# Patient Record
Sex: Female | Born: 1955 | Race: Black or African American | Hispanic: No | Marital: Single | Smoking: Never smoker
Health system: Southern US, Community
[De-identification: ages and names within clinical notes are randomized; demographics above are authoritative.]

## PROBLEM LIST (undated history)

## (undated) DIAGNOSIS — D649 Anemia, unspecified: Secondary | ICD-10-CM

## (undated) DIAGNOSIS — I10 Essential (primary) hypertension: Secondary | ICD-10-CM

## (undated) DIAGNOSIS — M8700 Idiopathic aseptic necrosis of unspecified bone: Secondary | ICD-10-CM

## (undated) DIAGNOSIS — Z85528 Personal history of other malignant neoplasm of kidney: Secondary | ICD-10-CM

## (undated) DIAGNOSIS — S301XXA Contusion of abdominal wall, initial encounter: Secondary | ICD-10-CM

## (undated) DIAGNOSIS — M329 Systemic lupus erythematosus, unspecified: Secondary | ICD-10-CM

## (undated) DIAGNOSIS — Z8719 Personal history of other diseases of the digestive system: Secondary | ICD-10-CM

## (undated) DIAGNOSIS — K439 Ventral hernia without obstruction or gangrene: Secondary | ICD-10-CM

## (undated) DIAGNOSIS — C642 Malignant neoplasm of left kidney, except renal pelvis: Secondary | ICD-10-CM

## (undated) DIAGNOSIS — N184 Chronic kidney disease, stage 4 (severe): Secondary | ICD-10-CM

## (undated) HISTORY — PX: SPLENECTOMY: SUR1306

## (undated) HISTORY — PX: MULTIPLE TOOTH EXTRACTIONS: SHX2053

## (undated) HISTORY — PX: NEPHRECTOMY: SHX65

---

## 2013-08-17 DIAGNOSIS — N183 Chronic kidney disease, stage 3 unspecified: Secondary | ICD-10-CM | POA: Diagnosis present

## 2013-09-01 DIAGNOSIS — G894 Chronic pain syndrome: Secondary | ICD-10-CM | POA: Diagnosis present

## 2013-10-30 ENCOUNTER — Encounter (HOSPITAL_BASED_OUTPATIENT_CLINIC_OR_DEPARTMENT_OTHER): Payer: Self-pay | Admitting: Emergency Medicine

## 2013-10-30 ENCOUNTER — Emergency Department (HOSPITAL_BASED_OUTPATIENT_CLINIC_OR_DEPARTMENT_OTHER)
Admission: EM | Admit: 2013-10-30 | Discharge: 2013-10-30 | Disposition: A | Discharge status: Home / Self Care | Payer: Medicare Other | Attending: Emergency Medicine | Admitting: Emergency Medicine

## 2013-10-30 ENCOUNTER — Emergency Department (HOSPITAL_BASED_OUTPATIENT_CLINIC_OR_DEPARTMENT_OTHER): Payer: Medicare Other

## 2013-10-30 DIAGNOSIS — Y929 Unspecified place or not applicable: Secondary | ICD-10-CM | POA: Diagnosis not present

## 2013-10-30 DIAGNOSIS — S46909A Unspecified injury of unspecified muscle, fascia and tendon at shoulder and upper arm level, unspecified arm, initial encounter: Secondary | ICD-10-CM | POA: Diagnosis present

## 2013-10-30 DIAGNOSIS — W010XXA Fall on same level from slipping, tripping and stumbling without subsequent striking against object, initial encounter: Secondary | ICD-10-CM | POA: Insufficient documentation

## 2013-10-30 DIAGNOSIS — Z862 Personal history of diseases of the blood and blood-forming organs and certain disorders involving the immune mechanism: Secondary | ICD-10-CM | POA: Insufficient documentation

## 2013-10-30 DIAGNOSIS — S4980XA Other specified injuries of shoulder and upper arm, unspecified arm, initial encounter: Secondary | ICD-10-CM | POA: Insufficient documentation

## 2013-10-30 DIAGNOSIS — Z8739 Personal history of other diseases of the musculoskeletal system and connective tissue: Secondary | ICD-10-CM | POA: Insufficient documentation

## 2013-10-30 DIAGNOSIS — Z79899 Other long term (current) drug therapy: Secondary | ICD-10-CM | POA: Insufficient documentation

## 2013-10-30 DIAGNOSIS — Y9389 Activity, other specified: Secondary | ICD-10-CM | POA: Diagnosis not present

## 2013-10-30 DIAGNOSIS — S42291A Other displaced fracture of upper end of right humerus, initial encounter for closed fracture: Secondary | ICD-10-CM

## 2013-10-30 DIAGNOSIS — S42293A Other displaced fracture of upper end of unspecified humerus, initial encounter for closed fracture: Secondary | ICD-10-CM | POA: Insufficient documentation

## 2013-10-30 DIAGNOSIS — I10 Essential (primary) hypertension: Secondary | ICD-10-CM | POA: Insufficient documentation

## 2013-10-30 HISTORY — DX: Anemia, unspecified: D64.9

## 2013-10-30 HISTORY — DX: Essential (primary) hypertension: I10

## 2013-10-30 HISTORY — DX: Systemic lupus erythematosus, unspecified: M32.9

## 2013-10-30 MED ORDER — OXYCODONE-ACETAMINOPHEN 5-325 MG PO TABS: 1.0000 | ORAL_TABLET | ORAL | Status: DC | PRN

## 2013-10-30 MED ORDER — OXYCODONE-ACETAMINOPHEN 5-325 MG PO TABS
1.0000 | ORAL_TABLET | Freq: Once | ORAL | Status: AC
  Administered 2013-10-30: 1 via ORAL
  Filled 2013-10-30: qty 1

## 2013-10-30 NOTE — Discharge Instructions (Signed)
Cryotherapy °Cryotherapy means treatment with cold. Ice or gel packs can be used to reduce both pain and swelling. Ice is the most helpful within the first 24 to 48 hours after an injury or flare-up from overusing a muscle or joint. Sprains, strains, spasms, burning pain, shooting pain, and aches can all be eased with ice. Ice can also be used when recovering from surgery. Ice is effective, has very few side effects, and is safe for most people to use. °PRECAUTIONS  °Ice is not a safe treatment option for people with: °· Raynaud phenomenon. This is a condition affecting small blood vessels in the extremities. Exposure to cold may cause your problems to return. °· Cold hypersensitivity. There are many forms of cold hypersensitivity, including: °¨ Cold urticaria. Red, itchy hives appear on the skin when the tissues begin to warm after being iced. °¨ Cold erythema. This is a red, itchy rash caused by exposure to cold. °¨ Cold hemoglobinuria. Red blood cells break down when the tissues begin to warm after being iced. The hemoglobin that carry oxygen are passed into the urine because they cannot combine with blood proteins fast enough. °· Numbness or altered sensitivity in the area being iced. °If you have any of the following conditions, do not use ice until you have discussed cryotherapy with your caregiver: °· Heart conditions, such as arrhythmia, angina, or chronic heart disease. °· High blood pressure. °· Healing wounds or open skin in the area being iced. °· Current infections. °· Rheumatoid arthritis. °· Poor circulation. °· Diabetes. °Ice slows the blood flow in the region it is applied. This is beneficial when trying to stop inflamed tissues from spreading irritating chemicals to surrounding tissues. However, if you expose your skin to cold temperatures for too long or without the proper protection, you can damage your skin or nerves. Watch for signs of skin damage due to cold. °HOME CARE INSTRUCTIONS °Follow  these tips to use ice and cold packs safely. °· Place a dry or damp towel between the ice and skin. A damp towel will cool the skin more quickly, so you may need to shorten the time that the ice is used. °· For a more rapid response, add gentle compression to the ice. °· Ice for no more than 10 to 20 minutes at a time. The bonier the area you are icing, the less time it will take to get the benefits of ice. °· Check your skin after 5 minutes to make sure there are no signs of a poor response to cold or skin damage. °· Rest 20 minutes or more between uses. °· Once your skin is numb, you can end your treatment. You can test numbness by very lightly touching your skin. The touch should be so light that you do not see the skin dimple from the pressure of your fingertip. When using ice, most people will feel these normal sensations in this order: cold, burning, aching, and numbness. °· Do not use ice on someone who cannot communicate their responses to pain, such as small children or people with dementia. °HOW TO MAKE AN ICE PACK °Ice packs are the most common way to use ice therapy. Other methods include ice massage, ice baths, and cryosprays. Muscle creams that cause a cold, tingly feeling do not offer the same benefits that ice offers and should not be used as a substitute unless recommended by your caregiver. °To make an ice pack, do one of the following: °· Place crushed ice or a   bag of frozen vegetables in a sealable plastic bag. Squeeze out the excess air. Place this bag inside another plastic bag. Slide the bag into a pillowcase or place a damp towel between your skin and the bag.  Mix 3 parts water with 1 part rubbing alcohol. Freeze the mixture in a sealable plastic bag. When you remove the mixture from the freezer, it will be slushy. Squeeze out the excess air. Place this bag inside another plastic bag. Slide the bag into a pillowcase or place a damp towel between your skin and the bag. SEEK MEDICAL CARE  IF:  You develop white spots on your skin. This may give the skin a blotchy (mottled) appearance.  Your skin turns blue or pale.  Your skin becomes waxy or hard.  Your swelling gets worse. MAKE SURE YOU:   Understand these instructions.  Will watch your condition.  Will get help right away if you are not doing well or get worse. Document Released: 09/23/2010 Document Revised: 06/13/2013 Document Reviewed: 09/23/2010 [facility] Patient Information 2015 [place], [place]. This information is not intended to replace advice given to you by your health care provider. Make sure you discuss any questions you have with your health care provider. Humerus Fracture, Treated with Immobilization The humerus is the large bone in your upper arm. You have a broken (fractured) humerus. These fractures are easily diagnosed with X-rays. TREATMENT  Simple fractures which will heal without disability are treated with simple immobilization. Immobilization means you will wear a cast, splint, or sling. You have a fracture which will do well with immobilization. The fracture will heal well simply by being held in a good position until it is stable enough to begin range of motion exercises. Do not take part in activities which would further injure your arm.  HOME CARE INSTRUCTIONS   Put ice on the injured area.  Put ice in a plastic bag.  Place a towel between your skin and the bag.  Leave the ice on for 15-20 minutes, 03-04 times a day.  If you have a cast:  Do not scratch the skin under the cast using sharp or pointed objects.  Check the skin around the cast every day. You may put lotion on any red or sore areas.  Keep your cast dry and clean.  If you have a splint:  Wear the splint as directed.  Keep your splint dry and clean.  You may loosen the elastic around the splint if your fingers become numb, tingle, or turn cold or blue.  If you have a sling:  Wear the sling as directed.  Do not put  pressure on any part of your cast or splint until it is fully hardened.  Your cast or splint can be protected during bathing with a plastic bag. Do not lower the cast or splint into water.  Only take over-the-counter or prescription medicines for pain, discomfort, or fever as directed by your caregiver.  Do range of motion exercises as instructed by your caregiver.  Follow up as directed by your caregiver. This is very important in order to avoid permanent injury or disability and chronic pain. SEEK IMMEDIATE MEDICAL CARE IF:   Your skin or nails in the injured arm turn blue or gray.  Your arm feels cold or numb.  You develop severe pain in the injured arm.  You are having problems with the medicines you were given. MAKE SURE YOU:   Understand these instructions.  Will watch your condition.  Will  get help right away if you are not doing well or get worse. Document Released: 05/05/2000 Document Revised: 04/21/2011 Document Reviewed: 03/13/2010 [facility] Patient Information 2015 [place], [place]. This information is not intended to replace advice given to you by your health care provider. Make sure you discuss any questions you have with your health care provider.

## 2013-10-30 NOTE — ED Notes (Addendum)
Patient reports that she tripped in doorway falling onto right side yesterday. Complains of right arm pain from shoulder to hand and right great toe pain

## 2013-10-30 NOTE — ED Provider Notes (Signed)
CSN: [id number]     Arrival date & time 10/30/13  1046 History   First MD Initiated Contact with Patient 10/30/13 1202     Chief Complaint  Patient presents with  . Fall     (Consider location/radiation/quality/duration/timing/severity/associated sxs/prior Treatment) Patient is a 58 y.o. female presenting with fall. The history is provided by the patient. No language interpreter was used.  Fall This is a new problem. The current episode started yesterday. Pertinent negatives include no abdominal pain, chest pain, chills, fever, headaches, nausea, neck pain, vomiting or weakness. Associated symptoms comments: She fell last night after tripping over an object in the floor, landing on her right shoulder. No head injury, neck pain, nausea or vomiting. She denies chest or abdominal pain. She feels sore over hip and knee on right but has been able to ambulate without difficulty. .    Past Medical History  Diagnosis Date  . Lupus   . Anemia   . Hypertension    History reviewed. No pertinent past surgical history. No family history on file. History  Substance Use Topics  . Smoking status: Never Smoker   . Smokeless tobacco: Not on file  . Alcohol Use: Not on file   OB History   Grav Para Term Preterm Abortions TAB SAB Ect Mult Living                 Review of Systems  Constitutional: Negative for fever and chills.  HENT: Negative.   Eyes: Negative for visual disturbance.  Respiratory: Negative.  Negative for shortness of breath.   Cardiovascular: Negative.  Negative for chest pain.  Gastrointestinal: Negative.  Negative for nausea, vomiting and abdominal pain.  Musculoskeletal: Negative for neck pain.       See HPI.  Skin: Negative.  Negative for wound.  Neurological: Negative.  Negative for weakness and headaches.      Allergies  Review of patient's allergies indicates no known allergies.  Home Medications   Prior to Admission medications   Medication Sig Start Date  End Date Taking? Authorizing Provider  oxyCODONE-acetaminophen (PERCOCET/ROXICET) 5-325 MG per tablet Take by mouth every 4 (four) hours as needed for severe pain.   Yes Historical Provider, MD  predniSONE (DELTASONE) 10 MG tablet Take 10 mg by mouth daily with breakfast.   Yes Historical Provider, MD  pregabalin (LYRICA) 150 MG capsule Take 150 mg by mouth 2 (two) times daily.   Yes Historical Provider, MD   BP 114/80  Pulse 110  Temp(Src) 97.9 F (36.6 C) (Oral)  Resp 24  Wt 240 lb (108.863 kg)  SpO2 97% Physical Exam  Constitutional: She is oriented to person, place, and time. She appears well-developed and well-nourished.  HENT:  Head: Normocephalic and atraumatic.  Eyes: Conjunctivae are normal.  Neck: Normal range of motion. Neck supple.  Cardiovascular: Normal rate and intact distal pulses.   Pulmonary/Chest: Effort normal. She exhibits no tenderness.  Abdominal: Soft. There is no tenderness.  Musculoskeletal:  No midline cervical tenderness.  Right shoulder: moderate tenderness without bony deformity that extends to mid upper arm. Elbow nontender, with FROM. Hip: no right hip or pelvic tenderness. FROM of right LE without deformity or bruising.   Neurological: She is alert and oriented to person, place, and time.  Skin: Skin is warm and dry.  Psychiatric: She has a normal mood and affect.    ED Course  Procedures (including critical care time) Labs Review Labs Reviewed - No data to display  Imaging Review  No results found.   EKG Interpretation None     Dg Shoulder Right  10/30/2013   CLINICAL DATA:  Status post fall yesterday now with right-sided shoulder pain  EXAM: RIGHT SHOULDER - 2+ VIEW  COMPARISON:  Right humeral series of today's date  FINDINGS: There is an acute mildly displaced fracture of the greater tuberosity of the humeral head. The glenohumeral joint and AC joint are intact. The observed portions of the scapula, right clavicle, and upper right ribs are  intact.  IMPRESSION: There is an acute mildly displaced fracture of the greater tuberosity of the right humeral head.   Electronically Signed   By: David  Martinique   On: 10/30/2013 13:02   Dg Humerus Right  10/30/2013   CLINICAL DATA:  Status post fall with right shoulder injury and pain radiating down the right humerus  EXAM: RIGHT HUMERUS - 2+ VIEW  COMPARISON:  Right shoulder series of today's date  FINDINGS: The humeral shaft is adequately mineralized. There is a known fracture of the greater tuberosity of the right humerus. The humeral shaft is unremarkable. The observed portions of the elbow are normal. The overlying soft tissues are unremarkable.  IMPRESSION: There is no acute abnormality of the shaft of the right humerus. There is a known fracture of the greater tuberosity.   Electronically Signed   By: David  Martinique   On: 10/30/2013 13:05   MDM   Final diagnoses:  None    1. Humeral head fracture  Fall with pain limited to shoulder with injury. No concern for other acute injury. Shoulder immobilized. Results discussed with patient along with care plan of ortho follow up.         ,  Dewaine Oats, PA-C 11/03/13 [phone]

## 2013-10-31 NOTE — ED Notes (Signed)
Pt called with questions regarding follow up. States she has not seen her orthopedist "in years" and wanted referral. Pt given contact info for Dr Sharol Given who was on call for orthopedics on 10/30/2013

## 2013-11-03 NOTE — ED Provider Notes (Signed)
Medical screening examination/treatment/procedure(s) were performed by non-physician practitioner and as supervising physician I was immediately available for consultation/collaboration.    Dorie Rank, MD 11/03/13 [phone]

## 2014-06-21 DIAGNOSIS — Z9081 Acquired absence of spleen: Secondary | ICD-10-CM | POA: Insufficient documentation

## 2014-06-21 DIAGNOSIS — C642 Malignant neoplasm of left kidney, except renal pelvis: Secondary | ICD-10-CM

## 2014-06-21 HISTORY — DX: Malignant neoplasm of left kidney, except renal pelvis: C64.2

## 2015-03-27 DIAGNOSIS — M329 Systemic lupus erythematosus, unspecified: Secondary | ICD-10-CM | POA: Diagnosis present

## 2015-08-03 DIAGNOSIS — N179 Acute kidney failure, unspecified: Secondary | ICD-10-CM | POA: Diagnosis present

## 2015-08-03 DIAGNOSIS — N183 Chronic kidney disease, stage 3 unspecified: Secondary | ICD-10-CM | POA: Diagnosis present

## 2015-10-13 ENCOUNTER — Emergency Department (HOSPITAL_BASED_OUTPATIENT_CLINIC_OR_DEPARTMENT_OTHER)
Admission: EM | Admit: 2015-10-13 | Discharge: 2015-10-13 | Disposition: A | Discharge status: Home / Self Care | Payer: Medicare HMO | Attending: Emergency Medicine | Admitting: Emergency Medicine

## 2015-10-13 ENCOUNTER — Emergency Department (HOSPITAL_BASED_OUTPATIENT_CLINIC_OR_DEPARTMENT_OTHER): Payer: Medicare HMO

## 2015-10-13 ENCOUNTER — Encounter (HOSPITAL_BASED_OUTPATIENT_CLINIC_OR_DEPARTMENT_OTHER): Payer: Self-pay

## 2015-10-13 DIAGNOSIS — K469 Unspecified abdominal hernia without obstruction or gangrene: Secondary | ICD-10-CM | POA: Diagnosis not present

## 2015-10-13 DIAGNOSIS — I10 Essential (primary) hypertension: Secondary | ICD-10-CM | POA: Insufficient documentation

## 2015-10-13 DIAGNOSIS — K439 Ventral hernia without obstruction or gangrene: Secondary | ICD-10-CM

## 2015-10-13 DIAGNOSIS — R109 Unspecified abdominal pain: Secondary | ICD-10-CM | POA: Diagnosis present

## 2015-10-13 HISTORY — DX: Personal history of other malignant neoplasm of kidney: Z85.528

## 2015-10-13 MED ORDER — OXYCODONE HCL 5 MG PO TABS
10.0000 mg | ORAL_TABLET | Freq: Once | ORAL | Status: AC
Start: 2015-10-13 — End: 2015-10-13
  Administered 2015-10-13: 10 mg via ORAL
  Filled 2015-10-13: qty 2

## 2015-10-13 NOTE — ED Provider Notes (Signed)
[facility] Provider Note   CSN: [id number] Arrival date & time: 10/13/15  1345     History   Chief Complaint Chief Complaint  Patient presents with  . Abdominal Pain    HPI Carolyn Zamora is a 59 y.o. female.  Patient with complicated past medical history including end-stage renal disease on dialysis once a week, left-sided nephrectomy and splenectomy, ventral hernia, lupus, chronic pain, bacteremia -- presents with complaint of swelling noted to the left anterior abdomen over the past 1 week, initially painless, now tender. She also complains of pain on the costal margin below her left breast without significant swelling, redness. Patient denies fever, vomiting, or diarrhea. No chest pain, cough, or shortness of breath. Patient has been using her chronic pain medications at home including oxycodone and Lyrica without relief. Onset of symptoms gradual. Course is worsening. Nothing makes symptoms better. Palpation makes the pain worse.      Past Medical History:  Diagnosis Date  . Anemia   . History of kidney cancer   . Hypertension   . Lupus ([place])     There are no active problems to display for this patient.   Past Surgical History:  Procedure Laterality Date  . MULTIPLE TOOTH EXTRACTIONS    . NEPHRECTOMY      OB History    No data available       Home Medications    Prior to Admission medications   Medication Sig Start Date End Date Taking? Authorizing Provider  oxyCODONE-acetaminophen (PERCOCET/ROXICET) 5-325 MG per tablet Take by mouth every 4 (four) hours as needed for severe pain.    Historical Provider, MD  oxyCODONE-acetaminophen (PERCOCET/ROXICET) 5-325 MG per tablet Take 1-2 tablets by mouth every 4 (four) hours as needed for severe pain. 10/30/13   Charlann Lange, PA-C  predniSONE (DELTASONE) 10 MG tablet Take 10 mg by mouth daily with breakfast.    Historical Provider, MD  pregabalin (LYRICA) 150 MG capsule Take 150 mg by mouth 2 (two)  times daily.    Historical Provider, MD    Family History No family history on file.  Social History Social History  Substance Use Topics  . Smoking status: Never Smoker  . Smokeless tobacco: Never Used  . Alcohol use No     Allergies   Review of patient's allergies indicates no known allergies.   Review of Systems Review of Systems  Constitutional: Negative for fever.  HENT: Negative for rhinorrhea and sore throat.   Eyes: Negative for redness.  Respiratory: Negative for cough.   Cardiovascular: Negative for chest pain.  Gastrointestinal: Positive for abdominal distention. Negative for diarrhea, nausea and vomiting.  Genitourinary: Negative for dysuria.  Musculoskeletal: Negative for myalgias.  Skin: Negative for rash.  Neurological: Negative for headaches.    Physical Exam Updated Vital Signs BP 115/79 (BP Location: Left Arm)   Pulse 94   Temp 98.2 F (36.8 C) (Oral)   Resp 18   Ht 5\' 2"  (1.575 m)   Wt 84.8 kg   SpO2 100%   BMI 34.20 kg/m   Physical Exam  Constitutional: She appears well-developed and well-nourished.  HENT:  Head: Normocephalic and atraumatic.  Eyes: Conjunctivae are normal. Right eye exhibits no discharge. Left eye exhibits no discharge.  Neck: Normal range of motion. Neck supple.  Cardiovascular: Normal rate, regular rhythm and normal heart sounds.   Pulmonary/Chest: Effort normal and breath sounds normal.  Abdominal: Soft. Bowel sounds are normal. There is tenderness in the left upper quadrant.  A hernia is present. Hernia confirmed positive in the ventral area.    Neurological: She is alert.  Skin: Skin is warm and dry.  Psychiatric: She has a normal mood and affect.  Nursing note and vitals reviewed.    ED Treatments / Results   Radiology Ct Abdomen Pelvis Wo Contrast  Result Date: 10/13/2015 CLINICAL DATA:  Painful left lateral ventral mass like area below the left breast. Patient noticed mass 2 days ago. EXAM: CT ABDOMEN AND  PELVIS WITHOUT CONTRAST TECHNIQUE: Multidetector CT imaging of the abdomen and pelvis was performed following the standard protocol without IV contrast. COMPARISON:  05/15/2015 CT FINDINGS: Lower chest: Small left pleural effusion is associated with atelectasis at the lung bases. A right central line tip extends to the right atrium. Hepatobiliary: No focal liver lesions.  The gallbladder is present. Pancreas: Normal in appearance. Spleen: Multiple small splenules identified in the left upper quadrant. Prior splenectomy. Renal/Adrenal: The adrenal glands are normal in appearance. Left kidney is absent. Right kidney is normal in appearance. No hydronephrosis or ureteral obstruction. Gastrointestinal tract: The stomach has a normal appearance. Small bowel loops are normal in appearance. There is a significant amount of stool throughout nondilated loops of colon. Reproductive/Pelvis: The uterus is enlarged and contains calcified fibroids. No adnexal mass. Vascular/Lymphatic: There is calcification of the abdominal aorta. No aneurysm. No retroperitoneal or mesenteric adenopathy. Musculoskeletal/Abdominal wall: There is a left anterior abdominal wall hernia which contains hazy fat and probably small amount of fluid. The herniated fat %period% measures approximately 3.9 x 8.7 x 6.5 cm. Today the herniated sac does not contain bowel loops as on prior studies. There is no associated bowel obstruction. There is 11 mm anterolisthesis L4 on L5. There is a right pars defect at L4. Other: none IMPRESSION: 1. Left anterior abdominal wall hernia containing strandy omental/ mesenteric fat and a small amount of fluid. Infarction of the related fat should be considered given the symptoms of pain. There is no bowel within the hernia on today's exam. 2. Status post splenectomy and left nephrectomy. 3. Small splenules in the left upper quadrant. 4.  Aortic atherosclerosis. 5. Anterolisthesis L4 on L5 with associated pars defect at L4.  Electronically Signed   By: Nolon Nations M.D.   On: 10/13/2015 16:07    Procedures Procedures (including critical care time)  Medications Ordered in ED Medications  oxyCODONE (Oxy IR/ROXICODONE) immediate release tablet 10 mg (10 mg Oral Given 10/13/15 1424)     Initial Impression / Assessment and Plan / ED Course  I have reviewed the triage vital signs and the nursing notes.  Pertinent labs & imaging results that were available during my care of the patient were reviewed by me and considered in my medical decision making (see chart for details).  Clinical Course  Comment By Time  Complicated medical history including lupus, recent nephrectomy within the last year according to the patient as well as a recent admission to the hospital where she was diagnosed with dialysis requiring renal failure. Shortly prior to that admission within the last month and a half she had been admitted to another hospital for the patient describes as bacteremia secondary to a dental infection for which she spent over 6 weeks in the hospital. She developed a small amount of infection in her right arm at her PICC line site which was removed. She has been keeping the stressed since that time. She now reports having some left-sided side pain over her anterior and lateral ribs  as well as her anterior lateral abdomen in the upper left abdomen. On exam the patient does have reproducible tenderness in this area. She has normal lung sounds, normal heart sounds, no tachycardia, otherwise the patient appears to be in no distress. She will need imaging of her abdomen upper abdomen and lower chest with CT scan to further evaluate the source of this pain. She does report that she had a fall however her pain has lasted long enough that this seems unusual and could possibly related to her prior nephrectomy or an infection. Noemi Chapel, MD 09/02 [phone]   Patient seen and examined. Discussed with Dr. Sabra Heck who has seen.    Vital  signs reviewed and are as follows: BP 129/67 (BP Location: Left Arm)   Pulse 75   Temp 98.2 F (36.8 C) (Oral)   Resp 18   Ht 5\' 2"  (1.575 m)   Wt 84.8 kg   SpO2 100%   BMI 34.20 kg/m   CT ordered to evaluate. Patient otherwise appears well.   Discussed CT findings with Dr. Sabra Heck. Pain may be caused by fat necrosis from hernia. No incarceration or strangulation. No other concerning findings in the abdomen.  Patient updated on findings. Will discharge to home and have patient follow-up with her primary care physician in the next week. We discussed signs and symptoms to return including a hard swollen, tender knot in the area of her hernia, vomiting, fevers, new symptoms or other concerns. Patient verbalizes understanding and agrees with plan. She will continue her home on chronic pain medications for treatment. Encouraged warmth on the area as well.     Final Clinical Impressions(s) / ED Diagnoses   Final diagnoses:  Ventral hernia without obstruction or gangrene   Patient with vague, poorly defined abdominal pain and left costal margin pain. Patient does have a reducible hernia demonstrated on CT scan. No incarceration or strength relation. No other intra-abdominal etiology visualized on exam or to imaging. Vital signs are within normal limits. Patient appears well, nontoxic. No further workup indicated. Encouraged PCP follow-up. Return instructions as above.  New Prescriptions New Prescriptions   No medications on file     Carlisle Cater, [place] 10/13/15 1648   Medical screening examination/treatment/procedure(s) were conducted as a shared visit with non-physician practitioner(s) and myself.  I personally evaluated the patient during the encounter.  See my docuemtnation within Mr. Nickola Major note  Clinical Impression:   Final diagnoses:  Ventral hernia without obstruction or gangrene         Noemi Chapel, MD 10/14/15 [phone]

## 2015-10-13 NOTE — ED Triage Notes (Signed)
C/o "knot" to abd x 1-2weeks-NAD-steady gait with own walker

## 2015-10-13 NOTE — Discharge Instructions (Signed)
Please read and follow all provided instructions.  Your diagnoses today include:  1. Ventral hernia without obstruction or gangrene     Tests performed today include:  CT of your abdomen and pelvis - shows hernia with inflamed fat, no infections or blockages  Vital signs. See below for your results today.   Medications prescribed:   None  Take any prescribed medications only as directed.  Home care instructions:  Follow any educational materials contained in this packet.  Use warmth on the area and use home pain medications to help control your symptoms.  Follow-up instructions: Please follow-up with your primary care provider in the next 3 days for further evaluation of your symptoms.   Return instructions:   Please return to the Emergency Department if you experience worsening symptoms.   Return with fever, vomiting, severe abdominal pain, blood passing in your stool, skin redness or warmth  Please return if you have any other emergent concerns.  Additional Information:  Your vital signs today were: BP 115/79 (BP Location: Left Arm)    Pulse 94    Temp 98.2 F (36.8 C) (Oral)    Resp 18    Ht 5\' 2"  (1.575 m)    Wt 84.8 kg    SpO2 100%    BMI 34.20 kg/m  If your blood pressure (BP) was elevated above 135/85 this visit, please have this repeated by your doctor within one month. --------------

## 2015-10-13 NOTE — ED Notes (Signed)
Pt states she forgot to tell triage RN that she is on dialysis.

## 2015-10-24 DIAGNOSIS — K439 Ventral hernia without obstruction or gangrene: Secondary | ICD-10-CM | POA: Insufficient documentation

## 2015-10-24 HISTORY — DX: Ventral hernia without obstruction or gangrene: K43.9

## 2015-11-16 HISTORY — PX: ORIF ANKLE FRACTURE: SUR919

## 2015-11-19 HISTORY — PX: INCISIONAL HERNIA REPAIR: SHX193

## 2015-12-27 ENCOUNTER — Observation Stay (HOSPITAL_COMMUNITY)
Admission: EM | Admit: 2015-12-27 | Discharge: 2015-12-28 | Disposition: A | Discharge status: Home / Self Care | Payer: Medicare HMO | Attending: Internal Medicine | Admitting: Internal Medicine

## 2015-12-27 ENCOUNTER — Encounter (HOSPITAL_COMMUNITY): Payer: Self-pay

## 2015-12-27 DIAGNOSIS — N184 Chronic kidney disease, stage 4 (severe): Secondary | ICD-10-CM

## 2015-12-27 DIAGNOSIS — N183 Chronic kidney disease, stage 3 unspecified: Secondary | ICD-10-CM | POA: Diagnosis present

## 2015-12-27 DIAGNOSIS — E875 Hyperkalemia: Secondary | ICD-10-CM | POA: Diagnosis not present

## 2015-12-27 DIAGNOSIS — N179 Acute kidney failure, unspecified: Secondary | ICD-10-CM | POA: Diagnosis not present

## 2015-12-27 DIAGNOSIS — R799 Abnormal finding of blood chemistry, unspecified: Secondary | ICD-10-CM | POA: Diagnosis present

## 2015-12-27 DIAGNOSIS — Z79899 Other long term (current) drug therapy: Secondary | ICD-10-CM | POA: Insufficient documentation

## 2015-12-27 DIAGNOSIS — M3214 Glomerular disease in systemic lupus erythematosus: Secondary | ICD-10-CM | POA: Diagnosis not present

## 2015-12-27 DIAGNOSIS — Z85528 Personal history of other malignant neoplasm of kidney: Secondary | ICD-10-CM | POA: Insufficient documentation

## 2015-12-27 DIAGNOSIS — I10 Essential (primary) hypertension: Secondary | ICD-10-CM | POA: Diagnosis not present

## 2015-12-27 DIAGNOSIS — R0602 Shortness of breath: Secondary | ICD-10-CM

## 2015-12-27 DIAGNOSIS — I129 Hypertensive chronic kidney disease with stage 1 through stage 4 chronic kidney disease, or unspecified chronic kidney disease: Secondary | ICD-10-CM | POA: Diagnosis not present

## 2015-12-27 DIAGNOSIS — L8990 Pressure ulcer of unspecified site, unspecified stage: Secondary | ICD-10-CM | POA: Insufficient documentation

## 2015-12-27 DIAGNOSIS — M329 Systemic lupus erythematosus, unspecified: Secondary | ICD-10-CM | POA: Diagnosis present

## 2015-12-27 DIAGNOSIS — N390 Urinary tract infection, site not specified: Secondary | ICD-10-CM | POA: Diagnosis present

## 2015-12-27 HISTORY — DX: Chronic kidney disease, stage 4 (severe): N18.4

## 2015-12-27 LAB — URINALYSIS, ROUTINE W REFLEX MICROSCOPIC
BILIRUBIN URINE: NEGATIVE
Glucose, UA: NEGATIVE mg/dL
Ketones, ur: NEGATIVE mg/dL
Nitrite: NEGATIVE
Protein, ur: NEGATIVE mg/dL
SPECIFIC GRAVITY, URINE: 1.008 (ref 1.005–1.030)
pH: 7 (ref 5.0–8.0)

## 2015-12-27 LAB — URINE MICROSCOPIC-ADD ON

## 2015-12-27 LAB — CBC
HCT: 36.9 % (ref 36.0–46.0)
Hemoglobin: 11.7 g/dL — ABNORMAL LOW (ref 12.0–15.0)
MCH: 23.8 pg — ABNORMAL LOW (ref 26.0–34.0)
MCHC: 31.7 g/dL (ref 30.0–36.0)
MCV: 75.2 fL — ABNORMAL LOW (ref 78.0–100.0)
Platelets: 186 10*3/uL (ref 150–400)
RBC: 4.91 MIL/uL (ref 3.87–5.11)
RDW: 17.7 % — ABNORMAL HIGH (ref 11.5–15.5)
WBC: 9.8 10*3/uL (ref 4.0–10.5)

## 2015-12-27 LAB — COMPREHENSIVE METABOLIC PANEL
ALT: 19 U/L (ref 14–54)
AST: 78 U/L — ABNORMAL HIGH (ref 15–41)
Albumin: 2.6 g/dL — ABNORMAL LOW (ref 3.5–5.0)
Alkaline Phosphatase: 66 U/L (ref 38–126)
Anion gap: 13 (ref 5–15)
BUN: 14 mg/dL (ref 6–20)
CO2: 23 mmol/L (ref 22–32)
CREATININE: 3.87 mg/dL — AB (ref 0.44–1.00)
Calcium: 7.4 mg/dL — ABNORMAL LOW (ref 8.9–10.3)
Chloride: 104 mmol/L (ref 101–111)
GFR calc Af Amer: 14 mL/min — ABNORMAL LOW (ref >60)
GFR, EST NON AFRICAN AMERICAN: 12 mL/min — AB (ref >60)
Glucose, Bld: 84 mg/dL (ref 65–99)
Potassium: 6.2 mmol/L — ABNORMAL HIGH (ref 3.5–5.1)
Sodium: 140 mmol/L (ref 135–145)
Total Bilirubin: <0.3 mg/dL — ABNORMAL LOW (ref 0.3–1.2)
Total Protein: 4.3 g/dL — ABNORMAL LOW (ref 6.5–8.1)

## 2015-12-27 LAB — I-STAT TROPONIN, ED: Troponin i, poc: 0.01 ng/mL (ref 0.00–0.08)

## 2015-12-27 MED ORDER — SODIUM CHLORIDE 0.9 % IV BOLUS (SEPSIS)
1000.0000 mL | Freq: Once | INTRAVENOUS | Status: AC
  Administered 2015-12-27: 1000 mL via INTRAVENOUS

## 2015-12-27 MED ORDER — HYDROXYZINE HCL 50 MG/ML IM SOLN: 25.0000 mg | Freq: Four times a day (QID) | INTRAMUSCULAR | Status: DC | PRN

## 2015-12-27 MED ORDER — HYDRALAZINE HCL 20 MG/ML IJ SOLN: 5.0000 mg | INTRAMUSCULAR | Status: DC | PRN

## 2015-12-27 MED ORDER — HYDROCORTISONE NA SUCCINATE PF 100 MG IJ SOLR
50.0000 mg | Freq: Once | INTRAMUSCULAR | Status: AC
  Administered 2015-12-27: 50 mg via INTRAVENOUS
  Filled 2015-12-27: qty 2

## 2015-12-27 MED ORDER — CEFTRIAXONE SODIUM 1 G IJ SOLR
1.0000 g | INTRAMUSCULAR | Status: DC
  Administered 2015-12-27: 1 g via INTRAVENOUS
  Filled 2015-12-27 (×2): qty 10

## 2015-12-27 MED ORDER — PREDNISONE 20 MG PO TABS
10.0000 mg | ORAL_TABLET | Freq: Every day | ORAL | Status: DC
  Administered 2015-12-28: 10 mg via ORAL
  Filled 2015-12-27: qty 1

## 2015-12-27 MED ORDER — HEPARIN SODIUM (PORCINE) 5000 UNIT/ML IJ SOLN
5000.0000 [IU] | Freq: Three times a day (TID) | INTRAMUSCULAR | Status: DC
  Administered 2015-12-27: 5000 [IU] via SUBCUTANEOUS
  Filled 2015-12-27: qty 1

## 2015-12-27 MED ORDER — DEXTROSE 50 % IV SOLN
INTRAVENOUS | Status: AC
  Filled 2015-12-27: qty 50

## 2015-12-27 MED ORDER — PREGABALIN 75 MG PO CAPS
150.0000 mg | ORAL_CAPSULE | Freq: Two times a day (BID) | ORAL | Status: DC
  Administered 2015-12-27 – 2015-12-28 (×2): 150 mg via ORAL
  Filled 2015-12-27 (×2): qty 2

## 2015-12-27 MED ORDER — SODIUM CHLORIDE 0.9% FLUSH
3.0000 mL | Freq: Two times a day (BID) | INTRAVENOUS | Status: DC
  Administered 2015-12-27: 3 mL via INTRAVENOUS

## 2015-12-27 MED ORDER — OXYCODONE-ACETAMINOPHEN 5-325 MG PO TABS
1.0000 | ORAL_TABLET | ORAL | Status: DC | PRN
  Administered 2015-12-27: 1 via ORAL
  Filled 2015-12-27: qty 1

## 2015-12-27 MED ORDER — SODIUM CHLORIDE 0.9 % IV SOLN
INTRAVENOUS | Status: DC
  Administered 2015-12-27: 22:00:00 via INTRAVENOUS

## 2015-12-27 MED ORDER — INSULIN ASPART 100 UNIT/ML ~~LOC~~ SOLN
5.0000 [IU] | Freq: Once | SUBCUTANEOUS | Status: AC
  Administered 2015-12-27: 5 [IU] via SUBCUTANEOUS
  Filled 2015-12-27: qty 1

## 2015-12-27 MED ORDER — DEXTROSE 50 % IV SOLN
50.0000 mL | Freq: Once | INTRAVENOUS | Status: AC
  Administered 2015-12-27: 50 mL via INTRAVENOUS

## 2015-12-27 MED ORDER — ZOLPIDEM TARTRATE 5 MG PO TABS
5.0000 mg | ORAL_TABLET | Freq: Every evening | ORAL | Status: DC | PRN
  Administered 2015-12-27: 5 mg via ORAL
  Filled 2015-12-27: qty 1

## 2015-12-27 MED ORDER — DEXTROSE 50 % IV SOLN: 50.0000 mL | INTRAVENOUS | Status: DC | PRN

## 2015-12-27 MED ORDER — SODIUM POLYSTYRENE SULFONATE 15 GM/60ML PO SUSP
45.0000 g | Freq: Once | ORAL | Status: AC
  Administered 2015-12-27: 45 g via ORAL
  Filled 2015-12-27: qty 180

## 2015-12-27 MED ORDER — ACETAMINOPHEN 325 MG PO TABS: 650.0000 mg | ORAL_TABLET | Freq: Four times a day (QID) | ORAL | Status: DC | PRN

## 2015-12-27 MED ORDER — ACETAMINOPHEN 650 MG RE SUPP: 650.0000 mg | Freq: Four times a day (QID) | RECTAL | Status: DC | PRN

## 2015-12-27 NOTE — ED Provider Notes (Signed)
[facility] DEPT Provider Note   CSN: [id number] Arrival date & time: 12/27/15  1811     History   Chief Complaint Chief Complaint  Patient presents with  . Abnormal Lab    HPI Carolyn Zamora is a 60 y.o. female with a hx of lupus, HTN, presents to the ED from her nursing home after she was found to have increased potassium of 7.0 on routine labs drawn yesterday. Additionally she states that she has been having waxing and waning fever and chills over the last 3 days, improved today, and fatigue. She denies any associated coryza, cough, dysuria, but does note a slight increase in her urinary urgency and frequency. She also notes a recent sick contact with her roommate. She denies any chest pain, palpitations, SOB.   HPI  Past Medical History:  Diagnosis Date  . Anemia   . CKD (chronic kidney disease), stage IV ([place])   . History of kidney cancer   . Hypertension   . Lupus     Patient Active Problem List   Diagnosis Date Noted  . Pressure injury of skin 12/28/2015  . Acute renal failure (ARF) ([place]) 12/27/2015  . UTI (urinary tract infection) 12/27/2015  . Hyperkalemia 12/27/2015  . Acute renal failure superimposed on stage 4 chronic kidney disease ([place]) 12/27/2015  . Hypertension   . Lupus     Past Surgical History:  Procedure Laterality Date  . MULTIPLE TOOTH EXTRACTIONS    . NEPHRECTOMY      OB History    No data available       Home Medications    Prior to Admission medications   Medication Sig Start Date End Date Taking? Authorizing Provider  HYDROcodone-acetaminophen (NORCO/VICODIN) 5-325 MG tablet Take 1 tablet by mouth every 8 (eight) hours as needed for pain. 12/15/15  Yes Historical Provider, MD  hydroxychloroquine (PLAQUENIL) 200 MG tablet Take 200 mg by mouth daily. 12/15/15  Yes Historical Provider, MD  levothyroxine (SYNTHROID, LEVOTHROID) 25 MCG tablet Take 25 mcg by mouth daily. 12/15/15 01/14/16 Yes Historical Provider, MD    oxyCODONE-acetaminophen (PERCOCET/ROXICET) 5-325 MG per tablet Take by mouth every 4 (four) hours as needed for severe pain.   Yes Historical Provider, MD  predniSONE (DELTASONE) 10 MG tablet Take 10 mg by mouth daily with breakfast.   Yes Historical Provider, MD  pregabalin (LYRICA) 150 MG capsule Take 150 mg by mouth 2 (two) times daily.   Yes Historical Provider, MD  cefUROXime (CEFTIN) 250 MG tablet Take 1 tablet (250 mg total) by mouth 2 (two) times daily with a meal. 12/28/15 01/01/16  Verlee Monte, MD  pregabalin (LYRICA) 50 MG capsule Take 50 mg by mouth daily. 12/15/15   Historical Provider, MD    Family History Family History  Problem Relation Age of Onset  . Hypertension Mother   . Diabetes Mellitus I Mother   . Lung cancer Father   . Hypertension Sister   . Colon cancer Brother     Social History Social History  Substance Use Topics  . Smoking status: Never Smoker  . Smokeless tobacco: Never Used  . Alcohol use No     Allergies   Patient has no known allergies.   Review of Systems Review of Systems  Constitutional: Positive for activity change, chills, fatigue and fever.  HENT: Negative for congestion, rhinorrhea, sinus pressure, sneezing and sore throat.   Respiratory: Negative for cough, chest tightness and shortness of breath.   Cardiovascular: Negative for chest pain, palpitations and leg  swelling.  Gastrointestinal: Negative for abdominal pain, diarrhea, nausea and vomiting.  Genitourinary: Positive for frequency and urgency. Negative for difficulty urinating, dysuria, flank pain and hematuria.  Musculoskeletal: Negative for back pain, myalgias and neck pain.  Skin: Negative for rash.  Neurological: Negative for syncope, weakness, light-headedness, numbness and headaches.  All other systems reviewed and are negative.    Physical Exam Updated Vital Signs BP (!) 143/92 (BP Location: Right Wrist)   Pulse 86   Temp 98 F (36.7 C) (Oral)   Resp 18   Ht  5\' 2"  (1.575 m)   Wt 91.6 kg   SpO2 100%   BMI 36.94 kg/m   Physical Exam  Constitutional: She is oriented to person, place, and time. She appears well-developed and well-nourished. No distress.  HENT:  Head: Normocephalic and atraumatic.  Nose: Nose normal.  Mouth/Throat: Oropharynx is clear and moist.  Eyes: Conjunctivae and EOM are normal. Pupils are equal, round, and reactive to light.  Neck: Neck supple.  Cardiovascular: Normal rate, regular rhythm, normal heart sounds and intact distal pulses.   Pulmonary/Chest: Effort normal and breath sounds normal. She exhibits no tenderness.  Abdominal: Soft. She exhibits no distension. There is no tenderness.  Neurological: She is alert and oriented to person, place, and time. No cranial nerve deficit or sensory deficit. Coordination normal.  Skin: Skin is warm and dry. No rash noted. She is not diaphoretic.  Nursing note and vitals reviewed.    ED Treatments / Results  Labs (all labs ordered are listed, but only abnormal results are displayed) Labs Reviewed  MRSA PCR SCREENING - Abnormal; Notable for the following:       Result Value   MRSA by PCR POSITIVE (*)    All other components within normal limits  CBC - Abnormal; Notable for the following:    Hemoglobin 11.7 (*)    MCV 75.2 (*)    MCH 23.8 (*)    RDW 17.7 (*)    All other components within normal limits  COMPREHENSIVE METABOLIC PANEL - Abnormal; Notable for the following:    Potassium 6.2 (*)    Creatinine, Ser 3.87 (*)    Calcium 7.4 (*)    Total Protein 4.3 (*)    Albumin 2.6 (*)    AST 78 (*)    Total Bilirubin <0.3 (*)    GFR calc non Af Amer 12 (*)    GFR calc Af Amer 14 (*)    All other components within normal limits  URINALYSIS, ROUTINE W REFLEX MICROSCOPIC (NOT AT [facility]) - Abnormal; Notable for the following:    Hgb urine dipstick SMALL (*)    Leukocytes, UA MODERATE (*)    All other components within normal limits  URINE MICROSCOPIC-ADD ON - Abnormal;  Notable for the following:    Squamous Epithelial / LPF 0-5 (*)    Bacteria, UA RARE (*)    Casts HYALINE CASTS (*)    All other components within normal limits  CORTISOL-AM, BLOOD - Abnormal; Notable for the following:    Cortisol - AM 37.5 (*)    All other components within normal limits  BASIC METABOLIC PANEL - Abnormal; Notable for the following:    Glucose, Bld 109 (*)    Creatinine, Ser 3.69 (*)    Calcium 7.5 (*)    GFR calc non Af Amer 13 (*)    GFR calc Af Amer 15 (*)    All other components within normal limits  CBC - Abnormal; Notable  for the following:    MCV 74.2 (*)    MCH 23.9 (*)    RDW 18.1 (*)    All other components within normal limits  URINE CULTURE  CREATININE, URINE, RANDOM  SODIUM, URINE, RANDOM  I-STAT TROPOININ, ED    EKG  EKG Interpretation  Date/Time:  Thursday December 27 2015 18:16:45 EST Ventricular Rate:  93 PR Interval:    QRS Duration: 59 QT Interval:  424 QTC Calculation: 528 R Axis:   17 Text Interpretation:  Sinus rhythm Low voltage, extremity leads Prolonged QT interval Confirmed by RAY MD, Andee Poles ([id number]) on 12/28/2015 2:27:51 PM       Radiology No results found.  Procedures Procedures (including critical care time)  Medications Ordered in ED Medications  oxyCODONE-acetaminophen (PERCOCET/ROXICET) 5-325 MG per tablet 1 tablet (1 tablet Oral Given 12/27/15 2337)  predniSONE (DELTASONE) tablet 10 mg (10 mg Oral Given 12/28/15 1033)  pregabalin (LYRICA) capsule 150 mg (150 mg Oral Given 12/28/15 1033)  0.9 %  sodium chloride infusion ( Intravenous New Bag/Given 12/27/15 2142)  heparin injection 5,000 Units (5,000 Units Subcutaneous Not Given 12/28/15 1400)  sodium chloride flush (NS) 0.9 % injection 3 mL (3 mLs Intravenous Not Given 12/28/15 1000)  acetaminophen (TYLENOL) tablet 650 mg (not administered)    Or  acetaminophen (TYLENOL) suppository 650 mg (not administered)  hydrOXYzine (VISTARIL) injection 25 mg (not  administered)  hydrALAZINE (APRESOLINE) injection 5 mg (not administered)  zolpidem (AMBIEN) tablet 5 mg (5 mg Oral Given 12/27/15 2337)  mupirocin ointment (BACTROBAN) 2 % 1 application (1 application Nasal Given 12/28/15 1032)  Chlorhexidine Gluconate Cloth 2 % PADS 6 each (not administered)  cefUROXime (CEFTIN) tablet 250 mg (250 mg Oral Given 12/28/15 1037)  sodium chloride 0.9 % bolus 1,000 mL (0 mLs Intravenous Stopped 12/27/15 2032)  sodium polystyrene (KAYEXALATE) 15 GM/60ML suspension 45 g (45 g Oral Given 12/27/15 2025)  insulin aspart (novoLOG) injection 5 Units (5 Units Subcutaneous Given 12/27/15 2025)  dextrose 50 % solution 50 mL (50 mLs Intravenous Given 12/27/15 2041)  hydrocortisone sodium succinate (SOLU-CORTEF) 100 MG injection 50 mg (50 mg Intravenous Given 12/27/15 2338)     Initial Impression / Assessment and Plan / ED Course  I have reviewed the triage vital signs and the nursing notes.  Pertinent labs & imaging results that were available during my care of the patient were reviewed by me and considered in my medical decision making (see chart for details).  Clinical Course    59 y.o. female presents for evaluation after she was found to have elevated K. Labs repeated here and were significant for K of 6.2 and Cr of 3.87. No previous Cr to compare to. Concern for acute renal failure with corresponding hyperkalemia. EKG shows no peaked T waves or concerning EKG changes from hyperkalemia. She was given IV fluid bolus. UA shows moderate leuks (6-30), small blood, rare bacteria, and nitrite neg.   She was then admitted to the hospitalist service for further care and assessment.  Final Clinical Impressions(s) / ED Diagnoses   Final diagnoses:  SOB (shortness of breath)  Essential hypertension  Systemic lupus erythematosus with glomerular disease, unspecified SLE type ([place])  AKI (acute kidney injury) ([place])    New Prescriptions Current Discharge Medication List      START taking these medications   Details  cefUROXime (CEFTIN) 250 MG tablet Take 1 tablet (250 mg total) by mouth 2 (two) times daily with a meal. Qty: 8 tablet, Refills: 0  Zenovia Jarred, DO 12/28/15 [phone]

## 2015-12-27 NOTE — H&P (Addendum)
History and Physical    Carolyn Zamora EHU:[id number] DOB: [birth year] DOA: 12/27/2015  Referring MD/NP/PA:   PCP: No PCP Per Patient   Patient coming from:  The patient is coming from SNF.  At baseline, pt is dependent for most of ADL.  Chief Complaint: intermittent fever, increased urinary frequency, hyperkalemia  HPI: Carolyn Zamora is a 60 y.o. female with medical history significant of lupus, kidney cancer (s/p of left nephrostomy, no chemotherapy or radiation therapy), anemia, CKD-IV, who presents with intermittent fever, increased urinary frequency and hyperkalemia.  Patient states that she has been having intermittent fever in the past several days. She also has increased urinary frequency, but no dysuria or burning on urination. She denies cough, chest pain or shortness breast. No nausea, vomiting, abdominal pain, diarrhea. Pt states that she had right foot fracture and underwent surgery one month ago. She has mild pain in that foot. She is taking percocet for pain. Pt had lab done in facility, was found to have K of 7.0, therefore was sent to ED.  ED Course: pt was found to have positive urinalysis with moderate amount of leukocyte, WBC 9.8, troponin negative, potassium 6.2 without T-wave peaking but showed QTC provocation, creatinine is 3.87 (no reflex creatinine available, temperature 99.3, oxygen saturation 95% on room air. Patient is placed on telemetry bed for observation.  Review of Systems:   General: has fevers, chills, no changes in body weight, has poor appetite, has fatigue HEENT: no blurry vision, hearing changes or sore throat Respiratory: no dyspnea, coughing, wheezing CV: no chest pain, no palpitations GI: no nausea, vomiting, abdominal pain, diarrhea, constipation GU: no dysuria, burning on urination, has increased urinary frequency, no hematuria  Ext: no leg edema Neuro: no unilateral weakness, numbness, or tingling, no vision change or hearing  loss Skin: no rash, no skin tear. MSK: No muscle spasm, no deformity, no limitation of range of movement in spin. Has right foot pain. Heme: No easy bruising.  Travel history: No recent long distant travel.  Allergy: No Known Allergies  Past Medical History:  Diagnosis Date  . Anemia   . CKD (chronic kidney disease), stage IV ([place])   . History of kidney cancer   . Hypertension   . Lupus     Past Surgical History:  Procedure Laterality Date  . MULTIPLE TOOTH EXTRACTIONS    . NEPHRECTOMY      Social History:  reports that she has never smoked. She has never used smokeless tobacco. She reports that she does not drink alcohol or use drugs.  Family History:  Family History  Problem Relation Age of Onset  . Hypertension Mother   . Diabetes Mellitus I Mother   . Lung cancer Father   . Hypertension Sister   . Colon cancer Brother      Prior to Admission medications   Medication Sig Start Date End Date Taking? Authorizing Provider  oxyCODONE-acetaminophen (PERCOCET/ROXICET) 5-325 MG per tablet Take by mouth every 4 (four) hours as needed for severe pain.    Historical Provider, MD  oxyCODONE-acetaminophen (PERCOCET/ROXICET) 5-325 MG per tablet Take 1-2 tablets by mouth every 4 (four) hours as needed for severe pain. 10/30/13   Charlann Lange, PA-C  predniSONE (DELTASONE) 10 MG tablet Take 10 mg by mouth daily with breakfast.    Historical Provider, MD  pregabalin (LYRICA) 150 MG capsule Take 150 mg by mouth 2 (two) times daily.    Historical Provider, MD    Physical Exam: Vitals:   12/27/15 2000  12/27/15 2015 12/27/15 2030 12/27/15 2045  BP: 133/73 116/78 139/86 (!) 135/108  Pulse: 89 91 93 96  Resp: 18 15 19 23   Temp:      TempSrc:      SpO2: 96% 97% 96% 92%   General: Not in acute distress HEENT:       Eyes: PERRL, EOMI, no scleral icterus.       ENT: No discharge from the ears and nose, no pharynx injection, no tonsillar enlargement.        Neck: No JVD, no bruit, no  mass felt. Heme: No neck lymph node enlargement. Cardiac: S1/S2, RRR, No murmurs, No gallops or rubs. Respiratory: No rales, wheezing, rhonchi or rubs. GI: Soft, nondistended, nontender, no rebound pain, no organomegaly, BS present. GU: No hematuria Ext: No pitting leg edema bilaterally. 2+DP/PT pulse bilaterally. Musculoskeletal: No joint deformities, No joint redness or warmth, no limitation of ROM in spin. Right foot is wrap without any drainage. Skin: No rashes.  Neuro: Alert, oriented X3, cranial nerves II-XII grossly intact, moves all extremities normally. Psych: Patient is not psychotic, no suicidal or hemocidal ideation.  Labs on Admission: I have personally reviewed following labs and imaging studies  CBC:  Recent Labs Lab 12/27/15 1808  WBC 9.8  HGB 11.7*  HCT 36.9  MCV 75.2*  PLT 128   Basic Metabolic Panel:  Recent Labs Lab 12/27/15 1808  NA 140  K 6.2*  CL 104  CO2 23  GLUCOSE 84  BUN 14  CREATININE 3.87*  CALCIUM 7.4*   GFR: CrCl cannot be calculated (Unknown ideal weight.). Liver Function Tests:  Recent Labs Lab 12/27/15 1808  AST 78*  ALT 19  ALKPHOS 66  BILITOT <0.3*  PROT 4.3*  ALBUMIN 2.6*   No results for input(s): LIPASE, AMYLASE in the last 168 hours. No results for input(s): AMMONIA in the last 168 hours. Coagulation Profile: No results for input(s): INR, PROTIME in the last 168 hours. Cardiac Enzymes: No results for input(s): CKTOTAL, CKMB, CKMBINDEX, TROPONINI in the last 168 hours. BNP (last 3 results) No results for input(s): PROBNP in the last 8760 hours. HbA1C: No results for input(s): HGBA1C in the last 72 hours. CBG: No results for input(s): GLUCAP in the last 168 hours. Lipid Profile: No results for input(s): CHOL, HDL, LDLCALC, TRIG, CHOLHDL, LDLDIRECT in the last 72 hours. Thyroid Function Tests: No results for input(s): TSH, T4TOTAL, FREET4, T3FREE, THYROIDAB in the last 72 hours. Anemia Panel: No results for  input(s): VITAMINB12, FOLATE, FERRITIN, TIBC, IRON, RETICCTPCT in the last 72 hours. Urine analysis:    Component Value Date/Time   COLORURINE YELLOW 12/27/2015 1900   APPEARANCEUR CLEAR 12/27/2015 1900   LABSPEC 1.008 12/27/2015 1900   PHURINE 7.0 12/27/2015 1900   GLUCOSEU NEGATIVE 12/27/2015 1900   HGBUR SMALL (A) 12/27/2015 1900   BILIRUBINUR NEGATIVE 12/27/2015 1900   KETONESUR NEGATIVE 12/27/2015 1900   PROTEINUR NEGATIVE 12/27/2015 1900   NITRITE NEGATIVE 12/27/2015 1900   LEUKOCYTESUR MODERATE (A) 12/27/2015 1900   Sepsis Labs: @LABRCNTIP (procalcitonin:4,lacticidven:4) )No results found for this or any previous visit (from the past 240 hour(s)).   Radiological Exams on Admission: No results found.   EKG: Independently reviewed. Sinus rhythm, QTC 528, low voltage, anteroseptal infarction pattern.  Assessment/Plan Principal Problem:   Hyperkalemia Active Problems:   UTI (urinary tract infection)   Hypertension   Lupus   Acute renal failure superimposed on stage 4 chronic kidney disease (HCC)   Hyperkalemia: K=6.2, has QT prolongation, but  no T-wave peaking. -will place on tele bed for obs -Novology 5 U, D50 50 ml, and Kayexalate 45 g 1 -f/u by BMP  UTI: -IV rocephin -f/u Bx and Ux  Hypertension: bp 117/105. Not taking med at home -IV Hydralazine when necessary  Lupus: stable. -continue home dose of prednisone, 10n mg daily. -Solu Cortef 50 mg 1 as stress dose -Check cortisol level in morning  AoCKD-IV: Baseline Cre is 2.0~3.0, pt's Cre is 3.87 on admission. Likely due to UTI and prerenal secondary to dehydration - IVF: 1L NS in ED, then 75 cc/h - Check FeNa - Follow up renal function by BMP  Recent right foot bone fracture: s/p of surgery. -prn percocet for pain  DVT ppx: SQ Heparin   Code Status: Full code Family Communication:  Yes, patient's sister and daughter at bed side Disposition Plan:  Anticipate discharge back to previous SNF  environment Consults called:  none Admission status: Obs / tele    Date of Service 12/27/2015    Azoria Abbett, [facility] Pager [phone]  If 7PM-7AM, please contact night-coverage [email or web address] Password [email or web address] 12/27/2015, 10:08 PM

## 2015-12-27 NOTE — Progress Notes (Signed)
New Admission Note:  Pt. Admitted to 6E02 from the [facility] Arrival Method: via Stretcher Mental Orientation: Alert and Oriented x 4 Telemetry: Placed on box 6E02 Assessment: Completed Skin: Stage II bilateral buttocks in addition to excoriated groin IV: L fa SL Pain: Denies Tubes: None Safety Measures: Safety Fall Prevention Plan has been discussed  Admission: To be completed 6 [place] Orientation: Patient has been orientated to the room, unit and staff.  Family: None at bedside at this time  Orders to be reviewed and implemented. Will continue to monitor the patient. Call light has been placed within reach and bed alarm has been activated.   Mady Gemma, BSN, RN-BC Phone: [phone]

## 2015-12-27 NOTE — ED Notes (Signed)
Dr. Blaine Hamper ( admitting MD ) evaluated pt. and explained admission plan to pt. and family .

## 2015-12-27 NOTE — ED Provider Notes (Signed)
I saw and evaluated the patient, reviewed the resident's note and I agree with the findings and plan.   EKG Interpretation  Date/Time:  Thursday December 27 2015 18:16:45 EST Ventricular Rate:  93 PR Interval:    QRS Duration: 59 QT Interval:  424 QTC Calculation: 528 R Axis:   17 Text Interpretation:  Sinus rhythm Low voltage, extremity leads Prolonged QT interval Confirmed by Zenia Resides  MD, Isay Perleberg ([id number]) on 12/27/2015 7:02:33 PM     Patient here from nursing home complaining of increased potassium. Patient has no symptoms at this time. With exception of some urinary symptoms. Will check UA. A repeat potassium.   Lacretia Leigh, MD 12/27/15 [phone]

## 2015-12-27 NOTE — ED Triage Notes (Signed)
Per EMS - pt from [facility]. Pt had labs drawn yesterday, told today that potassium was 7 and to come to ED. Reports intermittent fevers controlled by Tylenol x 3 days. Pt normal temp 99.3 per facility

## 2015-12-28 DIAGNOSIS — N179 Acute kidney failure, unspecified: Secondary | ICD-10-CM | POA: Diagnosis not present

## 2015-12-28 DIAGNOSIS — I10 Essential (primary) hypertension: Secondary | ICD-10-CM | POA: Diagnosis not present

## 2015-12-28 DIAGNOSIS — M3214 Glomerular disease in systemic lupus erythematosus: Secondary | ICD-10-CM | POA: Diagnosis not present

## 2015-12-28 DIAGNOSIS — E875 Hyperkalemia: Secondary | ICD-10-CM | POA: Diagnosis not present

## 2015-12-28 DIAGNOSIS — L8990 Pressure ulcer of unspecified site, unspecified stage: Secondary | ICD-10-CM | POA: Insufficient documentation

## 2015-12-28 DIAGNOSIS — N3000 Acute cystitis without hematuria: Secondary | ICD-10-CM

## 2015-12-28 LAB — CBC
HCT: 37.6 % (ref 36.0–46.0)
HEMOGLOBIN: 12.1 g/dL (ref 12.0–15.0)
MCH: 23.9 pg — AB (ref 26.0–34.0)
MCHC: 32.2 g/dL (ref 30.0–36.0)
MCV: 74.2 fL — ABNORMAL LOW (ref 78.0–100.0)
Platelets: 170 10*3/uL (ref 150–400)
RBC: 5.07 MIL/uL (ref 3.87–5.11)
RDW: 18.1 % — ABNORMAL HIGH (ref 11.5–15.5)
WBC: 8 10*3/uL (ref 4.0–10.5)

## 2015-12-28 LAB — BASIC METABOLIC PANEL
ANION GAP: 12 (ref 5–15)
BUN: 14 mg/dL (ref 6–20)
CALCIUM: 7.5 mg/dL — AB (ref 8.9–10.3)
CO2: 23 mmol/L (ref 22–32)
Chloride: 107 mmol/L (ref 101–111)
Creatinine, Ser: 3.69 mg/dL — ABNORMAL HIGH (ref 0.44–1.00)
GFR, EST AFRICAN AMERICAN: 15 mL/min — AB (ref >60)
GFR, EST NON AFRICAN AMERICAN: 13 mL/min — AB (ref >60)
Glucose, Bld: 109 mg/dL — ABNORMAL HIGH (ref 65–99)
Potassium: 4.5 mmol/L (ref 3.5–5.1)
Sodium: 142 mmol/L (ref 135–145)

## 2015-12-28 LAB — CORTISOL-AM, BLOOD: Cortisol - AM: 37.5 ug/dL — ABNORMAL HIGH (ref 6.7–22.6)

## 2015-12-28 LAB — SODIUM, URINE, RANDOM: SODIUM UR: 129 mmol/L

## 2015-12-28 LAB — CREATININE, URINE, RANDOM: Creatinine, Urine: 25.85 mg/dL

## 2015-12-28 LAB — MRSA PCR SCREENING: MRSA BY PCR: POSITIVE — AB

## 2015-12-28 MED ORDER — CHLORHEXIDINE GLUCONATE CLOTH 2 % EX PADS: 6.0000 | MEDICATED_PAD | Freq: Every day | CUTANEOUS | Status: DC

## 2015-12-28 MED ORDER — MUPIROCIN 2 % EX OINT
1.0000 "application " | TOPICAL_OINTMENT | Freq: Two times a day (BID) | CUTANEOUS | Status: DC
  Administered 2015-12-28: 1 via NASAL
  Filled 2015-12-28: qty 22

## 2015-12-28 MED ORDER — CEFUROXIME AXETIL 250 MG PO TABS
250.0000 mg | ORAL_TABLET | Freq: Two times a day (BID) | ORAL | 0 refills | Status: AC
Start: 2015-12-28 — End: 2016-01-01

## 2015-12-28 MED ORDER — CEFUROXIME AXETIL 500 MG PO TABS
250.0000 mg | ORAL_TABLET | Freq: Two times a day (BID) | ORAL | Status: DC
  Administered 2015-12-28: 250 mg via ORAL
  Filled 2015-12-28: qty 1

## 2015-12-28 NOTE — Care Management Note (Signed)
Case Management Note  Patient Details  Name: Carolyn Zamora MRN: [id number] Date of Birth: 01/10/1956  Subjective/Objective:     Fever, UTI               Action/Plan: Discharge Planning: AVS reviewed:   NCM spoke to pt at bedside. States she lives at home with dtr, Greggory Stallion. States her grandson is also able to assist her at home. Pt has wheelchair, RW and bedside commode at home. States she only had 1 day left at SNF and wants to go home. HH PT recommended. Pt states at this time she does to want [facility].   PCP - Beckie Salts MD  Expected Discharge Date:  12/28/2015               Expected Discharge Plan:  [place]  In-House Referral:  NA  Discharge planning Services  CM Consult  Post Acute Care Choice:  NA Choice offered to:  NA  DME Arranged:  N/A DME Agency:  NA  HH Arranged:  Patient Refused [place] Agency:  NA  Status of Service:  Completed, signed off  If discussed at [place] of Stay Meetings, dates discussed:    Additional Comments:  Erenest Rasher, RN 12/28/2015, 1:18 PM

## 2015-12-28 NOTE — Progress Notes (Signed)
Patient discharged to home with daughter. Discharge instructions reviewed. Medications reviewed. Followup appts reviewed. IV removed. Telemetry removed. Patients belongings with patient. Patient left the unit in wheelchair.   Sheliah Plane RN

## 2015-12-28 NOTE — Care Management Obs Status (Addendum)
[facility] NOTIFICATION   Patient Details  Name: Carolyn Zamora MRN: [id number] Date of Birth: 09-Jun-1955   Medicare Observation Status Notification Given:  Yes Notification reviewed with pt. Left copy in room. Contact Precautions   Erenest Rasher, RN 12/28/2015, 1:22 PM

## 2015-12-28 NOTE — Evaluation (Signed)
Physical Therapy Evaluation Patient Details Name: Carolyn Zamora MRN: [id number] DOB: December 01, 1955 Today's Date: 12/28/2015   History of Present Illness   60 y.o. female with medical history significant of lupus, kidney cancer (s/p of left nephrostomy, no chemotherapy or radiation therapy), anemia, CKD-IV, who presents with intermittent fever, increased urinary frequency and hyperkalemia. H/o R foot fx 2 months ago per pt. Pt has been NWB for 2 months at SNF.   Clinical Impression  Pt is independent with transfers and stated she's been independent with WC mobility, she'll have 24* assist at home. She has needed DME. She plans to see orthopedic Dr regarding her foot fx soon. HHPT recommended for gait training once pt is allowed to weightbear on RLE. From PT standpoint she is ready to DC home.     Follow Up Recommendations Home health PT    Equipment Recommendations  None recommended by PT    Recommendations for Other Services       Precautions / Restrictions Precautions Precautions: Fall Restrictions Weight Bearing Restrictions: Yes RLE Weight Bearing: Non weight bearing      Mobility  Bed Mobility Overal bed mobility: Modified Independent             General bed mobility comments: with rail, independent supine to sit and with scooting up in bed  Transfers Overall transfer level: Modified independent Equipment used: None             General transfer comment: SPT from bed to [facility] then back to bed, used armrests of BSC, NWB RLE, no LOB  Ambulation/Gait             General Gait Details: pt is NWB RLE  Stairs            Wheelchair Mobility    Modified Rankin (Stroke Patients Only)       Balance Overall balance assessment: Independent (in sitting)                                           Pertinent Vitals/Pain Pain Assessment: No/denies pain    Home Living Family/patient expects to be discharged to:: Private  residence Living Arrangements: Children;Other relatives Available Help at Discharge: Family;Available 24 hours/day Type of Home: Apartment Home Access: Level entry     Home Layout: One level Home Equipment: Walker - 4 wheels;Wheelchair - manual;Cane - single point;Bedside commode      Prior Function Level of Independence: Independent with assistive device(s)         Comments: has been independent with WC transfers at SNF, and with self propelling WC     Hand Dominance        Extremity/Trunk Assessment               Lower Extremity Assessment: RLE deficits/detail RLE Deficits / Details: ankle in soft cast, SLR at least 3/5, sensation intact to light touch at toes    Cervical / Trunk Assessment: Normal  Communication   Communication: No difficulties  Cognition Arousal/Alertness: Awake/alert Behavior During Therapy: WFL for tasks assessed/performed Overall Cognitive Status: Within Functional Limits for tasks assessed                      General Comments      Exercises     Assessment/Plan    PT Assessment All further PT needs can be met in the next venue  of care  PT Problem List Decreased mobility          PT Treatment Interventions      PT Goals (Current goals can be found in the Care Plan section)  Acute Rehab PT Goals Patient Stated Goal: to DC home for her birthday and THanksgiving PT Goal Formulation: All assessment and education complete, DC therapy    Frequency     Barriers to discharge        Co-evaluation               End of Session Equipment Utilized During Treatment: Gait belt Activity Tolerance: Patient tolerated treatment well Patient left: in bed;with call bell/phone within reach Nurse Communication: Mobility status    Functional Assessment Tool Used: clinical judgement Functional Limitation: Mobility: Walking and moving around Mobility: Walking and Moving Around Current Status (S0630): At least 1 percent but  less than 20 percent impaired, limited or restricted Mobility: Walking and Moving Around Goal Status [phone]): At least 1 percent but less than 20 percent impaired, limited or restricted Mobility: Walking and Moving Around Discharge Status [phone]): At least 1 percent but less than 20 percent impaired, limited or restricted    Time: 1152-1210 PT Time Calculation (min) (ACUTE ONLY): 18 min   Charges:   PT Evaluation $PT Eval Low Complexity: 1 Procedure     PT G Codes:   PT G-Codes **NOT FOR INPATIENT CLASS** Functional Assessment Tool Used: clinical judgement Functional Limitation: Mobility: Walking and moving around Mobility: Walking and Moving Around Current Status (T7322): At least 1 percent but less than 20 percent impaired, limited or restricted Mobility: Walking and Moving Around Goal Status [phone]): At least 1 percent but less than 20 percent impaired, limited or restricted Mobility: Walking and Moving Around Discharge Status [phone]): At least 1 percent but less than 20 percent impaired, limited or restricted    Philomena Doheny 12/28/2015, 12:21 PM [phone]

## 2015-12-28 NOTE — Discharge Summary (Signed)
Physician Discharge Summary  Carolyn Zamora DVV:[id number] DOB: 1955-04-07 DOA: 12/27/2015  PCP: No PCP Per Patient  Admit date: 12/27/2015 Discharge date: 12/28/2015  Admitted From: SNF Disposition: Home  Recommendations for Outpatient Follow-up:  1. Follow up with PCP in 1-2 weeks 2. Please obtain BMP/CBC in one week 3. Ceftin for 4 more days to complete total of 5 days  Home Health: NA Equipment/Devices:NA  Discharge Condition: Stable CODE STATUS: Full Code Diet recommendation: Diet Heart Room service appropriate? Yes; Fluid consistency: Thin  Brief/Interim Summary: Carolyn Zamora is a 60 y.o. female with medical history significant of lupus, kidney cancer (s/p of left nephrostomy, no chemotherapy or radiation therapy), anemia, CKD-IV, who presents with intermittent fever, increased urinary frequency and hyperkalemia.  Patient states that she has been having intermittent fever in the past several days. She also has increased urinary frequency, but no dysuria or burning on urination. She denies cough, chest pain or shortness breast. No nausea, vomiting, abdominal pain, diarrhea. Pt states that she had right foot fracture and underwent surgery one month ago. She has mild pain in that foot. She is taking percocet for pain. Pt had lab done in facility, was found to have K of 7.0, therefore was sent to ED.  Discharge Diagnoses:  Principal Problem:   Hyperkalemia Active Problems:   UTI (urinary tract infection)   Hypertension   Lupus   Acute renal failure superimposed on stage 4 chronic kidney disease (HCC)   Pressure injury of skin   Hyperkalemia: K=6.2, has QT prolongation, but no T-wave peaking. -will place on tele bed for obs -Novology 5 U, D50 50 ml, and Kayexalate 45 g 1 -Potassium this morning is 4.5 within normal limits.  UTI: -Urinalysis consistent with UTI, Rocephin started in the ED. She has to be discharged home. Cultures pending. -Discharged on Ceftin  to complete 5 days of antibiotics.  Hypertension: bp 117/105. Not taking med at home -IV Hydralazine when necessary  Lupus: stable. -continue home dose of prednisone, 10n mg daily. -Solu Cortef 50 mg 1 as stress dose -This is been stable problem.  Acute renal failure on chronic kidney disease stage 3-4 -Baseline Cre is 2.0~3.0, pt's Cre is 3.87 on admission. Likely due to UTI and prerenal secondary to dehydration -Given IV fluids, creatinine is 3.6 today. -She is not on diuretics at home, please follow with her primary nephrologist within 1 week.  Recent right foot bone fracture: s/p of surgery. -prn percocet for pain -Patient was not able to ambulate, reports uses wheelchair.  Disposition -Patient admitted to the hospital from skilled nursing facility, I want to get her back to the SNF today. -Patient refused and she wanted to be discharged home, reportedly she was about to be discharged from the SNF.   Discharge Instructions     Medication List    TAKE these medications   cefUROXime 250 MG tablet Commonly known as:  CEFTIN Take 1 tablet (250 mg total) by mouth 2 (two) times daily with a meal.   oxyCODONE-acetaminophen 5-325 MG tablet Commonly known as:  PERCOCET/ROXICET Take by mouth every 4 (four) hours as needed for severe pain.   oxyCODONE-acetaminophen 5-325 MG tablet Commonly known as:  PERCOCET/ROXICET Take 1-2 tablets by mouth every 4 (four) hours as needed for severe pain.   predniSONE 10 MG tablet Commonly known as:  DELTASONE Take 10 mg by mouth daily with breakfast.   pregabalin 150 MG capsule Commonly known as:  LYRICA Take 150 mg by mouth 2 (two) times  daily.       No Known Allergies  Consultations:  None   Procedures (Echo, Carotid, EGD, Colonoscopy, ERCP)   Radiological studies:  No results found.  Subjective:  Discharge Exam: Vitals:   12/27/15 2045 12/27/15 2130 12/28/15 0537 12/28/15 1000  BP: (!) 135/108 140/87 132/82  (!) 143/92  Pulse: 96 (!) 108 84 86  Resp: 23 18 18 18   Temp:  98.3 F (36.8 C) 98.1 F (36.7 C) 98 F (36.7 C)  TempSrc:  Oral Oral Oral  SpO2: 92% 96% 98% 100%  Weight:  91.6 kg (201 lb 15.1 oz)    Height:  5\' 2"  (1.575 m)     General: Pt is alert, awake, not in acute distress Cardiovascular: RRR, S1/S2 +, no rubs, no gallops Respiratory: CTA bilaterally, no wheezing, no rhonchi Abdominal: Soft, NT, ND, bowel sounds + Extremities: no edema, no cyanosis   The results of significant diagnostics from this hospitalization (including imaging, microbiology, ancillary and laboratory) are listed below for reference.    Microbiology: Recent Results (from the past 240 hour(s))  MRSA PCR Screening     Status: Abnormal   Collection Time: 12/27/15 11:30 PM  Result Value Ref Range Status   MRSA by PCR POSITIVE (A) NEGATIVE Final    Comment:        The GeneXpert MRSA Assay (FDA approved for NASAL specimens only), is one component of a comprehensive MRSA colonization surveillance program. It is not intended to diagnose MRSA infection nor to guide or monitor treatment for MRSA infections. RESULT CALLED TO, READ BACK BY AND VERIFIED WITH: K MOORE,RN @0617  12/28/15 MKELLY,MLT      Labs: BNP (last 3 results) No results for input(s): BNP in the last 8760 hours. Basic Metabolic Panel:  Recent Labs Lab 12/27/15 1808 12/28/15 0457  NA 140 142  K 6.2* 4.5  CL 104 107  CO2 23 23  GLUCOSE 84 109*  BUN 14 14  CREATININE 3.87* 3.69*  CALCIUM 7.4* 7.5*   Liver Function Tests:  Recent Labs Lab 12/27/15 1808  AST 78*  ALT 19  ALKPHOS 66  BILITOT <0.3*  PROT 4.3*  ALBUMIN 2.6*   No results for input(s): LIPASE, AMYLASE in the last 168 hours. No results for input(s): AMMONIA in the last 168 hours. CBC:  Recent Labs Lab 12/27/15 1808 12/28/15 0457  WBC 9.8 8.0  HGB 11.7* 12.1  HCT 36.9 37.6  MCV 75.2* 74.2*  PLT 186 170   Cardiac Enzymes: No results for input(s):  CKTOTAL, CKMB, CKMBINDEX, TROPONINI in the last 168 hours. BNP: Invalid input(s): POCBNP CBG: No results for input(s): GLUCAP in the last 168 hours. D-Dimer No results for input(s): DDIMER in the last 72 hours. Hgb A1c No results for input(s): HGBA1C in the last 72 hours. Lipid Profile No results for input(s): CHOL, HDL, LDLCALC, TRIG, CHOLHDL, LDLDIRECT in the last 72 hours. Thyroid function studies No results for input(s): TSH, T4TOTAL, T3FREE, THYROIDAB in the last 72 hours.  Invalid input(s): FREET3 Anemia work up No results for input(s): VITAMINB12, FOLATE, FERRITIN, TIBC, IRON, RETICCTPCT in the last 72 hours. Urinalysis    Component Value Date/Time   COLORURINE YELLOW 12/27/2015 1900   APPEARANCEUR CLEAR 12/27/2015 1900   LABSPEC 1.008 12/27/2015 1900   PHURINE 7.0 12/27/2015 1900   GLUCOSEU NEGATIVE 12/27/2015 1900   HGBUR SMALL (A) 12/27/2015 1900   BILIRUBINUR NEGATIVE 12/27/2015 1900   KETONESUR NEGATIVE 12/27/2015 1900   PROTEINUR NEGATIVE 12/27/2015 1900   NITRITE NEGATIVE  12/27/2015 1900   LEUKOCYTESUR MODERATE (A) 12/27/2015 1900   Sepsis Labs Invalid input(s): PROCALCITONIN,  WBC,  LACTICIDVEN Microbiology Recent Results (from the past 240 hour(s))  MRSA PCR Screening     Status: Abnormal   Collection Time: 12/27/15 11:30 PM  Result Value Ref Range Status   MRSA by PCR POSITIVE (A) NEGATIVE Final    Comment:        The GeneXpert MRSA Assay (FDA approved for NASAL specimens only), is one component of a comprehensive MRSA colonization surveillance program. It is not intended to diagnose MRSA infection nor to guide or monitor treatment for MRSA infections. RESULT CALLED TO, READ BACK BY AND VERIFIED WITH: K MOORE,RN @0617  12/28/15 MKELLY,MLT      Time coordinating discharge: Over 30 minutes  SIGNED:   Birdie Hopes, MD  [facility] 12/28/2015, 11:19 AM Pager   If 7PM-7AM, please contact night-coverage [email or web address] Password  [email or web address]

## 2015-12-28 NOTE — Progress Notes (Signed)
CRITICAL VALUE ALERT  Critical value received:  Positive MRSA PCR  Date of notification:  12/28/15  Time of notification:  0620  Critical value read back:Yes.    Nurse who received alert:  Mady Gemma, RN  MD notified (1st page):  Tylene Fantasia  Time of first page:  [phone], Notification of lab result and initiation of Positive MRSA standing orders.

## 2015-12-29 LAB — URINE CULTURE

## 2016-02-20 DIAGNOSIS — M545 Low back pain, unspecified: Secondary | ICD-10-CM | POA: Insufficient documentation

## 2016-02-22 DIAGNOSIS — D899 Disorder involving the immune mechanism, unspecified: Secondary | ICD-10-CM

## 2016-02-22 DIAGNOSIS — D849 Immunodeficiency, unspecified: Secondary | ICD-10-CM | POA: Diagnosis present

## 2016-03-03 DIAGNOSIS — S301XXA Contusion of abdominal wall, initial encounter: Secondary | ICD-10-CM | POA: Insufficient documentation

## 2017-06-02 ENCOUNTER — Encounter (HOSPITAL_BASED_OUTPATIENT_CLINIC_OR_DEPARTMENT_OTHER): Payer: Self-pay | Admitting: *Deleted

## 2017-06-02 ENCOUNTER — Emergency Department (HOSPITAL_BASED_OUTPATIENT_CLINIC_OR_DEPARTMENT_OTHER)
Admission: EM | Admit: 2017-06-02 | Discharge: 2017-06-02 | Disposition: A | Discharge status: Home / Self Care | Payer: Medicare HMO | Attending: Emergency Medicine | Admitting: Emergency Medicine

## 2017-06-02 ENCOUNTER — Other Ambulatory Visit: Payer: Self-pay

## 2017-06-02 DIAGNOSIS — Z79899 Other long term (current) drug therapy: Secondary | ICD-10-CM | POA: Diagnosis not present

## 2017-06-02 DIAGNOSIS — N184 Chronic kidney disease, stage 4 (severe): Secondary | ICD-10-CM | POA: Diagnosis not present

## 2017-06-02 DIAGNOSIS — R040 Epistaxis: Secondary | ICD-10-CM | POA: Insufficient documentation

## 2017-06-02 DIAGNOSIS — I129 Hypertensive chronic kidney disease with stage 1 through stage 4 chronic kidney disease, or unspecified chronic kidney disease: Secondary | ICD-10-CM | POA: Insufficient documentation

## 2017-06-02 MED ORDER — OXYMETAZOLINE HCL 0.05 % NA SOLN
2.0000 | Freq: Once | NASAL | Status: AC
  Administered 2017-06-02: 2 via NASAL
  Filled 2017-06-02: qty 15

## 2017-06-02 NOTE — ED Triage Notes (Signed)
Nose bleed. States she went to [facility] but the wait was long. Bleeding slowed down she went home but bleeding started again. No bleeding at time of arrival to registration.

## 2017-06-02 NOTE — ED Notes (Signed)
ED Provider at bedside. 

## 2017-06-02 NOTE — ED Provider Notes (Signed)
[place] EMERGENCY DEPARTMENT Provider Note   CSN: [id number] Arrival date & time: 06/02/17  1627     History   Chief Complaint Chief Complaint  Patient presents with  . Epistaxis    HPI Carolyn Zamora is a 62 y.o. female.  The history is provided by the patient and medical records.  Epistaxis   This is a new problem. The current episode started 1 to 2 hours ago. The problem occurs rarely. The problem has been resolved. The problem is associated with an unknown factor. The bleeding has been from the left nare. She has tried nothing for the symptoms. The treatment provided no relief. Her past medical history is significant for allergies. Her past medical history does not include bleeding disorder or frequent nosebleeds.    Past Medical History:  Diagnosis Date  . Anemia   . CKD (chronic kidney disease), stage IV ([place])   . History of kidney cancer   . Hypertension   . Lupus [facility])     Patient Active Problem List   Diagnosis Date Noted  . Pressure injury of skin 12/28/2015  . Acute renal failure (ARF) ([place]) 12/27/2015  . UTI (urinary tract infection) 12/27/2015  . Hyperkalemia 12/27/2015  . Acute renal failure superimposed on stage 4 chronic kidney disease ([place]) 12/27/2015  . Hypertension   . Lupus [facility])     Past Surgical History:  Procedure Laterality Date  . MULTIPLE TOOTH EXTRACTIONS    . NEPHRECTOMY       OB History   None      Home Medications    Prior to Admission medications   Medication Sig Start Date End Date Taking? Authorizing Provider  HYDROcodone-acetaminophen (NORCO/VICODIN) 5-325 MG tablet Take 1 tablet by mouth every 8 (eight) hours as needed for pain. 12/15/15  Yes [provider]  hydroxychloroquine (PLAQUENIL) 200 MG tablet Take 200 mg by mouth daily. 12/15/15  Yes [provider]  levothyroxine (SYNTHROID, LEVOTHROID) 25 MCG tablet Take 25 mcg by mouth daily. 12/15/15 06/02/17 Yes [provider]    predniSONE (DELTASONE) 10 MG tablet Take 10 mg by mouth daily with breakfast.   Yes [provider]  pregabalin (LYRICA) 150 MG capsule Take 150 mg by mouth 2 (two) times daily.   Yes [provider]  oxyCODONE-acetaminophen (PERCOCET/ROXICET) 5-325 MG per tablet Take by mouth every 4 (four) hours as needed for severe pain.    [provider]    Family History Family History  Problem Relation Age of Onset  . Hypertension Mother   . Diabetes Mellitus I Mother   . Lung cancer Father   . Hypertension Sister   . Colon cancer Brother     Social History Social History   Tobacco Use  . Smoking status: Never Smoker  . Smokeless tobacco: Never Used  Substance Use Topics  . Alcohol use: No  . Drug use: No     Allergies   Patient has no known allergies.   Review of Systems Review of Systems  Constitutional: Negative for chills, diaphoresis, fatigue and fever.  HENT: Positive for nosebleeds. Negative for congestion.   Respiratory: Negative for cough and chest tightness.   Cardiovascular: Negative for chest pain, palpitations and leg swelling.  Gastrointestinal: Negative for abdominal pain, diarrhea and nausea.  Genitourinary: Negative for flank pain.  Musculoskeletal: Negative for back pain, neck pain and neck stiffness.  Skin: Negative for rash and wound.  Neurological: Negative for syncope and light-headedness.  Psychiatric/Behavioral: Negative for agitation.  All other systems reviewed and are negative.    Physical Exam Updated Vital Signs BP 131/74   Pulse (!) 109   Temp 98.5 F (36.9 C) (Oral)   Resp 20   Ht 5\' 2"  (1.575 m)   Wt 74.8 kg (165 lb)   SpO2 100%   BMI 30.18 kg/m   Physical Exam  Constitutional: She appears well-developed and well-nourished. No distress.  HENT:  Head: Normocephalic and atraumatic.  Right Ear: External ear normal.  Left Ear: External ear normal.  Nose: Rhinorrhea present. No nasal deformity, septal  deviation or nasal septal hematoma. Epistaxis (resolved) is observed.    Eyes: Pupils are equal, round, and reactive to light. Conjunctivae and EOM are normal.  Neck: Neck supple.  Cardiovascular: Normal rate and regular rhythm.  No murmur heard. Pulmonary/Chest: Effort normal. No respiratory distress. She exhibits no tenderness.  Abdominal: Soft. There is no tenderness.  Musculoskeletal: She exhibits no edema or tenderness.  Neurological: She is alert.  Skin: Skin is warm and dry. Capillary refill takes less than 2 seconds. No rash noted. She is not diaphoretic.  Psychiatric: She has a normal mood and affect.  Nursing note and vitals reviewed.    ED Treatments / Results  Labs (all labs ordered are listed, but only abnormal results are displayed) Labs Reviewed - No data to display  EKG None  Radiology No results found.  Procedures Procedures (including critical care time)  Medications Ordered in ED Medications  oxymetazoline (AFRIN) 0.05 % nasal spray 2 spray (2 sprays Left Nare Given 06/02/17 1658)     Initial Impression / Assessment and Plan / ED Course  I have reviewed the triage vital signs and the nursing notes.  Pertinent labs & imaging results that were available during my care of the patient were reviewed by me and considered in my medical decision making (see chart for details).     Carolyn Zamora is a 61 y.o. female with past medical history significant for chronic anemia, CKD, renal cell cancer status post nephrectomy, and lupus who presents with epistaxis.  Patient reports that she went to see her PCP earlier today for chronic left hip pain.  She reports that this is being managed however after returning home, she began having a nosebleed.  It was from the left nare.  She thinks it may be due to the increased pollen and allergies causing her to have rhinorrhea and then the nosebleed.  She reports that it was concerning enough that she called 911.  She then  was taken by EMS to an outside hospital where the wait was long.  Patient then decided to go home after it stopped.  When it returned, she came to this facility for evaluation. Patient denies any blood thinner use.  On arrival, the is appeared to have stopped.  Patient denies any lightheadedness palpitation or syncope.  She denies any chest pain, shortness breath or other signs or symptoms of anemia.  She reports that it is only been bleeding on and off  this afternoon.  She denies any history of significant nosebleeds.  On my exam, patient had no active epistaxis from her nares.  On my inspection, no anterior nosebleed was seen.  Patient did cough up a large blood clot which is likely from her posterior oropharynx.  Given the absence of anterior bleed can suspect a left posterior bleed.  On my oropharyngeal exam, there was no blood streaming down the back of her throat, suspect it is nearly  stopped.  As patient left and returned with it rebleeding, patient will be given 2 sprays of Afrin and observe for period of time.  If no more epistaxis is seen, patient will likely be stable for discharge home.  Given her lack of systemic symptoms of anemia, do not feel patient needs labs at this time however if her vitals worsen or she has any new symptoms, would likely check blood work.  5:56 PM Several months ago, patient had some slight rebleeding in the left nare.  It is now stopped.  There is no posterior pharyngeal bleeding.  Decision made to watch patient for another 30 minutes.  If symptoms recur, will likely packed the nose.  If symptoms continue to have resolved, will let patient be discharged home with PCP follow-up.  Patient still denies any symptoms of severe anemia.  6:32 PM Patient was rechecked with no evidence of epistaxis.  Patient feels safe to go home.  Patient understands return precautions and follow-up instructions and outpatient epistaxis management strategies.  Next  Patient and family  had no other questions or concerns and patient was discharged in good condition with no further epistaxis.  Final Clinical Impressions(s) / ED Diagnoses   Final diagnoses:  Left-sided epistaxis    ED Discharge Orders    None      Clinical Impression: 1. Left-sided epistaxis     Disposition: Discharge  Condition: Good  I have discussed the results, Dx and Tx plan with the pt(& family if present). He/she/they expressed understanding and agree(s) with the plan. Discharge instructions discussed at great length. Strict return precautions discussed and pt &/or family have verbalized understanding of the instructions. No further questions at time of discharge.    New Prescriptions   No medications on file    Follow Up: Beckie Salts, [place] [facility] [id number] [phone]     [place] [address] [id number] PN PYYF [facility] [place] [phone]       [place] Delong, Gwenyth Allegra, MD 06/02/17 [phone]

## 2017-06-02 NOTE — Discharge Instructions (Signed)
Please follow-up with your primary care physician in the next several days for reassessment.  If you have any further nose bleeding that does not stop with the strategies we discussed, please return to the nearest emergency department.  Please stay hydrated.

## 2017-06-02 NOTE — ED Notes (Signed)
Pt nose actively bleeding out of left nostril. Pressure applied. EDP informed

## 2017-07-23 ENCOUNTER — Other Ambulatory Visit: Payer: Self-pay

## 2017-07-23 ENCOUNTER — Emergency Department (HOSPITAL_BASED_OUTPATIENT_CLINIC_OR_DEPARTMENT_OTHER): Payer: Medicare HMO

## 2017-07-23 ENCOUNTER — Inpatient Hospital Stay (HOSPITAL_BASED_OUTPATIENT_CLINIC_OR_DEPARTMENT_OTHER)
Admission: EM | Admit: 2017-07-23 | Discharge: 2017-07-29 | DRG: 871 | Disposition: A | Discharge status: Home Health | Payer: Medicare HMO | Attending: Internal Medicine | Admitting: Internal Medicine

## 2017-07-23 ENCOUNTER — Encounter (HOSPITAL_BASED_OUTPATIENT_CLINIC_OR_DEPARTMENT_OTHER): Payer: Self-pay | Admitting: *Deleted

## 2017-07-23 DIAGNOSIS — Z8249 Family history of ischemic heart disease and other diseases of the circulatory system: Secondary | ICD-10-CM | POA: Diagnosis not present

## 2017-07-23 DIAGNOSIS — R4182 Altered mental status, unspecified: Secondary | ICD-10-CM | POA: Diagnosis not present

## 2017-07-23 DIAGNOSIS — Z833 Family history of diabetes mellitus: Secondary | ICD-10-CM | POA: Diagnosis not present

## 2017-07-23 DIAGNOSIS — I10 Essential (primary) hypertension: Secondary | ICD-10-CM | POA: Diagnosis present

## 2017-07-23 DIAGNOSIS — I129 Hypertensive chronic kidney disease with stage 1 through stage 4 chronic kidney disease, or unspecified chronic kidney disease: Secondary | ICD-10-CM | POA: Diagnosis present

## 2017-07-23 DIAGNOSIS — Z9081 Acquired absence of spleen: Secondary | ICD-10-CM | POA: Diagnosis not present

## 2017-07-23 DIAGNOSIS — Z85528 Personal history of other malignant neoplasm of kidney: Secondary | ICD-10-CM | POA: Diagnosis not present

## 2017-07-23 DIAGNOSIS — N184 Chronic kidney disease, stage 4 (severe): Secondary | ICD-10-CM | POA: Diagnosis present

## 2017-07-23 DIAGNOSIS — Z7952 Long term (current) use of systemic steroids: Secondary | ICD-10-CM | POA: Diagnosis not present

## 2017-07-23 DIAGNOSIS — D696 Thrombocytopenia, unspecified: Secondary | ICD-10-CM | POA: Diagnosis present

## 2017-07-23 DIAGNOSIS — N183 Chronic kidney disease, stage 3 unspecified: Secondary | ICD-10-CM | POA: Diagnosis present

## 2017-07-23 DIAGNOSIS — Z79899 Other long term (current) drug therapy: Secondary | ICD-10-CM | POA: Diagnosis not present

## 2017-07-23 DIAGNOSIS — Z905 Acquired absence of kidney: Secondary | ICD-10-CM | POA: Diagnosis not present

## 2017-07-23 DIAGNOSIS — A4159 Other Gram-negative sepsis: Principal | ICD-10-CM | POA: Diagnosis present

## 2017-07-23 DIAGNOSIS — Z801 Family history of malignant neoplasm of trachea, bronchus and lung: Secondary | ICD-10-CM | POA: Diagnosis not present

## 2017-07-23 DIAGNOSIS — N179 Acute kidney failure, unspecified: Secondary | ICD-10-CM | POA: Diagnosis present

## 2017-07-23 DIAGNOSIS — G9341 Metabolic encephalopathy: Secondary | ICD-10-CM | POA: Diagnosis present

## 2017-07-23 DIAGNOSIS — M329 Systemic lupus erythematosus, unspecified: Secondary | ICD-10-CM | POA: Diagnosis present

## 2017-07-23 DIAGNOSIS — M109 Gout, unspecified: Secondary | ICD-10-CM | POA: Diagnosis present

## 2017-07-23 DIAGNOSIS — Z800 Family history of malignant neoplasm of digestive organs: Secondary | ICD-10-CM

## 2017-07-23 DIAGNOSIS — D631 Anemia in chronic kidney disease: Secondary | ICD-10-CM | POA: Diagnosis present

## 2017-07-23 DIAGNOSIS — A419 Sepsis, unspecified organism: Secondary | ICD-10-CM | POA: Diagnosis present

## 2017-07-23 DIAGNOSIS — R7881 Bacteremia: Secondary | ICD-10-CM | POA: Diagnosis not present

## 2017-07-23 DIAGNOSIS — N12 Tubulo-interstitial nephritis, not specified as acute or chronic: Secondary | ICD-10-CM | POA: Diagnosis present

## 2017-07-23 LAB — URINALYSIS, ROUTINE W REFLEX MICROSCOPIC
Bilirubin Urine: NEGATIVE
Glucose, UA: NEGATIVE mg/dL
KETONES UR: 15 mg/dL — AB
NITRITE: NEGATIVE
PROTEIN: 30 mg/dL — AB
Specific Gravity, Urine: 1.025 (ref 1.005–1.030)
pH: 6 (ref 5.0–8.0)

## 2017-07-23 LAB — COMPREHENSIVE METABOLIC PANEL
ALBUMIN: 3.2 g/dL — AB (ref 3.5–5.0)
ALK PHOS: 111 U/L (ref 38–126)
ALT: 43 U/L (ref 14–54)
AST: 66 U/L — ABNORMAL HIGH (ref 15–41)
Anion gap: 13 (ref 5–15)
BILIRUBIN TOTAL: 0.7 mg/dL (ref 0.3–1.2)
BUN: 49 mg/dL — AB (ref 6–20)
CALCIUM: 8.1 mg/dL — AB (ref 8.9–10.3)
CO2: 18 mmol/L — AB (ref 22–32)
Chloride: 108 mmol/L (ref 101–111)
Creatinine, Ser: 3.92 mg/dL — ABNORMAL HIGH (ref 0.44–1.00)
GFR calc Af Amer: 13 mL/min — ABNORMAL LOW (ref >60)
GFR calc non Af Amer: 12 mL/min — ABNORMAL LOW (ref >60)
GLUCOSE: 110 mg/dL — AB (ref 65–99)
Potassium: 3.8 mmol/L (ref 3.5–5.1)
SODIUM: 139 mmol/L (ref 135–145)
TOTAL PROTEIN: 6.3 g/dL — AB (ref 6.5–8.1)

## 2017-07-23 LAB — CBC WITH DIFFERENTIAL/PLATELET
Basophils Absolute: 0 10*3/uL (ref 0.0–0.1)
Basophils Relative: 0 %
Eosinophils Absolute: 0 10*3/uL (ref 0.0–0.7)
Eosinophils Relative: 0 %
HEMATOCRIT: 33.2 % — AB (ref 36.0–46.0)
HEMOGLOBIN: 10.7 g/dL — AB (ref 12.0–15.0)
LYMPHS ABS: 1.5 10*3/uL (ref 0.7–4.0)
Lymphocytes Relative: 11 %
MCH: 22.5 pg — ABNORMAL LOW (ref 26.0–34.0)
MCHC: 32.2 g/dL (ref 30.0–36.0)
MCV: 69.7 fL — ABNORMAL LOW (ref 78.0–100.0)
MONOS PCT: 10 %
Monocytes Absolute: 1.3 10*3/uL — ABNORMAL HIGH (ref 0.1–1.0)
NEUTROS ABS: 10.6 10*3/uL — AB (ref 1.7–7.7)
Neutrophils Relative %: 79 %
Platelets: 119 10*3/uL — ABNORMAL LOW (ref 150–400)
RBC: 4.76 MIL/uL (ref 3.87–5.11)
RDW: 17 % — ABNORMAL HIGH (ref 11.5–15.5)
WBC: 13.4 10*3/uL — AB (ref 4.0–10.5)

## 2017-07-23 LAB — TROPONIN I
Troponin I: 0.03 ng/mL (ref <0.03)
Troponin I: 0.04 ng/mL (ref <0.03)

## 2017-07-23 LAB — PROTIME-INR
INR: 1.03
Prothrombin Time: 13.4 seconds (ref 11.4–15.2)

## 2017-07-23 LAB — MAGNESIUM: Magnesium: 1.1 mg/dL — ABNORMAL LOW (ref 1.7–2.4)

## 2017-07-23 LAB — URINALYSIS, MICROSCOPIC (REFLEX)

## 2017-07-23 LAB — I-STAT CG4 LACTIC ACID, ED
Lactic Acid, Venous: 2.04 mmol/L (ref 0.5–1.9)
Lactic Acid, Venous: 1.25 mmol/L (ref 0.5–1.9)

## 2017-07-23 MED ORDER — ONDANSETRON HCL 4 MG/2ML IJ SOLN
4.0000 mg | Freq: Four times a day (QID) | INTRAMUSCULAR | Status: DC | PRN
  Administered 2017-07-23: 4 mg via INTRAVENOUS
  Filled 2017-07-23: qty 2

## 2017-07-23 MED ORDER — CEFEPIME HCL 1 G IJ SOLR
1.0000 g | INTRAMUSCULAR | Status: DC
  Administered 2017-07-24 – 2017-07-25 (×2): 1 g via INTRAVENOUS
  Filled 2017-07-23 (×3): qty 1

## 2017-07-23 MED ORDER — POTASSIUM CHLORIDE IN NACL 20-0.9 MEQ/L-% IV SOLN
INTRAVENOUS | Status: DC
  Administered 2017-07-23: 20:00:00 via INTRAVENOUS
  Filled 2017-07-23: qty 1000

## 2017-07-23 MED ORDER — FAMOTIDINE IN NACL 20-0.9 MG/50ML-% IV SOLN
20.0000 mg | INTRAVENOUS | Status: DC
  Administered 2017-07-23 – 2017-07-24 (×2): 20 mg via INTRAVENOUS
  Filled 2017-07-23 (×2): qty 50

## 2017-07-23 MED ORDER — PREGABALIN 75 MG PO CAPS
150.0000 mg | ORAL_CAPSULE | Freq: Every day | ORAL | Status: DC
  Administered 2017-07-24 – 2017-07-27 (×4): 150 mg via ORAL
  Filled 2017-07-23 (×4): qty 2

## 2017-07-23 MED ORDER — ONDANSETRON HCL 4 MG PO TABS: 4.0000 mg | ORAL_TABLET | Freq: Four times a day (QID) | ORAL | Status: DC | PRN

## 2017-07-23 MED ORDER — SODIUM CHLORIDE 0.9 % IV BOLUS
1000.0000 mL | Freq: Once | INTRAVENOUS | Status: AC
  Administered 2017-07-23: 1000 mL via INTRAVENOUS

## 2017-07-23 MED ORDER — ACETAMINOPHEN 500 MG PO TABS
1000.0000 mg | ORAL_TABLET | Freq: Once | ORAL | Status: AC
  Administered 2017-07-23: 1000 mg via ORAL
  Filled 2017-07-23: qty 2

## 2017-07-23 MED ORDER — SODIUM CHLORIDE 0.9 % IV SOLN
INTRAVENOUS | Status: DC
  Administered 2017-07-23: 23:00:00 via INTRAVENOUS

## 2017-07-23 MED ORDER — SODIUM CHLORIDE 0.9 % IV BOLUS
500.0000 mL | Freq: Once | INTRAVENOUS | Status: AC
  Administered 2017-07-23: 500 mL via INTRAVENOUS

## 2017-07-23 MED ORDER — MAGNESIUM SULFATE 2 GM/50ML IV SOLN
2.0000 g | Freq: Once | INTRAVENOUS | Status: AC
  Administered 2017-07-23: 2 g via INTRAVENOUS
  Filled 2017-07-23: qty 50

## 2017-07-23 MED ORDER — HYDROCORTISONE NA SUCCINATE PF 100 MG IJ SOLR
50.0000 mg | Freq: Three times a day (TID) | INTRAMUSCULAR | Status: AC
  Administered 2017-07-23 – 2017-07-25 (×6): 50 mg via INTRAVENOUS
  Filled 2017-07-23 (×6): qty 2

## 2017-07-23 MED ORDER — MORPHINE SULFATE (PF) 2 MG/ML IV SOLN
2.0000 mg | Freq: Once | INTRAVENOUS | Status: AC
  Administered 2017-07-23: 2 mg via INTRAVENOUS
  Filled 2017-07-23: qty 1

## 2017-07-23 MED ORDER — PROMETHAZINE HCL 25 MG/ML IJ SOLN
12.5000 mg | Freq: Three times a day (TID) | INTRAMUSCULAR | Status: DC | PRN
Start: 2017-07-23 — End: 2017-07-29
  Administered 2017-07-24 – 2017-07-25 (×2): 12.5 mg via INTRAVENOUS
  Filled 2017-07-23 (×2): qty 1

## 2017-07-23 MED ORDER — VANCOMYCIN HCL IN DEXTROSE 1-5 GM/200ML-% IV SOLN: 1000.0000 mg | INTRAVENOUS | Status: DC

## 2017-07-23 MED ORDER — VANCOMYCIN HCL 10 G IV SOLR
1500.0000 mg | Freq: Once | INTRAVENOUS | Status: AC
  Administered 2017-07-23: 1500 mg via INTRAVENOUS
  Filled 2017-07-23: qty 1500

## 2017-07-23 MED ORDER — LORAZEPAM 2 MG/ML IJ SOLN
0.5000 mg | Freq: Once | INTRAMUSCULAR | Status: AC
  Administered 2017-07-23: 0.5 mg via INTRAVENOUS
  Filled 2017-07-23: qty 1

## 2017-07-23 MED ORDER — MYCOPHENOLATE MOFETIL 250 MG PO CAPS
500.0000 mg | ORAL_CAPSULE | Freq: Every day | ORAL | Status: DC
  Filled 2017-07-23: qty 2

## 2017-07-23 MED ORDER — ACETAMINOPHEN 325 MG RE SUPP
325.0000 mg | Freq: Once | RECTAL | Status: AC
  Administered 2017-07-23: 325 mg via RECTAL
  Filled 2017-07-23: qty 1

## 2017-07-23 MED ORDER — ACETAMINOPHEN 650 MG RE SUPP
650.0000 mg | Freq: Once | RECTAL | Status: AC
  Administered 2017-07-23: 650 mg via RECTAL
  Filled 2017-07-23: qty 1

## 2017-07-23 MED ORDER — VANCOMYCIN HCL 500 MG IV SOLR
INTRAVENOUS | Status: AC
  Filled 2017-07-23: qty 500

## 2017-07-23 MED ORDER — HYDROXYCHLOROQUINE SULFATE 200 MG PO TABS
200.0000 mg | ORAL_TABLET | Freq: Every day | ORAL | Status: DC
  Administered 2017-07-24: 200 mg via ORAL
  Filled 2017-07-23: qty 1

## 2017-07-23 MED ORDER — OXYCODONE-ACETAMINOPHEN 5-325 MG PO TABS
1.0000 | ORAL_TABLET | Freq: Three times a day (TID) | ORAL | Status: DC | PRN
  Administered 2017-07-23 – 2017-07-29 (×14): 1 via ORAL
  Filled 2017-07-23 (×14): qty 1

## 2017-07-23 MED ORDER — CEFEPIME HCL 2 G IJ SOLR
INTRAMUSCULAR | Status: AC
  Filled 2017-07-23: qty 2

## 2017-07-23 MED ORDER — VANCOMYCIN HCL 1000 MG IV SOLR
INTRAVENOUS | Status: AC
Start: 2017-07-23 — End: 2017-07-24
  Filled 2017-07-23: qty 1000

## 2017-07-23 MED ORDER — HYDROCORTISONE NA SUCCINATE PF 100 MG IJ SOLR: 50.0000 mg | Freq: Three times a day (TID) | INTRAMUSCULAR | Status: DC

## 2017-07-23 MED ORDER — CEFEPIME HCL 2 G IJ SOLR
2.0000 g | Freq: Once | INTRAMUSCULAR | Status: AC
  Administered 2017-07-23: 2 g via INTRAVENOUS

## 2017-07-23 NOTE — ED Triage Notes (Signed)
Vomiting x 3 days. Abdominal pain.

## 2017-07-23 NOTE — Progress Notes (Signed)
Pharmacy Antibiotic Note  Carolyn Zamora is a 62 y.o. female admitted on 07/23/2017 with sepsis. PMH includes lupus, CKD, renal cell carcinoma, and nephrectomy in 2016.  Not currently on dialysis. Pharmacy has been consulted for vancomycin and cefepime dosing.  Plan: - cefepime 2 G x 1 - cefepime 1 G every 24 hours  - vancomycin 1500 mg load x 1 - vancomycin 1000 mg every 48 hours - goal trough 15-20 mcg/mL  - monitor clinical progression, length of therapy, renal function, and vancomycin trough as needed  Height: 5\' 1"  (154.9 cm) Weight: 165 lb (74.8 kg) IBW/kg (Calculated) : 47.8  Temp (24hrs), Avg:100.4 F (38 C), Min:100.4 F (38 C), Max:100.4 F (38 C)  Recent Labs  Lab 07/23/17 1309  LATICACIDVEN 2.04*    CrCl cannot be calculated (Patient's most recent lab result is older than the maximum 21 days allowed.).    No Known Allergies  Antimicrobials this admission: Vancomycin 6/13 >>  Cefepime 6/13 >>   Dose adjustments this admission: N/A  Microbiology results: 6/13 BCx: pending  Thank you for allowing pharmacy to be a part of this patient's care.  Deboraha Sprang, PharmD 07/23/2017 1:12 PM

## 2017-07-23 NOTE — ED Provider Notes (Signed)
Patient seen and evaluated.  Discussed with PA.  Patient with urosepsis.  Minimal elevation of lactate.  Mentating well.  Creatinine slightly above baseline at 3.9.  Plan sepsis fluids.  Has been given antibiotics.  Urine appears infected and probable source.  Patient's preference is [facility].  Will discuss with hospitalist regarding availability for transfer.  Remains tachycardic but not hypotensive  CRITICAL CARE Performed by: Lolita Patella   Total critical care time: 30 minutes  Critical care time was exclusive of separately billable procedures and treating other patients.  Critical care was necessary to treat or prevent imminent or life-threatening deterioration.  Critical care was time spent personally by me on the following activities: development of treatment plan with patient and/or surrogate as well as nursing, discussions with consultants, evaluation of patient's response to treatment, examination of patient, obtaining history from patient or surrogate, ordering and performing treatments and interventions, ordering and review of laboratory studies, ordering and review of radiographic studies, pulse oximetry and re-evaluation of patient's condition.    Tanna Furry, MD 07/23/17 1531

## 2017-07-23 NOTE — H&P (Signed)
History and Physical    Carolyn Zamora [facility]:[id number] DOB: [birth year] DOA: 07/23/2017  PCP: Beckie Salts, MD   Patient coming from: Home [facility]  Chief Complaint: Confusion, abdominal pain, dysuria  HPI: Carolyn Zamora is a 62 y.o. female with medical history significant for lupus, HTN, CKD IV and kidney cancer s/p left nephrectomy, splenectomy.  Symptoms started 3 days ago with abdominal pain and several episodes of vomiting.  Also with dysuria and urgency of 3 days duration and headaches.  Patient reports fever and chills at home, associated poor p.o. Intake. Patient reports chest pain, constant , described as burning at the center of her chest radiating down to her abdomen, the started this morning.  She denies shortness of breath, no cough.  ED Course: Febrile up to 103.4, blood pressure systolic 353-614, tachycardic to 144, O2 sats greater than 94% on room air.  WBC 13.4, hemoglobin 10.7, platelets 119, creatinine about baseline 3.9, elevated BUN 49, bicarb 18, magnesium low 1.1, UA moderate leukocytes many bacteria, EKG mostly unchanged from prior but with prolonged QTC up to 644 on repeat.  Lactic acid 2.04>> 1.25, chest x-ray negative for acute abnormality.  Troponin 0.03 >0.04.  Blood cultures urine cultures were obtained.  Patient was given 1.5 L normal saline bolus, IV vancomycin and cefepime was start  in the ED. Hospitalist was called to admit and transfer for sepsis.  Review of Systems: As per HPI otherwise 10 point review of systems negative.   Past Medical History:  Diagnosis Date  . Anemia   . CKD (chronic kidney disease), stage IV ([place])   . History of kidney cancer   . Hypertension   . Lupus [facility])     Past Surgical History:  Procedure Laterality Date  . MULTIPLE TOOTH EXTRACTIONS    . NEPHRECTOMY    . SPLENECTOMY       reports that she has never smoked. She has never used smokeless tobacco. She reports that she does not drink alcohol or use drugs.  No  Known Allergies  Family History  Problem Relation Age of Onset  . Hypertension Mother   . Diabetes Mellitus I Mother   . Lung cancer Father   . Hypertension Sister   . Colon cancer Brother     Prior to Admission medications   Medication Sig Start Date End Date Taking? Authorizing Provider  furosemide (LASIX) 40 MG tablet Take 40 mg by mouth daily. 07/02/17  Yes [provider]  HYDROcodone-acetaminophen (NORCO/VICODIN) 5-325 MG tablet Take 1 tablet by mouth every 8 (eight) hours as needed for pain. 12/15/15  Yes [provider]  hydroxychloroquine (PLAQUENIL) 200 MG tablet Take 200 mg by mouth daily. 12/15/15  Yes [provider]  hydrOXYzine (ATARAX/VISTARIL) 50 MG tablet Take 50 mg by mouth 3 (three) times daily as needed. 07/06/17  Yes [provider]  mycophenolate (CELLCEPT) 500 MG tablet Take 500 mg by mouth daily. 03/18/17  Yes [provider]  oxyCODONE-acetaminophen (PERCOCET/ROXICET) 5-325 MG per tablet Take by mouth every 4 (four) hours as needed for severe pain.   Yes [provider]  predniSONE (DELTASONE) 10 MG tablet Take 10 mg by mouth daily with breakfast.   Yes [provider]  pregabalin (LYRICA) 150 MG capsule Take 150 mg by mouth 2 (two) times daily.   Yes [provider]  traZODone (DESYREL) 50 MG tablet Take 50 mg by mouth daily. 06/29/17  Yes [provider]  Vitamin D, Ergocalciferol, (DRISDOL) 50000 units CAPS capsule Take  1 capsule by mouth once a week. 07/01/17  Yes [provider]  amLODipine (NORVASC) 10 MG tablet Take 10 mg by mouth daily. 07/23/17   [provider]    Physical Exam: Vitals:   07/23/17 1630 07/23/17 1703 07/23/17 1730 07/23/17 1843  BP: 127/66 116/80 129/65   Pulse: (!) 121 (!) 120 (!) 118 (!) 120  Resp: 20 20 (!) 22   Temp:  (!) 103.4 F (39.7 C)  (!) 100.4 F (38 C)  TempSrc:  Rectal  Oral  SpO2: 100% 99% 100% 97%  Weight:      Height:          Constitutional: Anxious,  Vitals:   07/23/17 1630 07/23/17 1703 07/23/17 1730 07/23/17 1843  BP: 127/66 116/80 129/65   Pulse: (!) 121 (!) 120 (!) 118 (!) 120  Resp: 20 20 (!) 22   Temp:  (!) 103.4 F (39.7 C)  (!) 100.4 F (38 C)  TempSrc:  Rectal  Oral  SpO2: 100% 99% 100% 97%  Weight:      Height:       Eyes: PERRL, lids and conjunctivae normal ENMT: Mucous membranes are moist. Posterior pharynx clear of any exudate or lesions.Normal dentition.  Neck: normal, supple, no masses, no thyromegaly Respiratory: clear to auscultation bilaterally, no wheezing, no crackles. Normal respiratory effort. No accessory muscle use.  Cardiovascular: Tachycardic but regular rate and rhythm, no murmurs / rubs / gallops. No extremity edema. 2+ pedal pulses. No carotid bruits.  Abdomen: Mild abdominal tenderness mostly suprapubic region, but soft  no masses palpated. No hepatosplenomegaly. Bowel sounds positive.  Musculoskeletal: no clubbing / cyanosis. No joint deformity upper and lower extremities. Good ROM, no contractures. Normal muscle tone.  Skin: Diffuse hyperpigmented lesions mostly on extremities from lupus , no ulcers. No induration Neurologic: CN 2-12 grossly intact. Strength 5/5 in all 4.  Psychiatric: Normal judgment and insight. Alert and oriented x 3. Normal mood.   Labs on Admission: I have personally reviewed following labs and imaging studies  CBC: Recent Labs  Lab 07/23/17 1301  WBC 13.4*  NEUTROABS 10.6*  HGB 10.7*  HCT 33.2*  MCV 69.7*  PLT 938*   Basic Metabolic Panel: Recent Labs  Lab 07/23/17 1301 07/23/17 1652  NA 139  --   K 3.8  --   CL 108  --   CO2 18*  --   GLUCOSE 110*  --   BUN 49*  --   CREATININE 3.92*  --   CALCIUM 8.1*  --   MG  --  1.1*   Liver Function Tests: Recent Labs  Lab 07/23/17 1301  AST 66*  ALT 43  ALKPHOS 111  BILITOT 0.7  PROT 6.3*  ALBUMIN 3.2*   Coagulation Profile: Recent Labs  Lab 07/23/17 1301  INR 1.03    Cardiac Enzymes: Recent Labs  Lab 07/23/17 1308 07/23/17 1652  TROPONINI 0.03* 0.04*   Urine analysis:    Component Value Date/Time   COLORURINE YELLOW 07/23/2017 1243   APPEARANCEUR CLOUDY (A) 07/23/2017 1243   LABSPEC 1.025 07/23/2017 1243   PHURINE 6.0 07/23/2017 1243   GLUCOSEU NEGATIVE 07/23/2017 1243   HGBUR LARGE (A) 07/23/2017 1243   BILIRUBINUR NEGATIVE 07/23/2017 1243   KETONESUR 15 (A) 07/23/2017 1243   PROTEINUR 30 (A) 07/23/2017 1243   NITRITE NEGATIVE 07/23/2017 1243   LEUKOCYTESUR MODERATE (A) 07/23/2017 1243    Radiological Exams on Admission: Dg Chest 2 View  Result Date: 07/23/2017 CLINICAL DATA:  Fevers  and vomiting EXAM: CHEST - 2 VIEW COMPARISON:  02/22/2016 FINDINGS: The heart size and mediastinal contours are within normal limits. Both lungs are clear. The visualized skeletal structures are unremarkable. IMPRESSION: No active cardiopulmonary disease. Electronically Signed   By: Inez Catalina M.D.   On: 07/23/2017 13:57    EKG: Sinus tachycardia, QTC markedly prolonged initially 614, repeat 644.  Prior EKGs from 2017 prolonged QTC also 528.  Assessment/Plan Principal Problem:   Sepsis ([place]) Active Problems:   Hypertension   Lupus ([facility])   CKD (chronic kidney disease), stage IV ([place])  Sepsis-likely urinary source.  Dysuria, frequency, abdominal pain.  Febrile to 103.4.  WBC 13.4.  UA suggestive. Chest x-ray negative for acute abnormality.  Lactic acid - 2.04>1.25.  1.5 L bolus given in ED. IV Vanco and cefepime started in ED. Immunocompromised on chronic low-dose prednisone and mycophenolate. -IV Vanco and cefepime per pharmacy for now pending clinical course and culture results -Follow blood and urine cultures drawn in ED -Hydrate -IV fluid normal saline 100cc/hr X 12 hrs - Will start stress dose steroids -hydrocortisone 50 3 times daily X 6 doses, reevaluate and consider taper  Metabolic encephalopathy-confusion.  Likely secondary to sepsis.   -Antibiotics hydrate  Chest pain -radiating to abdomen, elevated troponin- 0.03>0.04.  EKG Long QTC otherwise no significant change from prior.  Hemoglobin 12.1>10.7.  ?gastritis versus demand ischemia. -Trend troponins X 2 -EKG a.m. -IV famotidine  ProLong QTC-  Chronically elevated, 2017 EKG QTC 528.  Repeated EKGs in ED QTC up to 644. Not on QTC prolonging medications.  Magnesium check in ED low 1.1, likely aggravating prolonged QTC.  K normal 3.8. -Trend troponin -Monitor on telemetry -Replete magnesium  Hypomagnesemia- 1.1.  K normal 3.8 -Replete cautiously with CKD 4  Hemoglobin drop- 12.1>10.7.  Reports burning epigastric pain and central chest pain.  Platelets 119 -CBC a.m. -Hold DVT prophylaxis for now  CKD 4-creatinine 3.9 to about baseline -BMP a.m. monitor while on Vanc  Lupus-Home medications Plaquenil chronic steroids 10 mg prednisone daily, mycophenolate. -Will start stress dose steroids -hydrocortisone 50 3 times daily X 6 doses, reevaluate and consider taper -Hold home immunoppressants mycophenolate for now considering severity of infection. -Continue home oxycodone  HIV as part of routine health screening   DVT prophylaxis: Scds Code Status: Full  Family Communication: Daughter sister grandson present at bedside Disposition Plan: Per rounding team Consults called: None Admission status: Inpt, tele   Bethena Roys MD [facility] Pager [phone] From 6PM-2AM.  Otherwise please contact night-coverage [email or web address] Password [facility]  07/23/2017, 7:21 PM

## 2017-07-23 NOTE — ED Notes (Signed)
Troponin 0.03, results given to ED provider and primary nurse

## 2017-07-23 NOTE — ED Notes (Signed)
Unable to obtain 2nd blood culture.  Patient is difficult stick.  EDP made aware.

## 2017-07-23 NOTE — ED Notes (Signed)
Dosage verified with EDP. (975 mg)

## 2017-07-23 NOTE — ED Provider Notes (Signed)
[facility] EMERGENCY DEPARTMENT Provider Note   CSN: [id number] Arrival date & time: 07/23/17  1232     History   Chief Complaint Chief Complaint  Patient presents with  . Emesis    HPI Carolyn Zamora is a 62 y.o. female with a h/o Lupus and chronic kidney disease s/p left nephrectomy, HTN, and splenectomy who presents to the Emergency Department with a chief complaint of confusion.  The patient's daughter reports acute confusion since the patient awoke this morning.  At baseline, the patient drives herself to all of her medical appointment and is "very with it" per her daughter but has seemed "off" since this morning.  The patient is oriented to person and place, but states that the year is 2001 and the month is May.  She tries to deflect the question for several minutes before answering.  The patient also endorses NBNB emesis and diffuse abdominal pain that is been gradually worsening for the last 3 days with associated headache, dysuria and urinary urgency.  She denies diarrhea, constipation, dysuria, vaginal bleeding, rash, chest pain, dyspnea, URI symptoms, numbness, or weakness.  The history is provided by the patient. No language interpreter was used.  Emesis   Associated symptoms include abdominal pain, chills, a fever and headaches. Pertinent negatives include no arthralgias.    Past Medical History:  Diagnosis Date  . Anemia   . CKD (chronic kidney disease), stage IV ([place])   . History of kidney cancer   . Hypertension   . Lupus [facility])     Patient Active Problem List   Diagnosis Date Noted  . Sepsis ([place]) 07/23/2017  . Pressure injury of skin 12/28/2015  . Acute renal failure (ARF) ([place]) 12/27/2015  . UTI (urinary tract infection) 12/27/2015  . Hyperkalemia 12/27/2015  . Acute renal failure superimposed on stage 4 chronic kidney disease ([place]) 12/27/2015  . Hypertension   . Lupus [facility])     Past Surgical History:  Procedure Laterality Date  .  MULTIPLE TOOTH EXTRACTIONS    . NEPHRECTOMY       OB History   None      Home Medications    Prior to Admission medications   Medication Sig Start Date End Date Taking? Authorizing Provider  furosemide (LASIX) 40 MG tablet Take 40 mg by mouth daily. 07/02/17  Yes [provider]  HYDROcodone-acetaminophen (NORCO/VICODIN) 5-325 MG tablet Take 1 tablet by mouth every 8 (eight) hours as needed for pain. 12/15/15  Yes [provider]  hydroxychloroquine (PLAQUENIL) 200 MG tablet Take 200 mg by mouth daily. 12/15/15  Yes [provider]  hydrOXYzine (ATARAX/VISTARIL) 50 MG tablet Take 50 mg by mouth 3 (three) times daily as needed. 07/06/17  Yes [provider]  mycophenolate (CELLCEPT) 500 MG tablet Take 500 mg by mouth daily. 03/18/17  Yes [provider]  oxyCODONE-acetaminophen (PERCOCET/ROXICET) 5-325 MG per tablet Take by mouth every 4 (four) hours as needed for severe pain.   Yes [provider]  predniSONE (DELTASONE) 10 MG tablet Take 10 mg by mouth daily with breakfast.   Yes [provider]  pregabalin (LYRICA) 150 MG capsule Take 150 mg by mouth 2 (two) times daily.   Yes [provider]  traZODone (DESYREL) 50 MG tablet Take 50 mg by mouth daily. 06/29/17  Yes [provider]  Vitamin D, Ergocalciferol, (DRISDOL) 50000 units CAPS capsule Take 1 capsule by mouth once a week. 07/01/17  Yes [provider]  amLODipine (NORVASC) 10 MG  tablet Take 10 mg by mouth daily. 07/23/17   [provider]    Family History Family History  Problem Relation Age of Onset  . Hypertension Mother   . Diabetes Mellitus I Mother   . Lung cancer Father   . Hypertension Sister   . Colon cancer Brother     Social History Social History   Tobacco Use  . Smoking status: Never Smoker  . Smokeless tobacco: Never Used  Substance Use Topics  . Alcohol use: No  . Drug use: No     Allergies   Patient has  no known allergies.   Review of Systems Review of Systems  Constitutional: Positive for chills and fever. Negative for activity change.  HENT: Negative for congestion and drooling.   Eyes: Negative for visual disturbance.  Respiratory: Negative for shortness of breath.   Cardiovascular: Negative for chest pain.  Gastrointestinal: Positive for abdominal pain, nausea and vomiting.  Genitourinary: Positive for dysuria, frequency and urgency.  Musculoskeletal: Positive for back pain. Negative for arthralgias, neck pain and neck stiffness.  Skin: Negative for rash.  Allergic/Immunologic: Negative for immunocompromised state.  Neurological: Positive for headaches. Negative for dizziness, seizures, syncope and weakness.  Psychiatric/Behavioral: Negative for confusion.   Physical Exam Updated Vital Signs BP 116/80 (BP Location: Right Arm)   Pulse (!) 120   Temp (!) 103.4 F (39.7 C) (Rectal)   Resp 20   Ht 5\' 1"  (1.549 m)   Wt 74.8 kg (165 lb)   SpO2 99%   BMI 31.18 kg/m   Physical Exam  Constitutional: She appears toxic. She appears ill. She appears distressed.  She is toxic and ill appearing.  Chronically ill-appearing elderly female. Rigors.   HENT:  Head: Normocephalic.  Eyes: Pupils are equal, round, and reactive to light. Conjunctivae and EOM are normal.  Neck: Neck supple.  Cardiovascular: Normal rate, regular rhythm, normal heart sounds and intact distal pulses. Exam reveals no gallop and no friction rub.  No murmur heard. Pulmonary/Chest: Effort normal. No stridor. No respiratory distress. She has no wheezes. She has no rales. She exhibits no tenderness.  Abdominal: Soft. Bowel sounds are normal. She exhibits no distension and no mass. There is tenderness. There is no rebound and no guarding. No hernia.  Diffusely tender to palpation throughout the abdomen.  Normoactive bowel sounds.  No focal tenderness throughout.  No peritoneal signs.  Musculoskeletal: She exhibits  tenderness.  Diffusely tender to palpation throughout the bilateral thoracic and lumbar back to the bilateral paraspinal muscles.  No midline tenderness.  Neurological: She is alert.  Oriented to place and person only. She thinks the year is 2001. Month is May.   Cranial nerves II through XII are grossly intact.  5 out of 5 strength against resistance of the bilateral upper and lower extremities.  Sensation is intact and symmetric throughout.   Skin: Skin is warm. No rash noted. She is diaphoretic.  Psychiatric: Her behavior is normal.  Nursing note and vitals reviewed.  ED Treatments / Results  Labs (all labs ordered are listed, but only abnormal results are displayed) Labs Reviewed  COMPREHENSIVE METABOLIC PANEL - Abnormal; Notable for the following components:      Result Value   CO2 18 (*)    Glucose, Bld 110 (*)    BUN 49 (*)    Creatinine, Ser 3.92 (*)    Calcium 8.1 (*)    Total Protein 6.3 (*)    Albumin 3.2 (*)    AST  66 (*)    GFR calc non Af Amer 12 (*)    GFR calc Af Amer 13 (*)    All other components within normal limits  CBC WITH DIFFERENTIAL/PLATELET - Abnormal; Notable for the following components:   WBC 13.4 (*)    Hemoglobin 10.7 (*)    HCT 33.2 (*)    MCV 69.7 (*)    MCH 22.5 (*)    RDW 17.0 (*)    Platelets 119 (*)    Neutro Abs 10.6 (*)    Monocytes Absolute 1.3 (*)    All other components within normal limits  URINALYSIS, ROUTINE W REFLEX MICROSCOPIC - Abnormal; Notable for the following components:   APPearance CLOUDY (*)    Hgb urine dipstick LARGE (*)    Ketones, ur 15 (*)    Protein, ur 30 (*)    Leukocytes, UA MODERATE (*)    All other components within normal limits  TROPONIN I - Abnormal; Notable for the following components:   Troponin I 0.03 (*)    All other components within normal limits  URINALYSIS, MICROSCOPIC (REFLEX) - Abnormal; Notable for the following components:   Bacteria, UA MANY (*)    All other components within normal  limits  MAGNESIUM - Abnormal; Notable for the following components:   Magnesium 1.1 (*)    All other components within normal limits  I-STAT CG4 LACTIC ACID, ED - Abnormal; Notable for the following components:   Lactic Acid, Venous 2.04 (*)    All other components within normal limits  CULTURE, BLOOD (ROUTINE X 2)  CULTURE, BLOOD (ROUTINE X 2)  PROTIME-INR  TROPONIN I  I-STAT CG4 LACTIC ACID, ED    EKG EKG Interpretation  Date/Time:  Thursday July 23 2017 12:48:40 EDT Ventricular Rate:  134 PR Interval:    QRS Duration: 200 QT Interval:  411 QTC Calculation: 614 R Axis:   23 Text Interpretation:  Sinus tachycardia Nonspecific intraventricular conduction delay RSR' in III Baseline wander in lead(s) V1 Confirmed by Tanna Furry [phone]) on 07/23/2017 1:02:27 PM   Radiology Dg Chest 2 View  Result Date: 07/23/2017 CLINICAL DATA:  Fevers and vomiting EXAM: CHEST - 2 VIEW COMPARISON:  02/22/2016 FINDINGS: The heart size and mediastinal contours are within normal limits. Both lungs are clear. The visualized skeletal structures are unremarkable. IMPRESSION: No active cardiopulmonary disease. Electronically Signed   By: Inez Catalina M.D.   On: 07/23/2017 13:57    Procedures .Critical Care Performed by: Joanne Gavel, PA-C Authorized by: Joanne Gavel, PA-C   Critical care provider statement:    Critical care time (minutes):  55   Critical care time was exclusive of:  Separately billable procedures and treating other patients and teaching time   Critical care was necessary to treat or prevent imminent or life-threatening deterioration of the following conditions:  Sepsis   Critical care was time spent personally by me on the following activities:  Ordering and performing treatments and interventions, ordering and review of radiographic studies, ordering and review of laboratory studies, re-evaluation of patient's condition, review of old charts, examination of patient, development  of treatment plan with patient or surrogate and obtaining history from patient or surrogate   (including critical care time)  Medications Ordered in ED Medications  vancomycin (VANCOCIN) 1000 MG powder (  Not Given 07/23/17 1330)  vancomycin (VANCOCIN) 500 MG powder (  Not Given 07/23/17 1330)  ceFEPIme (MAXIPIME) 2 g injection (  Not Given 07/23/17 1330)  vancomycin (  VANCOCIN) IVPB 1000 mg/200 mL premix (has no administration in time range)  ceFEPIme (MAXIPIME) 1 g in sodium chloride 0.9 % 100 mL IVPB (has no administration in time range)  LORazepam (ATIVAN) injection 0.5 mg (0.5 mg Intravenous Given 07/23/17 1331)  sodium chloride 0.9 % bolus 500 mL (0 mLs Intravenous Stopped 07/23/17 1400)  vancomycin (VANCOCIN) 1,500 mg in sodium chloride 0.9 % 500 mL IVPB (0 mg Intravenous Stopped 07/23/17 1450)  ceFEPIme (MAXIPIME) 2 g in sodium chloride 0.9 % 100 mL IVPB (0 g Intravenous Stopped 07/23/17 1430)  acetaminophen (TYLENOL) suppository 650 mg (650 mg Rectal Given 07/23/17 1358)  acetaminophen (TYLENOL) suppository 325 mg (325 mg Rectal Given 07/23/17 1358)  sodium chloride 0.9 % bolus 1,000 mL (0 mLs Intravenous Stopped 07/23/17 1545)     Initial Impression / Assessment and Plan / ED Course  I have reviewed the triage vital signs and the nursing notes.  Pertinent labs & imaging results that were available during my care of the patient were reviewed by me and considered in my medical decision making (see chart for details).  Clinical Course as of Jul 23 1728  Thu Jul 23, 2017  1501 Patient was given 975 mg of Tylenol suppositories ~14:00. Rectal temp at 14:30 102.01->103.2 at 14:54.  The patient's PCP is affiliated with [facility].  She is requesting to see if any inpatient beds are available at [facility].   [MM]    Clinical Course User Index [MM] Aadya Kindler, Laymond Purser, PA-C    62 year old female with a h/o Lupus and chronic kidney disease s/p left nephrectomy and splenectomy  who presents to the Emergency Department with a chief complaint of confusion, abdominal pain, nausea, vomiting, headache, dysuria, and urinary urgency.  Patient was seen and evaluated Dr. Jeneen Rinks, attending physician.  Febrile to 100.4 orally on arrival.  Tachycardic in the 140s.  Initial lactate 2.04.  Leukocytosis of 13.4.  Code sepsis initiated.  Antibiotics initiated with cefepime and vancomycin given the patient's history of left nephrectomy and CKD instead of Zosyn for initial unknown source.  Chest x-ray is clear.  CBC with toxic granules and dohle bodies.  Her urine is consistent with infection.  On exam, she is diffusely tender to palpation to the back and abdomen.  She is actively having rigors.  She is diaphoretic and appears uncomfortable.   EKG with sinus tachycardia, but QTC appears prolonged at 614.  Ativan given for nausea.  The patient has had no episodes of emesis since arrival.  She was given thousand milligrams of Tylenol by rectum.  45 minutes after Tylenol administration, the patient was still febrile to 102 by rectum, increasing to 103.4 approximately an hour after Tylenol administration.  The patient preferred to be admitted to [facility], spoke with the transfer coordinator who said no beds were available.  Consult to the hospitalist at [facility] and spoke with Dr. Nevada Crane who will admit the patient for sepsis.   Repeat lactate is normal.  Although the patient has remained febrile to 103, heart rate has improved to 110-120s.  She is received 1.5 L of fluid.  She remains normotensive.  At 16:42, the patient was rechecked.  Cardiac monitor was concerning for QTC in the 690s.  Repeat troponin and magnesium levels are pending.  Repeat EKG was verified by Dr. Rogene Houston, Dr. Jeneen Rinks oncoming physician replacement.  QTC unchanged and she has not been given any QTC prolonging agents since arrival in the ED.   The  patient is critically ill with one or more organ systems acutely  impaired such that there is a high probability of imminent or life threatening deterioration in the patient's conditions. As such, 55 minutes of critical care time was performed.   Final Clinical Impressions(s) / ED Diagnoses   Final diagnoses:  Sepsis, due to unspecified organism [facility])  Pyelonephritis    ED Discharge Orders    None       Joanne Gavel, PA-C 07/23/17 1730    Tanna Furry, MD 07/25/17 0710

## 2017-07-23 NOTE — Progress Notes (Signed)
62 yo F with PMH significant for lupus, CKD3, Left nephrectomy, splenectomy, HTN, presented to ED [facility] from home with AMS x 1 day. Previously taking care of all her ADLs including driving. Bacteriuria, tachycardic with leukocytosis. Started on IV antibiotics in the ED empirically. Also AKI on CKD3, prolonged QTC with no know prior cardiac hx.  Will admit for urosepsis as inpt status in the telemetry unit.

## 2017-07-24 DIAGNOSIS — N184 Chronic kidney disease, stage 4 (severe): Secondary | ICD-10-CM

## 2017-07-24 DIAGNOSIS — R7881 Bacteremia: Secondary | ICD-10-CM

## 2017-07-24 LAB — BLOOD CULTURE ID PANEL (REFLEXED)
Acinetobacter baumannii: NOT DETECTED
CANDIDA GLABRATA: NOT DETECTED
CANDIDA KRUSEI: NOT DETECTED
Candida albicans: NOT DETECTED
Candida parapsilosis: NOT DETECTED
Candida tropicalis: NOT DETECTED
Carbapenem resistance: NOT DETECTED
ENTEROBACTERIACEAE SPECIES: DETECTED — AB
ENTEROCOCCUS SPECIES: NOT DETECTED
ESCHERICHIA COLI: NOT DETECTED
Enterobacter cloacae complex: NOT DETECTED
Haemophilus influenzae: NOT DETECTED
KLEBSIELLA OXYTOCA: NOT DETECTED
Klebsiella pneumoniae: DETECTED — AB
LISTERIA MONOCYTOGENES: NOT DETECTED
NEISSERIA MENINGITIDIS: NOT DETECTED
PSEUDOMONAS AERUGINOSA: NOT DETECTED
Proteus species: NOT DETECTED
SERRATIA MARCESCENS: NOT DETECTED
STAPHYLOCOCCUS AUREUS BCID: NOT DETECTED
STAPHYLOCOCCUS SPECIES: NOT DETECTED
STREPTOCOCCUS PNEUMONIAE: NOT DETECTED
Streptococcus agalactiae: NOT DETECTED
Streptococcus pyogenes: NOT DETECTED
Streptococcus species: NOT DETECTED

## 2017-07-24 LAB — BASIC METABOLIC PANEL
Anion gap: 6 (ref 5–15)
BUN: 46 mg/dL — AB (ref 6–20)
CHLORIDE: 117 mmol/L — AB (ref 101–111)
CO2: 18 mmol/L — AB (ref 22–32)
CREATININE: 3.27 mg/dL — AB (ref 0.44–1.00)
Calcium: 7.4 mg/dL — ABNORMAL LOW (ref 8.9–10.3)
GFR calc Af Amer: 17 mL/min — ABNORMAL LOW (ref >60)
GFR calc non Af Amer: 14 mL/min — ABNORMAL LOW (ref >60)
GLUCOSE: 154 mg/dL — AB (ref 65–99)
Potassium: 4.3 mmol/L (ref 3.5–5.1)
Sodium: 141 mmol/L (ref 135–145)

## 2017-07-24 LAB — CBC
HEMATOCRIT: 30.5 % — AB (ref 36.0–46.0)
Hemoglobin: 9.5 g/dL — ABNORMAL LOW (ref 12.0–15.0)
MCH: 22.8 pg — AB (ref 26.0–34.0)
MCHC: 31.1 g/dL (ref 30.0–36.0)
MCV: 73.1 fL — AB (ref 78.0–100.0)
Platelets: 82 10*3/uL — ABNORMAL LOW (ref 150–400)
RBC: 4.17 MIL/uL (ref 3.87–5.11)
RDW: 17.6 % — ABNORMAL HIGH (ref 11.5–15.5)
WBC: 18.1 10*3/uL — ABNORMAL HIGH (ref 4.0–10.5)

## 2017-07-24 LAB — HIV ANTIBODY (ROUTINE TESTING W REFLEX): HIV Screen 4th Generation wRfx: NONREACTIVE

## 2017-07-24 LAB — MRSA PCR SCREENING: MRSA BY PCR: NEGATIVE

## 2017-07-24 LAB — TROPONIN I
Troponin I: 0.05 ng/mL (ref <0.03)
Troponin I: 0.04 ng/mL (ref <0.03)

## 2017-07-24 LAB — MAGNESIUM: Magnesium: 2.2 mg/dL (ref 1.7–2.4)

## 2017-07-24 MED ORDER — ZOLPIDEM TARTRATE 5 MG PO TABS
5.0000 mg | ORAL_TABLET | Freq: Every evening | ORAL | Status: DC | PRN
  Administered 2017-07-24 – 2017-07-26 (×4): 5 mg via ORAL
  Filled 2017-07-24 (×4): qty 1

## 2017-07-24 MED ORDER — SODIUM CHLORIDE 0.9 % IV SOLN
INTRAVENOUS | Status: DC
  Administered 2017-07-24 (×2): via INTRAVENOUS

## 2017-07-24 NOTE — Progress Notes (Signed)
CRITICAL VALUE ALERT  Critical Value:  Trop 0.05  Date & Time Notied:  07/24/2017 0009  Provider Notified: [place]  Orders Received/Actions taken: no new orders given

## 2017-07-24 NOTE — Progress Notes (Signed)
PHARMACY - PHYSICIAN COMMUNICATION CRITICAL VALUE ALERT - BLOOD CULTURE IDENTIFICATION (BCID)  Carolyn Zamora is an 63 y.o. female who presented to [facility] on 07/23/2017 with a chief complaint of sepsis  Assessment:  Likely urinary source (include suspected source if known)  Name of physician (or Provider) ContactedAlger Memos, NP  Current antibiotics: Cefepime and Vancomycin  Changes to prescribed antibiotics recommended:  Consider narrowing to [place] 2 gm IV q24h for clinically appropriate  Results for orders placed or performed during the hospital encounter of 07/23/17  Blood Culture ID Panel (Reflexed) (Collected: 07/23/2017  1:00 PM)  Result Value Ref Range   Enterococcus species NOT DETECTED NOT DETECTED   Listeria monocytogenes NOT DETECTED NOT DETECTED   Staphylococcus species NOT DETECTED NOT DETECTED   Staphylococcus aureus NOT DETECTED NOT DETECTED   Streptococcus species NOT DETECTED NOT DETECTED   Streptococcus agalactiae NOT DETECTED NOT DETECTED   Streptococcus pneumoniae NOT DETECTED NOT DETECTED   Streptococcus pyogenes NOT DETECTED NOT DETECTED   Acinetobacter baumannii NOT DETECTED NOT DETECTED   Enterobacteriaceae species DETECTED (A) NOT DETECTED   Enterobacter cloacae complex NOT DETECTED NOT DETECTED   Escherichia coli NOT DETECTED NOT DETECTED   Klebsiella oxytoca NOT DETECTED NOT DETECTED   Klebsiella pneumoniae DETECTED (A) NOT DETECTED   Proteus species NOT DETECTED NOT DETECTED   Serratia marcescens NOT DETECTED NOT DETECTED   Carbapenem resistance NOT DETECTED NOT DETECTED   Haemophilus influenzae NOT DETECTED NOT DETECTED   Neisseria meningitidis NOT DETECTED NOT DETECTED   Pseudomonas aeruginosa NOT DETECTED NOT DETECTED   Candida albicans NOT DETECTED NOT DETECTED   Candida glabrata NOT DETECTED NOT DETECTED   Candida krusei NOT DETECTED NOT DETECTED   Candida parapsilosis NOT DETECTED NOT DETECTED   Candida tropicalis NOT DETECTED  NOT DETECTED    Dorrene German 07/24/2017  5:53 AM

## 2017-07-24 NOTE — Progress Notes (Signed)
PROGRESS NOTE  Carolyn Zamora KZL:[id number] DOB: 25-Feb-1955 DOA: 07/23/2017 PCP: Beckie Salts, MD  HPI/Recap of past 24 hours: Carolyn Zamora is a 62 y.o. female with medical history significant for lupus, HTN, CKD IV and kidney cancer s/p left nephrectomy, splenectomy.  Symptoms started 3 days ago with abdominal pain and several episodes of vomiting.  Also with dysuria and urgency of 3 days duration. Patient reports fever and chills at home, associated poor p.o. Intake. Patient reports chest pain, constant , described as burning at the center of her chest radiating down to her abdomen, the started PTA. In the ED, pt was febrile up to 103.4, tachycardic to 144, WBC 13.4, creatinine about baseline 3.9, elevated BUN 49, bicarb 18, magnesium low 1.1, UA moderate leukocytes many bacteria, EKG mostly unchanged from prior but with prolonged QTC up to 644 on repeat.  Lactic acid 2.04>> 1.25. Patient was given 1.5 L normal saline bolus, IV vancomycin and cefepime. Pt admitted for further management.  Today, pt reported feeling slightly better, still feels nauseated, with some lower abdominal pain. Noted to have chills, no fever noted this am.   Assessment/Plan: Principal Problem:   Sepsis ([place]) Active Problems:   Hypertension   Lupus ([place])   CKD (chronic kidney disease), stage IV ([facility])  Sepsis with klebsiella pneumoniae bacteremia Likely urinary source On presentation, fever 103.4, tachycardic, leukocytosis Currently afebrile, with leukocytosis LA 2.04-->1.25 BC X 2 growing klebsiella, will repeat in am UA suggestive with mod leukocytes, many bacteria, >50 WBC UC wasn't collected in the ED, currently pending S/P IVF, continue gentle hydration Continue IV cefepime, dc vancomycin Immunocompromised on chronic low-dose prednisone and mycophenolate, will hold Continue stress dose steroids -hydrocortisone 50 3 times daily X 6 doses and taper  UTI UA shows mod leuk, many bacteria, >50  WBC UC pending  Acute Metabolic encephalopathy Resolved Likely secondary to sepsis   CKD stage 4 Creatinine at baseline ~3.69 Daily BMP  Mild troponin leak Chest pain free Flat trend- 0.03>0.04>0.05>0.04 EKG Long QTC otherwise no significant change from prior Monitor on tele  Prolonged QTC Chronically elevated, 2017 EKG QTC 528.  Repeated EKGs in ED QTC up to 644 Not on QTC prolonging medications Replete electrolytes prn Monitor on telemetry  Hypomagnesemia Replace prn  Microcytic anemia likely iron def with CKD Also ??dilutional 12.1>10.7>9.5 Anemia panel, FOBT pending Daily CBC Hold DVT prophylaxis for now  Thrombocytopenia Monitor closely  Lupus Home medications Plaquenil, chronic steroids 10 mg prednisone daily, mycophenolate. Continue stress dose steroids -hydrocortisone 50 3 times daily X 6 doses, and consider taper Hold home immunoppressants mycophenolate for now considering severity of infection Continue home oxycodone      Code Status: Full  Family Communication: None at bedside  Disposition Plan: Home once stable   Consultants:  None  Procedures:  None   Antimicrobials:  IV Cefepime  DVT prophylaxis:  SCDs   Objective: Vitals:   07/24/17 0434 07/24/17 0435 07/24/17 0500 07/24/17 1348  BP: (!) 69/59 100/88  132/74  Pulse: 89 90  88  Resp: 20   16  Temp: 98 F (36.7 C)  97.6 F (36.4 C) 98.5 F (36.9 C)  TempSrc: Oral   Oral  SpO2: 100% 100%  100%  Weight:      Height:        Intake/Output Summary (Last 24 hours) at 07/24/2017 1413 Last data filed at 07/24/2017 1350 Gross per 24 hour  Intake 3418.33 ml  Output 225 ml  Net 3193.33 ml  Filed Weights   07/23/17 1236  Weight: 74.8 kg (165 lb)    Exam:   General:  NAD  Cardiovascular: S1, S2 present   Respiratory: CTAB   Abdomen: Soft, ND, mild tenderness at lower quadrants, BS present   Musculoskeletal: No pedal edema bilaterally  Skin: Diffuse  hyperpigmented lesions (likely from lupus)  Psychiatry: Normal mood    Data Reviewed: CBC: Recent Labs  Lab 07/23/17 1301 07/24/17 0450  WBC 13.4* 18.1*  NEUTROABS 10.6*  --   HGB 10.7* 9.5*  HCT 33.2* 30.5*  MCV 69.7* 73.1*  PLT 119* 82*   Basic Metabolic Panel: Recent Labs  Lab 07/23/17 1301 07/23/17 1652 07/23/17 2244 07/24/17 0450  NA 139  --   --  141  K 3.8  --   --  4.3  CL 108  --   --  117*  CO2 18*  --   --  18*  GLUCOSE 110*  --   --  154*  BUN 49*  --   --  46*  CREATININE 3.92*  --   --  3.27*  CALCIUM 8.1*  --   --  7.4*  MG  --  1.1* 2.2  --    GFR: Estimated Creatinine Clearance: 16.9 mL/min (A) (by C-G formula based on SCr of 3.27 mg/dL (H)). Liver Function Tests: Recent Labs  Lab 07/23/17 1301  AST 66*  ALT 43  ALKPHOS 111  BILITOT 0.7  PROT 6.3*  ALBUMIN 3.2*   No results for input(s): LIPASE, AMYLASE in the last 168 hours. No results for input(s): AMMONIA in the last 168 hours. Coagulation Profile: Recent Labs  Lab 07/23/17 1301  INR 1.03   Cardiac Enzymes: Recent Labs  Lab 07/23/17 1308 07/23/17 1652 07/23/17 2244 07/24/17 0450  TROPONINI 0.03* 0.04* 0.05* 0.04*   BNP (last 3 results) No results for input(s): PROBNP in the last 8760 hours. HbA1C: No results for input(s): HGBA1C in the last 72 hours. CBG: No results for input(s): GLUCAP in the last 168 hours. Lipid Profile: No results for input(s): CHOL, HDL, LDLCALC, TRIG, CHOLHDL, LDLDIRECT in the last 72 hours. Thyroid Function Tests: No results for input(s): TSH, T4TOTAL, FREET4, T3FREE, THYROIDAB in the last 72 hours. Anemia Panel: No results for input(s): VITAMINB12, FOLATE, FERRITIN, TIBC, IRON, RETICCTPCT in the last 72 hours. Urine analysis:    Component Value Date/Time   COLORURINE YELLOW 07/23/2017 1243   APPEARANCEUR CLOUDY (A) 07/23/2017 1243   LABSPEC 1.025 07/23/2017 1243   PHURINE 6.0 07/23/2017 1243   GLUCOSEU NEGATIVE 07/23/2017 1243   HGBUR  LARGE (A) 07/23/2017 1243   BILIRUBINUR NEGATIVE 07/23/2017 1243   KETONESUR 15 (A) 07/23/2017 1243   PROTEINUR 30 (A) 07/23/2017 1243   NITRITE NEGATIVE 07/23/2017 1243   LEUKOCYTESUR MODERATE (A) 07/23/2017 1243   Sepsis Labs: @LABRCNTIP (procalcitonin:4,lacticidven:4)  ) Recent Results (from the past 240 hour(s))  Culture, blood (Routine x 2)     Status: None (Preliminary result)   Collection Time: 07/23/17  1:00 PM  Result Value Ref Range Status   Specimen Description   Final    BLOOD LEFT ANTECUBITAL Performed at [facility], [address] [place], [place] [address]    Special Requests   Final    BOTTLES DRAWN AEROBIC AND ANAEROBIC Blood Culture adequate volume Performed at [facility], [address] [place], [place] [address]    Culture  Setup Time   Final    IN BOTH AEROBIC AND  ANAEROBIC BOTTLES GRAM NEGATIVE RODS Organism ID to follow CRITICAL RESULT CALLED TO, READ BACK BY AND VERIFIED WITH: B GREEN PHARMD 07/24/17 0549 JDW Performed at [facility], [address], [place], [place] [address]    Culture GRAM NEGATIVE RODS  Final   Report Status PENDING  Incomplete  Blood Culture ID Panel (Reflexed)     Status: Abnormal   Collection Time: 07/23/17  1:00 PM  Result Value Ref Range Status   Enterococcus species NOT DETECTED NOT DETECTED Final   Listeria monocytogenes NOT DETECTED NOT DETECTED Final   Staphylococcus species NOT DETECTED NOT DETECTED Final   Staphylococcus aureus NOT DETECTED NOT DETECTED Final   Streptococcus species NOT DETECTED NOT DETECTED Final   Streptococcus agalactiae NOT DETECTED NOT DETECTED Final   Streptococcus pneumoniae NOT DETECTED NOT DETECTED Final   Streptococcus pyogenes NOT DETECTED NOT DETECTED Final   Acinetobacter baumannii NOT DETECTED NOT DETECTED Final   Enterobacteriaceae species DETECTED (A) NOT DETECTED Final    Comment: Enterobacteriaceae represent a large family of gram-negative bacteria,  not a single organism. CRITICAL RESULT CALLED TO, READ BACK BY AND VERIFIED WITH: B GREEN PHARMD 07/24/17 0549 JDW    Enterobacter cloacae complex NOT DETECTED NOT DETECTED Final   Escherichia coli NOT DETECTED NOT DETECTED Final   Klebsiella oxytoca NOT DETECTED NOT DETECTED Final   Klebsiella pneumoniae DETECTED (A) NOT DETECTED Final    Comment: CRITICAL RESULT CALLED TO, READ BACK BY AND VERIFIED WITH: B GREEN PHARMD 07/24/17 0549 JDW    Proteus species NOT DETECTED NOT DETECTED Final   Serratia marcescens NOT DETECTED NOT DETECTED Final   Carbapenem resistance NOT DETECTED NOT DETECTED Final   Haemophilus influenzae NOT DETECTED NOT DETECTED Final   Neisseria meningitidis NOT DETECTED NOT DETECTED Final   Pseudomonas aeruginosa NOT DETECTED NOT DETECTED Final   Candida albicans NOT DETECTED NOT DETECTED Final   Candida glabrata NOT DETECTED NOT DETECTED Final   Candida krusei NOT DETECTED NOT DETECTED Final   Candida parapsilosis NOT DETECTED NOT DETECTED Final   Candida tropicalis NOT DETECTED NOT DETECTED Final    Comment: Performed at [facility], [place] [place], [place] [address]  MRSA PCR Screening     Status: None   Collection Time: 07/23/17 11:03 PM  Result Value Ref Range Status   MRSA by PCR NEGATIVE NEGATIVE Final    Comment:        The GeneXpert MRSA Assay (FDA approved for NASAL specimens only), is one component of a comprehensive MRSA colonization surveillance program. It is not intended to diagnose MRSA infection nor to guide or monitor treatment for MRSA infections. Performed at [facility], [place] [address], [place], [place] [address]       Studies: No results found.  Scheduled Meds: . hydrocortisone sod succinate (SOLU-CORTEF) inj  50 mg Intravenous Q8H  . pregabalin  150 mg Oral Daily    Continuous Infusions: . sodium chloride 100 mL/hr at 07/24/17 0913  . ceFEPime (MAXIPIME) IV Stopped (07/24/17 1253)  .  famotidine (PEPCID) IV Stopped (07/23/17 2325)     LOS: 1 day     Alma Friendly, MD [facility]  If 7PM-7AM, please contact night-coverage [email or web address] Password [email or web address] 07/24/2017, 2:13 PM

## 2017-07-24 NOTE — Progress Notes (Signed)
PT Cancellation Note  Patient Details Name: Hadeel Hillebrand MRN: [id number] DOB: 1955/06/04   Cancelled Treatment:    Reason Eval/Treat Not Completed: Fatigue/lethargy limiting ability to participate. Pt declined participation with PT on today. Will check back another day.    Weston Anna, MPT Pager: [phone]

## 2017-07-25 LAB — CBC WITH DIFFERENTIAL/PLATELET
BASOS ABS: 0 10*3/uL (ref 0.0–0.1)
Basophils Relative: 0 %
Eosinophils Absolute: 0 10*3/uL (ref 0.0–0.7)
Eosinophils Relative: 0 %
HEMATOCRIT: 26.1 % — AB (ref 36.0–46.0)
Hemoglobin: 8.3 g/dL — ABNORMAL LOW (ref 12.0–15.0)
LYMPHS ABS: 1 10*3/uL (ref 0.7–4.0)
Lymphocytes Relative: 5 %
MCH: 22.2 pg — ABNORMAL LOW (ref 26.0–34.0)
MCHC: 31.8 g/dL (ref 30.0–36.0)
MCV: 69.8 fL — ABNORMAL LOW (ref 78.0–100.0)
MONO ABS: 1 10*3/uL (ref 0.1–1.0)
Monocytes Relative: 5 %
Neutro Abs: 17.3 10*3/uL — ABNORMAL HIGH (ref 1.7–7.7)
Neutrophils Relative %: 90 %
PLATELETS: 74 10*3/uL — AB (ref 150–400)
RBC: 3.74 MIL/uL — AB (ref 3.87–5.11)
RDW: 17.5 % — AB (ref 11.5–15.5)
WBC: 19.3 10*3/uL — AB (ref 4.0–10.5)

## 2017-07-25 LAB — BASIC METABOLIC PANEL
ANION GAP: 7 (ref 5–15)
BUN: 41 mg/dL — ABNORMAL HIGH (ref 6–20)
CALCIUM: 7.5 mg/dL — AB (ref 8.9–10.3)
CO2: 17 mmol/L — ABNORMAL LOW (ref 22–32)
Chloride: 119 mmol/L — ABNORMAL HIGH (ref 101–111)
Creatinine, Ser: 2.62 mg/dL — ABNORMAL HIGH (ref 0.44–1.00)
GFR, EST AFRICAN AMERICAN: 22 mL/min — AB (ref >60)
GFR, EST NON AFRICAN AMERICAN: 19 mL/min — AB (ref >60)
Glucose, Bld: 106 mg/dL — ABNORMAL HIGH (ref 65–99)
Potassium: 3.9 mmol/L (ref 3.5–5.1)
Sodium: 143 mmol/L (ref 135–145)

## 2017-07-25 LAB — FERRITIN: FERRITIN: 352 ng/mL — AB (ref 11–307)

## 2017-07-25 LAB — URINE CULTURE: Culture: NO GROWTH

## 2017-07-25 LAB — IRON AND TIBC
Iron: 28 ug/dL (ref 28–170)
Saturation Ratios: 18 % (ref 10.4–31.8)
TIBC: 159 ug/dL — AB (ref 250–450)
UIBC: 131 ug/dL

## 2017-07-25 MED ORDER — FAMOTIDINE 20 MG PO TABS
20.0000 mg | ORAL_TABLET | Freq: Every day | ORAL | Status: DC
  Administered 2017-07-25 – 2017-07-28 (×4): 20 mg via ORAL
  Filled 2017-07-25 (×4): qty 1

## 2017-07-25 MED ORDER — SODIUM CHLORIDE 0.9 % IV SOLN
INTRAVENOUS | Status: AC
  Administered 2017-07-25: 10:00:00 via INTRAVENOUS

## 2017-07-25 NOTE — Evaluation (Signed)
Physical Therapy Evaluation Patient Details Name: Carolyn Zamora MRN: [id number] DOB: 09-22-1955 Today's Date: 07/25/2017   History of Present Illness  Carolyn Zamora is a 62 y.o. female with medical history significant for lupus, HTN, CKD IV and kidney cancer s/p left nephrectomy, splenectomy admitted 6/13 19 with urosepsis  Clinical Impression  The patient participated in  Mobility to [facility] . Requires mod assist currently. The patient has limited caregivers during day. Did not attempt ambulation due to patient stating that she needs her shoes. Pt admitted with above diagnosis. Pt currently with functional limitations due to the deficits listed below (see PT Problem List). Pt will benefit from skilled PT to increase their independence and safety with mobility to allow discharge to the venue listed below.       Follow Up Recommendations SNF(pt currently declines SNF)HHPT if DC to home. Recommend 24/7 assist.    Equipment Recommendations  None recommended by PT    Recommendations for Other Services       Precautions / Restrictions Precautions Precautions: Fall      Mobility  Bed Mobility Overal bed mobility: Needs Assistance Bed Mobility: Supine to Sit;Sit to Supine     Supine to sit: Min assist Sit to supine: Min assist   General bed mobility comments: light assist to sit upright and for legs onto bed  Transfers Overall transfer level: Needs assistance Equipment used: Rolling walker (2 wheeled) Transfers: Sit to/from [facility] Sit to Stand: Mod assist Stand pivot transfers: Mod assist       General transfer comment: Mod steady assist to pivot to [facility], extra time. stood with RW and  then pivoted to bed as patient declined to ambulate without her shoes.  Ambulation/Gait                Stairs            Wheelchair Mobility    Modified Rankin (Stroke Patients Only)       Balance Overall balance assessment: Needs  assistance Sitting-balance support: Feet supported;No upper extremity supported Sitting balance-Leahy Scale: Good     Standing balance support: During functional activity;Bilateral upper extremity supported Standing balance-Leahy Scale: Poor Standing balance comment: relies on arms                             Pertinent Vitals/Pain Pain Assessment: Faces Faces Pain Scale: Hurts even more Pain Location: generalized Pain Descriptors / Indicators: Discomfort Pain Intervention(s): RN gave pain meds during session    Home Living Family/patient expects to be discharged to:: Private residence Living Arrangements: Children Available Help at Discharge: Available PRN/intermittently Type of Home: Apartment Home Access: Stairs to enter;Level entry     Home Layout: One level Home Equipment: Environmental consultant - 4 wheels;Shower seat;Wheelchair - manual;Bedside commode Additional Comments: brother stops by daily, daughter works    Prior Function Level of Independence: Independent with assistive device(s)         Comments: reports had been driving.     Hand Dominance        Extremity/Trunk Assessment        Lower Extremity Assessment Lower Extremity Assessment: Generalized weakness    Cervical / Trunk Assessment Cervical / Trunk Assessment: Normal  Communication   Communication: No difficulties  Cognition Arousal/Alertness: Awake/alert Behavior During Therapy: WFL for tasks assessed/performed Overall Cognitive Status: Within Functional Limits for tasks assessed  General Comments      Exercises     Assessment/Plan    PT Assessment Patient needs continued PT services  PT Problem List Decreased activity tolerance;Decreased mobility;Decreased knowledge of precautions;Decreased safety awareness;Decreased knowledge of use of DME;Pain       PT Treatment Interventions DME instruction;Gait training;Functional  mobility training;Therapeutic activities;Therapeutic exercise;Patient/family education    PT Goals (Current goals can be found in the Care Plan section)  Acute Rehab PT Goals Patient Stated Goal: to walk, go home PT Goal Formulation: With patient Time For Goal Achievement: 08/08/17 Potential to Achieve Goals: Good    Frequency Min 2X/week   Barriers to discharge Decreased caregiver support      Co-evaluation               AM-PAC PT "6 Clicks" Daily Activity  Outcome Measure Difficulty turning over in bed (including adjusting bedclothes, sheets and blankets)?: A Lot Difficulty moving from lying on back to sitting on the side of the bed? : A Lot Difficulty sitting down on and standing up from a chair with arms (e.g., wheelchair, bedside commode, etc,.)?: Unable Help needed moving to and from a bed to chair (including a wheelchair)?: Total Help needed walking in hospital room?: Total Help needed climbing 3-5 steps with a railing? : Total 6 Click Score: 8    End of Session Equipment Utilized During Treatment: Gait belt Activity Tolerance: Patient limited by fatigue Patient left: in bed;with call bell/phone within reach;with nursing/sitter in room Nurse Communication: Mobility status PT Visit Diagnosis: Unsteadiness on feet (R26.81);Muscle weakness (generalized) (M62.81)    Time: 5009-3818 PT Time Calculation (min) (ACUTE ONLY): 22 min   Charges:   PT Evaluation $PT Eval Low Complexity: 1 Low     PT G CodesTresa Endo PT [phone]   Claretha Cooper 07/25/2017, 2:54 PM

## 2017-07-25 NOTE — Progress Notes (Signed)
PROGRESS NOTE  Carolyn Zamora JOA:[id number] DOB: December 04, 1955 DOA: 07/23/2017 PCP: Beckie Salts, MD  HPI/Recap of past 24 hours: Carolyn Zamora is a 62 y.o. female with medical history significant for lupus, HTN, CKD IV and kidney cancer s/p left nephrectomy, splenectomy.  Symptoms started 3 days ago with abdominal pain and several episodes of vomiting.  Also with dysuria and urgency of 3 days duration. Patient reports fever and chills at home, associated poor p.o. Intake. Patient reports chest pain, constant , described as burning at the center of her chest radiating down to her abdomen, the started PTA. In the ED, pt was febrile up to 103.4, tachycardic to 144, WBC 13.4, creatinine about baseline 3.9, elevated BUN 49, bicarb 18, magnesium low 1.1, UA moderate leukocytes many bacteria, EKG mostly unchanged from prior but with prolonged QTC up to 644 on repeat.  Lactic acid 2.04>> 1.25. Patient was given 1.5 L normal saline bolus, IV vancomycin and cefepime. Pt admitted for further management.  Today, pt reported feeling slightly better, c/o generalized body aches. Denies any chest pain, abdominal pain, SOB, N/V/D. No fever noted   Assessment/Plan: Principal Problem:   Sepsis ([place]) Active Problems:   Hypertension   Lupus (HCC)   CKD (chronic kidney disease), stage IV (HCC)  Sepsis with klebsiella pneumoniae bacteremia Likely urinary source On presentation, fever 103.4, tachycardic, leukocytosis Currently afebrile, with leukocytosis (also on stress dose steroids) LA 2.04-->1.25 BC X 2 growing klebsiella, repeat pending UA suggestive with mod leukocytes, many bacteria, >50 WBC UC wasn't collected in the ED, currently pending S/P IVF, continue gentle hydration Continue IV cefepime, dc vancomycin Immunocompromised on chronic low-dose prednisone and mycophenolate, will hold Continue stress dose steroids -hydrocortisone 50 3 times daily X 6 doses and taper  UTI UA shows mod leuk,  many bacteria, >50 WBC UC pending  Acute Metabolic encephalopathy Resolved Likely secondary to sepsis   CKD stage 4 Improving Creatinine at baseline ~?? 3.69 Daily BMP  Mild troponin leak Chest pain free Flat trend- 0.03>0.04>0.05>0.04 EKG Long QTC otherwise no significant change from prior Monitor on tele  Prolonged QTC Chronically elevated, 2017 EKG QTC 528.  Repeated EKGs in ED QTC up to 644 Not on QTC prolonging medications Replete electrolytes prn Monitor on telemetry  Hypomagnesemia Replace prn  Microcytic anemia of CKD/chronic disease Also ??dilutional 12.1>10.7>9.5>8.3 Anemia panel, iron 28, TIBC 159, sats 18, ferritin 352 FOBT pending Daily CBC Hold DVT prophylaxis for now  Thrombocytopenia Monitor closely  Lupus Home medications Plaquenil, chronic steroids 10 mg prednisone daily, mycophenolate. Continue stress dose steroids -hydrocortisone 50 3 times daily X 6 doses, and consider taper Hold home immunoppressants mycophenolate for now considering severity of infection Continue home oxycodone      Code Status: Full  Family Communication: None at bedside  Disposition Plan: PT rec SNF, pt refusing for now   Consultants:  None  Procedures:  None   Antimicrobials:  IV Cefepime  DVT prophylaxis:  SCDs   Objective: Vitals:   07/24/17 1348 07/24/17 2034 07/25/17 0524 07/25/17 1439  BP: 132/74 126/79 138/75 119/80  Pulse: 88 96 87 89  Resp: 16 16 16 16   Temp: 98.5 F (36.9 C) 98.5 F (36.9 C) 98 F (36.7 C) 98.8 F (37.1 C)  TempSrc: Oral Oral Oral Oral  SpO2: 100% 100% 96% 100%  Weight:      Height:       No intake or output data in the 24 hours ending 07/25/17 1511 Filed Weights   07/23/17  1236  Weight: 74.8 kg (165 lb)    Exam:   General:  NAD  Cardiovascular: S1, S2 present   Respiratory: CTAB   Abdomen: Soft, ND, NT, BS present   Musculoskeletal: No pedal edema bilaterally  Skin: Diffuse  hyperpigmented lesions (likely from lupus)  Psychiatry: Normal mood    Data Reviewed: CBC: Recent Labs  Lab 07/23/17 1301 07/24/17 0450 07/25/17 0450  WBC 13.4* 18.1* 19.3*  NEUTROABS 10.6*  --  17.3*  HGB 10.7* 9.5* 8.3*  HCT 33.2* 30.5* 26.1*  MCV 69.7* 73.1* 69.8*  PLT 119* 82* 74*   Basic Metabolic Panel: Recent Labs  Lab 07/23/17 1301 07/23/17 1652 07/23/17 2244 07/24/17 0450 07/25/17 0450  NA 139  --   --  141 143  K 3.8  --   --  4.3 3.9  CL 108  --   --  117* 119*  CO2 18*  --   --  18* 17*  GLUCOSE 110*  --   --  154* 106*  BUN 49*  --   --  46* 41*  CREATININE 3.92*  --   --  3.27* 2.62*  CALCIUM 8.1*  --   --  7.4* 7.5*  MG  --  1.1* 2.2  --   --    GFR: Estimated Creatinine Clearance: 21.1 mL/min (A) (by C-G formula based on SCr of 2.62 mg/dL (H)). Liver Function Tests: Recent Labs  Lab 07/23/17 1301  AST 66*  ALT 43  ALKPHOS 111  BILITOT 0.7  PROT 6.3*  ALBUMIN 3.2*   No results for input(s): LIPASE, AMYLASE in the last 168 hours. No results for input(s): AMMONIA in the last 168 hours. Coagulation Profile: Recent Labs  Lab 07/23/17 1301  INR 1.03   Cardiac Enzymes: Recent Labs  Lab 07/23/17 1308 07/23/17 1652 07/23/17 2244 07/24/17 0450  TROPONINI 0.03* 0.04* 0.05* 0.04*   BNP (last 3 results) No results for input(s): PROBNP in the last 8760 hours. HbA1C: No results for input(s): HGBA1C in the last 72 hours. CBG: No results for input(s): GLUCAP in the last 168 hours. Lipid Profile: No results for input(s): CHOL, HDL, LDLCALC, TRIG, CHOLHDL, LDLDIRECT in the last 72 hours. Thyroid Function Tests: No results for input(s): TSH, T4TOTAL, FREET4, T3FREE, THYROIDAB in the last 72 hours. Anemia Panel: Recent Labs    07/25/17 0450  FERRITIN 352*  TIBC 159*  IRON 28   Urine analysis:    Component Value Date/Time   COLORURINE YELLOW 07/23/2017 1243   APPEARANCEUR CLOUDY (A) 07/23/2017 1243   LABSPEC 1.025 07/23/2017 1243    PHURINE 6.0 07/23/2017 1243   GLUCOSEU NEGATIVE 07/23/2017 1243   HGBUR LARGE (A) 07/23/2017 1243   BILIRUBINUR NEGATIVE 07/23/2017 1243   KETONESUR 15 (A) 07/23/2017 1243   PROTEINUR 30 (A) 07/23/2017 1243   NITRITE NEGATIVE 07/23/2017 1243   LEUKOCYTESUR MODERATE (A) 07/23/2017 1243   Sepsis Labs: @LABRCNTIP (procalcitonin:4,lacticidven:4)  ) Recent Results (from the past 240 hour(s))  Culture, blood (Routine x 2)     Status: Abnormal (Preliminary result)   Collection Time: 07/23/17  1:00 PM  Result Value Ref Range Status   Specimen Description   Final    BLOOD LEFT ANTECUBITAL Performed at [facility], [address] [place], [place] [address]    Special Requests   Final    BOTTLES DRAWN AEROBIC AND ANAEROBIC Blood Culture adequate volume Performed at [facility], [address] [place], [place] [address]  Culture  Setup Time   Final    IN BOTH AEROBIC AND ANAEROBIC BOTTLES GRAM NEGATIVE RODS CRITICAL RESULT CALLED TO, READ BACK BY AND VERIFIED WITH: B GREEN PHARMD 07/24/17 0549 JDW Performed at [facility], [address], [place], [place] [address]    Culture KLEBSIELLA PNEUMONIAE (A)  Final   Report Status PENDING  Incomplete  Blood Culture ID Panel (Reflexed)     Status: Abnormal   Collection Time: 07/23/17  1:00 PM  Result Value Ref Range Status   Enterococcus species NOT DETECTED NOT DETECTED Final   Listeria monocytogenes NOT DETECTED NOT DETECTED Final   Staphylococcus species NOT DETECTED NOT DETECTED Final   Staphylococcus aureus NOT DETECTED NOT DETECTED Final   Streptococcus species NOT DETECTED NOT DETECTED Final   Streptococcus agalactiae NOT DETECTED NOT DETECTED Final   Streptococcus pneumoniae NOT DETECTED NOT DETECTED Final   Streptococcus pyogenes NOT DETECTED NOT DETECTED Final   Acinetobacter baumannii NOT DETECTED NOT DETECTED Final   Enterobacteriaceae species DETECTED (A) NOT DETECTED Final    Comment:  Enterobacteriaceae represent a large family of gram-negative bacteria, not a single organism. CRITICAL RESULT CALLED TO, READ BACK BY AND VERIFIED WITH: B GREEN PHARMD 07/24/17 0549 JDW    Enterobacter cloacae complex NOT DETECTED NOT DETECTED Final   Escherichia coli NOT DETECTED NOT DETECTED Final   Klebsiella oxytoca NOT DETECTED NOT DETECTED Final   Klebsiella pneumoniae DETECTED (A) NOT DETECTED Final    Comment: CRITICAL RESULT CALLED TO, READ BACK BY AND VERIFIED WITH: B GREEN PHARMD 07/24/17 0549 JDW    Proteus species NOT DETECTED NOT DETECTED Final   Serratia marcescens NOT DETECTED NOT DETECTED Final   Carbapenem resistance NOT DETECTED NOT DETECTED Final   Haemophilus influenzae NOT DETECTED NOT DETECTED Final   Neisseria meningitidis NOT DETECTED NOT DETECTED Final   Pseudomonas aeruginosa NOT DETECTED NOT DETECTED Final   Candida albicans NOT DETECTED NOT DETECTED Final   Candida glabrata NOT DETECTED NOT DETECTED Final   Candida krusei NOT DETECTED NOT DETECTED Final   Candida parapsilosis NOT DETECTED NOT DETECTED Final   Candida tropicalis NOT DETECTED NOT DETECTED Final    Comment: Performed at [facility], [place], [place], [place] [address]  MRSA PCR Screening     Status: None   Collection Time: 07/23/17 11:03 PM  Result Value Ref Range Status   MRSA by PCR NEGATIVE NEGATIVE Final    Comment:        The GeneXpert MRSA Assay (FDA approved for NASAL specimens only), is one component of a comprehensive MRSA colonization surveillance program. It is not intended to diagnose MRSA infection nor to guide or monitor treatment for MRSA infections. Performed at [facility], [place] [address], [place], [place] [address]   Urine Culture     Status: None   Collection Time: 07/24/17  9:17 AM  Result Value Ref Range Status   Specimen Description   Final    URINE, CLEAN CATCH Performed at [facility], [place] [address], [place], [place] [address]    Special Requests   Final    NONE Performed at [facility], [place] [address], [place], [place] [address]    Culture   Final    NO GROWTH Performed at [facility], [place] [address] [place], [place] [address]    Report Status 07/25/2017 FINAL  Final      Studies: No results found.  Scheduled Meds: . famotidine  20 mg Oral QHS  . pregabalin  150 mg Oral Daily    Continuous Infusions: . sodium chloride 75 mL/hr at 07/25/17 1023  . ceFEPime (MAXIPIME) IV 1 g (07/25/17 1432)     LOS: 2 days     Alma Friendly, MD [facility]  If 7PM-7AM, please contact night-coverage [email or web address] Password [email or web address] 07/25/2017, 3:11 PM

## 2017-07-25 NOTE — Evaluation (Signed)
Occupational Therapy Evaluation Patient Details Name: Carolyn Zamora MRN: [id number] DOB: Jun 28, 1955 Today's Date: 07/25/2017    History of Present Illness Carolyn Zamora is a 62 y.o. female with medical history significant for lupus, HTN, CKD IV and kidney cancer s/p left nephrectomy, splenectomy admitted 6/13 19 with urosepsis   Clinical Impression   This 62 yo female admitted with above presents to acute OT with decreased balance, tremors in hands, decreased mobility and generalized pain affecting her PLOF of being independent to Mod I with all basic ADLs, doing some IADLs, and driving. She will benefit from acute OT with follow up OT at SNF, (but pt declines so recommending HHOT and 24 hour S/prn A.    Follow Up Recommendations  SNF;Supervision/Assistance - 24 hour;Other (comment)(however pt declining SNF, so HHOT)    Equipment Recommendations  None recommended by OT       Precautions / Restrictions Precautions Precautions: Fall Restrictions Weight Bearing Restrictions: No      Mobility Bed Mobility Overal bed mobility: Needs Assistance Bed Mobility: Supine to Sit;Sit to Supine     Supine to sit: Min assist Sit to supine: Min assist   General bed mobility comments: light assist to sit upright and for legs onto bed  Transfers Overall transfer level: Needs assistance Equipment used: Rolling walker (2 wheeled) Transfers: Sit to/from [facility] Sit to Stand: Mod assist Stand pivot transfers: Mod assist       General transfer comment: Mod steady assist to pivot to [facility], extra time. stood with RW and  then pivoted to bed as patient declined to ambulate without her shoes.    Balance Overall balance assessment: Needs assistance Sitting-balance support: Feet supported;No upper extremity supported Sitting balance-Leahy Scale: Good     Standing balance support: During functional activity;Bilateral upper extremity supported Standing balance-Leahy  Scale: Poor Standing balance comment: relies on arms                           ADL either performed or assessed with clinical judgement   ADL Overall ADL's : Needs assistance/impaired Eating/Feeding: Independent;Sitting   Grooming: Set up;Supervision/safety;Sitting   Upper Body Bathing: Set up;Supervision/ safety;Sitting   Lower Body Bathing: Moderate assistance Lower Body Bathing Details (indicate cue type and reason): Mod A sit<>stand Upper Body Dressing : Supervision/safety;Set up;Sitting   Lower Body Dressing: Maximal assistance Lower Body Dressing Details (indicate cue type and reason): Mod A sit<>stand Toilet Transfer: Moderate assistance;Stand-pivot;BSC   Toileting- Clothing Manipulation and Hygiene: Minimal assistance Toileting - Clothing Manipulation Details (indicate cue type and reason): Mod A sit<>stand             Vision Patient Visual Report: No change from baseline              Pertinent Vitals/Pain Pain Assessment: Faces Faces Pain Scale: Hurts even more Pain Location: generalized Pain Descriptors / Indicators: Discomfort Pain Intervention(s): RN gave pain meds during session;Monitored during session     Hand Dominance  right   Extremity/Trunk Assessment Upper Extremity Assessment Upper Extremity Assessment: Generalized weakness   Lower Extremity Assessment Lower Extremity Assessment: Generalized weakness   Cervical / Trunk Assessment Cervical / Trunk Assessment: Normal   Communication Communication Communication: No difficulties   Cognition Arousal/Alertness: Awake/alert Behavior During Therapy: WFL for tasks assessed/performed Overall Cognitive Status: Within Functional Limits for tasks assessed  Home Living Family/patient expects to be discharged to:: Private residence Living Arrangements: Children Available Help at Discharge: Available  PRN/intermittently Type of Home: Apartment Home Access: Stairs to enter;Level entry     Home Layout: One level     Bathroom Shower/Tub: Walk-in shower         Home Equipment: Environmental consultant - 4 wheels;Shower seat;Wheelchair - manual;Bedside commode   Additional Comments: brother stops by daily, daughter works      Prior Functioning/Environment Level of Independence: Independent with assistive device(s)        Comments: reports had been driving.        OT Problem List: Decreased strength;Impaired balance (sitting and/or standing);Decreased knowledge of use of DME or AE      OT Treatment/Interventions: Self-care/ADL training;Balance training;Patient/family education;DME and/or AE instruction    OT Goals(Current goals can be found in the care plan section) Acute Rehab OT Goals Patient Stated Goal: to walk, go home OT Goal Formulation: With patient Time For Goal Achievement: 08/08/17 Potential to Achieve Goals: Good  OT Frequency: Min 2X/week   Barriers to D/C: (? 24/7 support)          Co-evaluation PT/OT/SLP Co-Evaluation/Treatment: Yes Reason for Co-Treatment: For patient/therapist safety;To address functional/ADL transfers   OT goals addressed during session: ADL's and self-care;Strengthening/ROM      AM-PAC PT "6 Clicks" Daily Activity     Outcome Measure Help from another person eating meals?: None Help from another person taking care of personal grooming?: A Little Help from another person toileting, which includes using toliet, bedpan, or urinal?: A Lot Help from another person bathing (including washing, rinsing, drying)?: A Lot Help from another person to put on and taking off regular upper body clothing?: A Little Help from another person to put on and taking off regular lower body clothing?: A Lot 6 Click Score: 16   End of Session Equipment Utilized During Treatment: Gait belt;Rolling walker Nurse Communication: Mobility status  Activity Tolerance:  Patient tolerated treatment well Patient left: in bed;with call bell/phone within reach;with bed alarm set(did not want to sit up in recliner)  OT Visit Diagnosis: Unsteadiness on feet (R26.81);Other abnormalities of gait and mobility (R26.89);Muscle weakness (generalized) (M62.81);Pain Pain - part of body: (generalized)                Time: 6144-3154 OT Time Calculation (min): 25 min Charges:  OT General Charges $OT Visit: 1 Visit OT Evaluation $OT Eval Moderate Complexity: [address] [place] [phone] 07/25/2017

## 2017-07-25 NOTE — Progress Notes (Signed)
PHARMACIST - PHYSICIAN COMMUNICATION  CONCERNING: IV to Oral Route Change Policy  RECOMMENDATION: This patient is receiving famotidine by the intravenous route.  Based on criteria approved by the Pharmacy and Therapeutics Committee, the intravenous medication(s) is/are being converted to the equivalent oral dose form(s).   DESCRIPTION: These criteria include:  The patient is eating (either orally or via tube) and/or has been taking other orally administered medications for a least 24 hours  The patient has no evidence of active gastrointestinal bleeding or impaired GI absorption (gastrectomy, short bowel, patient on TNA or NPO).  If you have questions about this conversion, please contact the Pharmacy Department  []   [phone] )  [facility] []   [phone] )  [facility] []   [phone] )  [facility] []   [phone] )  [facility] [x]   [phone] )  [place], [place].D. [phone] 07/25/2017 9:25 AM

## 2017-07-25 NOTE — Progress Notes (Signed)
OT Cancellation Note  Patient Details Name: Carolyn Zamora MRN: [id number] DOB: 1955-03-16   Cancelled Treatment:    Reason Eval/Treat Not Completed: Other (comment). Pt reports she is in a 8/10 pain and wants to wait until after her next pain meds at 1:20 before she works with therapy. We will try back as schedule allows.  Almon Register [phone] 07/25/2017, 12:23 PM

## 2017-07-26 LAB — CBC WITH DIFFERENTIAL/PLATELET
BASOS PCT: 0 %
Basophils Absolute: 0 10*3/uL (ref 0.0–0.1)
EOS ABS: 0 10*3/uL (ref 0.0–0.7)
EOS PCT: 0 %
HCT: 28.3 % — ABNORMAL LOW (ref 36.0–46.0)
Hemoglobin: 9 g/dL — ABNORMAL LOW (ref 12.0–15.0)
LYMPHS ABS: 1.7 10*3/uL (ref 0.7–4.0)
Lymphocytes Relative: 10 %
MCH: 22.6 pg — AB (ref 26.0–34.0)
MCHC: 31.8 g/dL (ref 30.0–36.0)
MCV: 70.9 fL — AB (ref 78.0–100.0)
MONO ABS: 1.2 10*3/uL — AB (ref 0.1–1.0)
Monocytes Relative: 7 %
NEUTROS ABS: 13.9 10*3/uL — AB (ref 1.7–7.7)
Neutrophils Relative %: 83 %
PLATELETS: 100 10*3/uL — AB (ref 150–400)
RBC: 3.99 MIL/uL (ref 3.87–5.11)
RDW: 17.4 % — ABNORMAL HIGH (ref 11.5–15.5)
WBC: 16.8 10*3/uL — ABNORMAL HIGH (ref 4.0–10.5)

## 2017-07-26 LAB — BASIC METABOLIC PANEL
Anion gap: 7 (ref 5–15)
BUN: 33 mg/dL — AB (ref 6–20)
CO2: 18 mmol/L — ABNORMAL LOW (ref 22–32)
CREATININE: 2.24 mg/dL — AB (ref 0.44–1.00)
Calcium: 8 mg/dL — ABNORMAL LOW (ref 8.9–10.3)
Chloride: 121 mmol/L — ABNORMAL HIGH (ref 101–111)
GFR calc Af Amer: 26 mL/min — ABNORMAL LOW (ref >60)
GFR, EST NON AFRICAN AMERICAN: 23 mL/min — AB (ref >60)
GLUCOSE: 87 mg/dL (ref 65–99)
POTASSIUM: 3.8 mmol/L (ref 3.5–5.1)
SODIUM: 146 mmol/L — AB (ref 135–145)

## 2017-07-26 LAB — CULTURE, BLOOD (ROUTINE X 2): SPECIAL REQUESTS: ADEQUATE

## 2017-07-26 MED ORDER — SODIUM CHLORIDE 0.9 % IV SOLN
2.0000 g | INTRAVENOUS | Status: DC
  Administered 2017-07-26 – 2017-07-29 (×4): 2 g via INTRAVENOUS
  Filled 2017-07-26: qty 20
  Filled 2017-07-26 (×3): qty 2

## 2017-07-26 MED ORDER — AMLODIPINE BESYLATE 5 MG PO TABS
5.0000 mg | ORAL_TABLET | Freq: Every day | ORAL | Status: DC
  Administered 2017-07-26 – 2017-07-29 (×4): 5 mg via ORAL
  Filled 2017-07-26 (×4): qty 1

## 2017-07-26 MED ORDER — HYDROXYZINE HCL 50 MG PO TABS
50.0000 mg | ORAL_TABLET | Freq: Three times a day (TID) | ORAL | Status: DC | PRN
  Administered 2017-07-26: 50 mg via ORAL
  Filled 2017-07-26: qty 1

## 2017-07-26 MED ORDER — TRAZODONE HCL 50 MG PO TABS
50.0000 mg | ORAL_TABLET | Freq: Every day | ORAL | Status: DC
  Administered 2017-07-27 – 2017-07-28 (×2): 50 mg via ORAL
  Filled 2017-07-26 (×3): qty 1

## 2017-07-26 NOTE — Progress Notes (Signed)
PROGRESS NOTE  Carolyn Zamora BTD:[id number] DOB: [birth year] DOA: 07/23/2017 PCP: Beckie Salts, MD  HPI/Recap of past 24 hours: Carolyn Zamora is a 62 y.o. female with medical history significant for lupus, HTN, CKD IV and kidney cancer s/p left nephrectomy, splenectomy.  Symptoms started 3 days ago with abdominal pain and several episodes of vomiting.  Also with dysuria and urgency of 3 days duration. Patient reports fever and chills at home, associated poor p.o. Intake. Patient reports chest pain, constant , described as burning at the center of her chest radiating down to her abdomen, the started PTA. In the ED, pt was febrile up to 103.4, tachycardic to 144, WBC 13.4, creatinine about baseline 3.9, elevated BUN 49, bicarb 18, magnesium low 1.1, UA moderate leukocytes many bacteria, EKG mostly unchanged from prior but with prolonged QTC up to 644 on repeat.  Lactic acid 2.04>> 1.25. Patient was given 1.5 L normal saline bolus, IV vancomycin and cefepime. Pt admitted for further management.  Today, pt reported feeling slightly better, c/o generalized body aches. Denies any chest pain, abdominal pain, SOB, N/V/D. No fever noted   Assessment/Plan: Principal Problem:   Sepsis ([place]) Active Problems:   Hypertension   Lupus ([place])   CKD (chronic kidney disease), stage IV ([facility])  Sepsis with klebsiella pneumoniae bacteremia Likely urinary source On presentation, fever 103.4, tachycardic, leukocytosis Currently afebrile, with leukocytosis (also on stress dose steroids) LA 2.04-->1.25 BC X 2 growing klebsiella, repeat NGTD UA suggestive with mod leukocytes, many bacteria, >50 WBC UC with no growth (collected after antibiotics was started) S/P IVF Change to IV Ceftriaxone Immunocompromised on chronic low-dose prednisone and mycophenolate, will hold Continue stress dose steroids -hydrocortisone 50 3 times daily X 6 doses and taper  UTI UA shows mod leuk, many bacteria, >50 WBC UC  with no growth (collected after antibiotics was started)  Acute Metabolic encephalopathy Resolved Likely secondary to sepsis   CKD stage 4 Improving Creatinine at baseline ~?? 3.69 Daily BMP  HTN Controlled Decrease home Norvasc to 5 mg dialy  Mild troponin leak Chest pain free Flat trend- 0.03>0.04>0.05>0.04 EKG Long QTC otherwise no significant change from prior Monitor on tele  Prolonged QTC Chronically elevated, 2017 EKG QTC 528.  Repeated EKGs in ED QTC up to 644 Not on QTC prolonging medications Replete electrolytes prn Monitor on telemetry  Hypomagnesemia Replace prn  Microcytic anemia of CKD/chronic disease Also ??dilutional 12.1>10.7>9.5>8.3 Anemia panel, iron 28, TIBC 159, sats 18, ferritin 352 FOBT pending Daily CBC Hold DVT prophylaxis for now  Thrombocytopenia Monitor closely  Lupus Home medications Plaquenil, chronic steroids 10 mg prednisone daily, mycophenolate. Continue stress dose steroids -hydrocortisone 50 3 times daily X 6 doses, and consider taper Hold home immunoppressants mycophenolate for now considering severity of infection Continue home oxycodone      Code Status: Full  Family Communication: None at bedside  Disposition Plan: PT rec SNF, pt refusing for now   Consultants:  None  Procedures:  None   Antimicrobials:  IV Ceftriaxone  DVT prophylaxis:  SCDs   Objective: Vitals:   07/25/17 2112 07/26/17 0554 07/26/17 0918 07/26/17 1257  BP: (!) 146/95 (!) 155/74 119/69 (!) 143/80  Pulse: 91 84 (!) 107 85  Resp: 18 19  20   Temp: 97.9 F (36.6 C) 99.3 F (37.4 C)  99 F (37.2 C)  TempSrc: Oral Oral  Oral  SpO2: 100% 100% 100% 100%  Weight:      Height:        Intake/Output  Summary (Last 24 hours) at 07/26/2017 1411 Last data filed at 07/26/2017 1300 Gross per 24 hour  Intake 481.25 ml  Output 1300 ml  Net -818.75 ml   Filed Weights   07/23/17 1236  Weight: 74.8 kg (165 lb)     Exam:   General:  NAD  Cardiovascular: S1, S2 present   Respiratory: CTAB   Abdomen: Soft, ND, NT, BS present   Musculoskeletal: No pedal edema bilaterally  Skin: Diffuse hyperpigmented lesions (likely from lupus)  Psychiatry: Normal mood    Data Reviewed: CBC: Recent Labs  Lab 07/23/17 1301 07/24/17 0450 07/25/17 0450 07/26/17 0430  WBC 13.4* 18.1* 19.3* 16.8*  NEUTROABS 10.6*  --  17.3* 13.9*  HGB 10.7* 9.5* 8.3* 9.0*  HCT 33.2* 30.5* 26.1* 28.3*  MCV 69.7* 73.1* 69.8* 70.9*  PLT 119* 82* 74* 301*   Basic Metabolic Panel: Recent Labs  Lab 07/23/17 1301 07/23/17 1652 07/23/17 2244 07/24/17 0450 07/25/17 0450 07/26/17 0430  NA 139  --   --  141 143 146*  K 3.8  --   --  4.3 3.9 3.8  CL 108  --   --  117* 119* 121*  CO2 18*  --   --  18* 17* 18*  GLUCOSE 110*  --   --  154* 106* 87  BUN 49*  --   --  46* 41* 33*  CREATININE 3.92*  --   --  3.27* 2.62* 2.24*  CALCIUM 8.1*  --   --  7.4* 7.5* 8.0*  MG  --  1.1* 2.2  --   --   --    GFR: Estimated Creatinine Clearance: 24.7 mL/min (A) (by C-G formula based on SCr of 2.24 mg/dL (H)). Liver Function Tests: Recent Labs  Lab 07/23/17 1301  AST 66*  ALT 43  ALKPHOS 111  BILITOT 0.7  PROT 6.3*  ALBUMIN 3.2*   No results for input(s): LIPASE, AMYLASE in the last 168 hours. No results for input(s): AMMONIA in the last 168 hours. Coagulation Profile: Recent Labs  Lab 07/23/17 1301  INR 1.03   Cardiac Enzymes: Recent Labs  Lab 07/23/17 1308 07/23/17 1652 07/23/17 2244 07/24/17 0450  TROPONINI 0.03* 0.04* 0.05* 0.04*   BNP (last 3 results) No results for input(s): PROBNP in the last 8760 hours. HbA1C: No results for input(s): HGBA1C in the last 72 hours. CBG: No results for input(s): GLUCAP in the last 168 hours. Lipid Profile: No results for input(s): CHOL, HDL, LDLCALC, TRIG, CHOLHDL, LDLDIRECT in the last 72 hours. Thyroid Function Tests: No results for input(s): TSH, T4TOTAL,  FREET4, T3FREE, THYROIDAB in the last 72 hours. Anemia Panel: Recent Labs    07/25/17 0450  FERRITIN 352*  TIBC 159*  IRON 28   Urine analysis:    Component Value Date/Time   COLORURINE YELLOW 07/23/2017 1243   APPEARANCEUR CLOUDY (A) 07/23/2017 1243   LABSPEC 1.025 07/23/2017 1243   PHURINE 6.0 07/23/2017 1243   GLUCOSEU NEGATIVE 07/23/2017 1243   HGBUR LARGE (A) 07/23/2017 1243   BILIRUBINUR NEGATIVE 07/23/2017 1243   KETONESUR 15 (A) 07/23/2017 1243   PROTEINUR 30 (A) 07/23/2017 1243   NITRITE NEGATIVE 07/23/2017 1243   LEUKOCYTESUR MODERATE (A) 07/23/2017 1243   Sepsis Labs: @LABRCNTIP (procalcitonin:4,lacticidven:4)  ) Recent Results (from the past 240 hour(s))  Culture, blood (Routine x 2)     Status: Abnormal   Collection Time: 07/23/17  1:00 PM  Result Value Ref Range Status   Specimen Description   Final  BLOOD LEFT ANTECUBITAL Performed at [facility], [address] [place], [place] [address]    Special Requests   Final    BOTTLES DRAWN AEROBIC AND ANAEROBIC Blood Culture adequate volume Performed at [facility], [address] [place], [place] [address]    Culture  Setup Time   Final    IN BOTH AEROBIC AND ANAEROBIC BOTTLES GRAM NEGATIVE RODS CRITICAL RESULT CALLED TO, READ BACK BY AND VERIFIED WITH: B GREEN PHARMD 07/24/17 0549 JDW Performed at [facility], [address], [place], [place] [address]    Culture KLEBSIELLA PNEUMONIAE (A)  Final   Report Status 07/26/2017 FINAL  Final   Organism ID, Bacteria KLEBSIELLA PNEUMONIAE  Final      Susceptibility   Klebsiella pneumoniae - MIC*    AMPICILLIN >=32 RESISTANT Resistant     CEFAZOLIN <=4 SENSITIVE Sensitive     CEFEPIME <=1 SENSITIVE Sensitive     CEFTAZIDIME <=1 SENSITIVE Sensitive     CEFTRIAXONE <=1 SENSITIVE Sensitive     CIPROFLOXACIN <=0.25 SENSITIVE Sensitive     GENTAMICIN <=1 SENSITIVE Sensitive     IMIPENEM <=0.25 SENSITIVE Sensitive     TRIMETH/SULFA  <=20 SENSITIVE Sensitive     AMPICILLIN/SULBACTAM 8 SENSITIVE Sensitive     PIP/TAZO <=4 SENSITIVE Sensitive     Extended ESBL NEGATIVE Sensitive     * KLEBSIELLA PNEUMONIAE  Blood Culture ID Panel (Reflexed)     Status: Abnormal   Collection Time: 07/23/17  1:00 PM  Result Value Ref Range Status   Enterococcus species NOT DETECTED NOT DETECTED Final   Listeria monocytogenes NOT DETECTED NOT DETECTED Final   Staphylococcus species NOT DETECTED NOT DETECTED Final   Staphylococcus aureus NOT DETECTED NOT DETECTED Final   Streptococcus species NOT DETECTED NOT DETECTED Final   Streptococcus agalactiae NOT DETECTED NOT DETECTED Final   Streptococcus pneumoniae NOT DETECTED NOT DETECTED Final   Streptococcus pyogenes NOT DETECTED NOT DETECTED Final   Acinetobacter baumannii NOT DETECTED NOT DETECTED Final   Enterobacteriaceae species DETECTED (A) NOT DETECTED Final    Comment: Enterobacteriaceae represent a large family of gram-negative bacteria, not a single organism. CRITICAL RESULT CALLED TO, READ BACK BY AND VERIFIED WITH: B GREEN PHARMD 07/24/17 0549 JDW    Enterobacter cloacae complex NOT DETECTED NOT DETECTED Final   Escherichia coli NOT DETECTED NOT DETECTED Final   Klebsiella oxytoca NOT DETECTED NOT DETECTED Final   Klebsiella pneumoniae DETECTED (A) NOT DETECTED Final    Comment: CRITICAL RESULT CALLED TO, READ BACK BY AND VERIFIED WITH: B GREEN PHARMD 07/24/17 0549 JDW    Proteus species NOT DETECTED NOT DETECTED Final   Serratia marcescens NOT DETECTED NOT DETECTED Final   Carbapenem resistance NOT DETECTED NOT DETECTED Final   Haemophilus influenzae NOT DETECTED NOT DETECTED Final   Neisseria meningitidis NOT DETECTED NOT DETECTED Final   Pseudomonas aeruginosa NOT DETECTED NOT DETECTED Final   Candida albicans NOT DETECTED NOT DETECTED Final   Candida glabrata NOT DETECTED NOT DETECTED Final   Candida krusei NOT DETECTED NOT DETECTED Final   Candida parapsilosis NOT  DETECTED NOT DETECTED Final   Candida tropicalis NOT DETECTED NOT DETECTED Final    Comment: Performed at [facility], [place]. [address], [place], [place] [address]  MRSA PCR Screening     Status: None   Collection Time: 07/23/17 11:03 PM  Result Value Ref Range Status   MRSA by PCR NEGATIVE NEGATIVE Final  Comment:        The GeneXpert MRSA Assay (FDA approved for NASAL specimens only), is one component of a comprehensive MRSA colonization surveillance program. It is not intended to diagnose MRSA infection nor to guide or monitor treatment for MRSA infections. Performed at [facility], [place] [address] [place], [place] [address]   Urine Culture     Status: None   Collection Time: 07/24/17  9:17 AM  Result Value Ref Range Status   Specimen Description   Final    URINE, CLEAN CATCH Performed at [facility], [place] [address], [place], [place] [address]    Special Requests   Final    NONE Performed at [facility], [place] [address] [place], [place] [address]    Culture   Final    NO GROWTH Performed at [facility], [place] [address], [place], [place] [address]    Report Status 07/25/2017 FINAL  Final  Culture, blood (routine x 2)     Status: None (Preliminary result)   Collection Time: 07/25/17  4:50 AM  Result Value Ref Range Status   Specimen Description   Final    BLOOD LEFT WRIST Performed at [place] [address] [place], [place] [address]    Special Requests   Final    BOTTLES DRAWN AEROBIC ONLY Blood Culture results may not be optimal due to an inadequate volume of blood received in culture bottles Performed at [facility] [address] [place], [place] [address]    Culture   Final    NO GROWTH 1 DAY Performed at [facility], [place] [address], [place], [place] [address]    Report Status PENDING  Incomplete  Culture, blood (routine x 2)      Status: None (Preliminary result)   Collection Time: 07/25/17  4:50 AM  Result Value Ref Range Status   Specimen Description   Final    BLOOD BLOOD LEFT HAND Performed at [place] [address] [place], [place] [address]    Special Requests   Final    BOTTLES DRAWN AEROBIC ONLY Blood Culture results may not be optimal due to an inadequate volume of blood received in culture bottles Performed at [place] [address], [place], [place] [address]    Culture   Final    NO GROWTH 1 DAY Performed at [facility], [place] [address], [place],  [address]    Report Status PENDING  Incomplete      Studies: No results found.  Scheduled Meds: . amLODipine  5 mg Oral Daily  . famotidine  20 mg Oral QHS  . pregabalin  150 mg Oral Daily    Continuous Infusions: . cefTRIAXone (ROCEPHIN)  IV       LOS: 3 days     Alma Friendly, MD [facility]  If 7PM-7AM, please contact night-coverage [email or web address] Password [facility] 07/26/2017, 2:11 PM

## 2017-07-27 LAB — CBC WITH DIFFERENTIAL/PLATELET
BASOS PCT: 1 %
Band Neutrophils: 8 %
Basophils Absolute: 0.1 10*3/uL (ref 0.0–0.1)
Blasts: 0 %
Eosinophils Absolute: 0.1 10*3/uL (ref 0.0–0.7)
Eosinophils Relative: 1 %
HEMATOCRIT: 28.7 % — AB (ref 36.0–46.0)
HEMOGLOBIN: 8.8 g/dL — AB (ref 12.0–15.0)
LYMPHS PCT: 19 %
Lymphs Abs: 1.6 10*3/uL (ref 0.7–4.0)
MCH: 21.9 pg — AB (ref 26.0–34.0)
MCHC: 30.7 g/dL (ref 30.0–36.0)
MCV: 71.4 fL — AB (ref 78.0–100.0)
Metamyelocytes Relative: 0 %
Monocytes Absolute: 0.2 10*3/uL (ref 0.1–1.0)
Monocytes Relative: 2 %
Myelocytes: 1 %
NEUTROS PCT: 65 %
NRBC: 0 /100{WBCs}
Neutro Abs: 6.1 10*3/uL (ref 1.7–7.7)
OTHER: 3 %
PROMYELOCYTES RELATIVE: 0 %
Platelets: 104 10*3/uL — ABNORMAL LOW (ref 150–400)
RBC: 4.02 MIL/uL (ref 3.87–5.11)
RDW: 17.6 % — ABNORMAL HIGH (ref 11.5–15.5)
WBC: 8.2 10*3/uL (ref 4.0–10.5)

## 2017-07-27 LAB — BASIC METABOLIC PANEL
ANION GAP: 8 (ref 5–15)
BUN: 28 mg/dL — AB (ref 6–20)
CHLORIDE: 123 mmol/L — AB (ref 101–111)
CO2: 16 mmol/L — AB (ref 22–32)
Calcium: 7.8 mg/dL — ABNORMAL LOW (ref 8.9–10.3)
Creatinine, Ser: 1.87 mg/dL — ABNORMAL HIGH (ref 0.44–1.00)
GFR calc Af Amer: 33 mL/min — ABNORMAL LOW (ref >60)
GFR calc non Af Amer: 28 mL/min — ABNORMAL LOW (ref >60)
GLUCOSE: 67 mg/dL (ref 65–99)
POTASSIUM: 3.5 mmol/L (ref 3.5–5.1)
Sodium: 147 mmol/L — ABNORMAL HIGH (ref 135–145)

## 2017-07-27 MED ORDER — COLCHICINE 0.6 MG PO TABS
0.3000 mg | ORAL_TABLET | Freq: Every day | ORAL | Status: DC
  Administered 2017-07-28 – 2017-07-29 (×2): 0.3 mg via ORAL
  Filled 2017-07-27 (×2): qty 0.5

## 2017-07-27 MED ORDER — PREGABALIN 75 MG PO CAPS
150.0000 mg | ORAL_CAPSULE | Freq: Two times a day (BID) | ORAL | Status: DC
  Administered 2017-07-27 – 2017-07-29 (×4): 150 mg via ORAL
  Filled 2017-07-27 (×4): qty 2

## 2017-07-27 MED ORDER — COLCHICINE 0.6 MG PO TABS
0.6000 mg | ORAL_TABLET | Freq: Three times a day (TID) | ORAL | Status: AC
  Administered 2017-07-27 (×3): 0.6 mg via ORAL
  Filled 2017-07-27 (×3): qty 1

## 2017-07-27 MED ORDER — PREDNISONE 20 MG PO TABS
40.0000 mg | ORAL_TABLET | Freq: Every day | ORAL | Status: DC
  Administered 2017-07-27 – 2017-07-29 (×3): 40 mg via ORAL
  Filled 2017-07-27 (×3): qty 2

## 2017-07-27 NOTE — Progress Notes (Signed)
PT Cancellation Note  Patient Details Name: Lasaundra Riche MRN: [id number] DOB: December 02, 1955   Cancelled Treatment:    Reason Eval/Treat Not Completed: Patient declined, painful left foot with gout; painful left leg. PT attempted again after pain meds had been administer. Patient declined due to pain.    Floria Raveling. Hartnett-Rands, MS, PT [facility] #[id number] 07/27/2017, 2:48 PM

## 2017-07-27 NOTE — Progress Notes (Signed)
PROGRESS NOTE  Carolyn Zamora AQT:[id number] DOB: 07-08-1955 DOA: 07/23/2017 PCP: Beckie Salts, MD  HPI/Recap of past 24 hours: Carolyn Zamora is a 62 y.o. female with medical history significant for lupus, HTN, CKD IV and kidney cancer s/p left nephrectomy, splenectomy.  Symptoms started 3 days ago with abdominal pain and several episodes of vomiting.  Also with dysuria and urgency of 3 days duration. Patient reports fever and chills at home, associated poor p.o. Intake. Patient reports chest pain, constant , described as burning at the center of her chest radiating down to her abdomen, the started PTA. In the ED, pt was febrile up to 103.4, tachycardic to 144, WBC 13.4, creatinine about baseline 3.9, elevated BUN 49, bicarb 18, magnesium low 1.1, UA moderate leukocytes many bacteria, EKG mostly unchanged from prior but with prolonged QTC up to 644 on repeat.  Lactic acid 2.04>> 1.25. Patient was given 1.5 L normal saline bolus, IV vancomycin and cefepime. Pt admitted for further management.  Today, pt reported severe pain on L great toe which started acutely, reported having a hx of gout. Denies any chest pain, abdominal pain, SOB, N/V/D. No fever noted   Assessment/Plan: Principal Problem:   Sepsis ([place]) Active Problems:   Hypertension   Lupus ([place])   CKD (chronic kidney disease), stage IV ([facility])  Sepsis with klebsiella pneumoniae bacteremia Improving Likely urinary source On presentation, fever 103.4, tachycardic, leukocytosis Currently afebrile, resolved leukocytosis LA 2.04-->1.25 BC X 2 growing klebsiella, repeat NGTD UA suggestive with mod leukocytes, many bacteria, >50 WBC UC with no growth (collected after antibiotics was started) S/P IVF Change to IV Ceftriaxone Immunocompromised on chronic low-dose prednisone and mycophenolate, will hold Continue stress dose steroids -hydrocortisone 50 3 times daily X 6 doses and taper  UTI UA shows mod leuk, many bacteria, >50  WBC UC with no growth (collected after antibiotics was started)  Acute gout flare Severe pain on the L great toe joint Continued pred, start colchicine Uric acid pending  Acute Metabolic encephalopathy Resolved Likely secondary to sepsis   CKD stage 4 Improving Creatinine at baseline ~?? 3.69 Daily BMP  HTN Controlled Decrease home Norvasc to 5 mg dialy  Mild troponin leak Chest pain free Flat trend- 0.03>0.04>0.05>0.04 EKG Long QTC otherwise no significant change from prior Monitor on tele  Prolonged QTC Chronically elevated, 2017 EKG QTC 528.  Repeated EKGs in ED QTC up to 644 Not on QTC prolonging medications Replete electrolytes prn Monitor on telemetry  Hypomagnesemia Replace prn  Microcytic anemia of CKD/chronic disease Also ??dilutional 12.1>10.7>9.5>8.3 Anemia panel, iron 28, TIBC 159, sats 18, ferritin 352 FOBT pending Daily CBC Hold DVT prophylaxis for now  Thrombocytopenia Monitor closely  Lupus Home medications Plaquenil, chronic steroids 10 mg prednisone daily, mycophenolate. Continue stress dose steroids -hydrocortisone 50 3 times daily X 6 doses, and consider taper Hold home immunoppressants mycophenolate for now considering severity of infection Continue home oxycodone      Code Status: Full  Family Communication: None at bedside  Disposition Plan: PT rec SNF, pt refusing for now   Consultants:  None  Procedures:  None   Antimicrobials:  IV Ceftriaxone  DVT prophylaxis:  SCDs   Objective: Vitals:   07/26/17 1257 07/26/17 2051 07/27/17 0553 07/27/17 1256  BP: (!) 143/80 (!) 160/84 (!) 142/78 137/79  Pulse: 85 90 92 (!) 102  Resp: 20 18 19 16   Temp: 99 F (37.2 C) 98.8 F (37.1 C) 98.6 F (37 C) 98.8 F (37.1 C)  TempSrc:  Oral  Oral Oral  SpO2: 100% 97% 97% 100%  Weight:      Height:        Intake/Output Summary (Last 24 hours) at 07/27/2017 1943 Last data filed at 07/27/2017 1700 Gross per 24 hour    Intake 480 ml  Output 900 ml  Net -420 ml   Filed Weights   07/23/17 1236  Weight: 74.8 kg (165 lb)    Exam:   General:  NAD  Cardiovascular: S1, S2 present   Respiratory: CTAB   Abdomen: Soft, ND, NT, BS present   Musculoskeletal: No pedal edema bilaterally  Skin: Diffuse hyperpigmented lesions (likely from lupus)  Psychiatry: Normal mood    Data Reviewed: CBC: Recent Labs  Lab 07/23/17 1301 07/24/17 0450 07/25/17 0450 07/26/17 0430 07/27/17 0359  WBC 13.4* 18.1* 19.3* 16.8* 8.2  NEUTROABS 10.6*  --  17.3* 13.9* 6.1  HGB 10.7* 9.5* 8.3* 9.0* 8.8*  HCT 33.2* 30.5* 26.1* 28.3* 28.7*  MCV 69.7* 73.1* 69.8* 70.9* 71.4*  PLT 119* 82* 74* 100* 846*   Basic Metabolic Panel: Recent Labs  Lab 07/23/17 1301 07/23/17 1652 07/23/17 2244 07/24/17 0450 07/25/17 0450 07/26/17 0430 07/27/17 0359  NA 139  --   --  141 143 146* 147*  K 3.8  --   --  4.3 3.9 3.8 3.5  CL 108  --   --  117* 119* 121* 123*  CO2 18*  --   --  18* 17* 18* 16*  GLUCOSE 110*  --   --  154* 106* 87 67  BUN 49*  --   --  46* 41* 33* 28*  CREATININE 3.92*  --   --  3.27* 2.62* 2.24* 1.87*  CALCIUM 8.1*  --   --  7.4* 7.5* 8.0* 7.8*  MG  --  1.1* 2.2  --   --   --   --    GFR: Estimated Creatinine Clearance: 29.6 mL/min (A) (by C-G formula based on SCr of 1.87 mg/dL (H)). Liver Function Tests: Recent Labs  Lab 07/23/17 1301  AST 66*  ALT 43  ALKPHOS 111  BILITOT 0.7  PROT 6.3*  ALBUMIN 3.2*   No results for input(s): LIPASE, AMYLASE in the last 168 hours. No results for input(s): AMMONIA in the last 168 hours. Coagulation Profile: Recent Labs  Lab 07/23/17 1301  INR 1.03   Cardiac Enzymes: Recent Labs  Lab 07/23/17 1308 07/23/17 1652 07/23/17 2244 07/24/17 0450  TROPONINI 0.03* 0.04* 0.05* 0.04*   BNP (last 3 results) No results for input(s): PROBNP in the last 8760 hours. HbA1C: No results for input(s): HGBA1C in the last 72 hours. CBG: No results for input(s):  GLUCAP in the last 168 hours. Lipid Profile: No results for input(s): CHOL, HDL, LDLCALC, TRIG, CHOLHDL, LDLDIRECT in the last 72 hours. Thyroid Function Tests: No results for input(s): TSH, T4TOTAL, FREET4, T3FREE, THYROIDAB in the last 72 hours. Anemia Panel: Recent Labs    07/25/17 0450  FERRITIN 352*  TIBC 159*  IRON 28   Urine analysis:    Component Value Date/Time   COLORURINE YELLOW 07/23/2017 1243   APPEARANCEUR CLOUDY (A) 07/23/2017 1243   LABSPEC 1.025 07/23/2017 1243   PHURINE 6.0 07/23/2017 1243   GLUCOSEU NEGATIVE 07/23/2017 1243   HGBUR LARGE (A) 07/23/2017 1243   BILIRUBINUR NEGATIVE 07/23/2017 1243   KETONESUR 15 (A) 07/23/2017 1243   PROTEINUR 30 (A) 07/23/2017 1243   NITRITE NEGATIVE 07/23/2017 1243   LEUKOCYTESUR MODERATE (A) 07/23/2017 1243  Sepsis Labs: @LABRCNTIP (procalcitonin:4,lacticidven:4)  ) Recent Results (from the past 240 hour(s))  Culture, blood (Routine x 2)     Status: Abnormal   Collection Time: 07/23/17  1:00 PM  Result Value Ref Range Status   Specimen Description   Final    BLOOD LEFT ANTECUBITAL Performed at [facility], [address] [place], [place] [address]    Special Requests   Final    BOTTLES DRAWN AEROBIC AND ANAEROBIC Blood Culture adequate volume Performed at [facility], [address] [place], [place] [address]    Culture  Setup Time   Final    IN BOTH AEROBIC AND ANAEROBIC BOTTLES GRAM NEGATIVE RODS CRITICAL RESULT CALLED TO, READ BACK BY AND VERIFIED WITH: B GREEN PHARMD 07/24/17 0549 JDW Performed at [facility], [place] [address], [place], [place] [address]    Culture KLEBSIELLA PNEUMONIAE (A)  Final   Report Status 07/26/2017 FINAL  Final   Organism ID, Bacteria KLEBSIELLA PNEUMONIAE  Final      Susceptibility   Klebsiella pneumoniae - MIC*    AMPICILLIN >=32 RESISTANT Resistant     CEFAZOLIN <=4 SENSITIVE Sensitive     CEFEPIME <=1 SENSITIVE Sensitive     CEFTAZIDIME <=1  SENSITIVE Sensitive     CEFTRIAXONE <=1 SENSITIVE Sensitive     CIPROFLOXACIN <=0.25 SENSITIVE Sensitive     GENTAMICIN <=1 SENSITIVE Sensitive     IMIPENEM <=0.25 SENSITIVE Sensitive     TRIMETH/SULFA <=20 SENSITIVE Sensitive     AMPICILLIN/SULBACTAM 8 SENSITIVE Sensitive     PIP/TAZO <=4 SENSITIVE Sensitive     Extended ESBL NEGATIVE Sensitive     * KLEBSIELLA PNEUMONIAE  Blood Culture ID Panel (Reflexed)     Status: Abnormal   Collection Time: 07/23/17  1:00 PM  Result Value Ref Range Status   Enterococcus species NOT DETECTED NOT DETECTED Final   Listeria monocytogenes NOT DETECTED NOT DETECTED Final   Staphylococcus species NOT DETECTED NOT DETECTED Final   Staphylococcus aureus NOT DETECTED NOT DETECTED Final   Streptococcus species NOT DETECTED NOT DETECTED Final   Streptococcus agalactiae NOT DETECTED NOT DETECTED Final   Streptococcus pneumoniae NOT DETECTED NOT DETECTED Final   Streptococcus pyogenes NOT DETECTED NOT DETECTED Final   Acinetobacter baumannii NOT DETECTED NOT DETECTED Final   Enterobacteriaceae species DETECTED (A) NOT DETECTED Final    Comment: Enterobacteriaceae represent a large family of gram-negative bacteria, not a single organism. CRITICAL RESULT CALLED TO, READ BACK BY AND VERIFIED WITH: B GREEN PHARMD 07/24/17 0549 JDW    Enterobacter cloacae complex NOT DETECTED NOT DETECTED Final   Escherichia coli NOT DETECTED NOT DETECTED Final   Klebsiella oxytoca NOT DETECTED NOT DETECTED Final   Klebsiella pneumoniae DETECTED (A) NOT DETECTED Final    Comment: CRITICAL RESULT CALLED TO, READ BACK BY AND VERIFIED WITH: B GREEN PHARMD 07/24/17 0549 JDW    Proteus species NOT DETECTED NOT DETECTED Final   Serratia marcescens NOT DETECTED NOT DETECTED Final   Carbapenem resistance NOT DETECTED NOT DETECTED Final   Haemophilus influenzae NOT DETECTED NOT DETECTED Final   Neisseria meningitidis NOT DETECTED NOT DETECTED Final   Pseudomonas aeruginosa NOT  DETECTED NOT DETECTED Final   Candida albicans NOT DETECTED NOT DETECTED Final   Candida glabrata NOT DETECTED NOT DETECTED Final   Candida krusei NOT DETECTED NOT DETECTED Final   Candida parapsilosis NOT DETECTED NOT DETECTED Final   Candida tropicalis NOT DETECTED NOT DETECTED Final  Comment: Performed at [facility], [place] [address], [place], [place] [address]  MRSA PCR Screening     Status: None   Collection Time: 07/23/17 11:03 PM  Result Value Ref Range Status   MRSA by PCR NEGATIVE NEGATIVE Final    Comment:        The GeneXpert MRSA Assay (FDA approved for NASAL specimens only), is one component of a comprehensive MRSA colonization surveillance program. It is not intended to diagnose MRSA infection nor to guide or monitor treatment for MRSA infections. Performed at [facility], [place] [address], [place], [place] [address]   Urine Culture     Status: None   Collection Time: 07/24/17  9:17 AM  Result Value Ref Range Status   Specimen Description   Final    URINE, CLEAN CATCH Performed at [facility], [place] [address], [place], [place] [address]    Special Requests   Final    NONE Performed at [facility], [place] [address], [place], [place] [address]    Culture   Final    NO GROWTH Performed at [facility], [place] [address], [place], [place] [address]    Report Status 07/25/2017 FINAL  Final  Culture, blood (routine x 2)     Status: None (Preliminary result)   Collection Time: 07/25/17  4:50 AM  Result Value Ref Range Status   Specimen Description   Final    BLOOD LEFT WRIST Performed at [place] [address] [place], [place] [address]    Special Requests   Final    BOTTLES DRAWN AEROBIC ONLY Blood Culture results may not be optimal due to an inadequate volume of blood received in culture bottles Performed at [place] [address]  [place], [place] [address]    Culture   Final    NO GROWTH 2 DAYS Performed at [place] [address], [place], [place] [address]    Report Status PENDING  Incomplete  Culture, blood (routine x 2)     Status: None (Preliminary result)   Collection Time: 07/25/17  4:50 AM  Result Value Ref Range Status   Specimen Description BLOOD LEFT HAND  Final   Special Requests   Final    BOTTLES DRAWN AEROBIC ONLY Blood Culture results may not be optimal due to an inadequate volume of blood received in culture bottles Performed at [facility], [place] [address], [place], [place] [address]    Culture   Final    NO GROWTH 2 DAYS Performed at [place] [address], [place], [place] [address]    Report Status PENDING  Incomplete      Studies: No results found.  Scheduled Meds: . amLODipine  5 mg Oral Daily  . colchicine  0.6 mg Oral TID   Followed by  . [START ON 07/28/2017] colchicine  0.3 mg Oral Daily  . famotidine  20 mg Oral QHS  . predniSONE  40 mg Oral Q breakfast  . pregabalin  150 mg Oral BID  . traZODone  50 mg Oral QHS    Continuous Infusions: . cefTRIAXone (ROCEPHIN)  IV Stopped (07/27/17 1428)     LOS: 4 days     Alma Friendly, MD [facility]  If 7PM-7AM, please contact night-coverage [email or web address] Password [facility] 07/27/2017, 7:43 PM

## 2017-07-27 NOTE — Progress Notes (Signed)
OT Cancellation Note  Patient Details Name: Carolyn Zamora MRN: [id number] DOB: 07/09/1955   Cancelled Treatment:    Reason Eval/Treat Not Completed: Pain limiting ability to participate  Pt declined due to pain. Will check on pt next day.  Kari Baars, [place]  Payton Mccallum D 07/27/2017, 3:41 PM

## 2017-07-28 LAB — CBC WITH DIFFERENTIAL/PLATELET
Basophils Absolute: 0 10*3/uL (ref 0.0–0.1)
Basophils Relative: 0 %
EOS PCT: 0 %
Eosinophils Absolute: 0 10*3/uL (ref 0.0–0.7)
HCT: 32.8 % — ABNORMAL LOW (ref 36.0–46.0)
Hemoglobin: 10 g/dL — ABNORMAL LOW (ref 12.0–15.0)
LYMPHS ABS: 1.7 10*3/uL (ref 0.7–4.0)
LYMPHS PCT: 12 %
MCH: 21.8 pg — AB (ref 26.0–34.0)
MCHC: 30.5 g/dL (ref 30.0–36.0)
MCV: 71.6 fL — AB (ref 78.0–100.0)
MONO ABS: 0.6 10*3/uL (ref 0.1–1.0)
Monocytes Relative: 4 %
Neutro Abs: 12.5 10*3/uL — ABNORMAL HIGH (ref 1.7–7.7)
Neutrophils Relative %: 84 %
PLATELETS: 205 10*3/uL (ref 150–400)
RBC: 4.58 MIL/uL (ref 3.87–5.11)
RDW: 18 % — AB (ref 11.5–15.5)
WBC: 14.8 10*3/uL — ABNORMAL HIGH (ref 4.0–10.5)

## 2017-07-28 LAB — BASIC METABOLIC PANEL
Anion gap: 8 (ref 5–15)
BUN: 24 mg/dL — AB (ref 6–20)
CALCIUM: 8 mg/dL — AB (ref 8.9–10.3)
CO2: 17 mmol/L — ABNORMAL LOW (ref 22–32)
Chloride: 121 mmol/L — ABNORMAL HIGH (ref 101–111)
Creatinine, Ser: 1.76 mg/dL — ABNORMAL HIGH (ref 0.44–1.00)
GFR calc Af Amer: 35 mL/min — ABNORMAL LOW (ref >60)
GFR, EST NON AFRICAN AMERICAN: 30 mL/min — AB (ref >60)
GLUCOSE: 100 mg/dL — AB (ref 65–99)
Potassium: 6.6 mmol/L (ref 3.5–5.1)
Sodium: 146 mmol/L — ABNORMAL HIGH (ref 135–145)

## 2017-07-28 LAB — POTASSIUM: Potassium: 5.4 mmol/L — ABNORMAL HIGH (ref 3.5–5.1)

## 2017-07-28 LAB — URIC ACID: Uric Acid, Serum: 8.9 mg/dL — ABNORMAL HIGH (ref 2.3–6.6)

## 2017-07-28 MED ORDER — SODIUM POLYSTYRENE SULFONATE 15 GM/60ML PO SUSP
15.0000 g | Freq: Once | ORAL | Status: AC
  Administered 2017-07-28: 15 g via ORAL
  Filled 2017-07-28: qty 60

## 2017-07-28 MED ORDER — HEPARIN SODIUM (PORCINE) 5000 UNIT/ML IJ SOLN
5000.0000 [IU] | Freq: Three times a day (TID) | INTRAMUSCULAR | Status: DC
  Administered 2017-07-28 – 2017-07-29 (×2): 5000 [IU] via SUBCUTANEOUS
  Filled 2017-07-28 (×2): qty 1

## 2017-07-28 MED ORDER — OXYCODONE-ACETAMINOPHEN 5-325 MG PO TABS
1.0000 | ORAL_TABLET | Freq: Once | ORAL | Status: AC
  Administered 2017-07-28: 1 via ORAL
  Filled 2017-07-28: qty 1

## 2017-07-28 NOTE — Care Management Important Message (Signed)
Important Message  Patient Details  Name: Karyss Frese MRN: [id number] Date of Birth: 1955/03/16   Medicare Important Message Given:  Yes    Kerin Salen 07/28/2017, 11:04 AMImportant Message  Patient Details  Name: Deionna Marcantonio MRN: [id number] Date of Birth: 1955/04/30   Medicare Important Message Given:  Yes    Kerin Salen 07/28/2017, 11:04 AM

## 2017-07-28 NOTE — Progress Notes (Signed)
Potassium blood lab hemolyzed, reordered blood draw.

## 2017-07-28 NOTE — Progress Notes (Signed)
Occupational Therapy Treatment Patient Details Name: Carolyn Zamora MRN: [id number] DOB: [birth year] Today's Date: 07/28/2017    History of present illness Carolyn Zamora is a 62 y.o. female with medical history significant for lupus, HTN, CKD IV and kidney cancer s/p left nephrectomy, splenectomy admitted 6/13 19 with urosepsis   OT comments  Pt refused to stand due to pain- RN aware  Follow Up Recommendations  SNF          Precautions / Restrictions Precautions Precautions: Fall Restrictions Weight Bearing Restrictions: No       Mobility Bed Mobility Overal bed mobility: Needs Assistance Bed Mobility: Supine to Sit;Sit to Supine     Supine to sit: Min assist Sit to supine: Min assist   General bed mobility comments: light assist to sit upright and for legs onto bed  Transfers                 General transfer comment: refused to stand        ADL either performed or assessed with clinical judgement   ADL Overall ADL's : Needs assistance/impaired Eating/Feeding: Independent;Sitting   Grooming: Set up;Supervision/safety;Sitting                                 General ADL Comments: Pt sat EOB but refused to stand - even with walker.  Spoke with RN who is aware.  Encouraged pt to try walker and pt refused               Cognition Arousal/Alertness: Awake/alert Behavior During Therapy: WFL for tasks assessed/performed Overall Cognitive Status: Within Functional Limits for tasks assessed                                                     Pertinent Vitals/ Pain       Pain Score: 10-Worst pain ever Pain Location: L toe Pain Descriptors / Indicators: Discomfort Pain Intervention(s): Limited activity within patient's tolerance         End of Session   Pt left in bed with call bell and bed alarm on     Activity Tolerance Patient limited by pain   Patient Left     Nurse Communication Mobility status        Time: 4982-6415 OT Time Calculation (min): 15 min  Charges: OT General Charges $OT Visit: 1 Visit OT Treatments $Self Care/Home Management : 8-22 mins  [place], [place]   Payton Mccallum D 07/28/2017, 2:12 PM

## 2017-07-28 NOTE — Progress Notes (Signed)
PROGRESS NOTE  Carolyn Zamora XQJ:[id number] DOB: May 04, 1955 DOA: 07/23/2017 PCP: Beckie Salts, MD  HPI/Recap of past 24 hours: Carolyn Zamora is a 62 y.o. female with medical history significant for lupus, HTN, CKD IV and kidney cancer s/p left nephrectomy, splenectomy.  Symptoms started 3 days ago with abdominal pain and several episodes of vomiting.  Also with dysuria and urgency of 3 days duration. Patient reports fever and chills at home, associated poor p.o. Intake. Patient reports chest pain, constant , described as burning at the center of her chest radiating down to her abdomen, the started PTA. In the ED, pt was febrile up to 103.4, tachycardic to 144, WBC 13.4, creatinine about baseline 3.9, elevated BUN 49, bicarb 18, magnesium low 1.1, UA moderate leukocytes many bacteria, EKG mostly unchanged from prior but with prolonged QTC up to 644 on repeat.  Lactic acid 2.04>> 1.25. Patient was given 1.5 L normal saline bolus, IV vancomycin and cefepime. Pt admitted for further management.  Today, pt reported ongoing severe pain on L great toe. Denies any chest pain, abdominal pain, SOB, N/V/D. No fever noted   Assessment/Plan: Principal Problem:   Sepsis ([place]) Active Problems:   Hypertension   Lupus ([place])   CKD (chronic kidney disease), stage IV (HCC)  Sepsis with klebsiella pneumoniae bacteremia Improving Likely urinary source On presentation, fever 103.4, tachycardic, leukocytosis Currently afebrile, with leukocytosis (on steroid) LA 2.04-->1.25 BC X 2 growing klebsiella, repeat NGTD UA suggestive with mod leukocytes, many bacteria, >50 WBC UC with no growth (collected after antibiotics was started) S/P IVF Change to IV Ceftriaxone Immunocompromised on chronic low-dose prednisone and mycophenolate, will hold Completed stress dose steroids -hydrocortisone 50 3 times daily X 6 doses  UTI UA shows mod leuk, many bacteria, >50 WBC UC with no growth (collected after  antibiotics was started)  Acute gout flare Severe pain on the L great toe joint Continued pred, colchicine Uric acid 8.9  Acute Metabolic encephalopathy Resolved Likely secondary to sepsis   CKD stage 4, mild hyperkalemia Improving Creatinine at baseline ~?? 3.69 S/P kayexalate 1 dose Daily BMP  HTN Controlled Decrease home Norvasc to 5 mg dialy  Mild troponin leak Chest pain free Flat trend- 0.03>0.04>0.05>0.04 EKG Long QTC otherwise no significant change from prior Monitor on tele  Prolonged QTC Chronically elevated, 2017 EKG QTC 528.  Repeated EKGs in ED QTC up to 644 Not on QTC prolonging medications Replete electrolytes prn Monitor on telemetry  Hypomagnesemia Replace prn  Microcytic anemia of CKD/chronic disease Also ??dilutional Anemia panel, iron 28, TIBC 159, sats 18, ferritin 352 Daily CBC Hold DVT prophylaxis for now  Thrombocytopenia Monitor closely  Lupus Home medications Plaquenil, chronic steroids 10 mg prednisone daily, mycophenolate. S/P stress dose steroids -hydrocortisone 50 3 times daily X 6 doses Hold home immunoppressants mycophenolate for now considering severity of infection Continue home oxycodone      Code Status: Full  Family Communication: None at bedside  Disposition Plan: PT rec SNF, pt refusing for now   Consultants:  None  Procedures:  None   Antimicrobials:  IV Ceftriaxone  DVT prophylaxis:  SCDs, heparin   Objective: Vitals:   07/27/17 1256 07/27/17 2115 07/28/17 0425 07/28/17 1530  BP: 137/79 (!) 149/87 139/83 (!) 143/77  Pulse: (!) 102 88 92 90  Resp: 16 18 16 14   Temp: 98.8 F (37.1 C) 99.7 F (37.6 C) 98 F (36.7 C) 98.6 F (37 C)  TempSrc: Oral Oral Oral Oral  SpO2: 100% 100% 96%  100%  Weight:      Height:        Intake/Output Summary (Last 24 hours) at 07/28/2017 1947 Last data filed at 07/28/2017 1341 Gross per 24 hour  Intake 340 ml  Output 1300 ml  Net -960 ml   Filed  Weights   07/23/17 1236  Weight: 74.8 kg (165 lb)    Exam:   General:  NAD  Cardiovascular: S1, S2 present   Respiratory: CTAB   Abdomen: Soft, ND, NT, BS present   Musculoskeletal: No pedal edema bilaterally  Skin: Diffuse hyperpigmented lesions (likely from lupus)  Psychiatry: Normal mood    Data Reviewed: CBC: Recent Labs  Lab 07/23/17 1301 07/24/17 0450 07/25/17 0450 07/26/17 0430 07/27/17 0359 07/28/17 0454  WBC 13.4* 18.1* 19.3* 16.8* 8.2 14.8*  NEUTROABS 10.6*  --  17.3* 13.9* 6.1 12.5*  HGB 10.7* 9.5* 8.3* 9.0* 8.8* 10.0*  HCT 33.2* 30.5* 26.1* 28.3* 28.7* 32.8*  MCV 69.7* 73.1* 69.8* 70.9* 71.4* 71.6*  PLT 119* 82* 74* 100* 104* 408   Basic Metabolic Panel: Recent Labs  Lab 07/23/17 1652 07/23/17 2244 07/24/17 0450 07/25/17 0450 07/26/17 0430 07/27/17 0359 07/28/17 0454 07/28/17 0719  NA  --   --  141 143 146* 147* 146*  --   K  --   --  4.3 3.9 3.8 3.5 6.6* 5.4*  CL  --   --  117* 119* 121* 123* 121*  --   CO2  --   --  18* 17* 18* 16* 17*  --   GLUCOSE  --   --  154* 106* 87 67 100*  --   BUN  --   --  46* 41* 33* 28* 24*  --   CREATININE  --   --  3.27* 2.62* 2.24* 1.87* 1.76*  --   CALCIUM  --   --  7.4* 7.5* 8.0* 7.8* 8.0*  --   MG 1.1* 2.2  --   --   --   --   --   --    GFR: Estimated Creatinine Clearance: 31.4 mL/min (A) (by C-G formula based on SCr of 1.76 mg/dL (H)). Liver Function Tests: Recent Labs  Lab 07/23/17 1301  AST 66*  ALT 43  ALKPHOS 111  BILITOT 0.7  PROT 6.3*  ALBUMIN 3.2*   No results for input(s): LIPASE, AMYLASE in the last 168 hours. No results for input(s): AMMONIA in the last 168 hours. Coagulation Profile: Recent Labs  Lab 07/23/17 1301  INR 1.03   Cardiac Enzymes: Recent Labs  Lab 07/23/17 1308 07/23/17 1652 07/23/17 2244 07/24/17 0450  TROPONINI 0.03* 0.04* 0.05* 0.04*   BNP (last 3 results) No results for input(s): PROBNP in the last 8760 hours. HbA1C: No results for input(s):  HGBA1C in the last 72 hours. CBG: No results for input(s): GLUCAP in the last 168 hours. Lipid Profile: No results for input(s): CHOL, HDL, LDLCALC, TRIG, CHOLHDL, LDLDIRECT in the last 72 hours. Thyroid Function Tests: No results for input(s): TSH, T4TOTAL, FREET4, T3FREE, THYROIDAB in the last 72 hours. Anemia Panel: No results for input(s): VITAMINB12, FOLATE, FERRITIN, TIBC, IRON, RETICCTPCT in the last 72 hours. Urine analysis:    Component Value Date/Time   COLORURINE YELLOW 07/23/2017 1243   APPEARANCEUR CLOUDY (A) 07/23/2017 1243   LABSPEC 1.025 07/23/2017 1243   PHURINE 6.0 07/23/2017 1243   GLUCOSEU NEGATIVE 07/23/2017 1243   HGBUR LARGE (A) 07/23/2017 1243   BILIRUBINUR NEGATIVE 07/23/2017 1243   KETONESUR 15 (A)  07/23/2017 1243   PROTEINUR 30 (A) 07/23/2017 1243   NITRITE NEGATIVE 07/23/2017 1243   LEUKOCYTESUR MODERATE (A) 07/23/2017 1243   Sepsis Labs: @LABRCNTIP (procalcitonin:4,lacticidven:4)  ) Recent Results (from the past 240 hour(s))  Culture, blood (Routine x 2)     Status: Abnormal   Collection Time: 07/23/17  1:00 PM  Result Value Ref Range Status   Specimen Description   Final    BLOOD LEFT ANTECUBITAL Performed at [facility], [address] [place], [place] [address]    Special Requests   Final    BOTTLES DRAWN AEROBIC AND ANAEROBIC Blood Culture adequate volume Performed at [facility], [address] [place], [place] [address]    Culture  Setup Time   Final    IN BOTH AEROBIC AND ANAEROBIC BOTTLES GRAM NEGATIVE RODS CRITICAL RESULT CALLED TO, READ BACK BY AND VERIFIED WITH: B GREEN PHARMD 07/24/17 0549 JDW Performed at [facility], [place] [address], [place], [place] [address]    Culture KLEBSIELLA PNEUMONIAE (A)  Final   Report Status 07/26/2017 FINAL  Final   Organism ID, Bacteria KLEBSIELLA PNEUMONIAE  Final      Susceptibility   Klebsiella pneumoniae - MIC*    AMPICILLIN >=32 RESISTANT Resistant      CEFAZOLIN <=4 SENSITIVE Sensitive     CEFEPIME <=1 SENSITIVE Sensitive     CEFTAZIDIME <=1 SENSITIVE Sensitive     CEFTRIAXONE <=1 SENSITIVE Sensitive     CIPROFLOXACIN <=0.25 SENSITIVE Sensitive     GENTAMICIN <=1 SENSITIVE Sensitive     IMIPENEM <=0.25 SENSITIVE Sensitive     TRIMETH/SULFA <=20 SENSITIVE Sensitive     AMPICILLIN/SULBACTAM 8 SENSITIVE Sensitive     PIP/TAZO <=4 SENSITIVE Sensitive     Extended ESBL NEGATIVE Sensitive     * KLEBSIELLA PNEUMONIAE  Blood Culture ID Panel (Reflexed)     Status: Abnormal   Collection Time: 07/23/17  1:00 PM  Result Value Ref Range Status   Enterococcus species NOT DETECTED NOT DETECTED Final   Listeria monocytogenes NOT DETECTED NOT DETECTED Final   Staphylococcus species NOT DETECTED NOT DETECTED Final   Staphylococcus aureus NOT DETECTED NOT DETECTED Final   Streptococcus species NOT DETECTED NOT DETECTED Final   Streptococcus agalactiae NOT DETECTED NOT DETECTED Final   Streptococcus pneumoniae NOT DETECTED NOT DETECTED Final   Streptococcus pyogenes NOT DETECTED NOT DETECTED Final   Acinetobacter baumannii NOT DETECTED NOT DETECTED Final   Enterobacteriaceae species DETECTED (A) NOT DETECTED Final    Comment: Enterobacteriaceae represent a large family of gram-negative bacteria, not a single organism. CRITICAL RESULT CALLED TO, READ BACK BY AND VERIFIED WITH: B GREEN PHARMD 07/24/17 0549 JDW    Enterobacter cloacae complex NOT DETECTED NOT DETECTED Final   Escherichia coli NOT DETECTED NOT DETECTED Final   Klebsiella oxytoca NOT DETECTED NOT DETECTED Final   Klebsiella pneumoniae DETECTED (A) NOT DETECTED Final    Comment: CRITICAL RESULT CALLED TO, READ BACK BY AND VERIFIED WITH: B GREEN PHARMD 07/24/17 0549 JDW    Proteus species NOT DETECTED NOT DETECTED Final   Serratia marcescens NOT DETECTED NOT DETECTED Final   Carbapenem resistance NOT DETECTED NOT DETECTED Final   Haemophilus influenzae NOT DETECTED NOT DETECTED Final    Neisseria meningitidis NOT DETECTED NOT DETECTED Final   Pseudomonas aeruginosa NOT DETECTED NOT DETECTED Final   Candida albicans NOT DETECTED NOT DETECTED Final   Candida glabrata NOT DETECTED NOT DETECTED Final   Candida krusei NOT  DETECTED NOT DETECTED Final   Candida parapsilosis NOT DETECTED NOT DETECTED Final   Candida tropicalis NOT DETECTED NOT DETECTED Final    Comment: Performed at [facility], [place] [address], [place], [place] [address]  MRSA PCR Screening     Status: None   Collection Time: 07/23/17 11:03 PM  Result Value Ref Range Status   MRSA by PCR NEGATIVE NEGATIVE Final    Comment:        The GeneXpert MRSA Assay (FDA approved for NASAL specimens only), is one component of a comprehensive MRSA colonization surveillance program. It is not intended to diagnose MRSA infection nor to guide or monitor treatment for MRSA infections. Performed at [facility], [place] [address] [place], [place] [address]   Urine Culture     Status: None   Collection Time: 07/24/17  9:17 AM  Result Value Ref Range Status   Specimen Description   Final    URINE, CLEAN CATCH Performed at [facility], [place] [address] [place], [place] [address]    Special Requests   Final    NONE Performed at [facility], [place] [address], [place], [place] [address]    Culture   Final    NO GROWTH Performed at [facility], [place] [address], [place], [place] [address]    Report Status 07/25/2017 FINAL  Final  Culture, blood (routine x 2)     Status: None (Preliminary result)   Collection Time: 07/25/17  4:50 AM  Result Value Ref Range Status   Specimen Description   Final    BLOOD LEFT WRIST Performed at [place] [address] [place], [place] [address]    Special Requests   Final    BOTTLES DRAWN AEROBIC ONLY Blood Culture results may not be optimal due to an inadequate volume of blood received in culture  bottles Performed at [place] [address] [place], [place] [address]    Culture   Final    NO GROWTH 3 DAYS Performed at [facility], [place] [address] [place], [place] [address]    Report Status PENDING  Incomplete  Culture, blood (routine x 2)     Status: None (Preliminary result)   Collection Time: 07/25/17  4:50 AM  Result Value Ref Range Status   Specimen Description BLOOD LEFT HAND  Final   Special Requests   Final    BOTTLES DRAWN AEROBIC ONLY Blood Culture results may not be optimal due to an inadequate volume of blood received in culture bottles Performed at [facility], [place] [address] [place],  [address]    Culture   Final    NO GROWTH 3 DAYS Performed at [facility], [place] [address] [place],  [address]    Report Status PENDING  Incomplete      Studies: No results found.  Scheduled Meds: . amLODipine  5 mg Oral Daily  . colchicine  0.3 mg Oral Daily  . famotidine  20 mg Oral QHS  . predniSONE  40 mg Oral Q breakfast  . pregabalin  150 mg Oral BID  . traZODone  50 mg Oral QHS    Continuous Infusions: . cefTRIAXone (ROCEPHIN)  IV Stopped (07/28/17 1411)     LOS: 5 days     Alma Friendly, MD [facility]  If 7PM-7AM, please contact night-coverage [email or web address] Password [facility] 07/28/2017, 7:47 PM

## 2017-07-28 NOTE — Care Management Note (Signed)
Case Management Note  Patient Details  Name: Carolyn Zamora MRN: [id number] Date of Birth: [birth year]  Subjective/Objective:  Pt admitted with Sepsis                  Action/Plan:  Pt declined SNF. Spoke with pt and daughter at bedside. Pt selected to go home with [facility]. [facility] was selected, referral given to in house rep.   Expected Discharge Date:                  Expected Discharge Plan:  [place]  In-House Referral:     Discharge planning Services  CM Consult  Post Acute Care Choice:    Choice offered to:  Adult Children  DME Arranged:    DME Agency:     HH Arranged:  RN, PT, NA HH Agency:  [place]  Status of Service:  Completed, signed off  If discussed at [place] of Stay Meetings, dates discussed:    Additional CommentsPurcell Mouton, RN 07/28/2017, 2:18 PM

## 2017-07-29 DIAGNOSIS — M329 Systemic lupus erythematosus, unspecified: Secondary | ICD-10-CM

## 2017-07-29 DIAGNOSIS — I10 Essential (primary) hypertension: Secondary | ICD-10-CM

## 2017-07-29 DIAGNOSIS — A419 Sepsis, unspecified organism: Secondary | ICD-10-CM

## 2017-07-29 LAB — CBC WITH DIFFERENTIAL/PLATELET
BASOS PCT: 0 %
Basophils Absolute: 0 10*3/uL (ref 0.0–0.1)
EOS ABS: 0 10*3/uL (ref 0.0–0.7)
EOS PCT: 0 %
HCT: 26.1 % — ABNORMAL LOW (ref 36.0–46.0)
HEMOGLOBIN: 8.3 g/dL — AB (ref 12.0–15.0)
Lymphocytes Relative: 11 %
Lymphs Abs: 1.7 10*3/uL (ref 0.7–4.0)
MCH: 22.2 pg — AB (ref 26.0–34.0)
MCHC: 31.8 g/dL (ref 30.0–36.0)
MCV: 69.8 fL — ABNORMAL LOW (ref 78.0–100.0)
MONO ABS: 0.8 10*3/uL (ref 0.1–1.0)
Monocytes Relative: 5 %
NEUTROS PCT: 84 %
Neutro Abs: 13 10*3/uL — ABNORMAL HIGH (ref 1.7–7.7)
Platelets: 188 10*3/uL (ref 150–400)
RBC: 3.74 MIL/uL — ABNORMAL LOW (ref 3.87–5.11)
RDW: 17.5 % — AB (ref 11.5–15.5)
WBC: 15.5 10*3/uL — ABNORMAL HIGH (ref 4.0–10.5)

## 2017-07-29 LAB — BASIC METABOLIC PANEL
Anion gap: 8 (ref 5–15)
BUN: 25 mg/dL — AB (ref 6–20)
CALCIUM: 7.5 mg/dL — AB (ref 8.9–10.3)
CHLORIDE: 119 mmol/L — AB (ref 101–111)
CO2: 19 mmol/L — AB (ref 22–32)
CREATININE: 1.74 mg/dL — AB (ref 0.44–1.00)
GFR calc Af Amer: 36 mL/min — ABNORMAL LOW (ref >60)
GFR calc non Af Amer: 31 mL/min — ABNORMAL LOW (ref >60)
Glucose, Bld: 91 mg/dL (ref 65–99)
Potassium: 4.1 mmol/L (ref 3.5–5.1)
SODIUM: 146 mmol/L — AB (ref 135–145)

## 2017-07-29 MED ORDER — CEPHALEXIN 500 MG PO CAPS: 500.0000 mg | ORAL_CAPSULE | Freq: Four times a day (QID) | ORAL | Status: DC

## 2017-07-29 MED ORDER — PREDNISONE 20 MG PO TABS: ORAL_TABLET | ORAL | 0 refills | Status: DC

## 2017-07-29 MED ORDER — CEPHALEXIN 500 MG PO CAPS: 500.0000 mg | ORAL_CAPSULE | Freq: Four times a day (QID) | ORAL | 0 refills | Status: DC

## 2017-07-29 NOTE — Progress Notes (Signed)
OT Cancellation Note  Patient Details Name: Carolyn Zamora MRN: [id number] DOB: [birth year]   Cancelled Treatment:    Reason Eval/Treat Not Completed: Other (comment). Pt has been getting up to [facility] with nursing. She did not want to get up with PT earlier due to gout pain. Pt is supposed to d/c home today with [place].  If pt remains here, I will check back another day.  Solace Wendorff 07/29/2017, 1:25 PM  Lesle Chris, OTR/L [phone] 07/29/2017

## 2017-07-29 NOTE — Progress Notes (Signed)
PT Cancellation Note  Patient Details Name: Carolyn Zamora MRN: [id number] DOB: 1955/08/17   Cancelled Treatment:    Reason Eval/Treat Not Completed: Patient declined, no reason specified;Other (comment); pt refused to get OOB; states she does not want PT to come back, she is in too much pain with her gout flare; she states she is going home and is agreeable to HHPT, she has all her DME and has family assist as needed; PT will sign off at this time   [facility] 07/29/2017, 12:22 PM

## 2017-07-29 NOTE — Discharge Summary (Signed)
Physician Discharge Summary  Carolyn Zamora LGX:[id number] DOB: 03/29/1955 DOA: 07/23/2017  PCP: Beckie Salts, MD  Admit date: 07/23/2017 Discharge date: 07/29/2017  Admitted From: Home  Disposition:  Home   Recommendations for Outpatient Follow-up and new medication changes:  1. Follow up with Dr. Oswaldo Milian in one week 2. Patient will complete antibiotic therapy with 4 days of cephalexin 3. Patient will take 3 days of 40 mg of prednisone for acute gout flare, avoid NSAIDs due to chronic kidney disease.  Home Health: yes   Equipment/Devices: no    Discharge Condition: stable CODE STATUS: full  Diet recommendation: heart healthy   Brief/Interim Summary: 62 year old female who presented with confusion, abdominal pain and dysuria.  She does have the significant past medical history for systemic lupus erythematous, hypertension, chronic kidney disease stage IV, renal cell carcinoma status post left nephrectomy, and status post splenectomy.  Patient reported 3 days of constant abdominal pain, associated with vomiting, dysuria and urinary urgency.  Fevers and poor appetite.  On initial physical examination temperature 103.4, heart rate 814, systolic blood pressure 481, oxygen saturation 94%.  Moist mucous membranes, lungs clear to auscultation bilaterally, heart S1-S2 present, tachycardic, abdomen was tender in the suprapubic region, no rebound or guarding, no lower extremity edema.  Sodium 139, potassium 3.8, chloride 108, bicarb 18, glucose 110, BUN 49, creatinine 3.92, white count 13.4, hemoglobin 10.7, hematocrit 33.2, platelets 119.  Urine analysis with specific gravity 1.025, protein 30, white cells greater than 50.  Chest radiograph was negative for infiltrates.  EKG with sinus tachycardia, normal axis, normal intervals, poor R wave progression.  Patient was admitted to the hospital with the working diagnosis of sepsis due to urinary tract infection, complicated by metabolic  encephalopathy.  1.  Sepsis due to urinary tract infection, Klebsiella pneumonia bacteremia (gram-negative bacteremia).  Complicated by metabolic encephalopathy.  Present on admission.  Patient was admitted to the medical ward, she was placed on a remote telemetry monitor, received antibiotic therapy with broad spectrum antibiotic with IV vancomycin and IV cefepime.  Intravenous fluids with isotonic saline and stress dose steroids.  Blood cultures turn positive for Klebsiella pneumonia which was resistant to ampicillin but sensitive to the rest of the antibiotics.  Antibiotic therapy was changed to IV ceftriaxone with good toleration.  Follow-up blood cultures were no growth x4 days, urine culture remain no growth.  Patient's mentation improved, no focality, stable to be discharged June 19.  Patient will finish course of antibiotics with cefazalexin  2.  Kidney injury on chronic kidney disease stage IV complicated by hyperkalemia.  Patient received isotonic saline intravenously, peak creatinine 3.92, at discharge is down to 1.74, potassium was corrected with discharge potassium 4.1, with serum bicarbonate 19.  Patient tolerating diet adequately.  Resume furosemide 40 mg daily at discharge.  3.  Systemic lupus erythematous.  Remained stable during her hospitalization, no signs of acute flare, continue immunosuppressive therapy including hydroxychloroquine, mycophenolate, and prednisone.  4.  Hypertension.  Antihypertensive regimen was resumed with amlodipine 10 mg daily.  5.  Acute gout flare.  Patient developed left foot metatarsal acute pain, erythema and local edema, consistent with acute gout flare.  Patient will take 3 more days of prednisone 40 mg daily then continue his home dose of 10 mg daily.  Avoid NSAIDs due to chronic kidney disease.  6.  Prolonged QT interval. EKG with corrected Qt down to 0,485 (manually calculated). Plan to follow up as outpatient.  Avoid QT prolonging agents or  electrolyte disturbances.  Discharge Diagnoses:  Principal Problem:   Sepsis ([place]) Active Problems:   Hypertension   Lupus ([place])   CKD (chronic kidney disease), stage IV ([place])    Discharge Instructions   Allergies as of 07/29/2017   No Known Allergies     Medication List    TAKE these medications   amLODipine 10 MG tablet Commonly known as:  NORVASC Take 10 mg by mouth daily.   cephALEXin 500 MG capsule Commonly known as:  KEFLEX Take 1 capsule (500 mg total) by mouth every 6 (six) hours for 4 days.   furosemide 40 MG tablet Commonly known as:  LASIX Take 40 mg by mouth daily.   HYDROcodone-acetaminophen 5-325 MG tablet Commonly known as:  NORCO/VICODIN Take 1 tablet by mouth every 8 (eight) hours as needed for pain.   hydroxychloroquine 200 MG tablet Commonly known as:  PLAQUENIL Take 200 mg by mouth daily.   hydrOXYzine 50 MG tablet Commonly known as:  ATARAX/VISTARIL Take 50 mg by mouth 3 (three) times daily as needed.   mycophenolate 500 MG tablet Commonly known as:  CELLCEPT Take 500 mg by mouth daily.   oxyCODONE-acetaminophen 5-325 MG tablet Commonly known as:  PERCOCET/ROXICET Take by mouth every 4 (four) hours as needed for severe pain.   predniSONE 10 MG tablet Commonly known as:  DELTASONE Take 10 mg by mouth daily with breakfast. What changed:  Another medication with the same name was added. Make sure you understand how and when to take each.   predniSONE 20 MG tablet Commonly known as:  DELTASONE Take two tablets daily for 3 days, then resume 10 mg tablet daily. What changed:  You were already taking a medication with the same name, and this prescription was added. Make sure you understand how and when to take each.   pregabalin 150 MG capsule Commonly known as:  LYRICA Take 150 mg by mouth 2 (two) times daily.   traZODone 50 MG tablet Commonly known as:  DESYREL Take 50 mg by mouth daily.   Vitamin D (Ergocalciferol) 50000 units  Caps capsule Commonly known as:  DRISDOL Take 1 capsule by mouth once a week.      Follow-up Information    [facility] Follow up.   Specialty:  Home Health Services Why:  Alvis Lemmings will call you 24 to 48 hours after discharge if there is no call please call [phone] Thank you. Contact information: [place] [address] [phone]          No Known Allergies  Consultations:     Procedures/Studies: Dg Chest 2 View  Result Date: 07/23/2017 CLINICAL DATA:  Fevers and vomiting EXAM: CHEST - 2 VIEW COMPARISON:  02/22/2016 FINDINGS: The heart size and mediastinal contours are within normal limits. Both lungs are clear. The visualized skeletal structures are unremarkable. IMPRESSION: No active cardiopulmonary disease. Electronically Signed   By: Inez Catalina M.D.   On: 07/23/2017 13:57       Subjective: Patient is feeling better, left foot pain has been improving, no nausea or vomiting. Feeling comfortable in being discharge home.   Discharge Exam: Vitals:   07/28/17 2258 07/29/17 0524  BP: 136/79 136/84  Pulse: 82 86  Resp: 16 20  Temp: 99.3 F (37.4 C) 98.1 F (36.7 C)  SpO2: 100% 100%   Vitals:   07/28/17 0425 07/28/17 1530 07/28/17 2258 07/29/17 0524  BP: 139/83 (!) 143/77 136/79 136/84  Pulse: 92 90 82 86  Resp: 16  14 16 20   Temp: 98 F (36.7 C) 98.6 F (37 C) 99.3 F (37.4 C) 98.1 F (36.7 C)  TempSrc: Oral Oral Oral Oral  SpO2: 96% 100% 100% 100%  Weight:      Height:        General: Not in pain or dyspnea Neurology: Awake and alert, non focal  E ENT: no pallor, no icterus, oral mucosa moist Cardiovascular: No JVD. S1-S2 present, rhythmic, no gallops, rubs, or murmurs. No lower extremity edema. Pulmonary: vesicular breath sounds bilaterally, adequate air movement, no wheezing, rhonchi or rales. Gastrointestinal. Abdomen flat, no organomegaly, non tender, no rebound or guarding Skin. No  rashes Musculoskeletal: no joint deformities/ left foot with first metatarsal edema and erythema, tender to palpation.    The results of significant diagnostics from this hospitalization (including imaging, microbiology, ancillary and laboratory) are listed below for reference.     Microbiology: Recent Results (from the past 240 hour(s))  Culture, blood (Routine x 2)     Status: Abnormal   Collection Time: 07/23/17  1:00 PM  Result Value Ref Range Status   Specimen Description   Final    BLOOD LEFT ANTECUBITAL Performed at [facility], [address] [place], [place] [address]    Special Requests   Final    BOTTLES DRAWN AEROBIC AND ANAEROBIC Blood Culture adequate volume Performed at [facility], [address] [place], [place] [address]    Culture  Setup Time   Final    IN BOTH AEROBIC AND ANAEROBIC BOTTLES GRAM NEGATIVE RODS CRITICAL RESULT CALLED TO, READ BACK BY AND VERIFIED WITH: B GREEN PHARMD 07/24/17 0549 JDW Performed at [facility], [address] [place], [place] [address]    Culture KLEBSIELLA PNEUMONIAE (A)  Final   Report Status 07/26/2017 FINAL  Final   Organism ID, Bacteria KLEBSIELLA PNEUMONIAE  Final      Susceptibility   Klebsiella pneumoniae - MIC*    AMPICILLIN >=32 RESISTANT Resistant     CEFAZOLIN <=4 SENSITIVE Sensitive     CEFEPIME <=1 SENSITIVE Sensitive     CEFTAZIDIME <=1 SENSITIVE Sensitive     CEFTRIAXONE <=1 SENSITIVE Sensitive     CIPROFLOXACIN <=0.25 SENSITIVE Sensitive     GENTAMICIN <=1 SENSITIVE Sensitive     IMIPENEM <=0.25 SENSITIVE Sensitive     TRIMETH/SULFA <=20 SENSITIVE Sensitive     AMPICILLIN/SULBACTAM 8 SENSITIVE Sensitive     PIP/TAZO <=4 SENSITIVE Sensitive     Extended ESBL NEGATIVE Sensitive     * KLEBSIELLA PNEUMONIAE  Blood Culture ID Panel (Reflexed)     Status: Abnormal   Collection Time: 07/23/17  1:00 PM  Result Value Ref Range Status   Enterococcus species NOT DETECTED NOT DETECTED  Final   Listeria monocytogenes NOT DETECTED NOT DETECTED Final   Staphylococcus species NOT DETECTED NOT DETECTED Final   Staphylococcus aureus NOT DETECTED NOT DETECTED Final   Streptococcus species NOT DETECTED NOT DETECTED Final   Streptococcus agalactiae NOT DETECTED NOT DETECTED Final   Streptococcus pneumoniae NOT DETECTED NOT DETECTED Final   Streptococcus pyogenes NOT DETECTED NOT DETECTED Final   Acinetobacter baumannii NOT DETECTED NOT DETECTED Final   Enterobacteriaceae species DETECTED (A) NOT DETECTED Final    Comment: Enterobacteriaceae represent a large family of gram-negative bacteria, not a single organism. CRITICAL RESULT CALLED TO, READ BACK BY AND VERIFIED WITH: B GREEN PHARMD 07/24/17 0549 JDW    Enterobacter cloacae complex NOT DETECTED NOT DETECTED Final  Escherichia coli NOT DETECTED NOT DETECTED Final   Klebsiella oxytoca NOT DETECTED NOT DETECTED Final   Klebsiella pneumoniae DETECTED (A) NOT DETECTED Final    Comment: CRITICAL RESULT CALLED TO, READ BACK BY AND VERIFIED WITH: B GREEN PHARMD 07/24/17 0549 JDW    Proteus species NOT DETECTED NOT DETECTED Final   Serratia marcescens NOT DETECTED NOT DETECTED Final   Carbapenem resistance NOT DETECTED NOT DETECTED Final   Haemophilus influenzae NOT DETECTED NOT DETECTED Final   Neisseria meningitidis NOT DETECTED NOT DETECTED Final   Pseudomonas aeruginosa NOT DETECTED NOT DETECTED Final   Candida albicans NOT DETECTED NOT DETECTED Final   Candida glabrata NOT DETECTED NOT DETECTED Final   Candida krusei NOT DETECTED NOT DETECTED Final   Candida parapsilosis NOT DETECTED NOT DETECTED Final   Candida tropicalis NOT DETECTED NOT DETECTED Final    Comment: Performed at [facility], [place] [address] [place], [place] [address]  MRSA PCR Screening     Status: None   Collection Time: 07/23/17 11:03 PM  Result Value Ref Range Status   MRSA by PCR NEGATIVE NEGATIVE Final    Comment:        The GeneXpert MRSA  Assay (FDA approved for NASAL specimens only), is one component of a comprehensive MRSA colonization surveillance program. It is not intended to diagnose MRSA infection nor to guide or monitor treatment for MRSA infections. Performed at [facility], [place] [address] [place], [place] [address]   Urine Culture     Status: None   Collection Time: 07/24/17  9:17 AM  Result Value Ref Range Status   Specimen Description   Final    URINE, CLEAN CATCH Performed at [facility], [place] [address], [place], [place] [address]    Special Requests   Final    NONE Performed at [facility], [place] [address], [place], [place] [address]    Culture   Final    NO GROWTH Performed at [facility], [place] [address], [place], [place] [address]    Report Status 07/25/2017 FINAL  Final  Culture, blood (routine x 2)     Status: None (Preliminary result)   Collection Time: 07/25/17  4:50 AM  Result Value Ref Range Status   Specimen Description BLOOD LEFT WRIST  Final   Special Requests   Final    BOTTLES DRAWN AEROBIC ONLY Blood Culture results may not be optimal due to an inadequate volume of blood received in culture bottles Performed at [facility], [place] [address], [place], [place] [address]    Culture NO GROWTH 4 DAYS  Final   Report Status PENDING  Incomplete  Culture, blood (routine x 2)     Status: None (Preliminary result)   Collection Time: 07/25/17  4:50 AM  Result Value Ref Range Status   Specimen Description BLOOD LEFT HAND  Final   Special Requests   Final    BOTTLES DRAWN AEROBIC ONLY Blood Culture results may not be optimal due to an inadequate volume of blood received in culture bottles Performed at [facility], [place] [address] [place], [place] [address]    Culture NO GROWTH 4 DAYS  Final   Report Status PENDING  Incomplete     Labs: BNP (last 3 results) No results for input(s):  BNP in the last 8760 hours. Basic Metabolic Panel: Recent Labs  Lab 07/23/17 1652 07/23/17 2244  07/25/17 0450 07/26/17 0430 07/27/17 5573  07/28/17 0454 07/28/17 0719 07/29/17 0442  NA  --   --    < > 143 146* 147* 146*  --  146*  K  --   --    < > 3.9 3.8 3.5 6.6* 5.4* 4.1  CL  --   --    < > 119* 121* 123* 121*  --  119*  CO2  --   --    < > 17* 18* 16* 17*  --  19*  GLUCOSE  --   --    < > 106* 87 67 100*  --  91  BUN  --   --    < > 41* 33* 28* 24*  --  25*  CREATININE  --   --    < > 2.62* 2.24* 1.87* 1.76*  --  1.74*  CALCIUM  --   --    < > 7.5* 8.0* 7.8* 8.0*  --  7.5*  MG 1.1* 2.2  --   --   --   --   --   --   --    < > = values in this interval not displayed.   Liver Function Tests: Recent Labs  Lab 07/23/17 1301  AST 66*  ALT 43  ALKPHOS 111  BILITOT 0.7  PROT 6.3*  ALBUMIN 3.2*   No results for input(s): LIPASE, AMYLASE in the last 168 hours. No results for input(s): AMMONIA in the last 168 hours. CBC: Recent Labs  Lab 07/25/17 0450 07/26/17 0430 07/27/17 0359 07/28/17 0454 07/29/17 0442  WBC 19.3* 16.8* 8.2 14.8* 15.5*  NEUTROABS 17.3* 13.9* 6.1 12.5* 13.0*  HGB 8.3* 9.0* 8.8* 10.0* 8.3*  HCT 26.1* 28.3* 28.7* 32.8* 26.1*  MCV 69.8* 70.9* 71.4* 71.6* 69.8*  PLT 74* 100* 104* 205 188   Cardiac Enzymes: Recent Labs  Lab 07/23/17 1308 07/23/17 1652 07/23/17 2244 07/24/17 0450  TROPONINI 0.03* 0.04* 0.05* 0.04*   BNP: Invalid input(s): POCBNP CBG: No results for input(s): GLUCAP in the last 168 hours. D-Dimer No results for input(s): DDIMER in the last 72 hours. Hgb A1c No results for input(s): HGBA1C in the last 72 hours. Lipid Profile No results for input(s): CHOL, HDL, LDLCALC, TRIG, CHOLHDL, LDLDIRECT in the last 72 hours. Thyroid function studies No results for input(s): TSH, T4TOTAL, T3FREE, THYROIDAB in the last 72 hours.  Invalid input(s): FREET3 Anemia work up No results for input(s): VITAMINB12, FOLATE, FERRITIN, TIBC,  IRON, RETICCTPCT in the last 72 hours. Urinalysis    Component Value Date/Time   COLORURINE YELLOW 07/23/2017 1243   APPEARANCEUR CLOUDY (A) 07/23/2017 1243   LABSPEC 1.025 07/23/2017 1243   PHURINE 6.0 07/23/2017 1243   GLUCOSEU NEGATIVE 07/23/2017 1243   HGBUR LARGE (A) 07/23/2017 1243   BILIRUBINUR NEGATIVE 07/23/2017 1243   KETONESUR 15 (A) 07/23/2017 1243   PROTEINUR 30 (A) 07/23/2017 1243   NITRITE NEGATIVE 07/23/2017 1243   LEUKOCYTESUR MODERATE (A) 07/23/2017 1243   Sepsis Labs Invalid input(s): PROCALCITONIN,  WBC,  LACTICIDVEN Microbiology Recent Results (from the past 240 hour(s))  Culture, blood (Routine x 2)     Status: Abnormal   Collection Time: 07/23/17  1:00 PM  Result Value Ref Range Status   Specimen Description   Final    BLOOD LEFT ANTECUBITAL Performed at [facility], [address] [place], [place] [address]    Special Requests   Final    BOTTLES DRAWN AEROBIC AND ANAEROBIC Blood Culture adequate volume Performed at [facility], [address] [place], [place] [address]    Culture  Setup Time   Final    IN BOTH AEROBIC AND ANAEROBIC BOTTLES GRAM NEGATIVE RODS CRITICAL RESULT CALLED TO, READ BACK BY AND VERIFIED WITH: B GREEN PHARMD 07/24/17 0549 JDW Performed at [facility], [address], [place], [place] [address]    Culture KLEBSIELLA PNEUMONIAE (A)  Final   Report Status 07/26/2017 FINAL  Final   Organism ID, Bacteria KLEBSIELLA PNEUMONIAE  Final      Susceptibility   Klebsiella pneumoniae - MIC*    AMPICILLIN >=32 RESISTANT Resistant     CEFAZOLIN <=4 SENSITIVE Sensitive     CEFEPIME <=1 SENSITIVE Sensitive     CEFTAZIDIME <=1 SENSITIVE Sensitive     CEFTRIAXONE <=1 SENSITIVE Sensitive     CIPROFLOXACIN <=0.25 SENSITIVE Sensitive     GENTAMICIN <=1 SENSITIVE Sensitive     IMIPENEM <=0.25 SENSITIVE Sensitive     TRIMETH/SULFA <=20 SENSITIVE Sensitive     AMPICILLIN/SULBACTAM 8 SENSITIVE Sensitive      PIP/TAZO <=4 SENSITIVE Sensitive     Extended ESBL NEGATIVE Sensitive     * KLEBSIELLA PNEUMONIAE  Blood Culture ID Panel (Reflexed)     Status: Abnormal   Collection Time: 07/23/17  1:00 PM  Result Value Ref Range Status   Enterococcus species NOT DETECTED NOT DETECTED Final   Listeria monocytogenes NOT DETECTED NOT DETECTED Final   Staphylococcus species NOT DETECTED NOT DETECTED Final   Staphylococcus aureus NOT DETECTED NOT DETECTED Final   Streptococcus species NOT DETECTED NOT DETECTED Final   Streptococcus agalactiae NOT DETECTED NOT DETECTED Final   Streptococcus pneumoniae NOT DETECTED NOT DETECTED Final   Streptococcus pyogenes NOT DETECTED NOT DETECTED Final   Acinetobacter baumannii NOT DETECTED NOT DETECTED Final   Enterobacteriaceae species DETECTED (A) NOT DETECTED Final    Comment: Enterobacteriaceae represent a large family of gram-negative bacteria, not a single organism. CRITICAL RESULT CALLED TO, READ BACK BY AND VERIFIED WITH: B GREEN PHARMD 07/24/17 0549 JDW    Enterobacter cloacae complex NOT DETECTED NOT DETECTED Final   Escherichia coli NOT DETECTED NOT DETECTED Final   Klebsiella oxytoca NOT DETECTED NOT DETECTED Final   Klebsiella pneumoniae DETECTED (A) NOT DETECTED Final    Comment: CRITICAL RESULT CALLED TO, READ BACK BY AND VERIFIED WITH: B GREEN PHARMD 07/24/17 0549 JDW    Proteus species NOT DETECTED NOT DETECTED Final   Serratia marcescens NOT DETECTED NOT DETECTED Final   Carbapenem resistance NOT DETECTED NOT DETECTED Final   Haemophilus influenzae NOT DETECTED NOT DETECTED Final   Neisseria meningitidis NOT DETECTED NOT DETECTED Final   Pseudomonas aeruginosa NOT DETECTED NOT DETECTED Final   Candida albicans NOT DETECTED NOT DETECTED Final   Candida glabrata NOT DETECTED NOT DETECTED Final   Candida krusei NOT DETECTED NOT DETECTED Final   Candida parapsilosis NOT DETECTED NOT DETECTED Final   Candida tropicalis NOT DETECTED NOT DETECTED Final     Comment: Performed at [facility], [place], [place], [place] [address]  MRSA PCR Screening     Status: None   Collection Time: 07/23/17 11:03 PM  Result Value Ref Range Status   MRSA by PCR NEGATIVE NEGATIVE Final    Comment:        The GeneXpert MRSA Assay (FDA approved for NASAL specimens only), is one component of a comprehensive MRSA colonization surveillance program. It is not intended to diagnose MRSA infection nor to guide  or monitor treatment for MRSA infections. Performed at [facility], [place] [address] [place], [place] [address]   Urine Culture     Status: None   Collection Time: 07/24/17  9:17 AM  Result Value Ref Range Status   Specimen Description   Final    URINE, CLEAN CATCH Performed at [facility], [place] [address] [place], [place] [address]    Special Requests   Final    NONE Performed at [facility], [place] [address] [place], [place] [address]    Culture   Final    NO GROWTH Performed at [facility], [place] [address], [place], [place] [address]    Report Status 07/25/2017 FINAL  Final  Culture, blood (routine x 2)     Status: None (Preliminary result)   Collection Time: 07/25/17  4:50 AM  Result Value Ref Range Status   Specimen Description BLOOD LEFT WRIST  Final   Special Requests   Final    BOTTLES DRAWN AEROBIC ONLY Blood Culture results may not be optimal due to an inadequate volume of blood received in culture bottles Performed at [facility], [place] [address] [place], [place] [address]    Culture NO GROWTH 4 DAYS  Final   Report Status PENDING  Incomplete  Culture, blood (routine x 2)     Status: None (Preliminary result)   Collection Time: 07/25/17  4:50 AM  Result Value Ref Range Status   Specimen Description BLOOD LEFT HAND  Final   Special Requests   Final    BOTTLES DRAWN AEROBIC ONLY Blood Culture results may not be optimal due to an  inadequate volume of blood received in culture bottles Performed at [facility], [place] [address], [place], [place] [address]    Culture NO GROWTH 4 DAYS  Final   Report Status PENDING  Incomplete     Time coordinating discharge: 45 minutes  SIGNED:   Tawni Millers, MD  [facility] 07/29/2017, 1:22 PM Pager [phone]  If 7PM-7AM, please contact night-coverage [email or web address] Password [email or web address]

## 2017-07-30 LAB — CULTURE, BLOOD (ROUTINE X 2)
CULTURE: NO GROWTH
CULTURE: NO GROWTH

## 2017-07-31 ENCOUNTER — Encounter (HOSPITAL_BASED_OUTPATIENT_CLINIC_OR_DEPARTMENT_OTHER): Payer: Self-pay | Admitting: Emergency Medicine

## 2017-07-31 ENCOUNTER — Emergency Department (HOSPITAL_BASED_OUTPATIENT_CLINIC_OR_DEPARTMENT_OTHER): Payer: Medicare HMO

## 2017-07-31 ENCOUNTER — Other Ambulatory Visit: Payer: Self-pay

## 2017-07-31 ENCOUNTER — Observation Stay (HOSPITAL_BASED_OUTPATIENT_CLINIC_OR_DEPARTMENT_OTHER)
Admission: EM | Admit: 2017-07-31 | Discharge: 2017-08-03 | Disposition: A | Discharge status: Home / Self Care | Payer: Medicare HMO | Attending: Internal Medicine | Admitting: Internal Medicine

## 2017-07-31 DIAGNOSIS — D259 Leiomyoma of uterus, unspecified: Secondary | ICD-10-CM | POA: Diagnosis not present

## 2017-07-31 DIAGNOSIS — N184 Chronic kidney disease, stage 4 (severe): Secondary | ICD-10-CM | POA: Diagnosis not present

## 2017-07-31 DIAGNOSIS — Z87440 Personal history of urinary (tract) infections: Secondary | ICD-10-CM | POA: Diagnosis not present

## 2017-07-31 DIAGNOSIS — N183 Chronic kidney disease, stage 3 unspecified: Secondary | ICD-10-CM | POA: Diagnosis present

## 2017-07-31 DIAGNOSIS — Z905 Acquired absence of kidney: Secondary | ICD-10-CM | POA: Insufficient documentation

## 2017-07-31 DIAGNOSIS — R112 Nausea with vomiting, unspecified: Secondary | ICD-10-CM | POA: Diagnosis present

## 2017-07-31 DIAGNOSIS — Z7952 Long term (current) use of systemic steroids: Secondary | ICD-10-CM | POA: Insufficient documentation

## 2017-07-31 DIAGNOSIS — D849 Immunodeficiency, unspecified: Secondary | ICD-10-CM | POA: Diagnosis present

## 2017-07-31 DIAGNOSIS — Z8614 Personal history of Methicillin resistant Staphylococcus aureus infection: Secondary | ICD-10-CM

## 2017-07-31 DIAGNOSIS — I129 Hypertensive chronic kidney disease with stage 1 through stage 4 chronic kidney disease, or unspecified chronic kidney disease: Secondary | ICD-10-CM | POA: Diagnosis not present

## 2017-07-31 DIAGNOSIS — Z8719 Personal history of other diseases of the digestive system: Secondary | ICD-10-CM

## 2017-07-31 DIAGNOSIS — M8700 Idiopathic aseptic necrosis of unspecified bone: Secondary | ICD-10-CM

## 2017-07-31 DIAGNOSIS — Z79899 Other long term (current) drug therapy: Secondary | ICD-10-CM | POA: Insufficient documentation

## 2017-07-31 DIAGNOSIS — M329 Systemic lupus erythematosus, unspecified: Secondary | ICD-10-CM | POA: Diagnosis not present

## 2017-07-31 DIAGNOSIS — J90 Pleural effusion, not elsewhere classified: Secondary | ICD-10-CM | POA: Insufficient documentation

## 2017-07-31 DIAGNOSIS — I10 Essential (primary) hypertension: Secondary | ICD-10-CM | POA: Diagnosis present

## 2017-07-31 DIAGNOSIS — S301XXA Contusion of abdominal wall, initial encounter: Secondary | ICD-10-CM | POA: Insufficient documentation

## 2017-07-31 DIAGNOSIS — R1084 Generalized abdominal pain: Secondary | ICD-10-CM | POA: Diagnosis present

## 2017-07-31 DIAGNOSIS — M879 Osteonecrosis, unspecified: Secondary | ICD-10-CM | POA: Insufficient documentation

## 2017-07-31 DIAGNOSIS — Z9081 Acquired absence of spleen: Secondary | ICD-10-CM | POA: Insufficient documentation

## 2017-07-31 DIAGNOSIS — E876 Hypokalemia: Secondary | ICD-10-CM | POA: Diagnosis not present

## 2017-07-31 DIAGNOSIS — Z85528 Personal history of other malignant neoplasm of kidney: Secondary | ICD-10-CM | POA: Insufficient documentation

## 2017-07-31 DIAGNOSIS — M25552 Pain in left hip: Secondary | ICD-10-CM | POA: Diagnosis not present

## 2017-07-31 DIAGNOSIS — K529 Noninfective gastroenteritis and colitis, unspecified: Secondary | ICD-10-CM | POA: Diagnosis not present

## 2017-07-31 DIAGNOSIS — Z8701 Personal history of pneumonia (recurrent): Secondary | ICD-10-CM | POA: Insufficient documentation

## 2017-07-31 DIAGNOSIS — C642 Malignant neoplasm of left kidney, except renal pelvis: Secondary | ICD-10-CM | POA: Diagnosis present

## 2017-07-31 DIAGNOSIS — D899 Disorder involving the immune mechanism, unspecified: Secondary | ICD-10-CM

## 2017-07-31 DIAGNOSIS — G894 Chronic pain syndrome: Secondary | ICD-10-CM | POA: Diagnosis present

## 2017-07-31 DIAGNOSIS — R1110 Vomiting, unspecified: Secondary | ICD-10-CM | POA: Diagnosis present

## 2017-07-31 HISTORY — DX: Contusion of abdominal wall, initial encounter: S30.1XXA

## 2017-07-31 HISTORY — DX: Ventral hernia without obstruction or gangrene: K43.9

## 2017-07-31 HISTORY — DX: Personal history of other diseases of the digestive system: Z87.19

## 2017-07-31 HISTORY — DX: Idiopathic aseptic necrosis of unspecified bone: M87.00

## 2017-07-31 HISTORY — DX: Malignant neoplasm of left kidney, except renal pelvis: C64.2

## 2017-07-31 LAB — COMPREHENSIVE METABOLIC PANEL
ALT: 50 U/L (ref 14–54)
AST: 39 U/L (ref 15–41)
Albumin: 3.4 g/dL — ABNORMAL LOW (ref 3.5–5.0)
Alkaline Phosphatase: 84 U/L (ref 38–126)
Anion gap: 12 (ref 5–15)
BUN: 19 mg/dL (ref 6–20)
CHLORIDE: 116 mmol/L — AB (ref 101–111)
CO2: 19 mmol/L — AB (ref 22–32)
CREATININE: 1.59 mg/dL — AB (ref 0.44–1.00)
Calcium: 8.4 mg/dL — ABNORMAL LOW (ref 8.9–10.3)
GFR calc non Af Amer: 34 mL/min — ABNORMAL LOW (ref >60)
GFR, EST AFRICAN AMERICAN: 40 mL/min — AB (ref >60)
Glucose, Bld: 83 mg/dL (ref 65–99)
Potassium: 3.1 mmol/L — ABNORMAL LOW (ref 3.5–5.1)
SODIUM: 147 mmol/L — AB (ref 135–145)
Total Bilirubin: 0.5 mg/dL (ref 0.3–1.2)
Total Protein: 6.7 g/dL (ref 6.5–8.1)

## 2017-07-31 LAB — URINALYSIS, ROUTINE W REFLEX MICROSCOPIC
Bilirubin Urine: NEGATIVE
GLUCOSE, UA: NEGATIVE mg/dL
HGB URINE DIPSTICK: NEGATIVE
Ketones, ur: NEGATIVE mg/dL
LEUKOCYTES UA: NEGATIVE
Nitrite: NEGATIVE
PH: 6 (ref 5.0–8.0)
PROTEIN: NEGATIVE mg/dL
SPECIFIC GRAVITY, URINE: 1.01 (ref 1.005–1.030)

## 2017-07-31 LAB — LIPASE, BLOOD: LIPASE: 30 U/L (ref 11–51)

## 2017-07-31 LAB — CBC
HEMATOCRIT: 30.1 % — AB (ref 36.0–46.0)
Hemoglobin: 10.1 g/dL — ABNORMAL LOW (ref 12.0–15.0)
MCH: 22.6 pg — ABNORMAL LOW (ref 26.0–34.0)
MCHC: 33.6 g/dL (ref 30.0–36.0)
MCV: 67.3 fL — AB (ref 78.0–100.0)
PLATELETS: 315 10*3/uL (ref 150–400)
RBC: 4.47 MIL/uL (ref 3.87–5.11)
RDW: 17.6 % — ABNORMAL HIGH (ref 11.5–15.5)
WBC: 15.7 10*3/uL — AB (ref 4.0–10.5)

## 2017-07-31 MED ORDER — ONDANSETRON HCL 4 MG PO TABS: 4.0000 mg | ORAL_TABLET | Freq: Four times a day (QID) | ORAL | Status: DC | PRN

## 2017-07-31 MED ORDER — MORPHINE SULFATE (PF) 4 MG/ML IV SOLN
4.0000 mg | Freq: Once | INTRAVENOUS | Status: AC
  Administered 2017-07-31: 4 mg via INTRAVENOUS
  Filled 2017-07-31: qty 1

## 2017-07-31 MED ORDER — ENOXAPARIN SODIUM 40 MG/0.4ML ~~LOC~~ SOLN: 40.0000 mg | Freq: Every day | SUBCUTANEOUS | Status: DC

## 2017-07-31 MED ORDER — SODIUM CHLORIDE 0.9 % IV SOLN
2.0000 g | Freq: Every day | INTRAVENOUS | Status: DC
  Administered 2017-07-31 – 2017-08-02 (×3): 2 g via INTRAVENOUS
  Filled 2017-07-31 (×2): qty 20
  Filled 2017-07-31 (×2): qty 2

## 2017-07-31 MED ORDER — ACETAMINOPHEN 650 MG RE SUPP: 650.0000 mg | Freq: Four times a day (QID) | RECTAL | Status: DC | PRN

## 2017-07-31 MED ORDER — ONDANSETRON HCL 4 MG/2ML IJ SOLN
4.0000 mg | Freq: Four times a day (QID) | INTRAMUSCULAR | Status: DC | PRN
  Administered 2017-07-31 – 2017-08-01 (×2): 4 mg via INTRAVENOUS
  Filled 2017-07-31 (×2): qty 2

## 2017-07-31 MED ORDER — TRAZODONE HCL 50 MG PO TABS
50.0000 mg | ORAL_TABLET | Freq: Every evening | ORAL | Status: DC | PRN
  Administered 2017-07-31 – 2017-08-02 (×3): 50 mg via ORAL
  Filled 2017-07-31 (×3): qty 1

## 2017-07-31 MED ORDER — MORPHINE SULFATE (PF) 4 MG/ML IV SOLN
4.0000 mg | Freq: Once | INTRAVENOUS | Status: AC
Start: 2017-07-31 — End: 2017-07-31
  Administered 2017-07-31: 4 mg via INTRAVENOUS
  Filled 2017-07-31: qty 1

## 2017-07-31 MED ORDER — SODIUM CHLORIDE 0.9 % IV BOLUS
1000.0000 mL | Freq: Once | INTRAVENOUS | Status: AC
  Administered 2017-07-31: 1000 mL via INTRAVENOUS

## 2017-07-31 MED ORDER — ONDANSETRON HCL 4 MG/2ML IJ SOLN
4.0000 mg | Freq: Once | INTRAMUSCULAR | Status: AC
  Administered 2017-07-31: 4 mg via INTRAVENOUS
  Filled 2017-07-31: qty 2

## 2017-07-31 MED ORDER — POTASSIUM CHLORIDE CRYS ER 20 MEQ PO TBCR
40.0000 meq | EXTENDED_RELEASE_TABLET | Freq: Once | ORAL | Status: AC
  Administered 2017-07-31: 40 meq via ORAL
  Filled 2017-07-31: qty 2

## 2017-07-31 MED ORDER — SODIUM CHLORIDE 0.9 % IV BOLUS
500.0000 mL | Freq: Once | INTRAVENOUS | Status: AC
  Administered 2017-07-31: 500 mL via INTRAVENOUS

## 2017-07-31 MED ORDER — ACETAMINOPHEN 325 MG PO TABS: 650.0000 mg | ORAL_TABLET | Freq: Four times a day (QID) | ORAL | Status: DC | PRN

## 2017-07-31 MED ORDER — POTASSIUM CHLORIDE IN NACL 20-0.45 MEQ/L-% IV SOLN
INTRAVENOUS | Status: DC
  Administered 2017-07-31: 23:00:00 via INTRAVENOUS
  Filled 2017-07-31: qty 1000

## 2017-07-31 MED ORDER — PROMETHAZINE HCL 25 MG/ML IJ SOLN
12.5000 mg | Freq: Once | INTRAMUSCULAR | Status: AC
  Administered 2017-07-31: 12.5 mg via INTRAVENOUS
  Filled 2017-07-31: qty 1

## 2017-07-31 MED ORDER — IOPAMIDOL (ISOVUE-300) INJECTION 61%
30.0000 mL | Freq: Once | INTRAVENOUS | Status: AC | PRN
  Administered 2017-07-31: 30 mL via ORAL

## 2017-07-31 NOTE — ED Notes (Signed)
Report and paper work given to [employer], Therapist, sports with [employer] 

## 2017-07-31 NOTE — ED Triage Notes (Addendum)
Pt c/o generalized abd pain with N/V since last night. Pt was just discharged from being admitted for sepsis due to UTI.

## 2017-07-31 NOTE — ED Provider Notes (Signed)
Emergency Department Provider Note   I have reviewed the triage vital signs and the nursing notes.   HISTORY  Chief Complaint Abdominal Pain   HPI Carolyn Zamora is a 62 y.o. female with past medical history of chronic kidney disease, lupus, hypertension and recent admission for urinary sepsis presents to the emergency department with severe abdominal pain and vomiting.  The patient states she was feeling better after treatment in the hospital and discharged 2 days ago.  She is taking her antibiotics and seem to be recovering.  Last night the patient developed diffuse abdominal pain, nausea, vomiting.  A small amount of loose stool today but no heavy diarrhea. Denies any chest pain or dyspnea.  No fevers or chills.  She states she is having pain all over.  Past Medical History:  Diagnosis Date  . Anemia   . CKD (chronic kidney disease), stage IV ([place])   . History of kidney cancer   . Hypertension   . Lupus [facility])     Patient Active Problem List   Diagnosis Date Noted  . Sepsis ([place]) 07/23/2017  . CKD (chronic kidney disease), stage IV ([place]) 07/23/2017  . Pressure injury of skin 12/28/2015  . Acute renal failure (ARF) ([place]) 12/27/2015  . UTI (urinary tract infection) 12/27/2015  . Hyperkalemia 12/27/2015  . Acute renal failure superimposed on stage 4 chronic kidney disease ([place]) 12/27/2015  . Hypertension   . Lupus [facility])     Past Surgical History:  Procedure Laterality Date  . MULTIPLE TOOTH EXTRACTIONS    . NEPHRECTOMY    . SPLENECTOMY       Allergies Patient has no known allergies.  Family History  Problem Relation Age of Onset  . Hypertension Mother   . Diabetes Mellitus I Mother   . Lung cancer Father   . Hypertension Sister   . Colon cancer Brother     Social History Social History   Tobacco Use  . Smoking status: Never Smoker  . Smokeless tobacco: Never Used  Substance Use Topics  . Alcohol use: No  . Drug use: No    Review of  Systems  Constitutional: No fever/chills Eyes: No visual changes. ENT: No sore throat. Cardiovascular: Denies chest pain. Respiratory: Denies shortness of breath. Gastrointestinal: Positive abdominal pain. Positive nausea and  vomiting. Positive diarrhea.  No constipation. Genitourinary: Negative for dysuria. Musculoskeletal: Negative for back pain. Skin: Negative for rash. Neurological: Negative for headaches, focal weakness or numbness.  10-point ROS otherwise negative.  ____________________________________________   PHYSICAL EXAM:  VITAL SIGNS: ED Triage Vitals  Enc Vitals Group     BP 07/31/17 1304 (!) 157/88     Pulse Rate 07/31/17 1304 92     Resp 07/31/17 1304 20     Temp 07/31/17 1304 98.5 F (36.9 C)     Temp Source 07/31/17 1304 Oral     SpO2 07/31/17 1415 100 %     Weight 07/31/17 1303 165 lb (74.8 kg)     Height 07/31/17 1303 5\' 1"  (1.549 m)     Pain Score 07/31/17 1303 10   Constitutional: Alert and oriented. Patient appears uncomfortable. Eyes: Conjunctivae are normal.  Head: Atraumatic. Nose: No congestion/rhinnorhea. Mouth/Throat: Mucous membranes are moist.  Neck: No stridor.  Cardiovascular: Normal rate, regular rhythm. Good peripheral circulation. Grossly normal heart sounds.   Respiratory: Normal respiratory effort.  No retractions. Lungs CTAB. Gastrointestinal: Soft with diffuse tenderness to palpation. No guarding or rebound tenderness. No distention.  Musculoskeletal: No lower  extremity tenderness nor edema. No gross deformities of extremities. Neurologic:  Normal speech and language. No gross focal neurologic deficits are appreciated.  Skin:  Skin is warm, dry and intact. No rash noted.  ____________________________________________   LABS (all labs ordered are listed, but only abnormal results are displayed)  Labs Reviewed  COMPREHENSIVE METABOLIC PANEL - Abnormal; Notable for the following components:      Result Value   Sodium 147 (*)     Potassium 3.1 (*)    Chloride 116 (*)    CO2 19 (*)    Creatinine, Ser 1.59 (*)    Calcium 8.4 (*)    Albumin 3.4 (*)    GFR calc non Af Amer 34 (*)    GFR calc Af Amer 40 (*)    All other components within normal limits  CBC - Abnormal; Notable for the following components:   WBC 15.7 (*)    Hemoglobin 10.1 (*)    HCT 30.1 (*)    MCV 67.3 (*)    MCH 22.6 (*)    RDW 17.6 (*)    All other components within normal limits  URINALYSIS, ROUTINE W REFLEX MICROSCOPIC - Abnormal; Notable for the following components:   Color, Urine STRAW (*)    All other components within normal limits  URINE CULTURE  LIPASE, BLOOD   ____________________________________________  RADIOLOGY  CT pending ____________________________________________   PROCEDURES  Procedure(s) performed:   Procedures  None ____________________________________________   INITIAL IMPRESSION / ASSESSMENT AND PLAN / ED COURSE  Pertinent labs & imaging results that were available during my care of the patient were reviewed by me and considered in my medical decision making (see chart for details).  Patient presents to the emergency department for evaluation of generalized abdominal pain and nausea/vomiting. Afebrile here. Labs reviewed and near baseline. No UTI. CT pending with plan to r/o obstruction. Care transferred to Dr. Darl Householder pending CT.    ____________________________________________  FINAL CLINICAL IMPRESSION(S) / ED DIAGNOSES  Final diagnoses:  Generalized abdominal pain  Non-intractable vomiting with nausea, unspecified vomiting type     MEDICATIONS GIVEN DURING THIS VISIT:  Medications  sodium chloride 0.9 % bolus 500 mL (500 mLs Intravenous New Bag/Given 07/31/17 1615)  sodium chloride 0.9 % bolus 500 mL (0 mLs Intravenous Stopped 07/31/17 1520)  morphine 4 MG/ML injection 4 mg (4 mg Intravenous Given 07/31/17 1406)  ondansetron (ZOFRAN) injection 4 mg (4 mg Intravenous Given 07/31/17 1406)   iopamidol (ISOVUE-300) 61 % injection 30 mL (30 mLs Oral Contrast Given 07/31/17 1526)  morphine 4 MG/ML injection 4 mg (4 mg Intravenous Given 07/31/17 1615)  promethazine (PHENERGAN) injection 12.5 mg (12.5 mg Intravenous Given 07/31/17 1611)    Note:  This document was prepared using Dragon voice recognition software and may include unintentional dictation errors.  Nanda Quinton, MD Emergency Medicine    Long, Wonda [facility], MD 07/31/17 [phone]

## 2017-07-31 NOTE — ED Notes (Signed)
Spoke with Maudie Mercury at [place] - she will repage hospitalist @ [facility]

## 2017-07-31 NOTE — H&P (Signed)
History and Physical    Omer Monter BMW:[id number] DOB: [birth year] DOA: 07/31/2017  PCP: Beckie Salts, MD   Patient coming from: Home [facility]  Chief Complaint: Vomiting, Diarrhea, Abdominal Pain  HPI: Carolyn Zamora is a 62 y.o. 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  Patient presented with complaints of vomiting, diarrhea, generalized abdominal pain that started yesterday.  Patient reports multiple episodes of nonbloody emesis and loose stools.  No fever or chills.   Patient just discharged from the hospital 6/13-6/19/19-managed for sepsis secondary to Klebsiella pneumonia bacteremia and UTI.  Discharged home on cephalexin.  Reported improvements clinically until yesterday.  ED Course: Afebrile, systolic blood pressure 272Z to 180s.  WBC-elevated 15.7.  K low 3.1.  Sodium- 147.  Creatinine-improved compared to prior 1.59. lipase-30.  CT abdomen and pelvis wo contrast-new small volume free fluid in pelvis, ?  Gynecologic in etiology, pelvic ultrasound if referrable symptoms, small left abdominal wall fluid collection associated with fascial defect (see detailed report), bilateral femoral AVN.  Subsequent transvaginal and transabdominal pelvic Us-uterine fibroid.  Small amount of fluid in the region of right ovary.  Patient was given 2 L normal saline bolus.  Hospitalist was called to admit and transfer from [facility] to [place].  Review of Systems: As per HPI otherwise 10 point review of systems negative.   Past Medical History:  Diagnosis Date  . Anemia   . CKD (chronic kidney disease), stage IV ([place])   . History of kidney cancer   . Hypertension   . Lupus [facility])     Past Surgical History:  Procedure Laterality Date  . MULTIPLE TOOTH EXTRACTIONS    . NEPHRECTOMY    . SPLENECTOMY       reports that she has never smoked. She has never used smokeless tobacco. She reports that she does not drink alcohol or  use drugs.  No Known Allergies  Family History  Problem Relation Age of Onset  . Hypertension Mother   . Diabetes Mellitus I Mother   . Lung cancer Father   . Hypertension Sister   . Colon cancer Brother     Prior to Admission medications   Medication Sig Start Date End Date Taking? Authorizing Provider  amLODipine (NORVASC) 10 MG tablet Take 10 mg by mouth daily. 07/23/17   [provider]  cephALEXin (KEFLEX) 500 MG capsule Take 1 capsule (500 mg total) by mouth every 6 (six) hours for 4 days. 07/29/17 08/02/17  Arrien, Jimmy Picket, MD  furosemide (LASIX) 40 MG tablet Take 40 mg by mouth daily. 07/02/17   [provider]  HYDROcodone-acetaminophen (NORCO/VICODIN) 5-325 MG tablet Take 1 tablet by mouth every 8 (eight) hours as needed for pain. 12/15/15   [provider]  hydroxychloroquine (PLAQUENIL) 200 MG tablet Take 200 mg by mouth daily. 12/15/15   [provider]  hydrOXYzine (ATARAX/VISTARIL) 50 MG tablet Take 50 mg by mouth 3 (three) times daily as needed. 07/06/17   [provider]  mycophenolate (CELLCEPT) 500 MG tablet Take 500 mg by mouth daily. 03/18/17   [provider]  oxyCODONE-acetaminophen (PERCOCET/ROXICET) 5-325 MG per tablet Take by mouth every 4 (four) hours as needed for severe pain.    [provider]  predniSONE (DELTASONE) 10 MG tablet Take 10 mg by mouth daily with breakfast.    [provider]  predniSONE (DELTASONE) 20 MG tablet Take two tablets daily for 3 days, then resume 10 mg tablet daily.  07/29/17   Arrien, Jimmy Picket, MD  pregabalin (LYRICA) 150 MG capsule Take 150 mg by mouth 2 (two) times daily.    [provider]  traZODone (DESYREL) 50 MG tablet Take 50 mg by mouth daily. 06/29/17   [provider]  Vitamin D, Ergocalciferol, (DRISDOL) 50000 units CAPS capsule Take 1 capsule by mouth once a week. 07/01/17   [provider]    Physical Exam: Vitals:     07/31/17 1945 07/31/17 2000 07/31/17 2100 07/31/17 2207  BP:  (!) 157/82 134/80 139/88  Pulse: 96 92 85 92  Resp:    16  Temp:    98.4 F (36.9 C)  TempSrc:    Oral  SpO2: 100% 99% 98% 100%  Weight:    79.7 kg (175 lb 11.3 oz)  Height:    5\' 2"  (1.575 m)    Constitutional: NAD, calm, comfortable Vitals:   07/31/17 1945 07/31/17 2000 07/31/17 2100 07/31/17 2207  BP:  (!) 157/82 134/80 139/88  Pulse: 96 92 85 92  Resp:    16  Temp:    98.4 F (36.9 C)  TempSrc:    Oral  SpO2: 100% 99% 98% 100%  Weight:    79.7 kg (175 lb 11.3 oz)  Height:    5\' 2"  (1.575 m)   Eyes: PERRL, lids and conjunctivae normal ENMT: Mucous membranes are dry. Posterior pharynx clear of any exudate or lesions.Normal dentition.  Neck: normal, supple, no masses, no thyromegaly Respiratory: clear to auscultation bilaterally, no wheezing, no crackles. Normal respiratory effort. No accessory muscle use.  Cardiovascular: Regular rate and rhythm, no murmurs / rubs / gallops. Trace extremity edema. 2+ pedal pulses.  Abdomen: Diffuse abdominal tenderness, no masses palpated. No hepatosplenomegaly. Bowel sounds positive.  Musculoskeletal: no clubbing / cyanosis. No joint deformity upper and lower extremities. Good ROM, no contractures. Normal muscle tone.  Skin: diffuse hyperpigmentation- chronic, related to lupus,. No ulcers. No induration Neurologic: CN 2-12 grossly intact. Sensation intact, DTR normal. Strength 5/5 in all 4.  Psychiatric: Normal judgment and insight. Alert and oriented x 3. Normal mood.   Labs on Admission: I have personally reviewed following labs and imaging studies  CBC: Recent Labs  Lab 07/25/17 0450 07/26/17 0430 07/27/17 0359 07/28/17 0454 07/29/17 0442 07/31/17 1405  WBC 19.3* 16.8* 8.2 14.8* 15.5* 15.7*  NEUTROABS 17.3* 13.9* 6.1 12.5* 13.0*  --   HGB 8.3* 9.0* 8.8* 10.0* 8.3* 10.1*  HCT 26.1* 28.3* 28.7* 32.8* 26.1* 30.1*  MCV 69.8* 70.9* 71.4* 71.6* 69.8* 67.3*  PLT 74*  100* 104* 205 188 191   Basic Metabolic Panel: Recent Labs  Lab 07/26/17 0430 07/27/17 0359 07/28/17 0454 07/28/17 0719 07/29/17 0442 07/31/17 1405  NA 146* 147* 146*  --  146* 147*  K 3.8 3.5 6.6* 5.4* 4.1 3.1*  CL 121* 123* 121*  --  119* 116*  CO2 18* 16* 17*  --  19* 19*  GLUCOSE 87 67 100*  --  91 83  BUN 33* 28* 24*  --  25* 19  CREATININE 2.24* 1.87* 1.76*  --  1.74* 1.59*  CALCIUM 8.0* 7.8* 8.0*  --  7.5* 8.4*   Liver Function Tests: Recent Labs  Lab 07/31/17 1405  AST 39  ALT 50  ALKPHOS 84  BILITOT 0.5  PROT 6.7  ALBUMIN 3.4*   Recent Labs  Lab 07/31/17 1405  LIPASE 30   Urine analysis:    Component Value Date/Time   COLORURINE STRAW (A) 07/31/2017 1405  APPEARANCEUR CLEAR 07/31/2017 1405   LABSPEC 1.010 07/31/2017 1405   PHURINE 6.0 07/31/2017 1405   GLUCOSEU NEGATIVE 07/31/2017 1405   HGBUR NEGATIVE 07/31/2017 1405   BILIRUBINUR NEGATIVE 07/31/2017 1405   KETONESUR NEGATIVE 07/31/2017 1405   PROTEINUR NEGATIVE 07/31/2017 1405   NITRITE NEGATIVE 07/31/2017 1405   LEUKOCYTESUR NEGATIVE 07/31/2017 1405    Radiological Exams on Admission: Ct Abdomen Pelvis Wo Contrast  Result Date: 07/31/2017 CLINICAL DATA:  Diffuse abdominal pain for 1 day, distension, nausea and vomiting. Assess high-grade bowel obstruction. History of splenectomy, chronic kidney disease, lupus, renal cancer. EXAM: CT ABDOMEN AND PELVIS WITHOUT CONTRAST TECHNIQUE: Multidetector CT imaging of the abdomen and pelvis was performed following the standard protocol without IV contrast. COMPARISON:  CT abdomen and pelvis May 08, 2017 FINDINGS: LOWER CHEST: New small pleural effusions. Heart size is normal. No pericardial effusion. Stable nodular scarring LEFT lung base. HEPATOBILIARY: Normal. PANCREAS: Normal.  Tail is prolapsed in the renal fossa. SPLEEN: Spondylosis.  Prolapsed into renal fossa. ADRENALS/URINARY TRACT: Status post LEFT nephrectomy. No nephrolithiasis, hydronephrosis;  limited assessment for renal masses by nonenhanced CT. The unopacified ureters are normal in course and caliber. Urinary bladder is partially distended and unremarkable. Normal adrenal glands. STOMACH/BOWEL: The stomach, small and large bowel are normal in course and caliber without inflammatory changes, sensitivity decreased by lack of enteric contrast. Normal appendix. VASCULAR/LYMPHATIC: Aortoiliac vessels are normal in course and caliber. Mild calcific atherosclerosis. No lymphadenopathy by CT size criteria. REPRODUCTIVE: Similar mildly enlarged dense uterus compatible with leiomyoma with subserosal 3.3 cm calcified leiomyoma, less likely calcified adnexa. OTHER: Small volume free fluid in pelvis, and about the RIGHT presumed subserosal leiomyoma. No intraperitoneal free air. MUSCULOSKELETAL: 10 x 2.3 cm fluid collection LEFT anterior abdominal wall subcutaneous fat with fascial defect. Bilateral femoral head avascular necrosis, slight collapse on the RIGHT. 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 Stable grade 1 L5-S1 retrolisthesis with vacuum disc. Severe L4-5 neural foraminal narrowing. IMPRESSION: 1. New small volume free fluid in pelvis, considering distribution this may gynecologic in etiology. Consider pelvic ultrasound if there are referable symptoms. 2. Similar LEFT anterior abdominal wall fluid collection associated with fascial defect, possibly related to herniorrhaphy. Status post LEFT nephrectomy and splenectomy. 3. Stable grade 2 L4-5 anterolisthesis, severe L4-5 neural foraminal narrowing. 4. Bilateral femoral head AVN, bilateral femoral head avascular necrosis with slight collapse on the RIGHT. 5. New small pleural effusions. Aortic Atherosclerosis (ICD10-I70.0). Electronically Signed   By: Elon Alas M.D.   On: 07/31/2017 15:58   Dg Chest 2 View  Result Date: 07/31/2017 CLINICAL DATA:  Nausea, vomiting. Abdominal  pain. Cough and weakness. EXAM: CHEST - 2 VIEW COMPARISON:  07/23/2017 FINDINGS: Heart size is accentuated by the AP technique. There is mild prominence of interstitial markings of the lungs, increased since the prior study. More focal opacity at the LEFT lung base may represent atelectasis or infiltrate. IMPRESSION: Increased prominence of interstitial markings, raising the question viral infection. Question early infiltrate versus atelectasis in the LEFT lung base. Electronically Signed   By: Nolon Nations M.D.   On: 07/31/2017 17:38   US Transvaginal Non-ob  Result Date: 07/31/2017 CLINICAL DATA:  Free fluid identified on CT exam. Generalized abdominal pain. Nausea and vomiting for 2 days. Recently hospitalized urinary sepsis. Patient is postmenopausal. EXAM: TRANSABDOMINAL AND TRANSVAGINAL ULTRASOUND OF PELVIS TECHNIQUE: Both transabdominal and transvaginal ultrasound examinations of the pelvis were performed. Transabdominal technique was performed for global imaging  of the pelvis including uterus, ovaries, adnexal regions, and pelvic cul-de-sac. It was necessary to proceed with endovaginal exam following the transabdominal exam to visualize the uterus and endometrium, ovaries. COMPARISON:  CT of the abdomen and pelvis on 07/31/2017 and MRI of the pelvis on 04/25/2014 FINDINGS: Uterus Measurements: 8.2 x 5.5 x 4.6 centimeters. There is a large LOWER uterine segment fibroid replacing the normal architecture LOWER uterus. Fibroid measures 4.8 x 5.2 centimeters, larger when compared to prior study. The more normal fundal portion of the uterus is not well seen sonographically. Endometrium Thickness: Not visualized. Right ovary Measurements: The ovary is not visualized, either absent or obscured. Left ovary Measurements: The ovary is not visualized, either absent or obscured. Other findings Trace free pelvic fluid noted. Study quality is degraded by overlying bowel gas and full bladder. IMPRESSION: 1.  Ultrasound is limited in further characterization of the pelvic abnormalities. Large LOWER uterine segment fibroid measures at least 5.2 centimeters and replaces the normal anatomy of the LOWER uterus. I suspect a large calcified fundal fibroid which is not imaged on the ultrasound. 2. Nonvisualized endometrium. 3. The ovaries are not imaged. The small amount of fluid seen in the region of the RIGHT ovary is not imaged sonographically. Electronically Signed   By: Nolon Nations M.D.   On: 07/31/2017 17:36   US Pelvis Complete  Result Date: 07/31/2017 CLINICAL DATA:  Free fluid identified on CT exam. Generalized abdominal pain. Nausea and vomiting for 2 days. Recently hospitalized urinary sepsis. Patient is postmenopausal. EXAM: TRANSABDOMINAL AND TRANSVAGINAL ULTRASOUND OF PELVIS TECHNIQUE: Both transabdominal and transvaginal ultrasound examinations of the pelvis were performed. Transabdominal technique was performed for global imaging of the pelvis including uterus, ovaries, adnexal regions, and pelvic cul-de-sac. It was necessary to proceed with endovaginal exam following the transabdominal exam to visualize the uterus and endometrium, ovaries. COMPARISON:  CT of the abdomen and pelvis on 07/31/2017 and MRI of the pelvis on 04/25/2014 FINDINGS: Uterus Measurements: 8.2 x 5.5 x 4.6 centimeters. There is a large LOWER uterine segment fibroid replacing the normal architecture LOWER uterus. Fibroid measures 4.8 x 5.2 centimeters, larger when compared to prior study. The more normal fundal portion of the uterus is not well seen sonographically. Endometrium Thickness: Not visualized. Right ovary Measurements: The ovary is not visualized, either absent or obscured. Left ovary Measurements: The ovary is not visualized, either absent or obscured. Other findings Trace free pelvic fluid noted. Study quality is degraded by overlying bowel gas and full bladder. IMPRESSION: 1. Ultrasound is limited in further  characterization of the pelvic abnormalities. Large LOWER uterine segment fibroid measures at least 5.2 centimeters and replaces the normal anatomy of the LOWER uterus. I suspect a large calcified fundal fibroid which is not imaged on the ultrasound. 2. Nonvisualized endometrium. 3. The ovaries are not imaged. The small amount of fluid seen in the region of the RIGHT ovary is not imaged sonographically. Electronically Signed   By: Nolon Nations M.D.   On: 07/31/2017 17:36    EKG: None   Assessment/Plan Active Problems:   Hypertension   Lupus ([facility])   CKD (chronic kidney disease), stage IV ([facility])   Intractable vomiting   Intractable vomiting with diarrhea-?gastroenteritis.  WBC persistently elevated 15.  CT abdomen and pelvis Wo contrast-small volume free fluid in pelvis, subsequent transabdominal and transvaginal pelvic ultrasound-shows fibroids, small amount of fluid in the region of right ovary is not imaged sonographically.  Recent hospital admission for Klebsiella bacteremia+UTI.  Immunosuppressed.  -Rule out  C. Difficile, considering broad-spectrum antibiotic exposure -IV fluids 1/2 Ns +20Kcl 75cc/hr X 12 hrs -Bowel rest with clear liquid diet -Zofran -CBC a.m. - ?relationship of small free abdominal fluid to recent UTI or bacteremia  Hypokalemia- K- 3.1 -Check magnesium -Replete, BMp a.m  Recent Klebsiella bacteremia and UTI-cultures resistant to ampicillin otherwise pansensitive.  Patient discharged home on cephalexin to complete 4 more days.  Persistent leukocytosis. -Continue antibiotics with IV 2g ceftriaxone  CKD 4-creatinine 1.59, improved compared to discharge creatinine 1.7  07/29/17. -BMP a.m.  Lupus- stable. -Continue home hydroxyzine, hold home mycophenolate in the setting of possible gastroenteritis -Continue home prednisone, doubt need for stress to steroids at this time  DVT prophylaxis: Lovenox Code Status: Full Family Communication: None at  bedside Disposition Plan: 1-2 days Consults called:None Admission status: Obs, med-surg   Bethena Roys MD [facility] Pager [phone] From 6PM-2AM.  Otherwise please contact night-coverage [email or web address] Password [facility]  07/31/2017, 10:33 PM

## 2017-07-31 NOTE — ED Notes (Signed)
Pt had sips of water with potassium- unable to keep that down. Pt vomited x 2. Visualized potassium in emesis.

## 2017-08-01 ENCOUNTER — Encounter (HOSPITAL_COMMUNITY): Payer: Self-pay | Admitting: Internal Medicine

## 2017-08-01 DIAGNOSIS — R112 Nausea with vomiting, unspecified: Secondary | ICD-10-CM | POA: Diagnosis not present

## 2017-08-01 DIAGNOSIS — S301XXA Contusion of abdominal wall, initial encounter: Secondary | ICD-10-CM

## 2017-08-01 DIAGNOSIS — M8700 Idiopathic aseptic necrosis of unspecified bone: Secondary | ICD-10-CM

## 2017-08-01 HISTORY — DX: Idiopathic aseptic necrosis of unspecified bone: M87.00

## 2017-08-01 HISTORY — DX: Contusion of abdominal wall, initial encounter: S30.1XXA

## 2017-08-01 LAB — BASIC METABOLIC PANEL
ANION GAP: 6 (ref 5–15)
BUN: 15 mg/dL (ref 6–20)
CALCIUM: 7 mg/dL — AB (ref 8.9–10.3)
CO2: 21 mmol/L — ABNORMAL LOW (ref 22–32)
CREATININE: 1.5 mg/dL — AB (ref 0.44–1.00)
Chloride: 118 mmol/L — ABNORMAL HIGH (ref 101–111)
GFR, EST AFRICAN AMERICAN: 43 mL/min — AB (ref >60)
GFR, EST NON AFRICAN AMERICAN: 37 mL/min — AB (ref >60)
Glucose, Bld: 64 mg/dL — ABNORMAL LOW (ref 65–99)
Potassium: 3.5 mmol/L (ref 3.5–5.1)
Sodium: 145 mmol/L (ref 135–145)

## 2017-08-01 LAB — CBC
HCT: 23.8 % — ABNORMAL LOW (ref 36.0–46.0)
HEMOGLOBIN: 7.3 g/dL — AB (ref 12.0–15.0)
MCH: 22.1 pg — ABNORMAL LOW (ref 26.0–34.0)
MCHC: 30.7 g/dL (ref 30.0–36.0)
MCV: 72.1 fL — ABNORMAL LOW (ref 78.0–100.0)
PLATELETS: 241 10*3/uL (ref 150–400)
RBC: 3.3 MIL/uL — AB (ref 3.87–5.11)
RDW: 17.8 % — ABNORMAL HIGH (ref 11.5–15.5)
WBC: 9.4 10*3/uL (ref 4.0–10.5)

## 2017-08-01 LAB — MAGNESIUM: MAGNESIUM: 0.9 mg/dL — AB (ref 1.7–2.4)

## 2017-08-01 LAB — URINE CULTURE
Culture: <10000 — AB
Special Requests: NORMAL

## 2017-08-01 MED ORDER — HYDROXYCHLOROQUINE SULFATE 200 MG PO TABS
200.0000 mg | ORAL_TABLET | Freq: Every day | ORAL | Status: DC
  Administered 2017-08-01 – 2017-08-03 (×3): 200 mg via ORAL
  Filled 2017-08-01 (×3): qty 1

## 2017-08-01 MED ORDER — PREDNISONE 5 MG PO TABS
10.0000 mg | ORAL_TABLET | Freq: Every day | ORAL | Status: DC
  Administered 2017-08-01 – 2017-08-03 (×3): 10 mg via ORAL
  Filled 2017-08-01 (×3): qty 2

## 2017-08-01 MED ORDER — SODIUM CHLORIDE 0.45 % IV SOLN
INTRAVENOUS | Status: DC
  Administered 2017-08-01: 17:00:00 via INTRAVENOUS

## 2017-08-01 MED ORDER — PREGABALIN 75 MG PO CAPS
150.0000 mg | ORAL_CAPSULE | Freq: Two times a day (BID) | ORAL | Status: DC
  Administered 2017-08-01 – 2017-08-03 (×6): 150 mg via ORAL
  Filled 2017-08-01 (×6): qty 2

## 2017-08-01 MED ORDER — MORPHINE SULFATE (PF) 4 MG/ML IV SOLN
4.0000 mg | INTRAVENOUS | Status: DC | PRN
  Administered 2017-08-01 – 2017-08-03 (×9): 4 mg via INTRAVENOUS
  Filled 2017-08-01 (×9): qty 1

## 2017-08-01 MED ORDER — TRAZODONE HCL 50 MG PO TABS
50.0000 mg | ORAL_TABLET | Freq: Every day | ORAL | Status: DC
  Administered 2017-08-01 – 2017-08-03 (×3): 50 mg via ORAL
  Filled 2017-08-01 (×3): qty 1

## 2017-08-01 MED ORDER — MAGNESIUM SULFATE 4 GM/100ML IV SOLN
4.0000 g | Freq: Once | INTRAVENOUS | Status: AC
  Administered 2017-08-01: 4 g via INTRAVENOUS
  Filled 2017-08-01: qty 100

## 2017-08-01 MED ORDER — HYDROCODONE-ACETAMINOPHEN 5-325 MG PO TABS
1.0000 | ORAL_TABLET | Freq: Three times a day (TID) | ORAL | Status: DC | PRN
  Administered 2017-08-02 – 2017-08-03 (×2): 1 via ORAL
  Filled 2017-08-01 (×2): qty 1

## 2017-08-01 MED ORDER — POTASSIUM CHLORIDE 10 MEQ/100ML IV SOLN
10.0000 meq | INTRAVENOUS | Status: AC
  Administered 2017-08-01 (×2): 10 meq via INTRAVENOUS
  Filled 2017-08-01 (×2): qty 100

## 2017-08-01 MED ORDER — AMLODIPINE BESYLATE 10 MG PO TABS
10.0000 mg | ORAL_TABLET | Freq: Every day | ORAL | Status: DC
  Administered 2017-08-01 – 2017-08-03 (×3): 10 mg via ORAL
  Filled 2017-08-01 (×2): qty 1
  Filled 2017-08-01: qty 2

## 2017-08-01 NOTE — Consult Note (Signed)
Reason for Consult:LLQ fluid collection Referring Physician: Mabry Santarelli is an 62 y.o. female.  HPI: Pt is a 62 yo F who presented to the hospital yesterday with intractable n/v/diarrhea along with abdominal pain.  She had recently been admitted from 07/23/17 to 07/29/17 for sepsis related to klebisella uti.  She denied new fevers.  The pain is mostly soreness.  She did receive lovenox injections upon last admit.  She was found to be more anemia than previously, so CT was done.  She was seen to have a fluid collection in her LLQ abdominal wall.    Past Medical History:  Diagnosis Date  . Abdominal wall hematoma 08/01/2017  . Anemia   . AVN (avascular necrosis of bone) ([place]) 08/01/2017  . CKD (chronic kidney disease), stage IV ([place])   . History of kidney cancer   . Hypertension   . Lupus [facility])     Past Surgical History:  Procedure Laterality Date  . MULTIPLE TOOTH EXTRACTIONS    . NEPHRECTOMY    . SPLENECTOMY      Family History  Problem Relation Age of Onset  . Hypertension Mother   . Diabetes Mellitus I Mother   . Lung cancer Father   . Hypertension Sister   . Colon cancer Brother     Social History:  reports that she has never smoked. She has never used smokeless tobacco. She reports that she does not drink alcohol or use drugs.  Allergies: No Known Allergies  Medications:  Prior to Admission:  Medications Prior to Admission  Medication Sig Dispense Refill Last Dose  . amLODipine (NORVASC) 10 MG tablet Take 10 mg by mouth daily.   07/31/2017 at Unknown time  . cephALEXin (KEFLEX) 500 MG capsule Take 1 capsule (500 mg total) by mouth every 6 (six) hours for 4 days. 16 capsule 0 07/31/2017 at Unknown time  . furosemide (LASIX) 40 MG tablet Take 40 mg by mouth daily.  1 07/31/2017 at Unknown time  . HYDROcodone-acetaminophen (NORCO/VICODIN) 5-325 MG tablet Take 1 tablet by mouth every 8 (eight) hours as needed for pain.   07/23/2017 at Unknown time  . hydrOXYzine  (ATARAX/VISTARIL) 50 MG tablet Take 50 mg by mouth 3 (three) times daily as needed for nausea.   1 Past Week at Unknown time  . mycophenolate (CELLCEPT) 500 MG tablet Take 500 mg by mouth daily.   07/31/2017 at Unknown time  . oxyCODONE-acetaminophen (PERCOCET/ROXICET) 5-325 MG per tablet Take by mouth every 4 (four) hours as needed for severe pain.   Past Week at Unknown time  . predniSONE (DELTASONE) 10 MG tablet Take 10 mg by mouth daily with breakfast.   07/31/2017 at Unknown time  . pregabalin (LYRICA) 150 MG capsule Take 150 mg by mouth 2 (two) times daily.   07/31/2017 at Unknown time  . traZODone (DESYREL) 50 MG tablet Take 50 mg by mouth daily.  0 07/30/2017 at Unknown time  . Vitamin D, Ergocalciferol, (DRISDOL) 50000 units CAPS capsule Take 1 capsule by mouth once a week.  1 07/27/2017  . hydroxychloroquine (PLAQUENIL) 200 MG tablet Take 200 mg by mouth daily.   Not Taking at Unknown time  . predniSONE (DELTASONE) 20 MG tablet Take two tablets daily for 3 days, then resume 10 mg tablet daily. (Patient not taking: Reported on 07/31/2017) 6 tablet 0 Not Taking at Unknown time    Results for orders placed or performed during the hospital encounter of 07/31/17 (from the past 48 hour(s))  Lipase, blood     Status: None   Collection Time: 07/31/17  2:05 PM  Result Value Ref Range   Lipase 30 11 - 51 U/L    Comment: Performed at [facility], [address] [place], [place] [address]  Comprehensive metabolic panel     Status: Abnormal   Collection Time: 07/31/17  2:05 PM  Result Value Ref Range   Sodium 147 (H) 135 - 145 mmol/L   Potassium 3.1 (L) 3.5 - 5.1 mmol/L   Chloride 116 (H) 101 - 111 mmol/L   CO2 19 (L) 22 - 32 mmol/L   Glucose, Bld 83 65 - 99 mg/dL   BUN 19 6 - 20 mg/dL   Creatinine, Ser 1.59 (H) 0.44 - 1.00 mg/dL   Calcium 8.4 (L) 8.9 - 10.3 mg/dL   Total Protein 6.7 6.5 - 8.1 g/dL   Albumin 3.4 (L) 3.5 - 5.0 g/dL   AST 39 15 - 41 U/L   ALT 50 14 - 54 U/L    Alkaline Phosphatase 84 38 - 126 U/L   Total Bilirubin 0.5 0.3 - 1.2 mg/dL   GFR calc non Af Amer 34 (L) >60 mL/min   GFR calc Af Amer 40 (L) >60 mL/min    Comment: (NOTE) The eGFR has been calculated using the CKD EPI equation. This calculation has not been validated in all clinical situations. eGFR's persistently <60 mL/min signify possible Chronic Kidney Disease.    Anion gap 12 5 - 15    Comment: Performed at [facility], [address] [place], [place] [address]  CBC     Status: Abnormal   Collection Time: 07/31/17  2:05 PM  Result Value Ref Range   WBC 15.7 (H) 4.0 - 10.5 K/uL    Comment: WHITE COUNT CONFIRMED ON SMEAR   RBC 4.47 3.87 - 5.11 MIL/uL   Hemoglobin 10.1 (L) 12.0 - 15.0 g/dL   HCT 30.1 (L) 36.0 - 46.0 %   MCV 67.3 (L) 78.0 - 100.0 fL   MCH 22.6 (L) 26.0 - 34.0 pg   MCHC 33.6 30.0 - 36.0 g/dL   RDW 17.6 (H) 11.5 - 15.5 %   Platelets 315 150 - 400 K/uL    Comment: PLATELET COUNT CONFIRMED BY SMEAR SPECIMEN CHECKED FOR CLOTS Performed at [facility], [address] [place], [place] [address]   Urinalysis, Routine w reflex microscopic     Status: Abnormal   Collection Time: 07/31/17  2:05 PM  Result Value Ref Range   Color, Urine STRAW (A) YELLOW   APPearance CLEAR CLEAR   Specific Gravity, Urine 1.010 1.005 - 1.030   pH 6.0 5.0 - 8.0   Glucose, UA NEGATIVE NEGATIVE mg/dL   Hgb urine dipstick NEGATIVE NEGATIVE   Bilirubin Urine NEGATIVE NEGATIVE   Ketones, ur NEGATIVE NEGATIVE mg/dL   Protein, ur NEGATIVE NEGATIVE mg/dL   Nitrite NEGATIVE NEGATIVE   Leukocytes, UA NEGATIVE NEGATIVE    Comment: Microscopic not done on urines with negative protein, blood, leukocytes, nitrite, or glucose < 500 mg/dL. Performed at [facility], [address] [place], [place] [address]   Urine culture     Status: Abnormal   Collection Time: 07/31/17  2:05 PM  Result Value Ref Range   Specimen Description      URINE, CLEAN  CATCH Performed at [facility], [address] [place], [place] [address]    Special Requests  Normal Performed at [facility], [address] [place], [place] [address]    Culture (A)     <10,000 COLONIES/mL INSIGNIFICANT GROWTH Performed at [place] [address] [place], [place] [address]    Report Status 08/01/2017 FINAL   Basic metabolic panel     Status: Abnormal   Collection Time: 08/01/17  7:13 AM  Result Value Ref Range   Sodium 145 135 - 145 mmol/L   Potassium 3.5 3.5 - 5.1 mmol/L   Chloride 118 (H) 101 - 111 mmol/L   CO2 21 (L) 22 - 32 mmol/L   Glucose, Bld 64 (L) 65 - 99 mg/dL   BUN 15 6 - 20 mg/dL   Creatinine, Ser 1.50 (H) 0.44 - 1.00 mg/dL   Calcium 7.0 (L) 8.9 - 10.3 mg/dL   GFR calc non Af Amer 37 (L) >60 mL/min   GFR calc Af Amer 43 (L) >60 mL/min    Comment: (NOTE) The eGFR has been calculated using the CKD EPI equation. This calculation has not been validated in all clinical situations. eGFR's persistently <60 mL/min signify possible Chronic Kidney Disease.    Anion gap 6 5 - 15    Comment: Performed at [facility], [place] [address], [place], [place] [address]  CBC     Status: Abnormal   Collection Time: 08/01/17  7:13 AM  Result Value Ref Range   WBC 9.4 4.0 - 10.5 K/uL   RBC 3.30 (L) 3.87 - 5.11 MIL/uL   Hemoglobin 7.3 (L) 12.0 - 15.0 g/dL   HCT 23.8 (L) 36.0 - 46.0 %   MCV 72.1 (L) 78.0 - 100.0 fL   MCH 22.1 (L) 26.0 - 34.0 pg   MCHC 30.7 30.0 - 36.0 g/dL   RDW 17.8 (H) 11.5 - 15.5 %   Platelets 241 150 - 400 K/uL    Comment: Performed at [facility], [place] [address] [place], [place] [address]  Magnesium     Status: Abnormal   Collection Time: 08/01/17  7:13 AM  Result Value Ref Range   Magnesium 0.9 (LL) 1.7 - 2.4 mg/dL    Comment: CRITICAL RESULT CALLED TO, READ BACK BY AND VERIFIED WITH: D.DUKGU,[id number] '@[id number]'  BY V.WILKINS Performed at [place] [address] [place], [place] [address]     Ct Abdomen Pelvis Wo Contrast  Result Date: 07/31/2017 CLINICAL DATA:  Diffuse abdominal pain for 1 day, distension, nausea and vomiting. Assess high-grade bowel obstruction. History of splenectomy, chronic kidney disease, lupus, renal cancer. EXAM: CT ABDOMEN AND PELVIS WITHOUT CONTRAST TECHNIQUE: Multidetector CT imaging of the abdomen and pelvis was performed following the standard protocol without IV contrast. COMPARISON:  CT abdomen and pelvis May 08, 2017 FINDINGS: LOWER CHEST: New small pleural effusions. Heart size is normal. No pericardial effusion. Stable nodular scarring LEFT lung base. HEPATOBILIARY: Normal. PANCREAS: Normal.  Tail is prolapsed in the renal fossa. SPLEEN: Spondylosis.  Prolapsed into renal fossa. ADRENALS/URINARY TRACT: Status post LEFT nephrectomy. No nephrolithiasis, hydronephrosis; limited assessment for renal masses by nonenhanced CT. The unopacified ureters are normal in course and caliber. Urinary bladder is partially distended and unremarkable. Normal adrenal glands. STOMACH/BOWEL: The stomach, small and large bowel are normal in course and caliber without inflammatory changes, sensitivity decreased by lack of enteric contrast. Normal appendix. VASCULAR/LYMPHATIC: Aortoiliac vessels are normal in course and caliber. Mild calcific atherosclerosis. No lymphadenopathy by CT size criteria. REPRODUCTIVE: Similar mildly enlarged dense uterus  compatible with leiomyoma with subserosal 3.3 cm calcified leiomyoma, less likely calcified adnexa. OTHER: Small volume free fluid in pelvis, and about the RIGHT presumed subserosal leiomyoma. No intraperitoneal free air. MUSCULOSKELETAL: 10 x 2.3 cm fluid collection LEFT anterior abdominal wall subcutaneous fat with fascial defect. Bilateral femoral head avascular necrosis, slight collapse on the RIGHT. 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 Stable grade 1 L5-S1 retrolisthesis with vacuum disc. Severe L4-5 neural foraminal narrowing. IMPRESSION: 1. New small volume free fluid in pelvis, considering distribution this may gynecologic in etiology. Consider pelvic ultrasound if there are referable symptoms. 2. Similar LEFT anterior abdominal wall fluid collection associated with fascial defect, possibly related to herniorrhaphy. Status post LEFT nephrectomy and splenectomy. 3. Stable grade 2 L4-5 anterolisthesis, severe L4-5 neural foraminal narrowing. 4. Bilateral femoral head AVN, bilateral femoral head avascular necrosis with slight collapse on the RIGHT. 5. New small pleural effusions. Aortic Atherosclerosis (ICD10-I70.0). Electronically Signed   By: Elon Alas M.D.   On: 07/31/2017 15:58   Dg Chest 2 View  Result Date: 07/31/2017 CLINICAL DATA:  Nausea, vomiting. Abdominal pain. Cough and weakness. EXAM: CHEST - 2 VIEW COMPARISON:  07/23/2017 FINDINGS: Heart size is accentuated by the AP technique. There is mild prominence of interstitial markings of the lungs, increased since the prior study. More focal opacity at the LEFT lung base may represent atelectasis or infiltrate. IMPRESSION: Increased prominence of interstitial markings, raising the question viral infection. Question early infiltrate versus atelectasis in the LEFT lung base. Electronically Signed   By: Nolon Nations M.D.   On: 07/31/2017 17:38   US Transvaginal Non-ob  Result Date: 07/31/2017 CLINICAL DATA:  Free fluid identified on CT exam. Generalized abdominal pain. Nausea and vomiting for 2 days. Recently hospitalized urinary sepsis. Patient is postmenopausal. EXAM: TRANSABDOMINAL AND TRANSVAGINAL ULTRASOUND OF PELVIS TECHNIQUE: Both transabdominal and transvaginal ultrasound examinations of the pelvis were performed. Transabdominal technique was performed for global imaging of the pelvis including uterus, ovaries, adnexal regions, and  pelvic cul-de-sac. It was necessary to proceed with endovaginal exam following the transabdominal exam to visualize the uterus and endometrium, ovaries. COMPARISON:  CT of the abdomen and pelvis on 07/31/2017 and MRI of the pelvis on 04/25/2014 FINDINGS: Uterus Measurements: 8.2 x 5.5 x 4.6 centimeters. There is a large LOWER uterine segment fibroid replacing the normal architecture LOWER uterus. Fibroid measures 4.8 x 5.2 centimeters, larger when compared to prior study. The more normal fundal portion of the uterus is not well seen sonographically. Endometrium Thickness: Not visualized. Right ovary Measurements: The ovary is not visualized, either absent or obscured. Left ovary Measurements: The ovary is not visualized, either absent or obscured. Other findings Trace free pelvic fluid noted. Study quality is degraded by overlying bowel gas and full bladder. IMPRESSION: 1. Ultrasound is limited in further characterization of the pelvic abnormalities. Large LOWER uterine segment fibroid measures at least 5.2 centimeters and replaces the normal anatomy of the LOWER uterus. I suspect a large calcified fundal fibroid which is not imaged on the ultrasound. 2. Nonvisualized endometrium. 3. The ovaries are not imaged. The small amount of fluid seen in the region of the RIGHT ovary is not imaged sonographically. Electronically Signed   By: Nolon Nations M.D.   On: 07/31/2017 17:36   US Pelvis Complete  Result Date: 07/31/2017 CLINICAL DATA:  Free fluid identified on CT exam. Generalized abdominal pain. Nausea and vomiting for 2 days. Recently hospitalized urinary sepsis. Patient  is postmenopausal. EXAM: TRANSABDOMINAL AND TRANSVAGINAL ULTRASOUND OF PELVIS TECHNIQUE: Both transabdominal and transvaginal ultrasound examinations of the pelvis were performed. Transabdominal technique was performed for global imaging of the pelvis including uterus, ovaries, adnexal regions, and pelvic cul-de-sac. It was necessary to  proceed with endovaginal exam following the transabdominal exam to visualize the uterus and endometrium, ovaries. COMPARISON:  CT of the abdomen and pelvis on 07/31/2017 and MRI of the pelvis on 04/25/2014 FINDINGS: Uterus Measurements: 8.2 x 5.5 x 4.6 centimeters. There is a large LOWER uterine segment fibroid replacing the normal architecture LOWER uterus. Fibroid measures 4.8 x 5.2 centimeters, larger when compared to prior study. The more normal fundal portion of the uterus is not well seen sonographically. Endometrium Thickness: Not visualized. Right ovary Measurements: The ovary is not visualized, either absent or obscured. Left ovary Measurements: The ovary is not visualized, either absent or obscured. Other findings Trace free pelvic fluid noted. Study quality is degraded by overlying bowel gas and full bladder. IMPRESSION: 1. Ultrasound is limited in further characterization of the pelvic abnormalities. Large LOWER uterine segment fibroid measures at least 5.2 centimeters and replaces the normal anatomy of the LOWER uterus. I suspect a large calcified fundal fibroid which is not imaged on the ultrasound. 2. Nonvisualized endometrium. 3. The ovaries are not imaged. The small amount of fluid seen in the region of the RIGHT ovary is not imaged sonographically. Electronically Signed   By: Nolon Nations M.D.   On: 07/31/2017 17:36    Review of Systems  Constitutional: Negative.   HENT: Negative.   Respiratory: Negative.   Cardiovascular: Negative.   Gastrointestinal: Positive for abdominal pain, diarrhea, nausea and vomiting.  Genitourinary: Negative.   Musculoskeletal: Negative.   Skin: Negative.   Neurological: Negative.   Endo/Heme/Allergies: Negative.   Psychiatric/Behavioral: Negative.   All other systems reviewed and are negative.  Blood pressure 125/65, pulse 86, temperature 98.2 F (36.8 C), temperature source Oral, resp. rate 20, height '5\' 2"'  (1.575 m), weight 79.7 kg (175 lb 11.3  oz), SpO2 100 %. Physical Exam  Constitutional: She is oriented to person, place, and time. She appears well-developed and well-nourished. She appears distressed.  HENT:  Head: Normocephalic.  Eyes: Pupils are equal, round, and reactive to light. Conjunctivae are normal. Right eye exhibits no discharge. Left eye exhibits no discharge. No scleral icterus.  Neck: Normal range of motion. Neck supple. No thyromegaly present.  Cardiovascular: Normal rate, regular rhythm and intact distal pulses.  Respiratory: Effort normal and breath sounds normal. No respiratory distress. She has no wheezes. She exhibits no tenderness.  GI: Soft. She exhibits no distension. There is tenderness (mild tenderness LLQ).  Musculoskeletal: Normal range of motion. She exhibits no edema, tenderness or deformity.  Neurological: She is alert and oriented to person, place, and time.  Skin: Skin is warm and dry. No rash noted. She is not diaphoretic. No erythema. No pallor.  Psychiatric: She has a normal mood and affect. Her behavior is normal. Judgment and thought content normal.    Assessment/Plan: Fluid collection LLQ. Does not look infected.   ? Result of bleeding from lovenox injection. Certainly would not cause intractable n/v.   Would hold off on sampling at this time.  Bloody collection from prior lovenox injection most likely.   No external skin changes to make think this is infected.   Risk of this developing calcification.    Stark Klein 08/01/2017, 3:13 PM

## 2017-08-01 NOTE — Progress Notes (Signed)
PROGRESS NOTE    Carolyn Zamora  FBP:[id number] DOB: [birth year] DOA: 07/31/2017 PCP: Beckie Salts, MD  Brief Narrative:62 y.o. 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  Patient presented with complaints of vomiting, diarrhea, generalized abdominal pain that started yesterday.  Patient reports multiple episodes of nonbloody emesis and loose stools.  No fever or chills.   Patient just discharged from the hospital 6/13-6/19/19-managed for sepsis secondary to Klebsiella pneumonia bacteremia and UTI.  Discharged home on cephalexin.  Reported improvements clinically until yesterday.  ED Course: Afebrile, systolic blood pressure 824M to 180s.  WBC-elevated 15.7.  K low 3.1.  Sodium- 147.  Creatinine-improved compared to prior 1.59. lipase-30.  CT abdomen and pelvis wo contrast-new small volume free fluid in pelvis, ?  Gynecologic in etiology, pelvic ultrasound if referrable symptoms, small left abdominal wall fluid collection associated with fascial defect (see detailed report), bilateral femoral AVN.  Subsequent transvaginal and transabdominal pelvic Us-uterine fibroid.  Small amount of fluid in the region of right ovary.  Patient was given 2 L normal saline bolus.  Hospitalist was called to admit and transfer from [facility] to [place].     Assessment & Plan:   Active Problems:   Hypertension   Lupus (HCC)   CKD (chronic kidney disease), stage IV (HCC)   Intractable vomiting  1]Gastroenteritis-patient admitted with nausea,vomitting diarrhea.she has not had anymore since admit.  However she is very nauseous and afraid to take anything through her mouth.  I will keep her on clear liquids for now.  Continue slow IV hydration.  She has not had a BM so a blood stool sample was not able to be sent for C. Difficile.  2] status post Klebsiella bacteremia UTI repeat UA last night is clear.  But upon admission her white count was  15.7 and it has come down to 9.4 after starting the Rocephin.  I would give a total of 4 weeks and then DC the Rocephin.  At this time patient did not have any urinary symptoms of frequency urgency or burning  3] severe hypomagnesemia with magnesium of 0.9/hypokalemia K3.5 -replete potassium and magnesium IV.  4] CKD stage IV stable.  5] history of lupus patient on hydroxyzine, prednisone.  Restart mycophenolate.  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  7] AV necrosis of bilateral hip patient reports pain in the left hip-which was not her presenting complaint at this time.  However CT scan of the abdomen pelvis done for work-up shows New small volume free fluid in pelvis, considering distribution this may gynecologic in etiology. Consider pelvic ultrasound if there are referable symptoms.  Similar LEFT anterior abdominal wall fluid collection associated with fascial defect, possibly related to herniorrhaphy. Status post LEFT nephrectomy and splenectomy. Stable grade 2 L4-5 anterolisthesis, severe L4-5 neural foraminal narrowing. Bilateral femoral head AVN, bilateral femoral head avascular necrosis with slight collapse on the RIGHT. New small pleural effusions. She will need outpatient follow up with ORTHO. . 8]large fibroids -patient is aware that she has fibroids and she has f/u with GYN scheduled this week.  9]ANEMIA with a drop in hemoglobin from 10 to 7 with anterior abdominal wall fluid collection?  Hematoma?  Spontaneous bleed?  Lovenox however DC Lovenox for now and put her on SCD boots  For DVT prophylaxis.  Discussed with Dr. Barry Dienes  who was kind enough to see the patient today.  But will monitor H&H.   DVT prophylaxis: Lovenox Code Status: Full code Family Communication: Discussed with her sister at sequela Kuipers [phone] in  detail. Disposition Plan plan discharge 24 to 48 hours if stable. Consultants: None  Procedures: None Antimicrobials: Rocephin  Subjective: Denies any nausea vomiting or diarrhea since admission.  Objective: Vitals:   07/31/17 2000 07/31/17 2100 07/31/17 2207 08/01/17 0515  BP: (!) 157/82 134/80 139/88 136/70  Pulse: 92 85 92 87  Resp:   16 20  Temp:   98.4 F (36.9 C) 98.2 F (36.8 C)  TempSrc:   Oral Oral  SpO2: 99% 98% 100% 100%  Weight:   79.7 kg (175 lb 11.3 oz)   Height:   5\' 2"  (1.575 m)     Intake/Output Summary (Last 24 hours) at 08/01/2017 0941 Last data filed at 08/01/2017 0422 Gross per 24 hour  Intake 2167.25 ml  Output 110 ml  Net 2057.25 ml   Filed Weights   07/31/17 1303 07/31/17 2207  Weight: 74.8 kg (165 lb) 79.7 kg (175 lb 11.3 oz)    Examination:  General exam: Appears calm and comfortable  Respiratory system: Clear to auscultation. Respiratory effort normal. Cardiovascular system: S1 & S2 heard, RRR. No JVD, murmurs, rubs, gallops or clicks. No pedal edema. Gastrointestinal system: Abdomen is nondistended, soft and generalized tenderness. No organomegaly or masses felt. Normal bowel sounds heard. Central nervous system: Alert and oriented. No focal neurological deficits. Extremities: Symmetric 5 x 5 power. Skin: No rashes, lesions or ulcers Psychiatry: Judgement and insight appear normal. Mood & affect appropriate.     Data Reviewed: I have personally reviewed following labs and imaging studies  CBC: Recent Labs  Lab 07/26/17 0430 07/27/17 0359 07/28/17 0454 07/29/17 0442 07/31/17 1405 08/01/17 0713  WBC 16.8* 8.2 14.8* 15.5* 15.7* 9.4  NEUTROABS 13.9* 6.1 12.5* 13.0*  --   --   HGB 9.0* 8.8* 10.0* 8.3* 10.1* 7.3*  HCT 28.3* 28.7* 32.8* 26.1* 30.1* 23.8*  MCV 70.9* 71.4* 71.6* 69.8* 67.3* 72.1*  PLT 100* 104* 205 188 315 500   Basic Metabolic Panel: Recent Labs  Lab 07/27/17 0359 07/28/17 0454 07/28/17 0719 07/29/17 0442  07/31/17 1405 08/01/17 0713  NA 147* 146*  --  146* 147* 145  K 3.5 6.6* 5.4* 4.1 3.1* 3.5  CL 123* 121*  --  119* 116* 118*  CO2 16* 17*  --  19* 19* 21*  GLUCOSE 67 100*  --  91 83 64*  BUN 28* 24*  --  25* 19 15  CREATININE 1.87* 1.76*  --  1.74* 1.59* 1.50*  CALCIUM 7.8* 8.0*  --  7.5* 8.4* 7.0*  MG  --   --   --   --   --  0.9*   GFR: Estimated Creatinine Clearance: 39 mL/min (A) (by C-G formula based on SCr of 1.5 mg/dL (H)). Liver Function Tests: Recent Labs  Lab 07/31/17 1405  AST 39  ALT 50  ALKPHOS 84  BILITOT 0.5  PROT 6.7  ALBUMIN 3.4*   Recent Labs  Lab 07/31/17 1405  LIPASE 30   No results for input(s): AMMONIA in the last 168 hours. Coagulation Profile: No results for input(s): INR, PROTIME in the last 168 hours. Cardiac Enzymes: No results for input(s): CKTOTAL, CKMB, CKMBINDEX, TROPONINI in the last 168 hours. BNP (last 3 results) No results for input(s): PROBNP in the last 8760 hours. HbA1C: No results for input(s):  HGBA1C in the last 72 hours. CBG: No results for input(s): GLUCAP in the last 168 hours. Lipid Profile: No results for input(s): CHOL, HDL, LDLCALC, TRIG, CHOLHDL, LDLDIRECT in the last 72 hours. Thyroid Function Tests: No results for input(s): TSH, T4TOTAL, FREET4, T3FREE, THYROIDAB in the last 72 hours. Anemia Panel: No results for input(s): VITAMINB12, FOLATE, FERRITIN, TIBC, IRON, RETICCTPCT in the last 72 hours. Sepsis Labs: No results for input(s): PROCALCITON, LATICACIDVEN in the last 168 hours.  Recent Results (from the past 240 hour(s))  Culture, blood (Routine x 2)     Status: Abnormal   Collection Time: 07/23/17  1:00 PM  Result Value Ref Range Status   Specimen Description   Final    BLOOD LEFT ANTECUBITAL Performed at [facility], [address] [place], [place] [address]    Special Requests   Final    BOTTLES DRAWN AEROBIC AND ANAEROBIC Blood Culture adequate volume Performed at [facility], [address] [place], [place] [address]    Culture  Setup Time   Final    IN BOTH AEROBIC AND ANAEROBIC BOTTLES GRAM NEGATIVE RODS CRITICAL RESULT CALLED TO, READ BACK BY AND VERIFIED WITH: B GREEN PHARMD 07/24/17 0549 JDW Performed at [facility], [address], [place], [place] [address]    Culture KLEBSIELLA PNEUMONIAE (A)  Final   Report Status 07/26/2017 FINAL  Final   Organism ID, Bacteria KLEBSIELLA PNEUMONIAE  Final      Susceptibility   Klebsiella pneumoniae - MIC*    AMPICILLIN >=32 RESISTANT Resistant     CEFAZOLIN <=4 SENSITIVE Sensitive     CEFEPIME <=1 SENSITIVE Sensitive     CEFTAZIDIME <=1 SENSITIVE Sensitive     CEFTRIAXONE <=1 SENSITIVE Sensitive     CIPROFLOXACIN <=0.25 SENSITIVE Sensitive     GENTAMICIN <=1 SENSITIVE Sensitive     IMIPENEM <=0.25 SENSITIVE Sensitive     TRIMETH/SULFA <=20 SENSITIVE Sensitive     AMPICILLIN/SULBACTAM 8 SENSITIVE Sensitive     PIP/TAZO <=4 SENSITIVE Sensitive     Extended ESBL NEGATIVE Sensitive     * KLEBSIELLA PNEUMONIAE  Blood Culture ID Panel (Reflexed)     Status: Abnormal   Collection Time: 07/23/17  1:00 PM  Result Value Ref Range Status   Enterococcus species NOT DETECTED NOT DETECTED Final   Listeria monocytogenes NOT DETECTED NOT DETECTED Final   Staphylococcus species NOT DETECTED NOT DETECTED Final   Staphylococcus aureus NOT DETECTED NOT DETECTED Final   Streptococcus species NOT DETECTED NOT DETECTED Final   Streptococcus agalactiae NOT DETECTED NOT DETECTED Final   Streptococcus pneumoniae NOT DETECTED NOT DETECTED Final   Streptococcus pyogenes NOT DETECTED NOT DETECTED Final   Acinetobacter baumannii NOT DETECTED NOT DETECTED Final   Enterobacteriaceae species DETECTED (A) NOT DETECTED Final    Comment: Enterobacteriaceae represent a large family of gram-negative bacteria, not a single organism. CRITICAL RESULT CALLED TO, READ BACK BY AND VERIFIED WITH: B GREEN PHARMD 07/24/17 0549 JDW     Enterobacter cloacae complex NOT DETECTED NOT DETECTED Final   Escherichia coli NOT DETECTED NOT DETECTED Final   Klebsiella oxytoca NOT DETECTED NOT DETECTED Final   Klebsiella pneumoniae DETECTED (A) NOT DETECTED Final    Comment: CRITICAL RESULT CALLED TO, READ BACK BY AND VERIFIED WITH: B GREEN PHARMD 07/24/17 0549 JDW    Proteus species NOT DETECTED NOT DETECTED Final   Serratia marcescens NOT DETECTED NOT DETECTED Final   Carbapenem resistance NOT DETECTED  NOT DETECTED Final   Haemophilus influenzae NOT DETECTED NOT DETECTED Final   Neisseria meningitidis NOT DETECTED NOT DETECTED Final   Pseudomonas aeruginosa NOT DETECTED NOT DETECTED Final   Candida albicans NOT DETECTED NOT DETECTED Final   Candida glabrata NOT DETECTED NOT DETECTED Final   Candida krusei NOT DETECTED NOT DETECTED Final   Candida parapsilosis NOT DETECTED NOT DETECTED Final   Candida tropicalis NOT DETECTED NOT DETECTED Final    Comment: Performed at [facility], [place] [address], [place], [place] [address]  MRSA PCR Screening     Status: None   Collection Time: 07/23/17 11:03 PM  Result Value Ref Range Status   MRSA by PCR NEGATIVE NEGATIVE Final    Comment:        The GeneXpert MRSA Assay (FDA approved for NASAL specimens only), is one component of a comprehensive MRSA colonization surveillance program. It is not intended to diagnose MRSA infection nor to guide or monitor treatment for MRSA infections. Performed at [facility], [place] [address], [place], [place] [address]   Urine Culture     Status: None   Collection Time: 07/24/17  9:17 AM  Result Value Ref Range Status   Specimen Description   Final    URINE, CLEAN CATCH Performed at [facility], [place] [address], [place], [place] [address]    Special Requests   Final    NONE Performed at [facility], [place] [address] [place], [place] [address]    Culture   Final    NO  GROWTH Performed at [facility], [place] [address], [place], [place] [address]    Report Status 07/25/2017 FINAL  Final  Culture, blood (routine x 2)     Status: None   Collection Time: 07/25/17  4:50 AM  Result Value Ref Range Status   Specimen Description   Final    BLOOD LEFT WRIST Performed at [place] [address] [place], [place] [address]    Special Requests   Final    BOTTLES DRAWN AEROBIC ONLY Blood Culture results may not be optimal due to an inadequate volume of blood received in culture bottles Performed at [place] [address] [place], [place] [address]    Culture   Final    NO GROWTH 5 DAYS Performed at [facility], [place] [address], [place], [place] [address]    Report Status 07/30/2017 FINAL  Final  Culture, blood (routine x 2)     Status: None   Collection Time: 07/25/17  4:50 AM  Result Value Ref Range Status   Specimen Description BLOOD LEFT HAND  Final   Special Requests   Final    BOTTLES DRAWN AEROBIC ONLY Blood Culture results may not be optimal due to an inadequate volume of blood received in culture bottles Performed at [facility], [place] [address], [place], [place] [address]    Culture   Final    NO GROWTH 5 DAYS Performed at [facility], [place] [address] [place],  [address]    Report Status 07/30/2017 FINAL  Final         Radiology Studies: Ct Abdomen Pelvis Wo Contrast  Result Date: 07/31/2017 CLINICAL DATA:  Diffuse abdominal pain for 1 day, distension, nausea and vomiting. Assess high-grade bowel obstruction. History of splenectomy, chronic kidney disease, lupus, renal cancer. EXAM: CT ABDOMEN AND PELVIS WITHOUT CONTRAST TECHNIQUE: Multidetector CT imaging  of the abdomen and pelvis was performed following the standard protocol without IV contrast. COMPARISON:  CT abdomen and pelvis May 08, 2017 FINDINGS: LOWER CHEST: New small pleural effusions.  Heart size is normal. No pericardial effusion. Stable nodular scarring LEFT lung base. HEPATOBILIARY: Normal. PANCREAS: Normal.  Tail is prolapsed in the renal fossa. SPLEEN: Spondylosis.  Prolapsed into renal fossa. ADRENALS/URINARY TRACT: Status post LEFT nephrectomy. No nephrolithiasis, hydronephrosis; limited assessment for renal masses by nonenhanced CT. The unopacified ureters are normal in course and caliber. Urinary bladder is partially distended and unremarkable. Normal adrenal glands. STOMACH/BOWEL: The stomach, small and large bowel are normal in course and caliber without inflammatory changes, sensitivity decreased by lack of enteric contrast. Normal appendix. VASCULAR/LYMPHATIC: Aortoiliac vessels are normal in course and caliber. Mild calcific atherosclerosis. No lymphadenopathy by CT size criteria. REPRODUCTIVE: Similar mildly enlarged dense uterus compatible with leiomyoma with subserosal 3.3 cm calcified leiomyoma, less likely calcified adnexa. OTHER: Small volume free fluid in pelvis, and about the RIGHT presumed subserosal leiomyoma. No intraperitoneal free air. MUSCULOSKELETAL: 10 x 2.3 cm fluid collection LEFT anterior abdominal wall subcutaneous fat with fascial defect. Bilateral femoral head avascular necrosis, slight collapse on the RIGHT. 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 Stable grade 1 L5-S1 retrolisthesis with vacuum disc. Severe L4-5 neural foraminal narrowing. IMPRESSION: 1. New small volume free fluid in pelvis, considering distribution this may gynecologic in etiology. Consider pelvic ultrasound if there are referable symptoms. 2. Similar LEFT anterior abdominal wall fluid collection associated with fascial defect, possibly related to herniorrhaphy. Status post LEFT nephrectomy and splenectomy. 3. Stable grade 2 L4-5 anterolisthesis, severe L4-5 neural foraminal narrowing. 4. Bilateral femoral head AVN,  bilateral femoral head avascular necrosis with slight collapse on the RIGHT. 5. New small pleural effusions. Aortic Atherosclerosis (ICD10-I70.0). Electronically Signed   By: Elon Alas M.D.   On: 07/31/2017 15:58   Dg Chest 2 View  Result Date: 07/31/2017 CLINICAL DATA:  Nausea, vomiting. Abdominal pain. Cough and weakness. EXAM: CHEST - 2 VIEW COMPARISON:  07/23/2017 FINDINGS: Heart size is accentuated by the AP technique. There is mild prominence of interstitial markings of the lungs, increased since the prior study. More focal opacity at the LEFT lung base may represent atelectasis or infiltrate. IMPRESSION: Increased prominence of interstitial markings, raising the question viral infection. Question early infiltrate versus atelectasis in the LEFT lung base. Electronically Signed   By: Nolon Nations M.D.   On: 07/31/2017 17:38   US Transvaginal Non-ob  Result Date: 07/31/2017 CLINICAL DATA:  Free fluid identified on CT exam. Generalized abdominal pain. Nausea and vomiting for 2 days. Recently hospitalized urinary sepsis. Patient is postmenopausal. EXAM: TRANSABDOMINAL AND TRANSVAGINAL ULTRASOUND OF PELVIS TECHNIQUE: Both transabdominal and transvaginal ultrasound examinations of the pelvis were performed. Transabdominal technique was performed for global imaging of the pelvis including uterus, ovaries, adnexal regions, and pelvic cul-de-sac. It was necessary to proceed with endovaginal exam following the transabdominal exam to visualize the uterus and endometrium, ovaries. COMPARISON:  CT of the abdomen and pelvis on 07/31/2017 and MRI of the pelvis on 04/25/2014 FINDINGS: Uterus Measurements: 8.2 x 5.5 x 4.6 centimeters. There is a large LOWER uterine segment fibroid replacing the normal architecture LOWER uterus. Fibroid measures 4.8 x 5.2 centimeters, larger when compared to prior study. The more normal fundal portion of the uterus is not well seen sonographically. Endometrium Thickness: Not  visualized. Right ovary Measurements: The ovary is not visualized, either absent or  obscured. Left ovary Measurements: The ovary is not visualized, either absent or obscured. Other findings Trace free pelvic fluid noted. Study quality is degraded by overlying bowel gas and full bladder. IMPRESSION: 1. Ultrasound is limited in further characterization of the pelvic abnormalities. Large LOWER uterine segment fibroid measures at least 5.2 centimeters and replaces the normal anatomy of the LOWER uterus. I suspect a large calcified fundal fibroid which is not imaged on the ultrasound. 2. Nonvisualized endometrium. 3. The ovaries are not imaged. The small amount of fluid seen in the region of the RIGHT ovary is not imaged sonographically. Electronically Signed   By: Nolon Nations M.D.   On: 07/31/2017 17:36   US Pelvis Complete  Result Date: 07/31/2017 CLINICAL DATA:  Free fluid identified on CT exam. Generalized abdominal pain. Nausea and vomiting for 2 days. Recently hospitalized urinary sepsis. Patient is postmenopausal. EXAM: TRANSABDOMINAL AND TRANSVAGINAL ULTRASOUND OF PELVIS TECHNIQUE: Both transabdominal and transvaginal ultrasound examinations of the pelvis were performed. Transabdominal technique was performed for global imaging of the pelvis including uterus, ovaries, adnexal regions, and pelvic cul-de-sac. It was necessary to proceed with endovaginal exam following the transabdominal exam to visualize the uterus and endometrium, ovaries. COMPARISON:  CT of the abdomen and pelvis on 07/31/2017 and MRI of the pelvis on 04/25/2014 FINDINGS: Uterus Measurements: 8.2 x 5.5 x 4.6 centimeters. There is a large LOWER uterine segment fibroid replacing the normal architecture LOWER uterus. Fibroid measures 4.8 x 5.2 centimeters, larger when compared to prior study. The more normal fundal portion of the uterus is not well seen sonographically. Endometrium Thickness: Not visualized. Right ovary Measurements: The  ovary is not visualized, either absent or obscured. Left ovary Measurements: The ovary is not visualized, either absent or obscured. Other findings Trace free pelvic fluid noted. Study quality is degraded by overlying bowel gas and full bladder. IMPRESSION: 1. Ultrasound is limited in further characterization of the pelvic abnormalities. Large LOWER uterine segment fibroid measures at least 5.2 centimeters and replaces the normal anatomy of the LOWER uterus. I suspect a large calcified fundal fibroid which is not imaged on the ultrasound. 2. Nonvisualized endometrium. 3. The ovaries are not imaged. The small amount of fluid seen in the region of the RIGHT ovary is not imaged sonographically. Electronically Signed   By: Nolon Nations M.D.   On: 07/31/2017 17:36        Scheduled Meds: . amLODipine  10 mg Oral Daily  . enoxaparin (LOVENOX) injection  40 mg Subcutaneous QHS  . hydroxychloroquine  200 mg Oral Daily  . predniSONE  10 mg Oral Q breakfast  . pregabalin  150 mg Oral BID  . traZODone  50 mg Oral Daily   Continuous Infusions: . 0.45 % NaCl with KCl 20 mEq / L 75 mL/hr at 08/01/17 0048  . cefTRIAXone (ROCEPHIN)  IV Stopped (08/01/17 0023)     LOS: 0 days     Georgette Shell, MD [facility] If 7PM-7AM, please contact night-coverage [email or web address] Password [facility] 08/01/2017, 9:41 AM

## 2017-08-02 ENCOUNTER — Encounter (HOSPITAL_COMMUNITY): Payer: Self-pay | Admitting: Surgery

## 2017-08-02 ENCOUNTER — Observation Stay (HOSPITAL_COMMUNITY): Payer: Medicare HMO

## 2017-08-02 DIAGNOSIS — Z8719 Personal history of other diseases of the digestive system: Secondary | ICD-10-CM

## 2017-08-02 DIAGNOSIS — R112 Nausea with vomiting, unspecified: Secondary | ICD-10-CM | POA: Diagnosis not present

## 2017-08-02 DIAGNOSIS — Z8614 Personal history of Methicillin resistant Staphylococcus aureus infection: Secondary | ICD-10-CM

## 2017-08-02 HISTORY — DX: Personal history of other diseases of the digestive system: Z87.19

## 2017-08-02 LAB — CBC
HEMATOCRIT: 24 % — AB (ref 36.0–46.0)
HEMOGLOBIN: 7.3 g/dL — AB (ref 12.0–15.0)
MCH: 22.4 pg — AB (ref 26.0–34.0)
MCHC: 30.4 g/dL (ref 30.0–36.0)
MCV: 73.6 fL — AB (ref 78.0–100.0)
Platelets: 255 10*3/uL (ref 150–400)
RBC: 3.26 MIL/uL — ABNORMAL LOW (ref 3.87–5.11)
RDW: 18.2 % — ABNORMAL HIGH (ref 11.5–15.5)
WBC: 9.4 10*3/uL (ref 4.0–10.5)

## 2017-08-02 LAB — BASIC METABOLIC PANEL
Anion gap: 6 (ref 5–15)
BUN: 13 mg/dL (ref 6–20)
CO2: 18 mmol/L — AB (ref 22–32)
CREATININE: 1.49 mg/dL — AB (ref 0.44–1.00)
Calcium: 7.2 mg/dL — ABNORMAL LOW (ref 8.9–10.3)
Chloride: 117 mmol/L — ABNORMAL HIGH (ref 101–111)
GFR calc Af Amer: 43 mL/min — ABNORMAL LOW (ref >60)
GFR calc non Af Amer: 37 mL/min — ABNORMAL LOW (ref >60)
GLUCOSE: 80 mg/dL (ref 65–99)
Potassium: 4 mmol/L (ref 3.5–5.1)
Sodium: 141 mmol/L (ref 135–145)

## 2017-08-02 MED ORDER — LIDOCAINE-EPINEPHRINE (PF) 2 %-1:200000 IJ SOLN
INTRAMUSCULAR | Status: AC
  Administered 2017-08-02: 12:00:00
  Filled 2017-08-02: qty 20

## 2017-08-02 MED ORDER — SODIUM CHLORIDE 0.9 % IV SOLN
INTRAVENOUS | Status: DC
  Administered 2017-08-02 (×2): via INTRAVENOUS

## 2017-08-02 NOTE — Evaluation (Signed)
Physical Therapy 1X Evaluation Patient Details Name: Carolyn Zamora MRN: [id number] DOB: Jun 13, 1955 Today's Date: 08/02/2017   History of Present Illness  Carolyn Zamora is a 62 y.o. female with medical history significant for lupus, HTN, CKD IV and kidney cancer s/p left nephrectomy, splenectomy admitted 6/13 19 with urosepsis, and discharged, however readmitted 6/21 due to nausea and vomiting   Clinical Impression  Pt was at a MOD I level using RW in room with no assist necessary. Pt stated she feels fine so far os mobility and feels she will get back to home normal and has help. No further PT needs necessary and pt agrees. Will sign off.     Follow Up Recommendations No PT follow up(pt currently declines SNF)    Equipment Recommendations  None recommended by PT    Recommendations for Other Services       Precautions / Restrictions Restrictions Weight Bearing Restrictions: No      Mobility  Bed Mobility Overal bed mobility: Modified Independent                Transfers Overall transfer level: Modified independent Equipment used: Rolling walker (2 wheeled) Transfers: Sit to/from Stand Sit to Stand: Modified independent (Device/Increase time)            Ambulation/Gait Ambulation/Gait assistance: Modified independent (Device/Increase time) Gait Distance (Feet): 10 Feet(to the bathroom and abck ) Assistive device: Rolling walker (2 wheeled) Gait Pattern/deviations: Step-through pattern        Stairs            Wheelchair Mobility    Modified Rankin (Stroke Patients Only)       Balance Overall balance assessment: Modified Independent Sitting-balance support: Single extremity supported Sitting balance-Leahy Scale: Normal     Standing balance support: Bilateral upper extremity supported(using rW ) Standing balance-Leahy Scale: Good                               Pertinent Vitals/Pain Pain Score: 6  Pain Location: L toe,  because I have gout and it is getting better, but still hurts  Pain Descriptors / Indicators: Discomfort    Home Living Family/patient expects to be discharged to:: Private residence Living Arrangements: Children Available Help at Discharge: Available PRN/intermittently;Available 24 hours/day(pt states she is never alone. ) Type of Home: Apartment Home Access: Level entry     Home Layout: One level Home Equipment: Walker - 4 wheels;Shower seat;Wheelchair - manual;Bedside commode Additional Comments: brother stops by daily, daughter works, family helps when she needs it     Prior Function Level of Independence: Independent with assistive device(s)         Comments: reports had been drivingand using RW to walk in home      Hand Dominance        Extremity/Trunk Assessment        Lower Extremity Assessment Lower Extremity Assessment: Overall WFL for tasks assessed       Communication   Communication: No difficulties  Cognition Arousal/Alertness: Awake/alert Behavior During Therapy: WFL for tasks assessed/performed Overall Cognitive Status: Within Functional Limits for tasks assessed                                        General Comments      Exercises     Assessment/Plan    PT Assessment Patent  does not need any further PT services  PT Problem List Pain       PT Treatment Interventions      PT Goals (Current goals can be found in the Care Plan section)  Acute Rehab PT Goals Patient Stated Goal: " i can wlak and I am doing fine, I like to be independent" PT Goal Formulation: All assessment and education complete, DC therapy    Frequency     Barriers to discharge        Co-evaluation               AM-PAC PT "6 Clicks" Daily Activity  Outcome Measure Difficulty turning over in bed (including adjusting bedclothes, sheets and blankets)?: None Difficulty moving from lying on back to sitting on the side of the bed? :  None Difficulty sitting down on and standing up from a chair with arms (e.g., wheelchair, bedside commode, etc,.)?: None Help needed moving to and from a bed to chair (including a wheelchair)?: None Help needed walking in hospital room?: None Help needed climbing 3-5 steps with a railing? : A Little 6 Click Score: 23    End of Session Equipment Utilized During Treatment: Gait belt   Patient left: in bed;with call bell/phone within reach Nurse Communication: Mobility status      Time: 1400-1413 PT Time Calculation (min) (ACUTE ONLY): 13 min   Charges:   PT Evaluation $PT Eval Low Complexity: 1 Low     PT G CodesClide Zamora, PT Pager: [phone] 08/02/2017   Carolyn Zamora, Carolyn Zamora 08/02/2017, 4:42 PM

## 2017-08-02 NOTE — Progress Notes (Signed)
Initial Nutrition Assessment  DOCUMENTATION CODES:   Obesity unspecified  INTERVENTION:   - Magic cup BID with meals, each supplement provides 290 kcal and 9 grams of protein  NUTRITION DIAGNOSIS:   Inadequate oral intake related to nausea, vomiting as evidenced by per patient/family report.  GOAL:   Patient will meet greater than or equal to 90% of their needs  MONITOR:   PO intake, Supplement acceptance, Diet advancement, Labs, I & O's, Weight trends  REASON FOR ASSESSMENT:   Malnutrition Screening Tool    ASSESSMENT:   62 year old female who presented to the ED with generalized abdominal pain and N/V/D. Pt recently discharged after being admitted for sepsis due to UTI. PMH significant for kidney cancer s/p left nephrectomy splenectomy, chronic kidney disease stage IV, lupus, and hypertension.  Spoke with pt at bedside. Per IR, pt is s/p US guided aspiration of 30 cc of serous fluid from chronic fluid collection within the subcutaneous tissues of the ventral left mid hemiabdomen.  Pt states that she "just had fluid drained and pain medicine." Pt with 75% completed full liquid meal tray in room at time of visit. Pt had consumed 100% of 3 [place] ice pops and ginger ale and 0% of cream of mushroom soup. Pt agreeable to trying Magic Cup to maximize protein intake during hospitalization. Pt does not like Ensure.  When asked how she was eating, pt states, "I'm eating like I'm supposed to." Pt reports eating "2 full course meals" daily and snacking in between. Pt endorse cooking meals and eating out.  Pt endorses weight loss, stating that her highest weight was 269 lbs when she was on prednisone that made her "swell up all over." Pt reports losing 100 lbs due to "sickness" and having an infection in 2 teeth. Pt reports having surgery and "not tolerating" any food afterwards. Pt states that she is usually a size 7 and weighs 140 lbs. Pt reports that her current weight is 164 lbs.  Weight history in chart is limited as the most recent weights appear to be stated rather than measured. Pt weighed 201 lbs on 12/27/15.  Medications reviewed.  Labs reviewed: CO2 18 (L), creatinine 1.49 (H), calcium 7.2 (L), magnesium 0.9 (L), hemoglobin 7.3 (L), HCT 24.0 (L)  NUTRITION - FOCUSED PHYSICAL EXAM:    Most Recent Value  Orbital Region  No depletion  Upper Arm Region  No depletion  Thoracic and Lumbar Region  No depletion  Buccal Region  No depletion  Temple Region  Mild depletion  Clavicle Bone Region  No depletion  Clavicle and Acromion Bone Region  Mild depletion  Scapular Bone Region  Unable to assess  Dorsal Hand  Mild depletion  Patellar Region  Mild depletion  Anterior Thigh Region  Mild depletion  Posterior Calf Region  Mild depletion  Edema (RD Assessment)  None  Hair  Reviewed  Eyes  Reviewed  Mouth  Reviewed [pt missing several teeth]  Skin  Reviewed  Nails  Reviewed       Diet Order:   Diet Order           Diet full liquid Room service appropriate? Yes; Fluid consistency: Thin  Diet effective now          EDUCATION NEEDS:   No education needs have been identified at this time  Skin:  Skin Assessment: Reviewed RN Assessment  Last BM:  07/31/17  Height:   Ht Readings from Last 1 Encounters:  07/31/17 5\' 2"  (1.575 m)  Weight:   Wt Readings from Last 1 Encounters:  07/31/17 175 lb 11.3 oz (79.7 kg)    Ideal Body Weight:  50 kg  BMI:  Body mass index is 32.14 kg/m.  Estimated Nutritional Needs:   Kcal:  1500-1700 kcal/day  Protein:  80-95 grams/day  Fluid:  >/= 1.7 L/day    Gaynell Face, MS, RD, LDN Pager: [phone] Weekend/After Hours: [phone]

## 2017-08-02 NOTE — Progress Notes (Signed)
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  Patientjustdischarged from the hospital6/13-6/19/19-managed for sepsis secondary to Klebsiella pneumonia bacteremia and UTI.Discharged home on cephalexin.Reported improvements clinically untilyesterday.  ED Course:Afebrile,systolic blood pressure 366Y to 180s.WBC-elevated 15.7.K low 3.1.Sodium- 147.Creatinine-improved compared to prior 1.59.lipase-30.CT abdomen and pelvis wocontrast-new small volume free fluid in pelvis, ?Gynecologic in etiology,pelvic ultrasound if referrablesymptoms,small left abdominal wall fluid collection associated with fascial defect(see detailed report),bilateral femoral AVN.Subsequenttransvaginal and transabdominal pelvicUs-uterine fibroid.Small amount of fluid in the region of right ovary.Patient was given 2 L normal saline bolus.Hospitalist was called [facility].    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 2] status post Klebsiella bacteremia UTI repeat UA last night is clear.  But upon admission her white count was 15.7 and it has come down to 9.4 after starting the Rocephin.  I would give a total of 4 weeks and then DC the Rocephin.  At this time patient did not have any urinary symptoms of frequency urgency or burning  3] severe hypomagnesemia with magnesium of 0.9/hypokalemia K3.5 -replete potassium and magnesium IV.  4] CKD stage IV stable.  5] history of lupus patient on hydroxyzine, prednisone.  Restart mycophenolate.  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  7] AV necrosis of bilateral hip patient reports pain in the left hip-which was not her presenting complaint at this time.  However CT scan of the abdomen pelvis done for work-up shows New small volume free fluid in pelvis, considering distribution this may gynecologic in etiology. Consider pelvic ultrasound ifthere are referable symptoms.  Similar LEFT anterior abdominal wall fluid collection associated with fascial defect, possibly related to herniorrhaphy. Status post LEFT nephrectomy and splenectomy. Stable grade 2 L4-5 anterolisthesis, severe L4-5 neural foraminal narrowing. Bilateral femoral head AVN, bilateral femoral head avascular necrosis with slight collapse on the RIGHT. New small pleural effusions. She will need outpatient follow up with ORTHO. . 8]large fibroids -patient is aware that she has fibroids and she has f/u with GYN scheduled this week.  9]ANEMIA with a drop in hemoglobin from 10 to 7 with anterior abdominal wall fluid collection?  Hematoma?  Spontaneous bleed?  Lovenox however DC Lovenox for now and put her on SCD boots  For DVT prophylaxis.  Discussed with Dr. Barry Dienes who was kind enough to see the patient today.  But will monitor  H&H.         DVT prophylaxis:LOVENOX Code Status: FULL Family Communication:DW SIS Disposition Plan:PLAN DC IN AM Consultants:  NONE  Procedures:NONE Antimicrobials:ROCEPHIN  Subjective:NO NAUSEA VOMITING OR DIARRHEA SINCE ADMIT   Objective: Vitals:   08/01/17 0515 08/01/17 1424 08/01/17 2210 08/02/17 0439  BP: 136/70 125/65 90/73 (!) 145/75  Pulse: 87 86 88 90  Resp: 20 20 20 20   Temp: 98.2 F (36.8 C) 98.2 F (36.8 C) 98.4 F (36.9 C) 98.2 F (36.8 C)  TempSrc: Oral Oral Oral Oral  SpO2: 100% 100% 100% 100%  Weight:      Height:        Intake/Output Summary (Last 24 hours) at 08/02/2017 0950 Last data filed at 08/02/2017 0600 Gross per 24 hour  Intake 1318.33 ml  Output 200 ml  Net 1118.33 ml   Filed Weights   07/31/17 1303 07/31/17 2207  Weight: 74.8 kg (165 lb) 79.7 kg (175 lb 11.3 oz)    Examination:  General exam: Appears calm and comfortable  Respiratory system: Clear to auscultation. Respiratory effort normal. Cardiovascular system: S1 & S2 heard, RRR. No JVD, murmurs, rubs, gallops or clicks. No pedal edema. Gastrointestinal system: Abdomen is nondistended, soft and  generalized tender. No organomegaly or masses felt. Normal bowel sounds heard. Central nervous system: Alert and oriented. No focal neurological deficits. Extremities: Symmetric 5 x 5 power. Skin: No rashes, lesions or ulcers Psychiatry: Judgement and insight appear normal. Mood & affect appropriate.     Data Reviewed: I have personally reviewed following labs and imaging studies  CBC: Recent Labs  Lab 07/27/17 0359 07/28/17 0454 07/29/17 0442 07/31/17 1405 08/01/17 0713 08/02/17 0435  WBC 8.2 14.8* 15.5* 15.7* 9.4 9.4  NEUTROABS 6.1 12.5* 13.0*  --   --   --   HGB 8.8* 10.0* 8.3* 10.1* 7.3* 7.3*  HCT 28.7* 32.8* 26.1* 30.1* 23.8* 24.0*  MCV 71.4* 71.6* 69.8* 67.3* 72.1* 73.6*  PLT 104* 205 188 315 241 465   Basic Metabolic Panel: Recent Labs  Lab 07/28/17 0454  07/28/17 0719 07/29/17 0442 07/31/17 1405 08/01/17 0713 08/02/17 0435  NA 146*  --  146* 147* 145 141  K 6.6* 5.4* 4.1 3.1* 3.5 4.0  CL 121*  --  119* 116* 118* 117*  CO2 17*  --  19* 19* 21* 18*  GLUCOSE 100*  --  91 83 64* 80  BUN 24*  --  25* 19 15 13   CREATININE 1.76*  --  1.74* 1.59* 1.50* 1.49*  CALCIUM 8.0*  --  7.5* 8.4* 7.0* 7.2*  MG  --   --   --   --  0.9*  --    GFR: Estimated Creatinine Clearance: 39.2 mL/min (A) (by C-G formula based on SCr of 1.49 mg/dL (H)). Liver Function Tests: Recent Labs  Lab 07/31/17 1405  AST 39  ALT 50  ALKPHOS 84  BILITOT 0.5  PROT 6.7  ALBUMIN 3.4*   Recent Labs  Lab 07/31/17 1405  LIPASE 30   No results for input(s): AMMONIA in the last 168 hours. Coagulation Profile: No results for input(s): INR, PROTIME in the last 168 hours. Cardiac Enzymes: No results for input(s): CKTOTAL, CKMB, CKMBINDEX, TROPONINI in the last 168 hours. BNP (last 3 results) No results for input(s): PROBNP in the last 8760 hours. HbA1C: No results for input(s): HGBA1C in the last 72 hours. CBG: No results for input(s): GLUCAP in the last 168 hours. Lipid Profile: No results for input(s): CHOL, HDL, LDLCALC, TRIG, CHOLHDL, LDLDIRECT in the last 72 hours. Thyroid Function Tests: No results for input(s): TSH, T4TOTAL, FREET4, T3FREE, THYROIDAB in the last 72 hours. Anemia Panel: No  results for input(s): VITAMINB12, FOLATE, FERRITIN, TIBC, IRON, RETICCTPCT in the last 72 hours. Sepsis Labs: No results for input(s): PROCALCITON, LATICACIDVEN in the last 168 hours.  Recent Results (from the past 240 hour(s))  Culture, blood (Routine x 2)     Status: Abnormal   Collection Time: 07/23/17  1:00 PM  Result Value Ref Range Status   Specimen Description   Final    BLOOD LEFT ANTECUBITAL Performed at [facility], [address] [place], [place] [address]    Special Requests   Final    BOTTLES DRAWN AEROBIC AND ANAEROBIC Blood Culture  adequate volume Performed at [facility], [address] [place], [place] [address]    Culture  Setup Time   Final    IN BOTH AEROBIC AND ANAEROBIC BOTTLES GRAM NEGATIVE RODS CRITICAL RESULT CALLED TO, READ BACK BY AND VERIFIED WITH: B GREEN PHARMD 07/24/17 0549 JDW Performed at [facility], [address] [place], [place] [address]    Culture KLEBSIELLA PNEUMONIAE (A)  Final   Report Status 07/26/2017 FINAL  Final   Organism ID, Bacteria KLEBSIELLA PNEUMONIAE  Final      Susceptibility   Klebsiella pneumoniae - MIC*    AMPICILLIN >=32 RESISTANT Resistant     CEFAZOLIN <=4 SENSITIVE Sensitive     CEFEPIME <=1 SENSITIVE Sensitive     CEFTAZIDIME <=1 SENSITIVE Sensitive     CEFTRIAXONE <=1 SENSITIVE Sensitive     CIPROFLOXACIN <=0.25 SENSITIVE Sensitive     GENTAMICIN <=1 SENSITIVE Sensitive     IMIPENEM <=0.25 SENSITIVE Sensitive     TRIMETH/SULFA <=20 SENSITIVE Sensitive     AMPICILLIN/SULBACTAM 8 SENSITIVE Sensitive     PIP/TAZO <=4 SENSITIVE Sensitive     Extended ESBL NEGATIVE Sensitive     * KLEBSIELLA PNEUMONIAE  Blood Culture ID Panel (Reflexed)     Status: Abnormal   Collection Time: 07/23/17  1:00 PM  Result Value Ref Range Status   Enterococcus species NOT DETECTED NOT DETECTED Final   Listeria monocytogenes NOT DETECTED NOT DETECTED Final   Staphylococcus species NOT DETECTED NOT DETECTED Final   Staphylococcus aureus NOT DETECTED NOT DETECTED Final   Streptococcus species NOT DETECTED NOT DETECTED Final   Streptococcus agalactiae NOT DETECTED NOT DETECTED Final   Streptococcus pneumoniae NOT DETECTED NOT DETECTED Final   Streptococcus pyogenes NOT DETECTED NOT DETECTED Final   Acinetobacter baumannii NOT DETECTED NOT DETECTED Final   Enterobacteriaceae species DETECTED (A) NOT DETECTED Final    Comment: Enterobacteriaceae represent a large family of gram-negative bacteria, not a single organism. CRITICAL RESULT CALLED TO, READ BACK BY AND  VERIFIED WITH: B GREEN PHARMD 07/24/17 0549 JDW    Enterobacter cloacae complex NOT DETECTED NOT DETECTED Final   Escherichia coli NOT DETECTED NOT DETECTED Final   Klebsiella oxytoca NOT DETECTED NOT DETECTED Final   Klebsiella pneumoniae DETECTED (A) NOT DETECTED Final    Comment: CRITICAL RESULT CALLED TO, READ BACK BY AND VERIFIED WITH: B GREEN PHARMD 07/24/17 0549 JDW    Proteus species NOT DETECTED NOT DETECTED Final   Serratia marcescens NOT DETECTED NOT DETECTED Final   Carbapenem resistance NOT DETECTED NOT DETECTED Final   Haemophilus influenzae NOT DETECTED NOT DETECTED Final   Neisseria meningitidis NOT DETECTED NOT DETECTED Final   Pseudomonas aeruginosa NOT DETECTED NOT DETECTED Final   Candida albicans NOT DETECTED NOT DETECTED Final   Candida glabrata NOT DETECTED NOT DETECTED Final   Candida krusei NOT DETECTED  NOT DETECTED Final   Candida parapsilosis NOT DETECTED NOT DETECTED Final   Candida tropicalis NOT DETECTED NOT DETECTED Final    Comment: Performed at [facility], [place] [address] [place], [place] [address]  MRSA PCR Screening     Status: None   Collection Time: 07/23/17 11:03 PM  Result Value Ref Range Status   MRSA by PCR NEGATIVE NEGATIVE Final    Comment:        The GeneXpert MRSA Assay (FDA approved for NASAL specimens only), is one component of a comprehensive MRSA colonization surveillance program. It is not intended to diagnose MRSA infection nor to guide or monitor treatment for MRSA infections. Performed at [facility], [place] [address] [place], [place] [address]   Urine Culture     Status: None   Collection Time: 07/24/17  9:17 AM  Result Value Ref Range Status   Specimen Description   Final    URINE, CLEAN CATCH Performed at [facility], [place] [address], [place], [place] [address]    Special Requests   Final    NONE Performed at [facility], [place] [address]  [place], [place] [address]    Culture   Final    NO GROWTH Performed at [facility], [place] [address], [place], [place] [address]    Report Status 07/25/2017 FINAL  Final  Culture, blood (routine x 2)     Status: None   Collection Time: 07/25/17  4:50 AM  Result Value Ref Range Status   Specimen Description   Final    BLOOD LEFT WRIST Performed at [place] [address] [place], [place] [address]    Special Requests   Final    BOTTLES DRAWN AEROBIC ONLY Blood Culture results may not be optimal due to an inadequate volume of blood received in culture bottles Performed at [place] [address], [place], [place] [address]    Culture   Final    NO GROWTH 5 DAYS Performed at [facility], [place] [address], [place], [place] [address]    Report Status 07/30/2017 FINAL  Final  Culture, blood (routine x 2)     Status: None   Collection Time: 07/25/17  4:50 AM  Result Value Ref Range Status   Specimen Description BLOOD LEFT HAND  Final   Special Requests   Final    BOTTLES DRAWN AEROBIC ONLY Blood Culture results may not be optimal due to an inadequate volume of blood received in culture bottles Performed at [facility], [place] [address] [place], [place] [address]    Culture   Final    NO GROWTH 5 DAYS Performed at [facility], [place] [address], [place], [place] [address]    Report Status 07/30/2017 FINAL  Final  Urine culture     Status: Abnormal   Collection Time: 07/31/17  2:05 PM  Result Value Ref Range Status   Specimen Description   Final    URINE, CLEAN CATCH Performed at [facility], [address] [place],  [address]    Special Requests   Final    Normal Performed at [facility], [address] [place], [place] [address]    Culture (A)  Final    <10,000 COLONIES/mL INSIGNIFICANT GROWTH Performed at [facility], [place] [address], [place],   [address]    Report Status 08/01/2017  FINAL  Final         Radiology Studies: Ct Abdomen Pelvis Wo Contrast  Result Date: 07/31/2017 CLINICAL DATA:  Diffuse abdominal pain for 1 day, distension, nausea and vomiting. Assess high-grade bowel obstruction. History of splenectomy, chronic kidney disease, lupus, renal cancer. EXAM: CT ABDOMEN AND PELVIS WITHOUT CONTRAST TECHNIQUE: Multidetector CT imaging of the abdomen and pelvis was performed following the standard protocol without IV contrast. COMPARISON:  CT abdomen and pelvis May 08, 2017 FINDINGS: LOWER CHEST: New small pleural effusions. Heart size is normal. No pericardial effusion. Stable nodular scarring LEFT lung base. HEPATOBILIARY: Normal. PANCREAS: Normal.  Tail is prolapsed in the renal fossa. SPLEEN: Spondylosis.  Prolapsed into renal fossa. ADRENALS/URINARY TRACT: Status post LEFT nephrectomy. No nephrolithiasis, hydronephrosis; limited assessment for renal masses by nonenhanced CT. The unopacified ureters are normal in course and caliber. Urinary bladder is partially distended and unremarkable. Normal adrenal glands. STOMACH/BOWEL: The stomach, small and large bowel are normal in course and caliber without inflammatory changes, sensitivity decreased by lack of enteric contrast. Normal appendix. VASCULAR/LYMPHATIC: Aortoiliac vessels are normal in course and caliber. Mild calcific atherosclerosis. No lymphadenopathy by CT size criteria. REPRODUCTIVE: Similar mildly enlarged dense uterus compatible with leiomyoma with subserosal 3.3 cm calcified leiomyoma, less likely calcified adnexa. OTHER: Small volume free fluid in pelvis, and about the RIGHT presumed subserosal leiomyoma. No intraperitoneal free air. MUSCULOSKELETAL: 10 x 2.3 cm fluid collection LEFT anterior abdominal wall subcutaneous fat with fascial defect. Bilateral femoral head avascular necrosis, slight collapse on the RIGHT. Grade 2 L4-5 anterolisthesis, chronic RIGHT L5 for pars  interarticularis defects with similar severe collapse. Stable grade 1 L5-S1 retrolisthesis with vacuum disc. Severe L4-5 neural foraminal narrowing. IMPRESSION: 1. New small volume free fluid in pelvis, considering distribution this may gynecologic in etiology. Consider pelvic ultrasound if there are referable symptoms. 2. Similar LEFT anterior abdominal wall fluid collection associated with fascial defect, possibly related to herniorrhaphy. Status post LEFT nephrectomy and splenectomy. 3. Stable grade 2 L4-5 anterolisthesis, severe L4-5 neural foraminal narrowing. 4. Bilateral femoral head AVN, bilateral femoral head avascular necrosis with slight collapse on the RIGHT. 5. New small pleural effusions. Aortic Atherosclerosis (ICD10-I70.0). Electronically Signed   By: Elon Alas M.D.   On: 07/31/2017 15:58   Dg Chest 2 View  Result Date: 07/31/2017 CLINICAL DATA:  Nausea, vomiting. Abdominal pain. Cough and weakness. EXAM: CHEST - 2 VIEW COMPARISON:  07/23/2017 FINDINGS: Heart size is accentuated by the AP technique. There is mild prominence of interstitial markings of the lungs, increased since the prior study. More focal opacity at the LEFT lung base may represent atelectasis or infiltrate. IMPRESSION: Increased prominence of interstitial markings, raising the question viral infection. Question early infiltrate versus atelectasis in the LEFT lung base. Electronically Signed   By: Nolon Nations M.D.   On: 07/31/2017 17:38   US Transvaginal Non-ob  Result Date: 07/31/2017 CLINICAL DATA:  Free fluid identified on CT exam. Generalized abdominal pain. Nausea and vomiting for 2 days. Recently hospitalized urinary sepsis. Patient is postmenopausal. EXAM: TRANSABDOMINAL AND TRANSVAGINAL ULTRASOUND OF PELVIS TECHNIQUE: Both transabdominal and transvaginal ultrasound examinations of the pelvis were performed. Transabdominal technique was performed for global imaging of the pelvis including uterus, ovaries,  adnexal regions, and pelvic cul-de-sac. It was necessary to proceed with endovaginal exam following the transabdominal exam to visualize the uterus and endometrium, ovaries. COMPARISON:  CT of the abdomen and pelvis on 07/31/2017 and MRI of the pelvis on 04/25/2014 FINDINGS: Uterus Measurements: 8.2 x  5.5 x 4.6 centimeters. There is a large LOWER uterine segment fibroid replacing the normal architecture LOWER uterus. Fibroid measures 4.8 x 5.2 centimeters, larger when compared to prior study. The more normal fundal portion of the uterus is not well seen sonographically. Endometrium Thickness: Not visualized. Right ovary Measurements: The ovary is not visualized, either absent or obscured. Left ovary Measurements: The ovary is not visualized, either absent or obscured. Other findings Trace free pelvic fluid noted. Study quality is degraded by overlying bowel gas and full bladder. IMPRESSION: 1. Ultrasound is limited in further characterization of the pelvic abnormalities. Large LOWER uterine segment fibroid measures at least 5.2 centimeters and replaces the normal anatomy of the LOWER uterus. I suspect a large calcified fundal fibroid which is not imaged on the ultrasound. 2. Nonvisualized endometrium. 3. The ovaries are not imaged. The small amount of fluid seen in the region of the RIGHT ovary is not imaged sonographically. Electronically Signed   By: Nolon Nations M.D.   On: 07/31/2017 17:36   US Pelvis Complete  Result Date: 07/31/2017 CLINICAL DATA:  Free fluid identified on CT exam. Generalized abdominal pain. Nausea and vomiting for 2 days. Recently hospitalized urinary sepsis. Patient is postmenopausal. EXAM: TRANSABDOMINAL AND TRANSVAGINAL ULTRASOUND OF PELVIS TECHNIQUE: Both transabdominal and transvaginal ultrasound examinations of the pelvis were performed. Transabdominal technique was performed for global imaging of the pelvis including uterus, ovaries, adnexal regions, and pelvic cul-de-sac. It  was necessary to proceed with endovaginal exam following the transabdominal exam to visualize the uterus and endometrium, ovaries. COMPARISON:  CT of the abdomen and pelvis on 07/31/2017 and MRI of the pelvis on 04/25/2014 FINDINGS: Uterus Measurements: 8.2 x 5.5 x 4.6 centimeters. There is a large LOWER uterine segment fibroid replacing the normal architecture LOWER uterus. Fibroid measures 4.8 x 5.2 centimeters, larger when compared to prior study. The more normal fundal portion of the uterus is not well seen sonographically. Endometrium Thickness: Not visualized. Right ovary Measurements: The ovary is not visualized, either absent or obscured. Left ovary Measurements: The ovary is not visualized, either absent or obscured. Other findings Trace free pelvic fluid noted. Study quality is degraded by overlying bowel gas and full bladder. IMPRESSION: 1. Ultrasound is limited in further characterization of the pelvic abnormalities. Large LOWER uterine segment fibroid measures at least 5.2 centimeters and replaces the normal anatomy of the LOWER uterus. I suspect a large calcified fundal fibroid which is not imaged on the ultrasound. 2. Nonvisualized endometrium. 3. The ovaries are not imaged. The small amount of fluid seen in the region of the RIGHT ovary is not imaged sonographically. Electronically Signed   By: Nolon Nations M.D.   On: 07/31/2017 17:36        Scheduled Meds: . amLODipine  10 mg Oral Daily  . hydroxychloroquine  200 mg Oral Daily  . predniSONE  10 mg Oral Q breakfast  . pregabalin  150 mg Oral BID  . traZODone  50 mg Oral Daily   Continuous Infusions: . sodium chloride    . cefTRIAXone (ROCEPHIN)  IV Stopped (08/01/17 2219)     LOS: 0 days     Georgette Shell, MD [facility] If 7PM-7AM, please contact night-coverage [email or web address] Password [facility] 08/02/2017, 9:50 AM

## 2017-08-02 NOTE — Procedures (Signed)
Pre Procedure Dx: Abdominal wall fluid collection Post Procedural Dx: Same  Technically successful US guided aspiration of approximately 30 cc of serous fluid from chronic fluid collection within the subcutaneous tissues of the ventral left mid hemiabdomen.   EBL: None  No immediate complications.   Ronny Bacon, MD Pager #: [phone]

## 2017-08-02 NOTE — Progress Notes (Signed)
Carolyn Zamora [id number] September 02, 1955  CARE TEAM:  PCP: Beckie Salts, MD  Outpatient Care Team: Patient Care Team: Beckie Salts, MD as PCP - General (Internal Medicine) Wilhemena Durie, MD as Consulting Physician (Surgery) Starling Manns, MD as Consulting Physician (Orthopedic Surgery)  Inpatient Treatment Team: Treatment Team: Attending Provider: Georgette Shell, MD; Registered Nurse: Georgianne Fick, RN; Rounding Team: Joycelyn Das, MD; Registered Nurse: Benjamine Mola, RN; Technician: Lucas Mallow, NT; Rounding Team: Edison Pace, Md, MD; Technician: Ardelia Mems, [place]   Problem List:   Principal Problem:   Intractable vomiting Active Problems:   Hypertension   Systemic lupus erythematosus ([place])   Chronic kidney disease, stage III (moderate) ([place])   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   AVN (avascular necrosis of bone) ([place])   Immunocompromised state ([place])   Chronic pain syndrome   Renal cell carcinoma of left kidney s/p nephrectomy   History of dental abscess with sepsis 2017   History of methicillin resistant staphylococcus aureus (MRSA)      * No surgery found [facility] Stay = 0 days  Assessment  Abdominal pain near chronic seroma from prior open incisional hernia repair at outside hospital  Plan:  -She has a chronic abdominal fluid collection that most likely is a chronic seroma cavity from her incisional hernia that was repaired by Dr. Radford Pax down in [facility].  It does not seem to have radically changed in size and 18 months.  However she does have an history of MRSA infections and does have pain there.  It is reasonable to sample the fluid to make sure that there is no evidence of any infection or other abnormality.  It seems rather simple so most likely can do under ultrasound guidance.  See if interventional radiology can do it today versus tomorrow.  Perhaps leave the drain if abnormal.  Sent for culture.   If abnormal, most likely transition to outpatient care and follow-up with original surgeon to see if needs mesh removal and intervention.  If culture negative, reassuring it is a chronic seroma cavity that is not the source of the problem.  No evidence of any bowel obstruction perforation or other major abnormality. -VTE prophylaxis- SCDs, etc -mobilize as tolerated to help recovery  30 minutes spent in review, evaluation, examination, counseling, and coordination of care.  More than 50% of that time was spent in counseling.  Adin Hector, MD, FACS, MASCRS Gastrointestinal and Minimally Invasive Surgery    [address], [place], [place] [address] [phone] Main / Paging [phone] Fax   08/02/2017    Subjective: (Chief complaint)  Tolerating liquids.  Not throwing up today.  Still with some abdominal soreness  Objective:  Vital signs:  Vitals:   08/01/17 0515 08/01/17 1424 08/01/17 2210 08/02/17 0439  BP: 136/70 125/65 90/73 (!) 145/75  Pulse: 87 86 88 90  Resp: '20 20 20 20  ' Temp: 98.2 F (36.8 C) 98.2 F (36.8 C) 98.4 F (36.9 C) 98.2 F (36.8 C)  TempSrc: Oral Oral Oral Oral  SpO2: 100% 100% 100% 100%  Weight:      Height:        Last BM Date: 07/31/17  Intake/Output   Yesterday:  06/22 0701 - 06/23 0700 In: 1318.3 [P.O.:480; I.V.:641.7; IV Piggyback:196.7] Out: 200 [Urine:200] This shift:  No intake/output data recorded.  Bowel function:  Flatus: YES  BM:  YES  Drain: (No  drain)   Physical Exam:  General: Pt awake/alert/oriented x4 in no acute distress Eyes: PERRL, normal EOM.  Sclera clear.  No icterus Neuro: CN II-XII intact w/o focal sensory/motor deficits. Lymph: No head/neck/groin lymphadenopathy Psych:  No delerium/psychosis/paranoia HENT: Normocephalic, Mucus membranes moist.  No thrush Neck: Supple, No tracheal deviation Chest: No chest wall pain w good excursion CV:  Pulses intact.  Regular rhythm MS:  Normal AROM mjr joints.  No obvious deformity  Abdomen: Soft.  Nondistended.  Tenderness at left side of abdomen, mild.  No erythema.  No guarding..  No evidence of peritonitis.  No incarcerated hernias.  Ext:  No deformity.  No mjr edema.  No cyanosis Skin: No petechiae / purpura  Results:   Labs: Results for orders placed or performed during the hospital encounter of 07/31/17 (from the past 48 hour(s))  Lipase, blood     Status: None   Collection Time: 07/31/17  2:05 PM  Result Value Ref Range   Lipase 30 11 - 51 U/L    Comment: Performed at [facility], [address] [place], [place] [address]  Comprehensive metabolic panel     Status: Abnormal   Collection Time: 07/31/17  2:05 PM  Result Value Ref Range   Sodium 147 (H) 135 - 145 mmol/L   Potassium 3.1 (L) 3.5 - 5.1 mmol/L   Chloride 116 (H) 101 - 111 mmol/L   CO2 19 (L) 22 - 32 mmol/L   Glucose, Bld 83 65 - 99 mg/dL   BUN 19 6 - 20 mg/dL   Creatinine, Ser 1.59 (H) 0.44 - 1.00 mg/dL   Calcium 8.4 (L) 8.9 - 10.3 mg/dL   Total Protein 6.7 6.5 - 8.1 g/dL   Albumin 3.4 (L) 3.5 - 5.0 g/dL   AST 39 15 - 41 U/L   ALT 50 14 - 54 U/L   Alkaline Phosphatase 84 38 - 126 U/L   Total Bilirubin 0.5 0.3 - 1.2 mg/dL   GFR calc non Af Amer 34 (L) >60 mL/min   GFR calc Af Amer 40 (L) >60 mL/min    Comment: (NOTE) The eGFR has been calculated using the CKD EPI equation. This calculation has not been validated in all clinical situations. eGFR's persistently <60 mL/min signify possible Chronic Kidney Disease.    Anion gap 12 5 - 15    Comment: Performed at [facility], [address] [place], [place] [address]  CBC     Status: Abnormal   Collection Time: 07/31/17  2:05 PM  Result Value Ref Range   WBC 15.7 (H) 4.0 - 10.5 K/uL    Comment: WHITE COUNT CONFIRMED ON SMEAR   RBC 4.47 3.87 - 5.11 MIL/uL   Hemoglobin 10.1 (L) 12.0 - 15.0 g/dL   HCT 30.1 (L) 36.0 - 46.0 %   MCV 67.3 (L) 78.0 - 100.0 fL   MCH 22.6  (L) 26.0 - 34.0 pg   MCHC 33.6 30.0 - 36.0 g/dL   RDW 17.6 (H) 11.5 - 15.5 %   Platelets 315 150 - 400 K/uL    Comment: PLATELET COUNT CONFIRMED BY SMEAR SPECIMEN CHECKED FOR CLOTS Performed at [facility], [address] [place], [place] [address]   Urinalysis, Routine w reflex microscopic     Status: Abnormal   Collection Time: 07/31/17  2:05 PM  Result Value Ref Range   Color, Urine STRAW (A) YELLOW   APPearance CLEAR CLEAR   Specific Gravity,  Urine 1.010 1.005 - 1.030   pH 6.0 5.0 - 8.0   Glucose, UA NEGATIVE NEGATIVE mg/dL   Hgb urine dipstick NEGATIVE NEGATIVE   Bilirubin Urine NEGATIVE NEGATIVE   Ketones, ur NEGATIVE NEGATIVE mg/dL   Protein, ur NEGATIVE NEGATIVE mg/dL   Nitrite NEGATIVE NEGATIVE   Leukocytes, UA NEGATIVE NEGATIVE    Comment: Microscopic not done on urines with negative protein, blood, leukocytes, nitrite, or glucose < 500 mg/dL. Performed at [facility], [address] [place], [place] [address]   Urine culture     Status: Abnormal   Collection Time: 07/31/17  2:05 PM  Result Value Ref Range   Specimen Description      URINE, CLEAN CATCH Performed at [facility], [address] [place], [place] [address]    Special Requests      Normal Performed at [facility], [address] [place], [place] [address]    Culture (A)     <10,000 COLONIES/mL INSIGNIFICANT GROWTH Performed at [facility], [place] [address], [place], [place] [address]    Report Status 08/01/2017 FINAL   Basic metabolic panel     Status: Abnormal   Collection Time: 08/01/17  7:13 AM  Result Value Ref Range   Sodium 145 135 - 145 mmol/L   Potassium 3.5 3.5 - 5.1 mmol/L   Chloride 118 (H) 101 - 111 mmol/L   CO2 21 (L) 22 - 32 mmol/L   Glucose, Bld 64 (L) 65 - 99 mg/dL   BUN 15 6 - 20 mg/dL   Creatinine, Ser 1.50 (H) 0.44 - 1.00 mg/dL   Calcium 7.0 (L) 8.9 - 10.3 mg/dL   GFR calc non Af Amer 37 (L) >60 mL/min   GFR calc Af  Amer 43 (L) >60 mL/min    Comment: (NOTE) The eGFR has been calculated using the CKD EPI equation. This calculation has not been validated in all clinical situations. eGFR's persistently <60 mL/min signify possible Chronic Kidney Disease.    Anion gap 6 5 - 15    Comment: Performed at [facility], [place] [address], [place], [place] [address]  CBC     Status: Abnormal   Collection Time: 08/01/17  7:13 AM  Result Value Ref Range   WBC 9.4 4.0 - 10.5 K/uL   RBC 3.30 (L) 3.87 - 5.11 MIL/uL   Hemoglobin 7.3 (L) 12.0 - 15.0 g/dL   HCT 23.8 (L) 36.0 - 46.0 %   MCV 72.1 (L) 78.0 - 100.0 fL   MCH 22.1 (L) 26.0 - 34.0 pg   MCHC 30.7 30.0 - 36.0 g/dL   RDW 17.8 (H) 11.5 - 15.5 %   Platelets 241 150 - 400 K/uL    Comment: Performed at [facility], [place] [address], [place], [place] [address]  Magnesium     Status: Abnormal   Collection Time: 08/01/17  7:13 AM  Result Value Ref Range   Magnesium 0.9 (LL) 1.7 - 2.4 mg/dL    Comment: CRITICAL RESULT CALLED TO, READ BACK BY AND VERIFIED WITH: F.FMBWG,YK [id number] '@[id number]'  BY V.WILKINS Performed at [place] [address], [place], [place] [address]   CBC     Status: Abnormal   Collection Time: 08/02/17  4:35 AM  Result Value Ref Range   WBC 9.4 4.0 - 10.5 K/uL   RBC 3.26 (L) 3.87 - 5.11 MIL/uL   Hemoglobin 7.3 (L) 12.0 - 15.0  g/dL   HCT 24.0 (L) 36.0 - 46.0 %   MCV 73.6 (L) 78.0 - 100.0 fL   MCH 22.4 (L) 26.0 - 34.0 pg   MCHC 30.4 30.0 - 36.0 g/dL   RDW 18.2 (H) 11.5 - 15.5 %   Platelets 255 150 - 400 K/uL    Comment: SPECIMEN CHECKED FOR CLOTS REPEATED TO VERIFY PLATELET COUNT CONFIRMED BY SMEAR Performed at [facility], [place] [address] [place], [place] [address]   Basic metabolic panel     Status: Abnormal   Collection Time: 08/02/17  4:35 AM  Result Value Ref Range   Sodium 141 135 - 145 mmol/L   Potassium 4.0 3.5 - 5.1 mmol/L   Chloride 117 (H) 101 - 111  mmol/L   CO2 18 (L) 22 - 32 mmol/L   Glucose, Bld 80 65 - 99 mg/dL   BUN 13 6 - 20 mg/dL   Creatinine, Ser 1.49 (H) 0.44 - 1.00 mg/dL   Calcium 7.2 (L) 8.9 - 10.3 mg/dL   GFR calc non Af Amer 37 (L) >60 mL/min   GFR calc Af Amer 43 (L) >60 mL/min    Comment: (NOTE) The eGFR has been calculated using the CKD EPI equation. This calculation has not been validated in all clinical situations. eGFR's persistently <60 mL/min signify possible Chronic Kidney Disease.    Anion gap 6 5 - 15    Comment: Performed at [facility], [place] [address], [place], [place] [address]    Imaging / Studies: Ct Abdomen Pelvis Wo Contrast  Result Date: 07/31/2017 CLINICAL DATA:  Diffuse abdominal pain for 1 day, distension, nausea and vomiting. Assess high-grade bowel obstruction. History of splenectomy, chronic kidney disease, lupus, renal cancer. EXAM: CT ABDOMEN AND PELVIS WITHOUT CONTRAST TECHNIQUE: Multidetector CT imaging of the abdomen and pelvis was performed following the standard protocol without IV contrast. COMPARISON:  CT abdomen and pelvis May 08, 2017 FINDINGS: LOWER CHEST: New small pleural effusions. Heart size is normal. No pericardial effusion. Stable nodular scarring LEFT lung base. HEPATOBILIARY: Normal. PANCREAS: Normal.  Tail is prolapsed in the renal fossa. SPLEEN: Spondylosis.  Prolapsed into renal fossa. ADRENALS/URINARY TRACT: Status post LEFT nephrectomy. No nephrolithiasis, hydronephrosis; limited assessment for renal masses by nonenhanced CT. The unopacified ureters are normal in course and caliber. Urinary bladder is partially distended and unremarkable. Normal adrenal glands. STOMACH/BOWEL: The stomach, small and large bowel are normal in course and caliber without inflammatory changes, sensitivity decreased by lack of enteric contrast. Normal appendix. VASCULAR/LYMPHATIC: Aortoiliac vessels are normal in course and caliber. Mild calcific atherosclerosis. No  lymphadenopathy by CT size criteria. REPRODUCTIVE: Similar mildly enlarged dense uterus compatible with leiomyoma with subserosal 3.3 cm calcified leiomyoma, less likely calcified adnexa. OTHER: Small volume free fluid in pelvis, and about the RIGHT presumed subserosal leiomyoma. No intraperitoneal free air. MUSCULOSKELETAL: 10 x 2.3 cm fluid collection LEFT anterior abdominal wall subcutaneous fat with fascial defect. Bilateral femoral head avascular necrosis, slight collapse on the RIGHT. 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 Stable grade 1 L5-S1 retrolisthesis with vacuum disc. Severe L4-5 neural foraminal narrowing. IMPRESSION: 1. New small volume free fluid in pelvis, considering distribution this may gynecologic in etiology. Consider pelvic ultrasound if there are referable symptoms. 2. Similar LEFT anterior abdominal wall fluid collection associated with fascial defect, possibly related to herniorrhaphy. Status post LEFT nephrectomy and splenectomy. 3. Stable grade 2 L4-5 anterolisthesis, severe L4-5 neural foraminal narrowing.  4. Bilateral femoral head AVN, bilateral femoral head avascular necrosis with slight collapse on the RIGHT. 5. New small pleural effusions. Aortic Atherosclerosis (ICD10-I70.0). Electronically Signed   By: Elon Alas M.D.   On: 07/31/2017 15:58   Dg Chest 2 View  Result Date: 07/31/2017 CLINICAL DATA:  Nausea, vomiting. Abdominal pain. Cough and weakness. EXAM: CHEST - 2 VIEW COMPARISON:  07/23/2017 FINDINGS: Heart size is accentuated by the AP technique. There is mild prominence of interstitial markings of the lungs, increased since the prior study. More focal opacity at the LEFT lung base may represent atelectasis or infiltrate. IMPRESSION: Increased prominence of interstitial markings, raising the question viral infection. Question early infiltrate versus atelectasis in the LEFT lung base.  Electronically Signed   By: Nolon Nations M.D.   On: 07/31/2017 17:38   US Transvaginal Non-ob  Result Date: 07/31/2017 CLINICAL DATA:  Free fluid identified on CT exam. Generalized abdominal pain. Nausea and vomiting for 2 days. Recently hospitalized urinary sepsis. Patient is postmenopausal. EXAM: TRANSABDOMINAL AND TRANSVAGINAL ULTRASOUND OF PELVIS TECHNIQUE: Both transabdominal and transvaginal ultrasound examinations of the pelvis were performed. Transabdominal technique was performed for global imaging of the pelvis including uterus, ovaries, adnexal regions, and pelvic cul-de-sac. It was necessary to proceed with endovaginal exam following the transabdominal exam to visualize the uterus and endometrium, ovaries. COMPARISON:  CT of the abdomen and pelvis on 07/31/2017 and MRI of the pelvis on 04/25/2014 FINDINGS: Uterus Measurements: 8.2 x 5.5 x 4.6 centimeters. There is a large LOWER uterine segment fibroid replacing the normal architecture LOWER uterus. Fibroid measures 4.8 x 5.2 centimeters, larger when compared to prior study. The more normal fundal portion of the uterus is not well seen sonographically. Endometrium Thickness: Not visualized. Right ovary Measurements: The ovary is not visualized, either absent or obscured. Left ovary Measurements: The ovary is not visualized, either absent or obscured. Other findings Trace free pelvic fluid noted. Study quality is degraded by overlying bowel gas and full bladder. IMPRESSION: 1. Ultrasound is limited in further characterization of the pelvic abnormalities. Large LOWER uterine segment fibroid measures at least 5.2 centimeters and replaces the normal anatomy of the LOWER uterus. I suspect a large calcified fundal fibroid which is not imaged on the ultrasound. 2. Nonvisualized endometrium. 3. The ovaries are not imaged. The small amount of fluid seen in the region of the RIGHT ovary is not imaged sonographically. Electronically Signed   By: Nolon Nations M.D.   On: 07/31/2017 17:36   US Pelvis Complete  Result Date: 07/31/2017 CLINICAL DATA:  Free fluid identified on CT exam. Generalized abdominal pain. Nausea and vomiting for 2 days. Recently hospitalized urinary sepsis. Patient is postmenopausal. EXAM: TRANSABDOMINAL AND TRANSVAGINAL ULTRASOUND OF PELVIS TECHNIQUE: Both transabdominal and transvaginal ultrasound examinations of the pelvis were performed. Transabdominal technique was performed for global imaging of the pelvis including uterus, ovaries, adnexal regions, and pelvic cul-de-sac. It was necessary to proceed with endovaginal exam following the transabdominal exam to visualize the uterus and endometrium, ovaries. COMPARISON:  CT of the abdomen and pelvis on 07/31/2017 and MRI of the pelvis on 04/25/2014 FINDINGS: Uterus Measurements: 8.2 x 5.5 x 4.6 centimeters. There is a large LOWER uterine segment fibroid replacing the normal architecture LOWER uterus. Fibroid measures 4.8 x 5.2 centimeters, larger when compared to prior study. The more normal fundal portion of the uterus is not well seen sonographically. Endometrium Thickness: Not visualized. Right ovary Measurements: The ovary is not visualized, either absent or obscured. Left ovary Measurements:  The ovary is not visualized, either absent or obscured. Other findings Trace free pelvic fluid noted. Study quality is degraded by overlying bowel gas and full bladder. IMPRESSION: 1. Ultrasound is limited in further characterization of the pelvic abnormalities. Large LOWER uterine segment fibroid measures at least 5.2 centimeters and replaces the normal anatomy of the LOWER uterus. I suspect a large calcified fundal fibroid which is not imaged on the ultrasound. 2. Nonvisualized endometrium. 3. The ovaries are not imaged. The small amount of fluid seen in the region of the RIGHT ovary is not imaged sonographically. Electronically Signed   By: Nolon Nations M.D.   On: 07/31/2017 17:36     Medications / Allergies: per chart  Antibiotics: Anti-infectives (From admission, onward)   Start     Dose/Rate Route Frequency Ordered Stop   08/01/17 1000  hydroxychloroquine (PLAQUENIL) tablet 200 mg     200 mg Oral Daily 08/01/17 0124     07/31/17 2245  cefTRIAXone (ROCEPHIN) 2 g in sodium chloride 0.9 % 100 mL IVPB     2 g 200 mL/hr over 30 Minutes Intravenous Daily at bedtime 07/31/17 2232 08/04/17 2159        Note: Portions of this report may have been transcribed using voice recognition software. Every effort was made to ensure accuracy; however, inadvertent computerized transcription errors may be present.   Any transcriptional errors that result from this process are unintentional.     Adin Hector, MD, FACS, MASCRS Gastrointestinal and Minimally Invasive Surgery    [address] [place], [place] [address] [phone] Main / Paging [phone] Fax

## 2017-08-03 DIAGNOSIS — R112 Nausea with vomiting, unspecified: Secondary | ICD-10-CM | POA: Diagnosis not present

## 2017-08-03 LAB — BASIC METABOLIC PANEL
Anion gap: 7 (ref 5–15)
BUN: 11 mg/dL (ref 6–20)
CHLORIDE: 117 mmol/L — AB (ref 101–111)
CO2: 17 mmol/L — ABNORMAL LOW (ref 22–32)
CREATININE: 1.54 mg/dL — AB (ref 0.44–1.00)
Calcium: 8 mg/dL — ABNORMAL LOW (ref 8.9–10.3)
GFR calc Af Amer: 41 mL/min — ABNORMAL LOW (ref >60)
GFR, EST NON AFRICAN AMERICAN: 36 mL/min — AB (ref >60)
Glucose, Bld: 63 mg/dL — ABNORMAL LOW (ref 65–99)
POTASSIUM: 4.8 mmol/L (ref 3.5–5.1)
SODIUM: 141 mmol/L (ref 135–145)

## 2017-08-03 LAB — CBC
HCT: 25.5 % — ABNORMAL LOW (ref 36.0–46.0)
HEMOGLOBIN: 7.6 g/dL — AB (ref 12.0–15.0)
MCH: 21.8 pg — AB (ref 26.0–34.0)
MCHC: 29.8 g/dL — ABNORMAL LOW (ref 30.0–36.0)
MCV: 73.3 fL — ABNORMAL LOW (ref 78.0–100.0)
PLATELETS: 235 10*3/uL (ref 150–400)
RBC: 3.48 MIL/uL — AB (ref 3.87–5.11)
RDW: 18.8 % — ABNORMAL HIGH (ref 11.5–15.5)
WBC: 9.4 10*3/uL (ref 4.0–10.5)

## 2017-08-03 LAB — GRAM STAIN: Gram Stain: NONE SEEN

## 2017-08-03 LAB — HEMOGLOBIN A1C
HEMOGLOBIN A1C: 4.7 % — AB (ref 4.8–5.6)
MEAN PLASMA GLUCOSE: 88.19 mg/dL

## 2017-08-03 MED ORDER — HYDRALAZINE HCL 20 MG/ML IJ SOLN
5.0000 mg | Freq: Four times a day (QID) | INTRAMUSCULAR | Status: DC | PRN
  Administered 2017-08-03: 5 mg via INTRAVENOUS
  Filled 2017-08-03: qty 1

## 2017-08-03 NOTE — Progress Notes (Signed)
Patient discharged to home with family, discharge instructions reviewed with patient who verbalized understanding. No new RX.

## 2017-08-03 NOTE — Discharge Summary (Signed)
Physician Discharge Summary  Milley Vining RSW:[id number] DOB: 22-Mar-1955 DOA: 07/31/2017  PCP: Beckie Salts, MD  Admit date: 07/31/2017 Discharge date: 08/03/2017  Admitted From: home Disposition: home Recommendations for Outpatient Follow-up:  1. Follow up with PCP in 1-2 weeks 2. Please obtain BMP/CBC in one week 3. Follow up with dr palmer surgeon 4. Follow up with GYN 6/26 FOR FIBROIDS 5. FOLLOW UP WITH GI  6. 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  Patientjustdischarged from the hospital6/13-6/19/19-managed for sepsis secondary to Klebsiella pneumonia bacteremia and UTI.Discharged home on cephalexin.Reported improvements clinically untilyesterday.  ED Course:Afebrile,systolic blood pressure 093G to 180s.WBC-elevated 15.7.K low 3.1.Sodium- 147.Creatinine-improved compared to prior 1.59.lipase-30.CT abdomen and pelvis wocontrast-new small volume free fluid in pelvis, ?Gynecologic in etiology,pelvic ultrasound if referrablesymptoms,small left abdominal wall fluid collection associated with fascial defect(see detailed report),bilateral femoral AVN.Subsequenttransvaginal and transabdominal pelvicUs-uterine fibroid.Small amount of fluid in the region of right ovary.Patient was given 2 L normal saline bolus.Hospitalist was called to admit and[facility].    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  2]status post Klebsiella bacteremia UTI repeat UA last night is clear. But upon admission her white count was 15.7 and it has come down to 9.4 after starting the Rocephin. DC Rocephin.  3] hypomagnesemia and hypokalemia-repleted.  4]CKD stage IV stable.  5]history of lupus patient on hydroxyzine, prednisone.Restart mycophenolate.  6]history of splenectomy and single kidney status post nephrectomy secondary to renal cell cancer and patient has stage 4 CKD which is stable at this time monitor closely.   7]AV necrosis of bilateral hip patient reports pain in the left hip-which was not her presenting complaint at this time. However CT scan of the abdomen pelvis done for work-up shows New small volume free fluid in pelvis, considering distribution this may gynecologic in etiology. Consider pelvic ultrasound ifthere are referable symptoms. Similar LEFT anterior abdominal wall fluid collection associated with fascial defect, possibly related to herniorrhaphy. Status post LEFT nephrectomy and splenectomy. Stable grade 2 L4-5 anterolisthesis, severe L4-5 neural foraminal narrowing. Bilateral femoral head AVN, bilateral femoral head avascular necrosis with slight collapse on the RIGHT. New small pleural effusions. She will need outpatient follow up with ORTHO. . 8]large fibroids -patient is aware that she has fibroids and she has f/u with GYN  scheduled this week.  9]ANEMIAwith a drop in hemoglobin from 10 to 7.3 on dc ? Hemodilution with ivf.s/p drainage of seroma drained 30 cc serous fluid.hb upto 7.6 without transfusion.she remained stable hemodynamically.  10]chronic abdominal wall fluid collection-s/p drainage.follow up with dr Automotive engineer.   Discharge Instructions  Discharge Instructions    Call MD  for:  difficulty breathing, headache or visual disturbances   Complete by:  As directed    Call MD for:  persistant dizziness or light-headedness   Complete by:  As directed    Call MD for:  persistant nausea and vomiting   Complete by:  As directed    Call MD for:  redness, tenderness, or signs of infection (pain, swelling, redness, odor or green/yellow discharge around incision site)   Complete by:  As directed    Call MD for:  severe uncontrolled pain   Complete by:  As directed    Call MD for:  temperature >100.4   Complete by:  As directed    Diet - low sodium heart healthy   Complete by:  As directed    Increase activity slowly   Complete by:  As directed      Allergies as of 08/03/2017   No Known Allergies     Medication List    STOP taking these medications   cephALEXin 500 MG capsule Commonly known as:  KEFLEX   oxyCODONE-acetaminophen 5-325 MG tablet Commonly known as:  PERCOCET/ROXICET     TAKE these medications   amLODipine 10 MG tablet Commonly known as:  NORVASC Take 10 mg by mouth daily.   furosemide 40 MG tablet Commonly known as:  LASIX Take 40 mg by mouth daily.   HYDROcodone-acetaminophen 5-325 MG tablet Commonly known as:  NORCO/VICODIN Take 1 tablet by mouth every 8 (eight) hours as needed for pain.   hydroxychloroquine 200 MG tablet Commonly known as:  PLAQUENIL Take 200 mg by mouth daily.   hydrOXYzine 50 MG tablet Commonly known as:  ATARAX/VISTARIL Take 50 mg by mouth 3 (three) times daily as needed for nausea.   mycophenolate 500 MG tablet Commonly known as:   CELLCEPT Take 500 mg by mouth daily.   predniSONE 10 MG tablet Commonly known as:  DELTASONE Take 10 mg by mouth daily with breakfast. What changed:  Another medication with the same name was removed. Continue taking this medication, and follow the directions you see here.   pregabalin 150 MG capsule Commonly known as:  LYRICA Take 150 mg by mouth 2 (two) times daily.   traZODone 50 MG tablet Commonly known as:  DESYREL Take 50 mg by mouth daily.   Vitamin D (Ergocalciferol) 50000 units Caps capsule Commonly known as:  DRISDOL Take 1 capsule by mouth once a week.       No Known Allergies  Consultations: gen surgery  Procedures/Studies: Ct Abdomen Pelvis Wo Contrast  Result Date: 07/31/2017 CLINICAL DATA:  Diffuse abdominal pain for 1 day, distension, nausea and vomiting. Assess high-grade bowel obstruction. History of splenectomy, chronic kidney disease, lupus, renal cancer. EXAM: CT ABDOMEN AND PELVIS WITHOUT CONTRAST TECHNIQUE: Multidetector CT imaging of the abdomen and pelvis was performed following the standard protocol without IV contrast. COMPARISON:  CT abdomen and pelvis May 08, 2017 FINDINGS: LOWER CHEST: New small pleural effusions. Heart size is normal. No pericardial effusion. Stable nodular scarring LEFT lung base. HEPATOBILIARY: Normal. PANCREAS: Normal.  Tail is prolapsed in the renal fossa. SPLEEN: Spondylosis.  Prolapsed into renal fossa. ADRENALS/URINARY TRACT: Status post LEFT nephrectomy. No nephrolithiasis, hydronephrosis; limited assessment for renal masses by nonenhanced CT. The unopacified ureters are normal in course and caliber. Urinary bladder is partially distended and unremarkable. Normal adrenal glands. STOMACH/BOWEL: The stomach, small and large bowel are normal in course and caliber without inflammatory changes, sensitivity decreased by lack of enteric contrast. Normal appendix.  VASCULAR/LYMPHATIC: Aortoiliac vessels are normal in course and caliber.  Mild calcific atherosclerosis. No lymphadenopathy by CT size criteria. REPRODUCTIVE: Similar mildly enlarged dense uterus compatible with leiomyoma with subserosal 3.3 cm calcified leiomyoma, less likely calcified adnexa. OTHER: Small volume free fluid in pelvis, and about the RIGHT presumed subserosal leiomyoma. No intraperitoneal free air. MUSCULOSKELETAL: 10 x 2.3 cm fluid collection LEFT anterior abdominal wall subcutaneous fat with fascial defect. Bilateral femoral head avascular necrosis, slight collapse on the RIGHT. 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 Stable grade 1 L5-S1 retrolisthesis with vacuum disc. Severe L4-5 neural foraminal narrowing. IMPRESSION: 1. New small volume free fluid in pelvis, considering distribution this may gynecologic in etiology. Consider pelvic ultrasound if there are referable symptoms. 2. Similar LEFT anterior abdominal wall fluid collection associated with fascial defect, possibly related to herniorrhaphy. Status post LEFT nephrectomy and splenectomy. 3. Stable grade 2 L4-5 anterolisthesis, severe L4-5 neural foraminal narrowing. 4. Bilateral femoral head AVN, bilateral femoral head avascular necrosis with slight collapse on the RIGHT. 5. New small pleural effusions. Aortic Atherosclerosis (ICD10-I70.0). Electronically Signed   By: Elon Alas M.D.   On: 07/31/2017 15:58   Dg Chest 2 View  Result Date: 07/31/2017 CLINICAL DATA:  Nausea, vomiting. Abdominal pain. Cough and weakness. EXAM: CHEST - 2 VIEW COMPARISON:  07/23/2017 FINDINGS: Heart size is accentuated by the AP technique. There is mild prominence of interstitial markings of the lungs, increased since the prior study. More focal opacity at the LEFT lung base may represent atelectasis or infiltrate. IMPRESSION: Increased prominence of interstitial markings, raising the question viral infection. Question early infiltrate versus atelectasis  in the LEFT lung base. Electronically Signed   By: Nolon Nations M.D.   On: 07/31/2017 17:38   Dg Chest 2 View  Result Date: 07/23/2017 CLINICAL DATA:  Fevers and vomiting EXAM: CHEST - 2 VIEW COMPARISON:  02/22/2016 FINDINGS: The heart size and mediastinal contours are within normal limits. Both lungs are clear. The visualized skeletal structures are unremarkable. IMPRESSION: No active cardiopulmonary disease. Electronically Signed   By: Inez Catalina M.D.   On: 07/23/2017 13:57   [place] Abscess Drain  Result Date: 08/02/2017 INDICATION: Chronic fluid collection within the soft tissues of the ventral aspect the left mid hemiabdomen. Please perform ultrasound-guided aspiration for tissue diagnostic purposes. EXAM: ULTRASOUND GUIDED BODY WALL SEROMA ASPIRATION COMPARISON:  CT abdomen pelvis-07/31/2017; 05/08/2017; ultrasound-guided body wall fluid collection aspiration-02/17/2016 MEDICATIONS: The patient is currently admitted to the hospital and receiving intravenous antibiotics. The antibiotics were administered within an appropriate time frame prior to the initiation of the procedure. ANESTHESIA/SEDATION: None CONTRAST:  None COMPLICATIONS: None immediate. PROCEDURE: Informed written consent was obtained from the patient after a discussion of the risks, benefits and alternatives to treatment. The patient was placed supine on her hospital bed and ultrasound scanning of the ventral wall of the left mid hemiabdomen demonstrates a serpiginous (at least) 5.9 x 1.2 cm anechoic fluid collection within the subjacent subcutaneous tissues (image 5) compatible with the serpiginous fluid collection seen on preceding abdominal CT image 32, series 2). The procedure was planned. A timeout was performed prior to the initiation of the procedure. The skin overlying the ventral aspect the left mid hemiabdomen was prepped and draped in usual sterile fashion. The overlying soft tissues were anesthetized with 1% lidocaine with  epinephrine. The fluid collection was cannulated with an 18 gauge spinal needle. Multiple ultrasound images were saved for procedural documentation purposes. Next,  approximately 30 cc of serous, straw-colored fluid was aspirated from the collection as the needle was slowly withdrawn. Postprocedural imaging demonstrates near complete resolution of the body wall seroma. A dressing was placed. The patient tolerated the procedure well without immediate post procedural complication. IMPRESSION: Successful ultrasound-guided aspiration of approximately 30 cc of serous, straw-colored fluid from chronic fluid collection with the left mid hemiabdomen favored to represent a seroma. Samples were sent to the laboratory as requested by the ordering clinical team. Electronically Signed   By: Sandi Mariscal M.D.   On: 08/02/2017 12:41   US Transvaginal Non-ob  Result Date: 07/31/2017 CLINICAL DATA:  Free fluid identified on CT exam. Generalized abdominal pain. Nausea and vomiting for 2 days. Recently hospitalized urinary sepsis. Patient is postmenopausal. EXAM: TRANSABDOMINAL AND TRANSVAGINAL ULTRASOUND OF PELVIS TECHNIQUE: Both transabdominal and transvaginal ultrasound examinations of the pelvis were performed. Transabdominal technique was performed for global imaging of the pelvis including uterus, ovaries, adnexal regions, and pelvic cul-de-sac. It was necessary to proceed with endovaginal exam following the transabdominal exam to visualize the uterus and endometrium, ovaries. COMPARISON:  CT of the abdomen and pelvis on 07/31/2017 and MRI of the pelvis on 04/25/2014 FINDINGS: Uterus Measurements: 8.2 x 5.5 x 4.6 centimeters. There is a large LOWER uterine segment fibroid replacing the normal architecture LOWER uterus. Fibroid measures 4.8 x 5.2 centimeters, larger when compared to prior study. The more normal fundal portion of the uterus is not well seen sonographically. Endometrium Thickness: Not visualized. Right ovary  Measurements: The ovary is not visualized, either absent or obscured. Left ovary Measurements: The ovary is not visualized, either absent or obscured. Other findings Trace free pelvic fluid noted. Study quality is degraded by overlying bowel gas and full bladder. IMPRESSION: 1. Ultrasound is limited in further characterization of the pelvic abnormalities. Large LOWER uterine segment fibroid measures at least 5.2 centimeters and replaces the normal anatomy of the LOWER uterus. I suspect a large calcified fundal fibroid which is not imaged on the ultrasound. 2. Nonvisualized endometrium. 3. The ovaries are not imaged. The small amount of fluid seen in the region of the RIGHT ovary is not imaged sonographically. Electronically Signed   By: Nolon Nations M.D.   On: 07/31/2017 17:36   US Pelvis Complete  Result Date: 07/31/2017 CLINICAL DATA:  Free fluid identified on CT exam. Generalized abdominal pain. Nausea and vomiting for 2 days. Recently hospitalized urinary sepsis. Patient is postmenopausal. EXAM: TRANSABDOMINAL AND TRANSVAGINAL ULTRASOUND OF PELVIS TECHNIQUE: Both transabdominal and transvaginal ultrasound examinations of the pelvis were performed. Transabdominal technique was performed for global imaging of the pelvis including uterus, ovaries, adnexal regions, and pelvic cul-de-sac. It was necessary to proceed with endovaginal exam following the transabdominal exam to visualize the uterus and endometrium, ovaries. COMPARISON:  CT of the abdomen and pelvis on 07/31/2017 and MRI of the pelvis on 04/25/2014 FINDINGS: Uterus Measurements: 8.2 x 5.5 x 4.6 centimeters. There is a large LOWER uterine segment fibroid replacing the normal architecture LOWER uterus. Fibroid measures 4.8 x 5.2 centimeters, larger when compared to prior study. The more normal fundal portion of the uterus is not well seen sonographically. Endometrium Thickness: Not visualized. Right ovary Measurements: The ovary is not visualized,  either absent or obscured. Left ovary Measurements: The ovary is not visualized, either absent or obscured. Other findings Trace free pelvic fluid noted. Study quality is degraded by overlying bowel gas and full bladder. IMPRESSION: 1. Ultrasound is limited in further characterization of the pelvic abnormalities. Large LOWER  uterine segment fibroid measures at least 5.2 centimeters and replaces the normal anatomy of the LOWER uterus. I suspect a large calcified fundal fibroid which is not imaged on the ultrasound. 2. Nonvisualized endometrium. 3. The ovaries are not imaged. The small amount of fluid seen in the region of the RIGHT ovary is not imaged sonographically. Electronically Signed   By: Nolon Nations M.D.   On: 07/31/2017 17:36    (Echo, Carotid, EGD, Colonoscopy, ERCP)    Subjective:   Discharge Exam: Vitals:   08/03/17 0506 08/03/17 0630  BP: (!) 141/102 137/71  Pulse: (!) 103   Resp: 16   Temp: 98.3 F (36.8 C)   SpO2: 99%    Vitals:   08/02/17 1401 08/02/17 2149 08/03/17 0506 08/03/17 0630  BP: (!) 145/87 (!) 130/103 (!) 141/102 137/71  Pulse: 97 (!) 55 (!) 103   Resp: 16 16 16    Temp: 98.5 F (36.9 C) 98.6 F (37 C) 98.3 F (36.8 C)   TempSrc: Oral Oral Oral   SpO2: 100% 100% 99%   Weight:      Height:        General: Pt is alert, awake, not in acute distress Cardiovascular: RRR, S1/S2 +, no rubs, no gallops Respiratory: CTA bilaterally, no wheezing, no rhonchi Abdominal: Soft, NT, ND, bowel sounds + Extremities: no edema, no cyanosis    The results of significant diagnostics from this hospitalization (including imaging, microbiology, ancillary and laboratory) are listed below for reference.     Microbiology: Recent Results (from the past 240 hour(s))  Culture, blood (routine x 2)     Status: None   Collection Time: 07/25/17  4:50 AM  Result Value Ref Range Status   Specimen Description   Final    BLOOD LEFT WRIST Performed at [facility] [address] [place], [place] [address]    Special Requests   Final    BOTTLES DRAWN AEROBIC ONLY Blood Culture results may not be optimal due to an inadequate volume of blood received in culture bottles Performed at [facility] [address] [place], [place] [address]    Culture   Final    NO GROWTH 5 DAYS Performed at [facility], [place] [address] [place], [place] [address]    Report Status 07/30/2017 FINAL  Final  Culture, blood (routine x 2)     Status: None   Collection Time: 07/25/17  4:50 AM  Result Value Ref Range Status   Specimen Description BLOOD LEFT HAND  Final   Special Requests   Final    BOTTLES DRAWN AEROBIC ONLY Blood Culture results may not be optimal due to an inadequate volume of blood received in culture bottles Performed at [facility], [place] [address] [place], [place] [address]    Culture   Final    NO GROWTH 5 DAYS Performed at [facility], [place] [address], [place], [place] [address]    Report Status 07/30/2017 FINAL  Final  Urine culture     Status: Abnormal   Collection Time: 07/31/17  2:05 PM  Result Value Ref Range Status   Specimen Description   Final    URINE, CLEAN CATCH Performed at [facility], [address] [place], [place] [address]    Special Requests   Final    Normal Performed at [facility], [place] [place],  [address]    Culture (A)  Final    <10,000 COLONIES/mL INSIGNIFICANT GROWTH Performed at [facility] [address], [place], [place] [address]    Report Status 08/01/2017 FINAL  Final  Culture, body fluid-bottle     Status: None (Preliminary result)   Collection Time: 08/02/17 12:12 PM  Result Value Ref Range Status   Specimen Description FLUID  Final   Special Requests   Final    BOTTLES DRAWN AEROBIC AND ANAEROBIC Blood Culture adequate volume   Culture PENDING  Incomplete   Report Status PENDING   Incomplete  Gram stain     Status: None   Collection Time: 08/02/17 12:12 PM  Result Value Ref Range Status   Specimen Description FLUID  Final   Special Requests SYRINGE  Final   Gram Stain   Final    NO WBC SEEN NO ORGANISMS SEEN Performed at [facility], [place] [address] [place], [place] [address]    Report Status 08/03/2017 FINAL  Final     Labs: BNP (last 3 results) No results for input(s): BNP in the last 8760 hours. Basic Metabolic Panel: Recent Labs  Lab 07/29/17 0442 07/31/17 1405 08/01/17 0713 08/02/17 0435 08/03/17 0525  NA 146* 147* 145 141 141  K 4.1 3.1* 3.5 4.0 4.8  CL 119* 116* 118* 117* 117*  CO2 19* 19* 21* 18* 17*  GLUCOSE 91 83 64* 80 63*  BUN 25* 19 15 13 11   CREATININE 1.74* 1.59* 1.50* 1.49* 1.54*  CALCIUM 7.5* 8.4* 7.0* 7.2* 8.0*  MG  --   --  0.9*  --   --    Liver Function Tests: Recent Labs  Lab 07/31/17 1405  AST 39  ALT 50  ALKPHOS 84  BILITOT 0.5  PROT 6.7  ALBUMIN 3.4*   Recent Labs  Lab 07/31/17 1405  LIPASE 30   No results for input(s): AMMONIA in the last 168 hours. CBC: Recent Labs  Lab 07/28/17 0454 07/29/17 0442 07/31/17 1405 08/01/17 0713 08/02/17 0435 08/03/17 0525  WBC 14.8* 15.5* 15.7* 9.4 9.4 9.4  NEUTROABS 12.5* 13.0*  --   --   --   --   HGB 10.0* 8.3* 10.1* 7.3* 7.3* 7.6*  HCT 32.8* 26.1* 30.1* 23.8* 24.0* 25.5*  MCV 71.6* 69.8* 67.3* 72.1* 73.6* 73.3*  PLT 205 188 315 241 255 235   Cardiac Enzymes: No results for input(s): CKTOTAL, CKMB, CKMBINDEX, TROPONINI in the last 168 hours. BNP: Invalid input(s): POCBNP CBG: No results for input(s): GLUCAP in the last 168 hours. D-Dimer No results for input(s): DDIMER in the last 72 hours. Hgb A1c Recent Labs    08/03/17 0551  HGBA1C 4.7*   Lipid Profile No results for input(s): CHOL, HDL, LDLCALC, TRIG, CHOLHDL, LDLDIRECT in the last 72 hours. Thyroid function studies No results for input(s): TSH, T4TOTAL, T3FREE, THYROIDAB in the last 72  hours.  Invalid input(s): FREET3 Anemia work up No results for input(s): VITAMINB12, FOLATE, FERRITIN, TIBC, IRON, RETICCTPCT in the last 72 hours. Urinalysis    Component Value Date/Time   COLORURINE STRAW (A) 07/31/2017 1405   APPEARANCEUR CLEAR 07/31/2017 1405   LABSPEC 1.010 07/31/2017 1405   PHURINE 6.0 07/31/2017 1405   GLUCOSEU NEGATIVE 07/31/2017 1405   HGBUR NEGATIVE 07/31/2017 1405   BILIRUBINUR NEGATIVE 07/31/2017 1405   KETONESUR NEGATIVE 07/31/2017 1405   PROTEINUR NEGATIVE 07/31/2017 1405   NITRITE NEGATIVE 07/31/2017 1405   LEUKOCYTESUR NEGATIVE 07/31/2017 1405   Sepsis Labs Invalid input(s): PROCALCITONIN,  WBC,  LACTICIDVEN Microbiology  Recent Results (from the past 240 hour(s))  Culture, blood (routine x 2)     Status: None   Collection Time: 07/25/17  4:50 AM  Result Value Ref Range Status   Specimen Description   Final    BLOOD LEFT WRIST Performed at [facility] [address] [place], [place] [address]    Special Requests   Final    BOTTLES DRAWN AEROBIC ONLY Blood Culture results may not be optimal due to an inadequate volume of blood received in culture bottles Performed at [facility] [address] [place], [place] [address]    Culture   Final    NO GROWTH 5 DAYS Performed at [facility], [place] [address] [place], [place] [address]    Report Status 07/30/2017 FINAL  Final  Culture, blood (routine x 2)     Status: None   Collection Time: 07/25/17  4:50 AM  Result Value Ref Range Status   Specimen Description BLOOD LEFT HAND  Final   Special Requests   Final    BOTTLES DRAWN AEROBIC ONLY Blood Culture results may not be optimal due to an inadequate volume of blood received in culture bottles Performed at [facility], [place] [address] [place], [place] [address]    Culture   Final    NO GROWTH 5 DAYS Performed at [facility], [place] [address], [place], [place] [address]     Report Status 07/30/2017 FINAL  Final  Urine culture     Status: Abnormal   Collection Time: 07/31/17  2:05 PM  Result Value Ref Range Status   Specimen Description   Final    URINE, CLEAN CATCH Performed at [facility], [address] [place], [place] [address]    Special Requests   Final    Normal Performed at [facility], [place] [place], [place] [address]    Culture (A)  Final    <10,000 COLONIES/mL INSIGNIFICANT GROWTH Performed at [facility], [place] [address] [place], [place] [address]    Report Status 08/01/2017 FINAL  Final  Culture, body fluid-bottle     Status: None (Preliminary result)   Collection Time: 08/02/17 12:12 PM  Result Value Ref Range Status   Specimen Description FLUID  Final   Special Requests   Final    BOTTLES DRAWN AEROBIC AND ANAEROBIC Blood Culture adequate volume   Culture PENDING  Incomplete   Report Status PENDING  Incomplete  Gram stain     Status: None   Collection Time: 08/02/17 12:12 PM  Result Value Ref Range Status   Specimen Description FLUID  Final   Special Requests SYRINGE  Final   Gram Stain   Final    NO WBC SEEN NO ORGANISMS SEEN Performed at [facility], [place] [address], [place], [place] [address]    Report Status 08/03/2017 FINAL  Final     Time coordinating discharge: 35 minutes  SIGNED:   Georgette Shell, MD  [facility] 08/03/2017, 10:10 AM Pager   If 7PM-7AM, please contact night-coverage [email or web address] Password [email or web address]

## 2017-08-03 NOTE — Progress Notes (Signed)
[facility] Progress Note     Subjective: CC:  States that since abd wall aspiration yesterday her abd has felt tight and sore. Denies nausea or vomiting. Tolerating clears. Having flatus. Denies BM. At baseline has a BM daily.   Objective: Vital signs in last 24 hours: Temp:  [98.3 F (36.8 C)-98.6 F (37 C)] 98.3 F (36.8 C) (06/24 0506) Pulse Rate:  [55-103] 103 (06/24 0506) Resp:  [16] 16 (06/24 0506) BP: (130-145)/(71-103) 137/71 (06/24 0630) SpO2:  [99 %-100 %] 99 % (06/24 0506) Last BM Date: 07/31/17  Intake/Output from previous day: 06/23 0701 - 06/24 0700 In: 2697.5 [P.O.:1320; I.V.:1377.5] Out: -  Intake/Output this shift: No intake/output data recorded.  PE: Gen:  Alert, NAD, pleasant and cooperative  Card:  Regular rate and rhythm, pedal pulses 2+ BL Pulm:  Normal effort, clear to auscultation bilaterallyi Abd: Soft, mild TTP right mid abdomen, non-distended, bowel sounds present in all 4 quadrants, previous surgical scar noted. Skin: warm and dry, no rashes  Psych: A&Ox3   Lab Results:  Recent Labs    08/02/17 0435 08/03/17 0525  WBC 9.4 9.4  HGB 7.3* 7.6*  HCT 24.0* 25.5*  PLT 255 235   BMET Recent Labs    08/02/17 0435 08/03/17 0525  NA 141 141  K 4.0 4.8  CL 117* 117*  CO2 18* 17*  GLUCOSE 80 63*  BUN 13 11  CREATININE 1.49* 1.54*  CALCIUM 7.2* 8.0*   PT/INR No results for input(s): LABPROT, INR in the last 72 hours. CMP     Component Value Date/Time   NA 141 08/03/2017 0525   K 4.8 08/03/2017 0525   CL 117 (H) 08/03/2017 0525   CO2 17 (L) 08/03/2017 0525   GLUCOSE 63 (L) 08/03/2017 0525   BUN 11 08/03/2017 0525   CREATININE 1.54 (H) 08/03/2017 0525   CALCIUM 8.0 (L) 08/03/2017 0525   PROT 6.7 07/31/2017 1405   ALBUMIN 3.4 (L) 07/31/2017 1405   AST 39 07/31/2017 1405   ALT 50 07/31/2017 1405   ALKPHOS 84 07/31/2017 1405   BILITOT 0.5 07/31/2017 1405   GFRNONAA 36 (L) 08/03/2017 0525   GFRAA 41 (L) 08/03/2017  0525   Lipase     Component Value Date/Time   LIPASE 30 07/31/2017 1405       Studies/Results: [place] Abscess Drain  Result Date: 08/02/2017 INDICATION: Chronic fluid collection within the soft tissues of the ventral aspect the left mid hemiabdomen. Please perform ultrasound-guided aspiration for tissue diagnostic purposes. EXAM: ULTRASOUND GUIDED BODY WALL SEROMA ASPIRATION COMPARISON:  CT abdomen pelvis-07/31/2017; 05/08/2017; ultrasound-guided body wall fluid collection aspiration-02/17/2016 MEDICATIONS: The patient is currently admitted to the hospital and receiving intravenous antibiotics. The antibiotics were administered within an appropriate time frame prior to the initiation of the procedure. ANESTHESIA/SEDATION: None CONTRAST:  None COMPLICATIONS: None immediate. PROCEDURE: Informed written consent was obtained from the patient after a discussion of the risks, benefits and alternatives to treatment. The patient was placed supine on her hospital bed and ultrasound scanning of the ventral wall of the left mid hemiabdomen demonstrates a serpiginous (at least) 5.9 x 1.2 cm anechoic fluid collection within the subjacent subcutaneous tissues (image 5) compatible with the serpiginous fluid collection seen on preceding abdominal CT image 32, series 2). The procedure was planned. A timeout was performed prior to the initiation of the procedure. The skin overlying the ventral aspect the left mid hemiabdomen was prepped and draped in usual sterile fashion. The overlying soft tissues  were anesthetized with 1% lidocaine with epinephrine. The fluid collection was cannulated with an 18 gauge spinal needle. Multiple ultrasound images were saved for procedural documentation purposes. Next, approximately 30 cc of serous, straw-colored fluid was aspirated from the collection as the needle was slowly withdrawn. Postprocedural imaging demonstrates near complete resolution of the body wall seroma. A dressing was  placed. The patient tolerated the procedure well without immediate post procedural complication. IMPRESSION: Successful ultrasound-guided aspiration of approximately 30 cc of serous, straw-colored fluid from chronic fluid collection with the left mid hemiabdomen favored to represent a seroma. Samples were sent to the laboratory as requested by the ordering clinical team. Electronically Signed   By: Sandi Mariscal M.D.   On: 08/02/2017 12:41    Anti-infectives: Anti-infectives (From admission, onward)   Start     Dose/Rate Route Frequency Ordered Stop   08/01/17 1000  hydroxychloroquine (PLAQUENIL) tablet 200 mg     200 mg Oral Daily 08/01/17 0124     07/31/17 2245  cefTRIAXone (ROCEPHIN) 2 g in sodium chloride 0.9 % 100 mL IVPB     2 g 200 mL/hr over 30 Minutes Intravenous Daily at bedtime 07/31/17 2232 08/04/17 2159     Assessment/Plan Gastroenteritis  Recent Hx klebs UTI - Rocephin per primary  CKD IV Hx lupus  Anemia chronic disease   Fluid collection LLQ. 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 Phineas Douglas -  No signs or sxs of infection on exam -  Having bowel function and tolerating a diet -  No acute surigcal needs, should follow up with her surgeon Dr. Phineas Douglas at discharge   FEN: Clears, advance as tolerated from surgical perspective ID: Rocephin VTE: SCD's, Lovenox held 2/2 anemia     LOS: 0 days    Jill Alexanders , [facility] 08/03/2017, 8:57 AM Pager: [phone] Consults: [phone] Mon-Fri 7:00 am-4:30 pm Sat-Sun 7:00 am-11:30 am

## 2017-08-07 LAB — CULTURE, BODY FLUID W GRAM STAIN -BOTTLE
Culture: NO GROWTH
Special Requests: ADEQUATE

## 2017-12-15 ENCOUNTER — Encounter (HOSPITAL_BASED_OUTPATIENT_CLINIC_OR_DEPARTMENT_OTHER): Payer: Self-pay | Admitting: *Deleted

## 2017-12-15 ENCOUNTER — Emergency Department (HOSPITAL_BASED_OUTPATIENT_CLINIC_OR_DEPARTMENT_OTHER): Payer: Medicare HMO

## 2017-12-15 ENCOUNTER — Other Ambulatory Visit: Payer: Self-pay

## 2017-12-15 ENCOUNTER — Inpatient Hospital Stay (HOSPITAL_BASED_OUTPATIENT_CLINIC_OR_DEPARTMENT_OTHER)
Admission: EM | Admit: 2017-12-15 | Discharge: 2017-12-18 | DRG: 684 | Disposition: A | Discharge status: Home Health | Payer: Medicare HMO | Attending: Internal Medicine | Admitting: Internal Medicine

## 2017-12-15 DIAGNOSIS — N184 Chronic kidney disease, stage 4 (severe): Secondary | ICD-10-CM | POA: Diagnosis present

## 2017-12-15 DIAGNOSIS — Z833 Family history of diabetes mellitus: Secondary | ICD-10-CM

## 2017-12-15 DIAGNOSIS — M8700 Idiopathic aseptic necrosis of unspecified bone: Secondary | ICD-10-CM | POA: Diagnosis present

## 2017-12-15 DIAGNOSIS — Z905 Acquired absence of kidney: Secondary | ICD-10-CM | POA: Diagnosis not present

## 2017-12-15 DIAGNOSIS — M3214 Glomerular disease in systemic lupus erythematosus: Secondary | ICD-10-CM

## 2017-12-15 DIAGNOSIS — Z8249 Family history of ischemic heart disease and other diseases of the circulatory system: Secondary | ICD-10-CM

## 2017-12-15 DIAGNOSIS — E860 Dehydration: Secondary | ICD-10-CM | POA: Diagnosis present

## 2017-12-15 DIAGNOSIS — Z800 Family history of malignant neoplasm of digestive organs: Secondary | ICD-10-CM | POA: Diagnosis not present

## 2017-12-15 DIAGNOSIS — G894 Chronic pain syndrome: Secondary | ICD-10-CM | POA: Diagnosis present

## 2017-12-15 DIAGNOSIS — Z85528 Personal history of other malignant neoplasm of kidney: Secondary | ICD-10-CM | POA: Diagnosis not present

## 2017-12-15 DIAGNOSIS — N183 Chronic kidney disease, stage 3 unspecified: Secondary | ICD-10-CM | POA: Diagnosis present

## 2017-12-15 DIAGNOSIS — D638 Anemia in other chronic diseases classified elsewhere: Secondary | ICD-10-CM | POA: Diagnosis present

## 2017-12-15 DIAGNOSIS — I10 Essential (primary) hypertension: Secondary | ICD-10-CM | POA: Diagnosis present

## 2017-12-15 DIAGNOSIS — Z9081 Acquired absence of spleen: Secondary | ICD-10-CM | POA: Diagnosis not present

## 2017-12-15 DIAGNOSIS — Z6833 Body mass index (BMI) 33.0-33.9, adult: Secondary | ICD-10-CM | POA: Diagnosis not present

## 2017-12-15 DIAGNOSIS — R197 Diarrhea, unspecified: Secondary | ICD-10-CM | POA: Diagnosis present

## 2017-12-15 DIAGNOSIS — Z79899 Other long term (current) drug therapy: Secondary | ICD-10-CM | POA: Diagnosis not present

## 2017-12-15 DIAGNOSIS — N179 Acute kidney failure, unspecified: Secondary | ICD-10-CM | POA: Diagnosis present

## 2017-12-15 DIAGNOSIS — N390 Urinary tract infection, site not specified: Secondary | ICD-10-CM | POA: Diagnosis present

## 2017-12-15 DIAGNOSIS — N3000 Acute cystitis without hematuria: Secondary | ICD-10-CM | POA: Diagnosis not present

## 2017-12-15 DIAGNOSIS — C642 Malignant neoplasm of left kidney, except renal pelvis: Secondary | ICD-10-CM | POA: Diagnosis not present

## 2017-12-15 DIAGNOSIS — I129 Hypertensive chronic kidney disease with stage 1 through stage 4 chronic kidney disease, or unspecified chronic kidney disease: Secondary | ICD-10-CM | POA: Diagnosis present

## 2017-12-15 DIAGNOSIS — S301XXA Contusion of abdominal wall, initial encounter: Secondary | ICD-10-CM | POA: Diagnosis not present

## 2017-12-15 DIAGNOSIS — D649 Anemia, unspecified: Secondary | ICD-10-CM | POA: Diagnosis not present

## 2017-12-15 DIAGNOSIS — Z7952 Long term (current) use of systemic steroids: Secondary | ICD-10-CM | POA: Diagnosis not present

## 2017-12-15 DIAGNOSIS — D899 Disorder involving the immune mechanism, unspecified: Secondary | ICD-10-CM

## 2017-12-15 DIAGNOSIS — M329 Systemic lupus erythematosus, unspecified: Secondary | ICD-10-CM | POA: Diagnosis present

## 2017-12-15 DIAGNOSIS — Z801 Family history of malignant neoplasm of trachea, bronchus and lung: Secondary | ICD-10-CM | POA: Diagnosis not present

## 2017-12-15 DIAGNOSIS — D849 Immunodeficiency, unspecified: Secondary | ICD-10-CM | POA: Diagnosis present

## 2017-12-15 DIAGNOSIS — D631 Anemia in chronic kidney disease: Secondary | ICD-10-CM | POA: Diagnosis not present

## 2017-12-15 DIAGNOSIS — N189 Chronic kidney disease, unspecified: Secondary | ICD-10-CM | POA: Diagnosis present

## 2017-12-15 LAB — URINALYSIS, ROUTINE W REFLEX MICROSCOPIC
Bilirubin Urine: NEGATIVE
Glucose, UA: NEGATIVE mg/dL
Hgb urine dipstick: NEGATIVE
Ketones, ur: NEGATIVE mg/dL
Nitrite: NEGATIVE
Protein, ur: NEGATIVE mg/dL
Specific Gravity, Urine: 1.025 (ref 1.005–1.030)
pH: 6 (ref 5.0–8.0)

## 2017-12-15 LAB — COMPREHENSIVE METABOLIC PANEL
ALBUMIN: 4 g/dL (ref 3.5–5.0)
ALK PHOS: 70 U/L (ref 38–126)
ALT: 32 U/L (ref 0–44)
AST: 40 U/L (ref 15–41)
Anion gap: 11 (ref 5–15)
BUN: 32 mg/dL — ABNORMAL HIGH (ref 6–20)
CHLORIDE: 107 mmol/L (ref 98–111)
CO2: 19 mmol/L — AB (ref 22–32)
Calcium: 8.5 mg/dL — ABNORMAL LOW (ref 8.9–10.3)
Creatinine, Ser: 2.35 mg/dL — ABNORMAL HIGH (ref 0.44–1.00)
GFR calc Af Amer: 25 mL/min — ABNORMAL LOW (ref >60)
GFR calc non Af Amer: 21 mL/min — ABNORMAL LOW (ref >60)
GLUCOSE: 76 mg/dL (ref 70–99)
Potassium: 3.5 mmol/L (ref 3.5–5.1)
SODIUM: 137 mmol/L (ref 135–145)
Total Bilirubin: 0.6 mg/dL (ref 0.3–1.2)
Total Protein: 7 g/dL (ref 6.5–8.1)

## 2017-12-15 LAB — CBC
HEMATOCRIT: 37.2 % (ref 36.0–46.0)
HEMOGLOBIN: 11.1 g/dL — AB (ref 12.0–15.0)
MCH: 22.6 pg — AB (ref 26.0–34.0)
MCHC: 29.8 g/dL — ABNORMAL LOW (ref 30.0–36.0)
MCV: 75.6 fL — AB (ref 80.0–100.0)
NRBC: 0 % (ref 0.0–0.2)
PLATELETS: 205 10*3/uL (ref 150–400)
RBC: 4.92 MIL/uL (ref 3.87–5.11)
RDW: 16.3 % — ABNORMAL HIGH (ref 11.5–15.5)
WBC: 7.9 10*3/uL (ref 4.0–10.5)

## 2017-12-15 LAB — URINALYSIS, MICROSCOPIC (REFLEX)

## 2017-12-15 LAB — TSH: TSH: 5.899 u[IU]/mL — ABNORMAL HIGH (ref 0.350–4.500)

## 2017-12-15 LAB — OCCULT BLOOD X 1 CARD TO LAB, STOOL: Fecal Occult Bld: NEGATIVE

## 2017-12-15 LAB — LIPASE, BLOOD: Lipase: 28 U/L (ref 11–51)

## 2017-12-15 MED ORDER — SORBITOL 70 % SOLN: 30.0000 mL | Freq: Every day | Status: DC | PRN

## 2017-12-15 MED ORDER — ONDANSETRON HCL 4 MG/2ML IJ SOLN: 4.0000 mg | Freq: Four times a day (QID) | INTRAMUSCULAR | Status: DC | PRN

## 2017-12-15 MED ORDER — ACETAMINOPHEN 325 MG PO TABS: 650.0000 mg | ORAL_TABLET | Freq: Four times a day (QID) | ORAL | Status: DC | PRN

## 2017-12-15 MED ORDER — MORPHINE SULFATE (PF) 4 MG/ML IV SOLN
4.0000 mg | Freq: Once | INTRAVENOUS | Status: AC
  Administered 2017-12-15: 4 mg via INTRAVENOUS
  Filled 2017-12-15: qty 1

## 2017-12-15 MED ORDER — ONDANSETRON HCL 4 MG PO TABS: 4.0000 mg | ORAL_TABLET | Freq: Four times a day (QID) | ORAL | Status: DC | PRN

## 2017-12-15 MED ORDER — MORPHINE SULFATE (PF) 2 MG/ML IV SOLN
2.0000 mg | INTRAVENOUS | Status: DC | PRN
  Administered 2017-12-15 – 2017-12-17 (×9): 2 mg via INTRAVENOUS
  Filled 2017-12-15 (×9): qty 1

## 2017-12-15 MED ORDER — SODIUM CHLORIDE 0.9 % IV SOLN
INTRAVENOUS | Status: DC
  Administered 2017-12-15 – 2017-12-17 (×4): via INTRAVENOUS

## 2017-12-15 MED ORDER — DOCUSATE SODIUM 283 MG RE ENEM
1.0000 | ENEMA | RECTAL | Status: DC | PRN
  Filled 2017-12-15: qty 1

## 2017-12-15 MED ORDER — MYCOPHENOLATE MOFETIL 250 MG PO CAPS
500.0000 mg | ORAL_CAPSULE | Freq: Every day | ORAL | Status: DC
  Administered 2017-12-15 – 2017-12-18 (×4): 500 mg via ORAL
  Filled 2017-12-15 (×4): qty 2

## 2017-12-15 MED ORDER — HYDROXYZINE HCL 25 MG PO TABS
50.0000 mg | ORAL_TABLET | Freq: Three times a day (TID) | ORAL | Status: DC | PRN
  Administered 2017-12-17: 50 mg via ORAL
  Filled 2017-12-15: qty 2

## 2017-12-15 MED ORDER — PREDNISONE 20 MG PO TABS
40.0000 mg | ORAL_TABLET | Freq: Every day | ORAL | Status: DC
  Administered 2017-12-16: 40 mg via ORAL
  Filled 2017-12-15: qty 2

## 2017-12-15 MED ORDER — CAMPHOR-MENTHOL 0.5-0.5 % EX LOTN
1.0000 "application " | TOPICAL_LOTION | Freq: Three times a day (TID) | CUTANEOUS | Status: DC | PRN
  Filled 2017-12-15: qty 222

## 2017-12-15 MED ORDER — SODIUM CHLORIDE 0.9 % IV BOLUS
500.0000 mL | Freq: Once | INTRAVENOUS | Status: AC
  Administered 2017-12-15: 500 mL via INTRAVENOUS

## 2017-12-15 MED ORDER — NEPRO/CARBSTEADY PO LIQD
237.0000 mL | Freq: Two times a day (BID) | ORAL | Status: DC
  Filled 2017-12-15 (×2): qty 237

## 2017-12-15 MED ORDER — ZOLPIDEM TARTRATE 5 MG PO TABS
5.0000 mg | ORAL_TABLET | Freq: Every evening | ORAL | Status: DC | PRN
  Administered 2017-12-15: 5 mg via ORAL
  Filled 2017-12-15: qty 1

## 2017-12-15 MED ORDER — SODIUM CHLORIDE 0.9 % IV SOLN
1.0000 g | Freq: Once | INTRAVENOUS | Status: AC
  Administered 2017-12-15: 1 g via INTRAVENOUS
  Filled 2017-12-15: qty 10

## 2017-12-15 MED ORDER — SODIUM CHLORIDE 0.9 % IV SOLN: 1.0000 g | INTRAVENOUS | Status: DC

## 2017-12-15 MED ORDER — SENNOSIDES-DOCUSATE SODIUM 8.6-50 MG PO TABS: 1.0000 | ORAL_TABLET | Freq: Every evening | ORAL | Status: DC | PRN

## 2017-12-15 MED ORDER — SODIUM CHLORIDE 0.9 % IV SOLN
INTRAVENOUS | Status: DC
  Administered 2017-12-15: 18:00:00 via INTRAVENOUS

## 2017-12-15 MED ORDER — DULOXETINE HCL 60 MG PO CPEP
60.0000 mg | ORAL_CAPSULE | Freq: Every day | ORAL | Status: DC
  Administered 2017-12-16 – 2017-12-18 (×3): 60 mg via ORAL
  Filled 2017-12-15 (×3): qty 1

## 2017-12-15 MED ORDER — PREGABALIN 75 MG PO CAPS
150.0000 mg | ORAL_CAPSULE | Freq: Two times a day (BID) | ORAL | Status: DC
  Administered 2017-12-15 – 2017-12-16 (×2): 150 mg via ORAL
  Filled 2017-12-15 (×2): qty 2

## 2017-12-15 MED ORDER — HYDROXYCHLOROQUINE SULFATE 200 MG PO TABS
200.0000 mg | ORAL_TABLET | Freq: Every day | ORAL | Status: DC
  Administered 2017-12-15 – 2017-12-18 (×4): 200 mg via ORAL
  Filled 2017-12-15 (×4): qty 1

## 2017-12-15 MED ORDER — ACETAMINOPHEN 650 MG RE SUPP: 650.0000 mg | Freq: Four times a day (QID) | RECTAL | Status: DC | PRN

## 2017-12-15 MED ORDER — SODIUM CHLORIDE 0.9% FLUSH
3.0000 mL | Freq: Two times a day (BID) | INTRAVENOUS | Status: DC
  Administered 2017-12-17 – 2017-12-18 (×2): 3 mL via INTRAVENOUS

## 2017-12-15 MED ORDER — NEPRO/CARBSTEADY PO LIQD
237.0000 mL | Freq: Three times a day (TID) | ORAL | Status: DC | PRN
  Filled 2017-12-15: qty 237

## 2017-12-15 MED ORDER — CALCIUM CARBONATE ANTACID 1250 MG/5ML PO SUSP
500.0000 mg | Freq: Four times a day (QID) | ORAL | Status: DC | PRN
  Administered 2017-12-17: 500 mg via ORAL
  Filled 2017-12-15 (×2): qty 5

## 2017-12-15 MED ORDER — HYDROXYZINE HCL 25 MG PO TABS: 25.0000 mg | ORAL_TABLET | Freq: Three times a day (TID) | ORAL | Status: DC | PRN

## 2017-12-15 MED ORDER — SODIUM CHLORIDE 0.9 % IV BOLUS
1000.0000 mL | Freq: Once | INTRAVENOUS | Status: AC
  Administered 2017-12-15: 1000 mL via INTRAVENOUS

## 2017-12-15 MED ORDER — HEPARIN SODIUM (PORCINE) 5000 UNIT/ML IJ SOLN
5000.0000 [IU] | Freq: Three times a day (TID) | INTRAMUSCULAR | Status: DC
  Administered 2017-12-15 – 2017-12-18 (×8): 5000 [IU] via SUBCUTANEOUS
  Filled 2017-12-15 (×7): qty 1

## 2017-12-15 MED ORDER — HYDROCODONE-ACETAMINOPHEN 5-325 MG PO TABS
1.0000 | ORAL_TABLET | Freq: Three times a day (TID) | ORAL | Status: DC | PRN
  Administered 2017-12-16: 1 via ORAL
  Filled 2017-12-15 (×2): qty 1

## 2017-12-15 MED ORDER — SORBITOL 70 % SOLN: 30.0000 mL | Status: DC | PRN

## 2017-12-15 NOTE — ED Provider Notes (Signed)
[facility] EMERGENCY DEPARTMENT Provider Note   CSN: [id number] Arrival date & time: 12/15/17  1101     History   Chief Complaint Chief Complaint  Patient presents with  . Abdominal Pain    HPI Carolyn Zamora is a 62 y.o. female with history of lupus, renal cell carcinoma with left nephrectomy, AVN, stage IV chronic kidney disease who presents with a one-week history of abdominal pain and diarrhea.  Patient reported some darker stool, however she is unsure if it was black or dark red.  Patient's PCP told her to come here to rule out GI bleed.  Patient denies any nausea or vomiting.  She has had left sided abdominal pain that is severe.  She is not taking anything at home for symptoms.  Patient has had urinary frequency more than usual as well as suprapubic, left lower, and minimal left upper abdominal pain.  She denies any abnormal vaginal bleeding or discharge.  HPI  Past Medical History:  Diagnosis Date  . Abdominal wall hematoma 08/01/2017  . Anemia   . AVN (avascular necrosis of bone) ([place]) 08/01/2017  . CKD (chronic kidney disease), stage IV ([place])   . History of dental abscess with sepsis 2017 08/02/2017  . History of kidney cancer   . Hypertension   . Lupus ([place])   . 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 Ventral hernia without obstruction or gangrene 10/24/2015    Patient Active Problem List   Diagnosis Date Noted  . AKI (acute kidney injury) ([place]) 12/15/2017  . Diarrhea 12/15/2017  . Dehydration 12/15/2017  . Anemia 12/15/2017  . Renal failure (ARF), acute on chronic (HCC) 12/15/2017  . History of dental abscess with sepsis 2017 08/02/2017  . History of methicillin resistant staphylococcus aureus (MRSA) 08/02/2017  . AVN (avascular necrosis of bone) ([place]) 08/01/2017  . Intractable vomiting 07/31/2017  . Sepsis ([place]) 07/23/2017  . Abdominal wall seroma - chronic from prior [facility] repair 2017 03/03/2016  . Immunocompromised state  ([place]) 02/22/2016  . Low back pain 02/20/2016  . Pressure injury of skin 12/28/2015  . Acute renal failure (ARF) ([place]) 12/27/2015  . UTI (urinary tract infection) 12/27/2015  . Hyperkalemia 12/27/2015  . Hypertension   . Acute renal failure superimposed on stage 3 chronic kidney disease ([place]) 08/03/2015  . Systemic lupus erythematosus ([place]) 03/27/2015  . Acquired absence of spleen 06/21/2014  . 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 Chronic pain associated with significant psychosocial dysfunction 09/01/2013  . Chronic pain syndrome 09/01/2013  . Chronic kidney disease, stage III (moderate) ([place]) 08/17/2013    Past Surgical History:  Procedure Laterality Date  . INCISIONAL HERNIA REPAIR  11/19/2015   [facility].  Dr Phineas Douglas.  left VWH repair w mesh  . MULTIPLE TOOTH EXTRACTIONS    . NEPHRECTOMY    . ORIF ANKLE FRACTURE  11/16/2015   Max Cohen, 2017  . SPLENECTOMY       OB History   None      Home Medications    Prior to Admission medications   Medication Sig Start Date End Date Taking? Authorizing Provider  amLODipine (NORVASC) 10 MG tablet Take 10 mg by mouth daily. 07/23/17  Yes [provider]  DULoxetine (CYMBALTA) 30 MG capsule Take 60 mg by mouth daily.  10/28/17  Yes [provider]  furosemide (LASIX) 40 MG tablet Take 40 mg by mouth daily. 07/02/17  Yes [provider]  HYDROcodone-acetaminophen (NORCO/VICODIN) 5-325  MG tablet Take 1 tablet by mouth every 8 (eight) hours as needed for pain. 12/15/15  Yes [provider]  hydroxychloroquine (PLAQUENIL) 200 MG tablet Take 200 mg by mouth daily. 12/15/15  Yes [provider]  hydrOXYzine (ATARAX/VISTARIL) 50 MG tablet Take 50 mg by mouth 3 (three) times daily as needed for nausea.  07/06/17  Yes [provider]  mycophenolate (CELLCEPT) 500 MG tablet Take 500 mg by mouth daily. 03/18/17  Yes [provider]  predniSONE (DELTASONE) 10 MG  tablet Take 10 mg by mouth daily with breakfast.   Yes [provider]  pregabalin (LYRICA) 150 MG capsule Take 150 mg by mouth 2 (two) times daily.   Yes [provider]  traZODone (DESYREL) 50 MG tablet Take 50 mg by mouth daily. 06/29/17  Yes [provider]  Vitamin D, Ergocalciferol, (DRISDOL) 50000 units CAPS capsule Take 1 capsule by mouth once a week. 07/01/17  Yes [provider]    Family History Family History  Problem Relation Age of Onset  . Hypertension Mother   . Diabetes Mellitus I Mother   . Lung cancer Father   . Hypertension Sister   . Colon cancer Brother     Social History Social History   Tobacco Use  . Smoking status: Never Smoker  . Smokeless tobacco: Never Used  Substance Use Topics  . Alcohol use: No  . Drug use: No     Allergies   Patient has no known allergies.   Review of Systems Review of Systems  Constitutional: Negative for chills and fever.  HENT: Negative for facial swelling and sore throat.   Respiratory: Negative for shortness of breath.   Cardiovascular: Negative for chest pain.  Gastrointestinal: Positive for abdominal pain, blood in stool and diarrhea. Negative for nausea and vomiting.  Genitourinary: Negative for dysuria.  Musculoskeletal: Negative for back pain.  Skin: Negative for rash and wound.  Neurological: Negative for headaches.  Psychiatric/Behavioral: The patient is not nervous/anxious.      Physical Exam Updated Vital Signs BP (!) 147/69 (BP Location: Left Wrist)   Pulse (!) 102   Temp 98.4 F (36.9 C) (Oral)   Resp 18   Ht 5\' 2"  (1.575 m)   Wt 77.1 kg   SpO2 100%   BMI 31.09 kg/m   Physical Exam  Constitutional: She appears well-developed and well-nourished. No distress.  HENT:  Head: Normocephalic and atraumatic.  Mouth/Throat: Oropharynx is clear and moist. No oropharyngeal exudate.  Eyes: Pupils are equal, round, and reactive to light. Conjunctivae are normal.  Right eye exhibits no discharge. Left eye exhibits no discharge. No scleral icterus.  Neck: Normal range of motion. Neck supple. No thyromegaly present.  Cardiovascular: Normal rate, regular rhythm, normal heart sounds and intact distal pulses. Exam reveals no gallop and no friction rub.  No murmur heard. Pulmonary/Chest: Effort normal and breath sounds normal. No stridor. No respiratory distress. She has no wheezes. She has no rales.  Abdominal: Soft. Bowel sounds are normal. She exhibits no distension. There is tenderness in the epigastric area, left upper quadrant and left lower quadrant. There is no rebound, no guarding and no CVA tenderness.  Genitourinary: Rectal exam shows external hemorrhoid. Rectal exam shows anal tone normal and guaiac negative stool.  Musculoskeletal: She exhibits no edema.  Lymphadenopathy:    She has no cervical adenopathy.  Neurological: She is alert. Coordination normal.  Skin: Skin is warm and dry. No rash noted. She is not diaphoretic. No  pallor.  Psychiatric: She has a normal mood and affect.  Nursing note and vitals reviewed.    ED Treatments / Results  Labs (all labs ordered are listed, but only abnormal results are displayed) Labs Reviewed  COMPREHENSIVE METABOLIC PANEL - Abnormal; Notable for the following components:      Result Value   CO2 19 (*)    BUN 32 (*)    Creatinine, Ser 2.35 (*)    Calcium 8.5 (*)    GFR calc non Af Amer 21 (*)    GFR calc Af Amer 25 (*)    All other components within normal limits  CBC - Abnormal; Notable for the following components:   Hemoglobin 11.1 (*)    MCV 75.6 (*)    MCH 22.6 (*)    MCHC 29.8 (*)    RDW 16.3 (*)    All other components within normal limits  URINALYSIS, ROUTINE W REFLEX MICROSCOPIC - Abnormal; Notable for the following components:   Leukocytes, UA MODERATE (*)    All other components within normal limits  URINALYSIS, MICROSCOPIC (REFLEX) - Abnormal; Notable for the following components:    Bacteria, UA MANY (*)    All other components within normal limits  URINE CULTURE  CULTURE, BLOOD (ROUTINE X 2)  CULTURE, BLOOD (ROUTINE X 2)  C DIFFICILE QUICK SCREEN W PCR REFLEX  GASTROINTESTINAL PANEL BY PCR, STOOL (REPLACES STOOL CULTURE)  LIPASE, BLOOD  OCCULT BLOOD X 1 CARD TO LAB, STOOL  TSH  CBC WITH DIFFERENTIAL/PLATELET  SODIUM, URINE, RANDOM  CREATININE, URINE, RANDOM    EKG None  Radiology Ct Abdomen Pelvis Wo Contrast  Result Date: 12/15/2017 CLINICAL DATA:  Abdominal pain and diarrhea for a week. Black stool. Splenectomy, renal cell carcinoma s/p left nephrectomy, Lupus, HTN, CKD, AVN, ventral hernia. EXAM: CT ABDOMEN AND PELVIS WITHOUT CONTRAST TECHNIQUE: Multidetector CT imaging of the abdomen and pelvis was performed following the standard protocol without IV contrast. COMPARISON:  07/31/2017 FINDINGS: Lower chest: Descending thoracic aortic atherosclerotic calcification. The left pleural effusion has resolved but there is scarring and some volume loss at the left lung base with associated mild nodularity along the scarring. Mild pleural thickening or nodularity inferiorly along the right lower lobe side of the right major fissure, approximately 0.8 cm in thickness, stable from 07/31/2017. Hepatobiliary: Streak artifact from the patient's right arm positioning partially obscures the liver. Borderline distended gallbladder. Pancreas: Unremarkable Spleen: Regenerative splenic tissue noted similar to prior. Adrenals/Urinary Tract: Stable nodularity along the nephrectomy site measuring 2.3 by 0.9 cm. Adrenal glands unremarkable. No right-sided urinary tract calculi. No hydronephrosis. Stomach/Bowel: No findings of diverticulosis or appreciable diverticulitis. The cecum is mobile and in the left abdomen Vascular/Lymphatic: Aortoiliac atherosclerotic vascular disease. No appreciable adenopathy. Reproductive: Convex lower uterine contour possibly from fibroid but technically  nonspecific. Calcifications along the right adnexa possibly from a subserosal fibroid or in the ovary. These are unchanged. Other: Superficial anterior abdominal wall fluid collection measures 8.5 by 1.7 cm on image 32/2, formerly 9.8 by 2.3 cm. Musculoskeletal: Bilateral femoral head avascular necrosis without appreciable collapse. Degenerative loss of articular space in both hips. 13 mm of degenerative anterolisthesis at L4-5 with partial collapse of the L4 and L5 endplates leading to mild vertebral interdigitation on image 57/3. 5 mm degenerative retrolisthesis at L5-S1. There is resulting foraminal impingement at the L4-5 level, left greater than right. IMPRESSION: 1. A cause for blood in the stool and diarrhea is not identified on today's noncontrast CT examination. 2. Mobile  cecum in the left upper quadrant. 3. Reduced size of the left anterior abdominal wall fluid collection. 4. Resolved left pleural effusion although there is continued scarring in the left lower lobe. Minimal nodularity along this scarring and along the right lower lobe hemidiaphragmatic margin with the major fissure, nonspecific. 5. Left nephrectomy with chronic oval-shaped density along the nephrectomy bed which may simply be postoperative findings. 6. Prior splenectomy with regenerative splenic tissues. 7.  Aortic Atherosclerosis (ICD10-I70.0). 8. Suspected uterine fibroids. 9. Bilateral femoral head AVN. 10. Stable appearance of subluxations and endplate collapse at L4 and L5 with resulting considerable impingement at L4-5. Electronically Signed   By: Van Clines M.D.   On: 12/15/2017 13:09    Procedures Procedures (including critical care time)  Medications Ordered in ED Medications  HYDROcodone-acetaminophen (NORCO/VICODIN) 5-325 MG per tablet 1 tablet (has no administration in time range)  hydroxychloroquine (PLAQUENIL) tablet 200 mg (has no administration in time range)  DULoxetine (CYMBALTA) DR capsule 60 mg (has no  administration in time range)  hydrOXYzine (ATARAX/VISTARIL) tablet 50 mg (has no administration in time range)  mycophenolate (CELLCEPT) capsule 500 mg (has no administration in time range)  pregabalin (LYRICA) capsule 150 mg (has no administration in time range)  heparin injection 5,000 Units (has no administration in time range)  sodium chloride flush (NS) 0.9 % injection 3 mL (has no administration in time range)  senna-docusate (Senokot-S) tablet 1 tablet (has no administration in time range)  predniSONE (DELTASONE) tablet 40 mg (has no administration in time range)  morphine 2 MG/ML injection 2 mg (has no administration in time range)  cefTRIAXone (ROCEPHIN) 1 g in sodium chloride 0.9 % 100 mL IVPB (has no administration in time range)  0.9 %  sodium chloride infusion (has no administration in time range)  acetaminophen (TYLENOL) tablet 650 mg (has no administration in time range)    Or  acetaminophen (TYLENOL) suppository 650 mg (has no administration in time range)  zolpidem (AMBIEN) tablet 5 mg (has no administration in time range)  sorbitol 70 % solution 30 mL (has no administration in time range)  docusate sodium (ENEMEEZ) enema 283 mg (has no administration in time range)  ondansetron (ZOFRAN) tablet 4 mg (has no administration in time range)    Or  ondansetron (ZOFRAN) injection 4 mg (has no administration in time range)  camphor-menthol (SARNA) lotion 1 application (has no administration in time range)    And  hydrOXYzine (ATARAX/VISTARIL) tablet 25 mg (has no administration in time range)  calcium carbonate (dosed in mg elemental calcium) suspension 500 mg of elemental calcium (has no administration in time range)  feeding supplement (NEPRO CARB STEADY) liquid 237 mL (has no administration in time range)  sodium chloride 0.9 % bolus 500 mL ( Intravenous Stopped 12/15/17 1340)  morphine 4 MG/ML injection 4 mg (4 mg Intravenous Given 12/15/17 1213)  morphine 4 MG/ML injection 4  mg (4 mg Intravenous Given 12/15/17 1345)  sodium chloride 0.9 % bolus 1,000 mL (0 mLs Intravenous Stopped 12/15/17 1501)  cefTRIAXone (ROCEPHIN) 1 g in sodium chloride 0.9 % 100 mL IVPB (0 g Intravenous Stopped 12/15/17 1601)  morphine 4 MG/ML injection 4 mg (4 mg Intravenous Given 12/15/17 1525)     Initial Impression / Assessment and Plan / ED Course  I have reviewed the triage vital signs and the nursing notes.  Pertinent labs & imaging results that were available during my care of the patient were reviewed by me and considered in my medical  decision making (see chart for details).     Patient presenting with left lower and suprapubic abdominal pain.  She has also has urinary frequency.  Patient found to have moderate leukocytes, many bacteria, 21-50 WBCs with white blood cell clumps.  Suspect UTI.  Patient also has significant AKI in the setting of singular kidney.  Patient given fluids in the ED and Rocephin initiated.  Otherwise, CT abdomen pelvis is negative for acute findings.  Labs are otherwise unremarkable, except for hemoglobin 11.1.  I discussed patient case with Dr. Wynelle Cleveland with [facility] who accepts patient for observation at [facility].  I appreciate her assistance with the patient.  Patient also evaluated by my attending, Dr. Tyrone Nine, who guided the patient's management and agrees with plan.  Final Clinical Impressions(s) / ED Diagnoses   Final diagnoses:  ARF (acute renal failure) [facility])    ED Discharge Orders    None       Frederica Kuster, PA-C 12/15/17 [place], DO 12/17/17 2300

## 2017-12-15 NOTE — ED Triage Notes (Signed)
Abdominal pain and diarrhea for a week. She was seen by her MD and told him her stool was black. She was told to come here to r/o GI bleed.

## 2017-12-15 NOTE — ED Notes (Signed)
Report given to floor nurse at [address], [place] , and [facility] as well.

## 2017-12-15 NOTE — Progress Notes (Signed)
Called by ED for direct admit.  62 y/o female with renal cell cancer with L nephrectomy, Lupus, CKD 3 (not 4 as noted in chart) & Gout.  Having diarrhea, dark stool, left sided abdominal pain. Came to r/o GI bleed. Hemoccult neg. No diarrhea in the ED.  Has AKI. On Lasix at home as well.  Received 1500 cc NS.  UA + for UTI. No fever or leukocytosis. CT abd pelvis unrevealing. I have asked that a culture be sent but unless she is having symptoms of a UTI, an antibiotics should not be started. Her pain is in the left where she has no kidney and likely is GI related.  Last admitted 07/23/17- 07/29/17  for vomiting, dysuria, fever 103, wBC 13.4 and treated for Klebsiella UTI/ bacteremia.  Place in observation to med/surg bed.   Debbe Odea, MD

## 2017-12-15 NOTE — H&P (Signed)
History and Physical    Carolyn Zamora SWN:[id number] DOB: 1955/04/08 DOA: 12/15/2017  PCP: Beckie Salts, MD  Patient coming from: Home  I have personally briefly reviewed patient's old medical records in [facility]  Chief Complaint: Abdominal pain, worsening diarrhea.  HPI: Carolyn Zamora is a 62 y.o. female with medical history significant of chronic kidney disease stage III, history of renal cell carcinoma status post left nephrectomy, history of vitamin B12 deficiency, history of splenectomy, chronic pain, morbid obesity, thrombocytopenia, lupus, chronic immunosuppressive medications of CellCept, hydroxychloroquine, chronic prednisone presenting to [place] from PCPs office with a one-week history of worsening black loose stools, generalized weakness, chronic left-sided abdominal pain which has worsened.  Patient denies any fevers, no chills, no chest pain, no constipation, no nausea, no vomiting, no hematemesis, no hematochezia.  Patient does endorse some shortness of breath.  Patient denies any recent use of antibiotics.  No recent change in her diet.  No dysuria.  No change in chronic blurry vision.  Patient does endorse ongoing diarrhea for 2 months that has since gotten worse over the past week.  Patient does endorse compliance with all her medications.  Patient was seen in the ED at the [facility].   ED Course: comprehensive metabolic profile obtained had a creatinine of 2.35, potassium of 3.5, bicarb of 19 otherwise was within normal limits.  CBC done had a hemoglobin of 11.1 otherwise was within normal limits.  Urinalysis done was moderate leukocytes, nitrite negative, many bacteria, present WBC clumps, 21-50 WBCs.  Urine cultures pending.  Patient given a dose of IV Rocephin.  Hospitalist were called to admit the patient for further evaluation and management.  Review of Systems: As per HPI otherwise 10 point review of systems negative.   Past  Medical History:  Diagnosis Date  . Abdominal wall hematoma 08/01/2017  . Anemia   . AVN (avascular necrosis of bone) ([place]) 08/01/2017  . CKD (chronic kidney disease), stage IV ([place]yn Zamora)   . History of dental abscess with sepsis 2017 08/02/2017  . History of kidney cancer   . Hypertension   . Lupus ([place]yn Zamora)   . 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 Ventral hernia without obstruction or gangrene 10/24/2015    Past Surgical History:  Procedure Laterality Date  . INCISIONAL HERNIA REPAIR  11/19/2015   [facility].  Dr Phineas Douglas.  left VWH repair w mesh  . MULTIPLE TOOTH EXTRACTIONS    . NEPHRECTOMY    . ORIF ANKLE FRACTURE  11/16/2015   Carolyn Zamora, 2017  . SPLENECTOMY       reports that she has never smoked. She has never used smokeless tobacco. She reports that she does not drink alcohol or use drugs.  No Known Allergies  Family History  Problem Relation Age of Onset  . Hypertension Mother   . Diabetes Mellitus I Mother   . Lung cancer Father   . Hypertension Sister   . Colon cancer Brother    Mother deceased age 51 with complications of diabetes.  Father deceased age 34 from lung cancer.  Prior to Admission medications   Medication Sig Start Date End Date Taking? Authorizing Provider  amLODipine (NORVASC) 10 MG tablet Take 10 mg by mouth daily. 07/23/17  Yes [provider]  DULoxetine (CYMBALTA) 30 MG capsule Take 60 mg by mouth daily.  10/28/17  Yes [provider]  furosemide (LASIX) 40 MG tablet Take 40 mg by mouth daily. 07/02/17  Yes [provider]  HYDROcodone-acetaminophen (NORCO/VICODIN) 5-325 MG tablet Take 1 tablet by mouth every 8 (eight) hours as needed for pain. 12/15/15  Yes [provider]  hydroxychloroquine (PLAQUENIL) 200 MG tablet Take 200 mg by mouth daily. 12/15/15  Yes [provider]  hydrOXYzine (ATARAX/VISTARIL) 50 MG tablet Take 50 mg by mouth 3 (three) times daily as needed for nausea.  07/06/17   Yes [provider]  mycophenolate (CELLCEPT) 500 MG tablet Take 500 mg by mouth daily. 03/18/17  Yes [provider]  predniSONE (DELTASONE) 10 MG tablet Take 10 mg by mouth daily with breakfast.   Yes [provider]  pregabalin (LYRICA) 150 MG capsule Take 150 mg by mouth 2 (two) times daily.   Yes [provider]  traZODone (DESYREL) 50 MG tablet Take 50 mg by mouth daily. 06/29/17  Yes [provider]  Vitamin D, Ergocalciferol, (DRISDOL) 50000 units CAPS capsule Take 1 capsule by mouth once a week. 07/01/17  Yes [provider]    Physical Exam: Vitals:   12/15/17 1112 12/15/17 1314 12/15/17 1605 12/15/17 1712  BP: 140/72 (!) 148/65 135/70 (!) 118/93  Pulse: (!) 103 74 (!) 110 (!) 105  Resp: 20 20 20 20   Temp: 98.1 F (36.7 C)   98.4 F (36.9 C)  TempSrc: Oral   Oral  SpO2: 98% 100% 97% 100%  Weight:      Height:        Constitutional: NAD, calm, comfortable Vitals:   12/15/17 1112 12/15/17 1314 12/15/17 1605 12/15/17 1712  BP: 140/72 (!) 148/65 135/70 (!) 118/93  Pulse: (!) 103 74 (!) 110 (!) 105  Resp: 20 20 20 20   Temp: 98.1 F (36.7 C)   98.4 F (36.9 C)  TempSrc: Oral   Oral  SpO2: 98% 100% 97% 100%  Weight:      Height:       Eyes: PERRLA, lids and conjunctivae normal ENMT: Mucous membranes are dry. Posterior pharynx clear of any exudate or lesions.Normal dentition.  Neck: normal, supple, no masses, no thyromegaly Respiratory: clear to auscultation bilaterally, no wheezing, no crackles. Normal respiratory effort. No accessory muscle use.  Cardiovascular: Regular rate and rhythm, no murmurs / rubs / gallops. No extremity edema. 2+ pedal pulses. No carotid bruits.  Abdomen: Soft, nondistended, positive bowel sounds.  Exquisite tenderness to palpation in the suprapubic region and bilateral lower quadrants.  Some diffuse less upper abdominal tenderness to palpation.  Musculoskeletal: no clubbing / cyanosis. No joint  deformity upper and lower extremities. Good ROM, no contractures. Normal muscle tone.  Skin: no rashes, lesions, ulcers. No induration Neurologic: CN 2-12 grossly intact. Sensation intact, DTR normal. Strength 5/5 in all 4.  Psychiatric: Normal judgment and insight. Alert and oriented x 3. Normal mood.   Labs on Admission: I have personally reviewed following labs and imaging studies  CBC: Recent Labs  Lab 12/15/17 1134  WBC 7.9  HGB 11.1*  HCT 37.2  MCV 75.6*  PLT 130   Basic Metabolic Panel: Recent Labs  Lab 12/15/17 1134  NA 137  K 3.5  CL 107  CO2 19*  GLUCOSE 76  BUN 32*  CREATININE 2.35*  CALCIUM 8.5*   GFR: Estimated Creatinine Clearance: 24.5 mL/min (A) (by C-G formula based on SCr of 2.35 mg/dL (H)). Liver Function Tests: Recent Labs  Lab 12/15/17 1134  AST 40  ALT 32  ALKPHOS 70  BILITOT 0.6  PROT 7.0  ALBUMIN 4.0   Recent Labs  Lab 12/15/17 1134  LIPASE 28   No results for input(s): AMMONIA in the last 168 hours. Coagulation Profile: No results for input(s): INR, PROTIME in the last 168 hours. Cardiac Enzymes: No results for input(s): CKTOTAL, CKMB, CKMBINDEX, TROPONINI in the last 168 hours. BNP (last 3 results) No results for input(s): PROBNP in the last 8760 hours. HbA1C: No results for input(s): HGBA1C in the last 72 hours. CBG: No results for input(s): GLUCAP in the last 168 hours. Lipid Profile: No results for input(s): CHOL, HDL, LDLCALC, TRIG, CHOLHDL, LDLDIRECT in the last 72 hours. Thyroid Function Tests: No results for input(s): TSH, T4TOTAL, FREET4, T3FREE, THYROIDAB in the last 72 hours. Anemia Panel: No results for input(s): VITAMINB12, FOLATE, FERRITIN, TIBC, IRON, RETICCTPCT in the last 72 hours. Urine analysis:    Component Value Date/Time   COLORURINE YELLOW 12/15/2017 1424   APPEARANCEUR CLEAR 12/15/2017 1424   LABSPEC 1.025 12/15/2017 1424   PHURINE 6.0 12/15/2017 1424   GLUCOSEU NEGATIVE 12/15/2017 1424   HGBUR  NEGATIVE 12/15/2017 [place] 12/15/2017 [place] 12/15/2017 [place] 12/15/2017 1424   NITRITE NEGATIVE 12/15/2017 1424   LEUKOCYTESUR MODERATE (A) 12/15/2017 1424    Radiological Exams on Admission: Ct Abdomen Pelvis Wo Contrast  Result Date: 12/15/2017 CLINICAL DATA:  Abdominal pain and diarrhea for a week. Black stool. Splenectomy, renal cell carcinoma s/p left nephrectomy, Lupus, HTN, CKD, AVN, ventral hernia. EXAM: CT ABDOMEN AND PELVIS WITHOUT CONTRAST TECHNIQUE: Multidetector CT imaging of the abdomen and pelvis was performed following the standard protocol without IV contrast. COMPARISON:  07/31/2017 FINDINGS: Lower chest: Descending thoracic aortic atherosclerotic calcification. The left pleural effusion has resolved but there is scarring and some volume loss at the left lung base with associated mild nodularity along the scarring. Mild pleural thickening or nodularity inferiorly along the right lower lobe side of the right major fissure, approximately 0.8 cm in thickness, stable from 07/31/2017. Hepatobiliary: Streak artifact from the patient's right arm positioning partially obscures the liver. Borderline distended gallbladder. Pancreas: Unremarkable Spleen: Regenerative splenic tissue noted similar to prior. Adrenals/Urinary Tract: Stable nodularity along the nephrectomy site measuring 2.3 by 0.9 cm. Adrenal glands unremarkable. No right-sided urinary tract calculi. No hydronephrosis. Stomach/Bowel: No findings of diverticulosis or appreciable diverticulitis. The cecum is mobile and in the left abdomen Vascular/Lymphatic: Aortoiliac atherosclerotic vascular disease. No appreciable adenopathy. Reproductive: Convex lower uterine contour possibly from fibroid but technically nonspecific. Calcifications along the right adnexa possibly from a subserosal fibroid or in the ovary. These are unchanged. Other: Superficial anterior abdominal wall fluid  collection measures 8.5 by 1.7 cm on image 32/2, formerly 9.8 by 2.3 cm. Musculoskeletal: Bilateral femoral head avascular necrosis without appreciable collapse. Degenerative loss of articular space in both hips. 13 mm of degenerative anterolisthesis at L4-5 with partial collapse of the L4 and L5 endplates leading to mild vertebral interdigitation on image 57/3. 5 mm degenerative retrolisthesis at L5-S1. There is resulting foraminal impingement at the L4-5 level, left greater than right. IMPRESSION: 1. A cause for blood in the stool and diarrhea is not identified on today's noncontrast CT examination. 2. Mobile cecum in the left upper quadrant. 3. Reduced size of the left anterior abdominal wall fluid collection. 4. Resolved left pleural effusion although there is continued scarring in the left lower lobe. Minimal nodularity along this scarring and along the right lower lobe hemidiaphragmatic margin with the major fissure, nonspecific. 5. Left nephrectomy with chronic oval-shaped density along the  nephrectomy bed which may simply be postoperative findings. 6. Prior splenectomy with regenerative splenic tissues. 7.  Aortic Atherosclerosis (ICD10-I70.0). 8. Suspected uterine fibroids. 9. Bilateral femoral head AVN. 10. Stable appearance of subluxations and endplate collapse at L4 and L5 with resulting considerable impingement at L4-5. Electronically Signed   By: Van Clines M.D.   On: 12/15/2017 13:09    EKG: Not done  Assessment/Plan Principal Problem:   Acute renal failure superimposed on stage 3 chronic kidney disease (HCC) Active Problems:   UTI (urinary tract infection)   Hypertension   Systemic lupus erythematosus ([place])   Abdominal wall seroma - chronic from prior [facility] repair 2017   AVN (avascular necrosis of bone) (HCC)   Chronic pain associated with significant psychosocial dysfunction   Immunocompromised state ([place])   Chronic pain syndrome   Renal cell carcinoma of left kidney s/p  nephrectomy   AKI (acute kidney injury) ([place])   Diarrhea   Dehydration   Anemia    1 acute renal failure on chronic kidney disease stage III Baseline creatinine approximately 1.5.  Patient presented with acute renal failure on chronic kidney disease stage III with a creatinine of 2.35 on presentation.  Likely secondary to a prerenal azotemia secondary to GI losses from diarrhea and on Lasix.  Patient status post left nephrectomy secondary to renal cell carcinoma.  Urinalysis done protein negative, specific gravity 1.025.  Urinalysis concerning for UTI.  Urine cultures pending.  Check a urine sodium, urine creatinine.  Check a renal ultrasound.  Give a 500 cc bolus of normal saline and placed on normal saline at 75 cc/h.  Due to patient's history of renal cell carcinoma status post left nephrectomy with worsening renal function will consult with nephrology for further evaluation and management.  Hold Lasix.  Follow.  2.  Dehydration Gentle hydration with IV fluids.  Follow.  3.  Diarrhea Questionable etiology.  Patient on immunosuppressive medications of CellCept, prednisone, hydroxychloroquine.  We will check a C. difficile PCR.  Check a GI pathogen panel.  IV fluids.  Supportive care.  4.  Anemia Likely anemia of chronic disease.  No overt bleeding.  Follow H&H.  5.  Chronic pain syndrome Resume home regimen of Vicodin.  IV morphine as needed.  Outpatient follow-up.  6.  Urinary tract infection Urinalysis consistent with a UTI.  Patient with exquisite significant suprapubic abdominal pain.  Patient on chronic immunosuppressive medications.  Check blood cultures x2.  Urine cultures pending.  Place empirically on IV Rocephin.  Follow.  7.  History of lupus Continue home regimen of CellCept, hydroxychloroquine.  Increase home dose prednisone to 60 mg daily as patient presented with an acute infection.  Outpatient follow-up.  8.  History of renal cell carcinoma status post left  nephrectomy Outpatient follow-up.  9.  Hypertension Hold antihypertensive medications.  Gentle hydration with IV fluids.  Follow.  DVT prophylaxis: Heparin Code Status: Full Family Communication: Updated patient and family at bedside. Disposition Plan: Likely home when clinically improved and renal function back to baseline. Consults called: Nephrology to see in the a.m. Admission status: Admit to inpatient.   Irine Seal MD [facility] Pager [phone]  If 7PM-7AM, please contact night-coverage [email or web address] Password [facility]  12/15/2017, 6:53 PM

## 2017-12-16 ENCOUNTER — Inpatient Hospital Stay (HOSPITAL_COMMUNITY): Payer: Medicare HMO

## 2017-12-16 DIAGNOSIS — N183 Chronic kidney disease, stage 3 (moderate): Secondary | ICD-10-CM

## 2017-12-16 DIAGNOSIS — N179 Acute kidney failure, unspecified: Principal | ICD-10-CM

## 2017-12-16 DIAGNOSIS — S301XXA Contusion of abdominal wall, initial encounter: Secondary | ICD-10-CM

## 2017-12-16 DIAGNOSIS — M8700 Idiopathic aseptic necrosis of unspecified bone: Secondary | ICD-10-CM

## 2017-12-16 LAB — BASIC METABOLIC PANEL
ANION GAP: 8 (ref 5–15)
BUN: 25 mg/dL — ABNORMAL HIGH (ref 6–20)
CHLORIDE: 114 mmol/L — AB (ref 98–111)
CO2: 19 mmol/L — AB (ref 22–32)
CREATININE: 2.13 mg/dL — AB (ref 0.44–1.00)
Calcium: 8 mg/dL — ABNORMAL LOW (ref 8.9–10.3)
GFR calc non Af Amer: 24 mL/min — ABNORMAL LOW (ref >60)
GFR, EST AFRICAN AMERICAN: 28 mL/min — AB (ref >60)
GLUCOSE: 131 mg/dL — AB (ref 70–99)
Potassium: 3.9 mmol/L (ref 3.5–5.1)
SODIUM: 141 mmol/L (ref 135–145)

## 2017-12-16 LAB — CBC WITH DIFFERENTIAL/PLATELET
ABS IMMATURE GRANULOCYTES: 0.02 10*3/uL (ref 0.00–0.07)
Basophils Absolute: 0 10*3/uL (ref 0.0–0.1)
Basophils Relative: 1 %
EOS PCT: 2 %
Eosinophils Absolute: 0.1 10*3/uL (ref 0.0–0.5)
HEMATOCRIT: 31.6 % — AB (ref 36.0–46.0)
HEMOGLOBIN: 9.2 g/dL — AB (ref 12.0–15.0)
Immature Granulocytes: 0 %
LYMPHS ABS: 1.9 10*3/uL (ref 0.7–4.0)
LYMPHS PCT: 40 %
MCH: 22.2 pg — AB (ref 26.0–34.0)
MCHC: 29.1 g/dL — AB (ref 30.0–36.0)
MCV: 76.3 fL — AB (ref 80.0–100.0)
MONO ABS: 0.7 10*3/uL (ref 0.1–1.0)
MONOS PCT: 14 %
Neutro Abs: 2.1 10*3/uL (ref 1.7–7.7)
Neutrophils Relative %: 43 %
Platelets: 175 10*3/uL (ref 150–400)
RBC: 4.14 MIL/uL (ref 3.87–5.11)
RDW: 16.7 % — ABNORMAL HIGH (ref 11.5–15.5)
WBC: 4.8 10*3/uL (ref 4.0–10.5)
nRBC: 0.4 % — ABNORMAL HIGH (ref 0.0–0.2)

## 2017-12-16 LAB — CREATININE, URINE, RANDOM: Creatinine, Urine: 168.64 mg/dL

## 2017-12-16 LAB — MRSA PCR SCREENING: MRSA BY PCR: NEGATIVE

## 2017-12-16 LAB — SODIUM, URINE, RANDOM: Sodium, Ur: 62 mmol/L

## 2017-12-16 MED ORDER — LIDOCAINE 5 % EX PTCH
1.0000 | MEDICATED_PATCH | CUTANEOUS | Status: DC
  Administered 2017-12-16 – 2017-12-18 (×3): 1 via TRANSDERMAL
  Filled 2017-12-16 (×3): qty 1

## 2017-12-16 MED ORDER — TRAZODONE HCL 50 MG PO TABS
50.0000 mg | ORAL_TABLET | Freq: Every evening | ORAL | Status: DC | PRN
  Administered 2017-12-16 – 2017-12-17 (×2): 50 mg via ORAL
  Filled 2017-12-16 (×2): qty 1

## 2017-12-16 MED ORDER — PREDNISONE 5 MG PO TABS
10.0000 mg | ORAL_TABLET | Freq: Every day | ORAL | Status: AC
  Administered 2017-12-17 – 2017-12-18 (×2): 10 mg via ORAL
  Filled 2017-12-16 (×2): qty 2

## 2017-12-16 MED ORDER — PREGABALIN 75 MG PO CAPS
150.0000 mg | ORAL_CAPSULE | Freq: Every day | ORAL | Status: DC
  Administered 2017-12-17 – 2017-12-18 (×2): 150 mg via ORAL
  Filled 2017-12-16 (×2): qty 2

## 2017-12-16 NOTE — Progress Notes (Addendum)
Initial Nutrition Assessment  DOCUMENTATION CODES:   Obesity unspecified  INTERVENTION:    Magic cup TID with meals, each supplement provides 290 kcal and 9 grams of protein  Liberalize diet   NUTRITION DIAGNOSIS:   Inadequate oral intake related to poor appetite as evidenced by per patient/family report.  GOAL:   Patient will meet greater than or equal to 90% of their needs  MONITOR:   PO intake, Supplement acceptance, Labs, Weight trends  REASON FOR ASSESSMENT:   Malnutrition Screening Tool    ASSESSMENT:   Patient with PMH significant for CKD stage III, HTN, renal cell carcinoma s/p left nephrectomy, s/p splenectomy, lupus, and chronic prednisone. Presents this admission with acute renal failure on CKD stage III and dehydration.    Pt endorses a loss of appetite one week PTA. Prior to this time period pt would consume two meals with snacks daily that consisted of meatballs with rice, eggs with bacon, and chicken with vegetables. With loss in appetite portions decreased to ~50% completion of each meal. Pt uses dentures at home and tries to stick to softer foods. Denies any issues with swallowing. Pt currently on renal diet and attempted to eat eggs with bacon this morning. She felt almost immediate abdominal pain.   Labs look to be trending down and electrolytes are WDL. Recommend liberalizing  diet to maximize calories and protein. RD to send supplements (pt does not like Ensure or Nepro).   Pt endorses a UBW of  269 lb after being prescribed prednisone and reports a 100 lb wt loss over the last year. Records do not reflect this report. Pt shows to have weighed 173 lb at [facility] on 09/10/17, 179 lb at [facility] on 09/19/17, and 181 lb this admission. Nutrition-Focused physical exam completed.   Medications reviewed and include: NS @ 75 ml/hr Labs reviewed.   NUTRITION - FOCUSED PHYSICAL EXAM:    Most Recent Value  Orbital Region  No depletion  Upper Arm Region  No depletion   Thoracic and Lumbar Region  Unable to assess  Buccal Region  No depletion  Temple Region  No depletion  Clavicle Bone Region  No depletion  Clavicle and Acromion Bone Region  No depletion  Scapular Bone Region  Unable to assess  Dorsal Hand  No depletion  Patellar Region  No depletion  Anterior Thigh Region  No depletion  Posterior Calf Region  No depletion  Edema (RD Assessment)  None     Diet Order:   Diet Order            Diet renal with fluid restriction Fluid restriction: 1200 mL Fluid; Room service appropriate? Yes; Fluid consistency: Thin  Diet effective now              EDUCATION NEEDS:   Education needs have been addressed  Skin:  Skin Assessment: Reviewed RN Assessment  Last BM:  12/15/17  Height:   Ht Readings from Last 1 Encounters:  12/15/17 5\' 2"  (1.575 m)    Weight:   Wt Readings from Last 1 Encounters:  12/16/17 82.3 kg    Ideal Body Weight:  50 kg  BMI:  Body mass index is 33.18 kg/m.  Estimated Nutritional Needs:   Kcal:  1550-1750 kcal  Protein:  80-95 grams  Fluid:  >/= 1.5 L/day  Mariana Single RD, LDN Clinical Nutrition Pager # - [phone]

## 2017-12-16 NOTE — Progress Notes (Signed)
PROGRESS NOTE    Carolyn Zamora  ONG:[id number] DOB: [birth year] DOA: 12/15/2017 PCP: Beckie Salts, MD    Brief Narrative: 62 y.o. female with medical history significant of chronic kidney disease stage III, history of renal cell carcinoma status post left nephrectomy, history of vitamin B12 deficiency, history of splenectomy, chronic pain, morbid obesity, thrombocytopenia, lupus, chronic immunosuppressive medications of CellCept, hydroxychloroquine, chronic prednisone presenting to [facility] from PCPs office with a one-week history of worsening black loose stools, generalized weakness, chronic left-sided abdominal pain which has worsened.  Patient denies any fevers, no chills, no chest pain, no constipation, no nausea, no vomiting, no hematemesis, no hematochezia.  Patient does endorse some shortness of breath.  Patient denies any recent use of antibiotics.  No recent change in her diet.  No dysuria.  No change in chronic blurry vision.  Patient does endorse ongoing diarrhea for 2 months that has since gotten worse over the past week.  Patient does endorse compliance with all her medications.  Patient was seen in the ED at the [facility].  ED Course: comprehensive metabolic profile obtained had a creatinine of 2.35, potassium of 3.5, bicarb of 19 otherwise was within normal limits.  CBC done had a hemoglobin of 11.1 otherwise was within normal limits.  Urinalysis done was moderate leukocytes, nitrite negative, many bacteria, present WBC clumps, 21-50 WBCs.  Urine cultures pending.  Patient given a dose of IV Rocephin.  Hospitalist were called to admit the patient for further evaluation and management. Assessment & Plan:   Principal Problem:   Acute renal failure superimposed on stage 3 chronic kidney disease (HCC) Active Problems:   UTI (urinary tract infection)   Hypertension   Systemic lupus erythematosus (HCC)   Abdominal wall seroma - chronic from prior [facility] repair  2017   AVN (avascular necrosis of bone) (HCC)   Chronic pain associated with significant psychosocial dysfunction   Immunocompromised state ([place])   Chronic pain syndrome   Renal cell carcinoma of left kidney s/p nephrectomy   AKI (acute kidney injury) ([place])   Diarrhea   Dehydration   Anemia   Renal failure (ARF), acute on chronic (HCC)   1 AKI on chronic kidney disease stage III Baseline creatinine approximately 1.5.  Patient presented with acute renal failure on chronic kidney disease stage III with a creatinine of 2.35 on presentation.  Likely secondary to a prerenal azotemia secondary to GI losses from diarrhea and on Lasix.  Patient status post left nephrectomy secondary to renal cell carcinoma.  Renal functions improving with slow IV hydration.  Today her creatinine is  2.13 down from 2.35 yesterday.  Continue slow IV hydration.  Continue to hold Lasix.  Patient has a history of renal cell carcinoma and status post left nephrectomy and has a history of splenectomy.  She needs to be monitored in the hospital on IV fluids and her renal functions returns back to baseline.  And nephrology consult has been placed with the admitting physician and is pending at this time.  2] diarrhea C. difficile PCR pending reportedly she had black stools upon presentation.  Will check FOBT.  3] anemia of chronic disease check FOBT follow H&H no overt signs of bleeding.  Drop in hemoglobin from 11-9 noted probably secondary to hemodilution.  4] chronic pain syndrome patient requesting morphine and complaining of excruciating pain Of the left posterior back.  She appeared comfortable while I walked in the room.  Is a started touching her and  examining her without even palpating deeply she started screaming of pain.  No obvious rash noted.  The patient reports she was started on Lyrica which somewhat helps.  Reviewed the CT scan done on admission.  It shows reduced the size of the left anterior abdominal wall fluid  collection, resolved left pleural effusion.  Changes consistent with left nephrectomy with oval-shaped density along the nephrectomy bed prior splenectomy with regenerative splenic tissues bilateral femoral head avascular necrosis, stable appearance of subluxation and endplate collapse at R4-Y7 with impingement at L4-L5.  5] questionable UTI patient has diffuse pain involving the left side of the abdominal wall.  No matter where I touch she is screaming of pain.  I do specifically feel she has suprapubic abdominal tenderness.  Follow-up final urine culture.  6] history of lupus continue CellCept hydroxychloroquine and prednisone.  7] hypertension patient does have a history of high blood pressure but not on any medications at this time blood pressure has been stable Lasix is on hold due to diarrhea decreased p.o. intake and dehydration and AKI.    Nutrition Problem: Inadequate oral intake Etiology: poor appetite  Nutrition Interventions:  Interventions: Magic cup, Liberalize Diet  Estimated body mass index is 33.18 kg/m as calculated from the following:   Height as of this encounter: 5\' 2"  (1.575 m).   Weight as of this encounter: 82.3 kg.  DVT prophylaxis: Lovenox code Status full code Family Communication: No family available Disposition Plan once renal functions returns back to normal Consultants: None nephrology consult pending  Procedures: None Antimicrobials none  Subjective: Complains of pain in the left posterior rib area tender to touch even with slight touch   Objective: Vitals:   12/15/17 1906 12/15/17 2205 12/16/17 0500 12/16/17 0543  BP: (!) 147/69 135/61  121/64  Pulse: (!) 102 98  94  Resp: 18 16  16   Temp: 98.4 F (36.9 C) 98.1 F (36.7 C)  98 F (36.7 C)  TempSrc: Oral Oral  Oral  SpO2: 100% 99%  99%  Weight:   82.3 kg   Height:        Intake/Output Summary (Last 24 hours) at 12/16/2017 1053 Last data filed at 12/16/2017 1002 Gross per 24 hour  Intake  1831.11 ml  Output 500 ml  Net 1331.11 ml   Filed Weights   12/15/17 1110 12/16/17 0500  Weight: 77.1 kg 82.3 kg    Examination:  General exam: Appears calm and comfortable  Respiratory system: Clear to auscultation. Respiratory effort normal. Cardiovascular system: S1 & S2 heard, RRR. No JVD, murmurs, rubs, gallops or clicks. No pedal edema. Gastrointestinal system: Abdomen is nondistended, soft and nontender. No organomegaly or masses felt. Normal bowel sounds heard. Central nervous system: Alert and oriented. No focal neurological deficits. Extremities: Symmetric 5 x 5 power. Skin: No erythema edema noted Psychiatry: Judgement and insight appear normal. Mood & affect appropriate.     Data Reviewed: I have personally reviewed following labs and imaging studies  CBC: Recent Labs  Lab 12/15/17 1134 12/16/17 0406  WBC 7.9 4.8  NEUTROABS  --  2.1  HGB 11.1* 9.2*  HCT 37.2 31.6*  MCV 75.6* 76.3*  PLT 205 062   Basic Metabolic Panel: Recent Labs  Lab 12/15/17 1134 12/16/17 0944  NA 137 141  K 3.5 3.9  CL 107 114*  CO2 19* 19*  GLUCOSE 76 131*  BUN 32* 25*  CREATININE 2.35* 2.13*  CALCIUM 8.5* 8.0*   GFR: Estimated Creatinine Clearance: 27.9 mL/min (A) (  by C-G formula based on SCr of 2.13 mg/dL (H)). Liver Function Tests: Recent Labs  Lab 12/15/17 1134  AST 40  ALT 32  ALKPHOS 70  BILITOT 0.6  PROT 7.0  ALBUMIN 4.0   Recent Labs  Lab 12/15/17 1134  LIPASE 28   No results for input(s): AMMONIA in the last 168 hours. Coagulation Profile: No results for input(s): INR, PROTIME in the last 168 hours. Cardiac Enzymes: No results for input(s): CKTOTAL, CKMB, CKMBINDEX, TROPONINI in the last 168 hours. BNP (last 3 results) No results for input(s): PROBNP in the last 8760 hours. HbA1C: No results for input(s): HGBA1C in the last 72 hours. CBG: No results for input(s): GLUCAP in the last 168 hours. Lipid Profile: No results for input(s): CHOL, HDL,  LDLCALC, TRIG, CHOLHDL, LDLDIRECT in the last 72 hours. Thyroid Function Tests: Recent Labs    12/15/17 1917  TSH 5.899*   Anemia Panel: No results for input(s): VITAMINB12, FOLATE, FERRITIN, TIBC, IRON, RETICCTPCT in the last 72 hours. Sepsis Labs: No results for input(s): PROCALCITON, LATICACIDVEN in the last 168 hours.  Recent Results (from the past 240 hour(s))  Culture, blood (Routine X 2) w Reflex to ID Panel     Status: None (Preliminary result)   Collection Time: 12/15/17  7:17 PM  Result Value Ref Range Status   Specimen Description   Final    BLOOD LEFT ARM Performed at [facility], [address] [place], [place] [address]    Special Requests   Final    BOTTLES DRAWN AEROBIC AND ANAEROBIC Blood Culture adequate volume Performed at [facility] [address] [place], [place] [address]    Culture   Final    NO GROWTH < 12 HOURS Performed at [facility] [address] [place], [place] [address]    Report Status PENDING  Incomplete  Culture, blood (Routine X 2) w Reflex to ID Panel     Status: None (Preliminary result)   Collection Time: 12/15/17  7:18 PM  Result Value Ref Range Status   Specimen Description   Final    BLOOD LEFT ARM Performed at [facility], [place] [address], [place], [place] [address]    Special Requests   Final    BOTTLES DRAWN AEROBIC ONLY Blood Culture adequate volume Performed at [facility] [address] [place], [place] [address]    Culture   Final    NO GROWTH < 12 HOURS Performed at [facility] [address], [place], [place] [address]    Report Status PENDING  Incomplete  MRSA PCR Screening     Status: None   Collection Time: 12/16/17  1:09 AM  Result Value Ref Range Status   MRSA by PCR NEGATIVE NEGATIVE Final    Comment:        The GeneXpert MRSA Assay (FDA approved for NASAL specimens only), is one component of a comprehensive MRSA  colonization surveillance program. It is not intended to diagnose MRSA infection nor to guide or monitor treatment for MRSA infections. Performed at [facility], [place] [address] [place], [place] [address]          Radiology Studies: Ct Abdomen Pelvis Wo Contrast  Result Date: 12/15/2017 CLINICAL DATA:  Abdominal pain and diarrhea for a week. Black stool. Splenectomy, renal cell carcinoma s/p left nephrectomy, Lupus, HTN, CKD, AVN, ventral hernia. EXAM: CT ABDOMEN AND PELVIS WITHOUT CONTRAST TECHNIQUE: Multidetector CT imaging  of the abdomen and pelvis was performed following the standard protocol without IV contrast. COMPARISON:  07/31/2017 FINDINGS: Lower chest: Descending thoracic aortic atherosclerotic calcification. The left pleural effusion has resolved but there is scarring and some volume loss at the left lung base with associated mild nodularity along the scarring. Mild pleural thickening or nodularity inferiorly along the right lower lobe side of the right major fissure, approximately 0.8 cm in thickness, stable from 07/31/2017. Hepatobiliary: Streak artifact from the patient's right arm positioning partially obscures the liver. Borderline distended gallbladder. Pancreas: Unremarkable Spleen: Regenerative splenic tissue noted similar to prior. Adrenals/Urinary Tract: Stable nodularity along the nephrectomy site measuring 2.3 by 0.9 cm. Adrenal glands unremarkable. No right-sided urinary tract calculi. No hydronephrosis. Stomach/Bowel: No findings of diverticulosis or appreciable diverticulitis. The cecum is mobile and in the left abdomen Vascular/Lymphatic: Aortoiliac atherosclerotic vascular disease. No appreciable adenopathy. Reproductive: Convex lower uterine contour possibly from fibroid but technically nonspecific. Calcifications along the right adnexa possibly from a subserosal fibroid or in the ovary. These are unchanged. Other: Superficial anterior abdominal wall  fluid collection measures 8.5 by 1.7 cm on image 32/2, formerly 9.8 by 2.3 cm. Musculoskeletal: Bilateral femoral head avascular necrosis without appreciable collapse. Degenerative loss of articular space in both hips. 13 mm of degenerative anterolisthesis at L4-5 with partial collapse of the L4 and L5 endplates leading to mild vertebral interdigitation on image 57/3. 5 mm degenerative retrolisthesis at L5-S1. There is resulting foraminal impingement at the L4-5 level, left greater than right. IMPRESSION: 1. A cause for blood in the stool and diarrhea is not identified on today's noncontrast CT examination. 2. Mobile cecum in the left upper quadrant. 3. Reduced size of the left anterior abdominal wall fluid collection. 4. Resolved left pleural effusion although there is continued scarring in the left lower lobe. Minimal nodularity along this scarring and along the right lower lobe hemidiaphragmatic margin with the major fissure, nonspecific. 5. Left nephrectomy with chronic oval-shaped density along the nephrectomy bed which may simply be postoperative findings. 6. Prior splenectomy with regenerative splenic tissues. 7.  Aortic Atherosclerosis (ICD10-I70.0). 8. Suspected uterine fibroids. 9. Bilateral femoral head AVN. 10. Stable appearance of subluxations and endplate collapse at L4 and L5 with resulting considerable impingement at L4-5. Electronically Signed   By: Van Clines M.D.   On: 12/15/2017 13:09        Scheduled Meds: . DULoxetine  60 mg Oral Daily  . heparin  5,000 Units Subcutaneous Q8H  . hydroxychloroquine  200 mg Oral Daily  . mycophenolate  500 mg Oral Daily  . predniSONE  40 mg Oral QAC breakfast  . [START ON 12/17/2017] pregabalin  150 mg Oral Daily  . sodium chloride flush  3 mL Intravenous Q12H   Continuous Infusions: . sodium chloride 75 mL/hr at 12/16/17 0953  . cefTRIAXone (ROCEPHIN)  IV       LOS: 1 day     Georgette Shell, MD [facility]  If  7PM-7AM, please contact night-coverage [email or web address] Password [email or web address] 12/16/2017, 10:53 AM

## 2017-12-16 NOTE — Progress Notes (Signed)
OT Cancellation Note  Patient Details Name: Carolyn Zamora MRN: [id number] DOB: [birth year]   Cancelled Treatment:    Reason Eval/Treat Not Completed: Patient declined, no reason specified Met with pt in room with RN, deferring OT treatment this date due to pain and "having a bad day" despite consistent encouragement to get OOB. RN staff reporting pt getting to [facility] with minimal difficulty. Will continue to follow pt and initiate OT POC as pt available and appropriate.    Zenovia Jarred, MSOT, OTR/L Behavioral Health OT/ Acute Relief OT [facility] Office: [phone]  Zenovia Jarred 12/16/2017, 4:15 PM

## 2017-12-16 NOTE — Progress Notes (Signed)
PT Cancellation Note  Patient Details Name: Carolyn Zamora MRN: [id number] DOB: 1955-05-09   Cancelled Treatment:    Reason Eval/Treat Not Completed: Pain limiting ability to participate at 1200 patient reports pain is too much at the time to mobililize. Check back another. [place] Pager [phone] Office [phone]    Claretha Cooper 12/16/2017, 4:15 PM

## 2017-12-16 NOTE — Consult Note (Signed)
[facility] Renal Consultation Note  Requesting MD: Dr. Grandville Silos Indication for Consultation:  AKI  HPI: Carolyn Zamora is a 62 y.o. female with h/o CKD 3 on h/o L nephrectomy for RCCa, SLE, h/o AIHA s/p splenectomy, h/o thrombocytopenia who is admitted for abd pain, weakness, loose stools.  Nephrology is requested to consutl regarding AKI on known CKD.    She was admitted with the following complaints yesterday.  UA concerning for UTI so she has been treated with rocephin.  Her diuretic was held and she was hydrated with NS 551m bolus +  75/hr overnight.  Cr improved from 2.35 to 2.13.  UOP 4034msince 7am today.   She tells me she's had a 2 week history of progressive pain in the left flank radiating around under her breast.  Comes on with light touch or spontaneously.  Has h/o remote shingles and post herpetic neuralgia in this location but acutely worse.  She was also having loose stools and thinks she may have been a bit dehydrated.  She continued taking her lasix in the face of diarrhea.  She denies dysuria, sensation of incomplete void, malodorous urine but says she didn't really pay attention. She denies NSAID use.    On review of labs it appears her creatinine generally runs in the 1.5 - 1.8 range recently (07/2017 labs).  [facility]  [facility] this [facility].   She has required dialysis for what sounds like a more protracted course around her nephrectomy but was able to discontinue eventually.  In her chart I see AVF creation but she does not have a functional AV access at this time.   [facility]  Labs showed slightly low C4, normal C3, cr 2.57, normal lytes, normal dsDNA, Hb 10.1, Plt 188.  No changes were made - cont on plaquenil 200, cellcept 500 daily, pred 5.   She denies oral ulcers, rashes, + diffuse joint  pains of various nature.       Creatinine, Ser  Date/Time Value Ref Range Status  12/16/2017 09:44 AM 2.13 (H) 0.44 - 1.00 mg/dL Final  12/15/2017 11:34 AM 2.35 (H) 0.44 - 1.00 mg/dL Final  08/03/2017 05:25 AM 1.54 (H) 0.44 - 1.00 mg/dL Final  08/02/2017 04:35 AM 1.49 (H) 0.44 - 1.00 mg/dL Final  08/01/2017 07:13 AM 1.50 (H) 0.44 - 1.00 mg/dL Final  07/31/2017 02:05 PM 1.59 (H) 0.44 - 1.00 mg/dL Final  07/29/2017 04:42 AM 1.74 (H) 0.44 - 1.00 mg/dL Final  07/28/2017 04:54 AM 1.76 (H) 0.44 - 1.00 mg/dL Final  07/27/2017 03:59 AM 1.87 (H) 0.44 - 1.00 mg/dL Final  07/26/2017 04:30 AM 2.24 (H) 0.44 - 1.00 mg/dL Final  07/25/2017 04:50 AM 2.62 (H) 0.44 - 1.00 mg/dL Final  07/24/2017 04:50 AM 3.27 (H) 0.44 - 1.00 mg/dL Final  07/23/2017 01:01 PM 3.92 (H) 0.44 - 1.00 mg/dL Final  12/28/2015 04:57 AM 3.69 (H) 0.44 - 1.00 mg/dL Final  12/27/2015 06:08 PM 3.87 (H) 0.44 - 1.00 mg/dL Final     PMHx:   Past Medical History:  Diagnosis Date  . Abdominal wall hematoma 08/01/2017  . Anemia   . AVN (avascular necrosis of bone) [place]  . CKD (chronic kidney disease), stage IV ([facility]  . History of dental abscess with sepsis 2017 08/02/2017  . History of kidney cancer   .  Hypertension   . Lupus ([place])   . 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 Ventral hernia without obstruction or gangrene 10/24/2015    Past Surgical History:  Procedure Laterality Date  . INCISIONAL HERNIA REPAIR  11/19/2015   [facility]  Dr Phineas Douglas.  left VWH repair w mesh  . MULTIPLE TOOTH EXTRACTIONS    . NEPHRECTOMY    . ORIF ANKLE FRACTURE  11/16/2015   Max Cohen, 2017  . SPLENECTOMY      Family Hx:  Family History  Problem Relation Age of Onset  . Hypertension Mother   . Diabetes Mellitus I Mother   . Lung cancer Father   . Hypertension Sister   . Colon cancer Brother     Social History:  reports that she has never smoked. She has never used smokeless tobacco. She reports that  she does not drink alcohol or use drugs.  Allergies: No Known Allergies  Medications: Prior to Admission medications   Medication Sig Start Date End Date Taking? Authorizing Provider  amLODipine (NORVASC) 10 MG tablet Take 10 mg by mouth daily. 07/23/17  Yes [provider]  DULoxetine (CYMBALTA) 30 MG capsule Take 60 mg by mouth daily.  10/28/17  Yes [provider]  furosemide (LASIX) 40 MG tablet Take 40 mg by mouth daily. 07/02/17  Yes [provider]  HYDROcodone-acetaminophen (NORCO/VICODIN) 5-325 MG tablet Take 1 tablet by mouth every 8 (eight) hours as needed for pain. 12/15/15  Yes [provider]  hydroxychloroquine (PLAQUENIL) 200 MG tablet Take 200 mg by mouth daily. 12/15/15  Yes [provider]  hydrOXYzine (ATARAX/VISTARIL) 50 MG tablet Take 50 mg by mouth 3 (three) times daily as needed for nausea.  07/06/17  Yes [provider]  mycophenolate (CELLCEPT) 500 MG tablet Take 500 mg by mouth daily. 03/18/17  Yes [provider]  predniSONE (DELTASONE) 10 MG tablet Take 10 mg by mouth daily with breakfast.   Yes [provider]  pregabalin (LYRICA) 150 MG capsule Take 150 mg by mouth 2 (two) times daily.   Yes [provider]  traZODone (DESYREL) 50 MG tablet Take 50 mg by mouth daily. 06/29/17  Yes [provider]  Vitamin D, Ergocalciferol, (DRISDOL) 50000 units CAPS capsule Take 1 capsule by mouth once a week. 07/01/17  Yes [provider]    I have reviewed the patient's current medications.  Labs:  Results for orders placed or performed during the hospital encounter of 12/15/17 (from the past 48 hour(s))  Comprehensive metabolic panel     Status: Abnormal   Collection Time: 12/15/17 11:34 AM  Result Value Ref Range   Sodium 137 135 - 145 mmol/L   Potassium 3.5 3.5 - 5.1 mmol/L   Chloride 107 98 - 111 mmol/L   CO2 19 (L) 22 - 32 mmol/L   Glucose, Bld 76 70 - 99 mg/dL   BUN 32 (H) 6  - 20 mg/dL   Creatinine, Ser 2.35 (H) 0.44 - 1.00 mg/dL   Calcium 8.5 (L) 8.9 - 10.3 mg/dL   Total Protein 7.0 6.5 - 8.1 g/dL   Albumin 4.0 3.5 - 5.0 g/dL   AST 40 15 - 41 U/L   ALT 32 0 - 44 U/L   Alkaline Phosphatase 70 38 - 126 U/L   Total Bilirubin 0.6 0.3 - 1.2 mg/dL   GFR calc non Af Amer 21 (L) >60 mL/min   GFR calc Af Amer 25 (L) >60 mL/min  Comment: (NOTE) The eGFR has been calculated using the CKD EPI equation. This calculation has not been validated in all clinical situations. eGFR's persistently <60 mL/min signify possible Chronic Kidney Disease.    Anion gap 11 5 - 15    Comment: Performed at [facility], [address] [place], [place] [address]  CBC     Status: Abnormal   Collection Time: 12/15/17 11:34 AM  Result Value Ref Range   WBC 7.9 4.0 - 10.5 K/uL   RBC 4.92 3.87 - 5.11 MIL/uL   Hemoglobin 11.1 (L) 12.0 - 15.0 g/dL   HCT 37.2 36.0 - 46.0 %   MCV 75.6 (L) 80.0 - 100.0 fL   MCH 22.6 (L) 26.0 - 34.0 pg   MCHC 29.8 (L) 30.0 - 36.0 g/dL   RDW 16.3 (H) 11.5 - 15.5 %   Platelets 205 150 - 400 K/uL   nRBC 0.0 0.0 - 0.2 %    Comment: Performed at [facility], [address] [place], [place] [address]  Lipase, blood     Status: None   Collection Time: 12/15/17 11:34 AM  Result Value Ref Range   Lipase 28 11 - 51 U/L    Comment: Performed at [facility], [address] [place], [place] [address]  Occult blood card to lab, stool Provider will collect     Status: None   Collection Time: 12/15/17  1:45 PM  Result Value Ref Range   Fecal Occult Bld NEGATIVE NEGATIVE    Comment: Performed at [facility], [address] [place], [place] [address]  Urinalysis, Routine w reflex microscopic     Status: Abnormal   Collection Time: 12/15/17  2:24 PM  Result Value Ref Range   Color, Urine YELLOW YELLOW   APPearance CLEAR CLEAR   Specific Gravity, Urine 1.025 1.005 - 1.030   pH 6.0 5.0 - 8.0   Glucose, UA NEGATIVE  NEGATIVE mg/dL   Hgb urine dipstick NEGATIVE NEGATIVE   Bilirubin Urine NEGATIVE NEGATIVE   Ketones, ur NEGATIVE NEGATIVE mg/dL   Protein, ur NEGATIVE NEGATIVE mg/dL   Nitrite NEGATIVE NEGATIVE   Leukocytes, UA MODERATE (A) NEGATIVE    Comment: Performed at [facility], [address] [place], [place] [address]  Urinalysis, Microscopic (reflex)     Status: Abnormal   Collection Time: 12/15/17  2:24 PM  Result Value Ref Range   RBC / HPF 0-5 0 - 5 RBC/hpf   WBC, UA 21-50 0 - 5 WBC/hpf   Bacteria, UA MANY (A) NONE SEEN   Squamous Epithelial / LPF 11-20 0 - 5   WBC Clumps PRESENT     Comment: Performed at [facility], [address] [place], [place] [address]  TSH     Status: Abnormal   Collection Time: 12/15/17  7:17 PM  Result Value Ref Range   TSH 5.899 (H) 0.350 - 4.500 uIU/mL    Comment: Performed by a 3rd Generation assay with a functional sensitivity of <=0.01 uIU/mL. Performed at [facility], [place] [address], [place], [place] [address]   Culture, blood (Routine X 2) w Reflex to ID Panel     Status: None (Preliminary result)   Collection Time: 12/15/17  7:17 PM  Result Value Ref Range   Specimen Description      BLOOD LEFT ARM Performed at [facility], [place] [address] [place], [place]  [address]    Special Requests      BOTTLES DRAWN AEROBIC AND ANAEROBIC Blood Culture adequate volume Performed at [place] [address] [place], [place] [address]    Culture      NO GROWTH < 12 HOURS Performed at [place] [address] [place], [place] [address]    Report Status PENDING   Culture, blood (Routine X 2) w Reflex to ID Panel     Status: None (Preliminary result)   Collection Time: 12/15/17  7:18 PM  Result Value Ref Range   Specimen Description      BLOOD LEFT ARM Performed at [place] [address], [place], [place] [address]    Special Requests      BOTTLES DRAWN  AEROBIC ONLY Blood Culture adequate volume Performed at [place] [address] [place], [place] [address]    Culture      NO GROWTH < 12 HOURS Performed at [place] [address] [place], [place] [address]    Report Status PENDING   Sodium, urine, random     Status: None   Collection Time: 12/16/17  1:09 AM  Result Value Ref Range   Sodium, Ur 62 mmol/L    Comment: Performed at [facility], [place] [address] [place], [place] [address]  Creatinine, urine, random     Status: None   Collection Time: 12/16/17  1:09 AM  Result Value Ref Range   Creatinine, Urine 168.64 mg/dL    Comment: Performed at [facility], [place] [address] [place], [place] [address]  MRSA PCR Screening     Status: None   Collection Time: 12/16/17  1:09 AM  Result Value Ref Range   MRSA by PCR NEGATIVE NEGATIVE    Comment:        The GeneXpert MRSA Assay (FDA approved for NASAL specimens only), is one component of a comprehensive MRSA colonization surveillance program. It is not intended to diagnose MRSA infection nor to guide or monitor treatment for MRSA infections. Performed at [facility], [place] [address], [place], [place] [address]   CBC WITH DIFFERENTIAL     Status: Abnormal   Collection Time: 12/16/17  4:06 AM  Result Value Ref Range   WBC 4.8 4.0 - 10.5 K/uL   RBC 4.14 3.87 - 5.11 MIL/uL   Hemoglobin 9.2 (L) 12.0 - 15.0 g/dL   HCT 31.6 (L) 36.0 - 46.0 %   MCV 76.3 (L) 80.0 - 100.0 fL   MCH 22.2 (L) 26.0 - 34.0 pg   MCHC 29.1 (L) 30.0 - 36.0 g/dL   RDW 16.7 (H) 11.5 - 15.5 %   Platelets 175 150 - 400 K/uL   nRBC 0.4 (H) 0.0 - 0.2 %   Neutrophils Relative % 43 %   Neutro Abs 2.1 1.7 - 7.7 K/uL   Lymphocytes Relative 40 %   Lymphs Abs 1.9 0.7 - 4.0 K/uL   Monocytes Relative 14 %   Monocytes Absolute 0.7 0.1 - 1.0 K/uL   Eosinophils Relative 2 %   Eosinophils Absolute 0.1 0.0 - 0.5 K/uL   Basophils  Relative 1 %   Basophils Absolute 0.0 0.0 - 0.1 K/uL   Immature Granulocytes 0 %   Abs Immature Granulocytes 0.02 0.00 - 0.07 K/uL    Comment: Performed at [facility], [place] [address], [place], [place] [address]  Basic metabolic panel  Status: Abnormal   Collection Time: 12/16/17  9:44 AM  Result Value Ref Range   Sodium 141 135 - 145 mmol/L   Potassium 3.9 3.5 - 5.1 mmol/L   Chloride 114 (H) 98 - 111 mmol/L   CO2 19 (L) 22 - 32 mmol/L   Glucose, Bld 131 (H) 70 - 99 mg/dL   BUN 25 (H) 6 - 20 mg/dL   Creatinine, Ser 2.13 (H) 0.44 - 1.00 mg/dL   Calcium 8.0 (L) 8.9 - 10.3 mg/dL   GFR calc non Af Amer 24 (L) >60 mL/min   GFR calc Af Amer 28 (L) >60 mL/min    Comment: (NOTE) The eGFR has been calculated using the CKD EPI equation. This calculation has not been validated in all clinical situations. eGFR's persistently <60 mL/min signify possible Chronic Kidney Disease.    Anion gap 8 5 - 15    Comment: Performed at [facility], [place] [address] [place], [place] [address]     ROS:  Pertinent items are noted in HPI.  Physical Exam: Vitals:   12/15/17 2205 12/16/17 0543  BP: 135/61 121/64  Pulse: 98 94  Resp: 16 16  Temp: 98.1 F (36.7 C) 98 F (36.7 C)  SpO2: 99% 99%     General: chronically ill appearing, nontoxic HEENT: MM tacky, no oral ulcers Eyes: anicteric Neck: supple, flat JVD Heart: RRR, no rub Lungs: clear Abdomen: soft, exquisitely TTP with light tough in the LUQ, lots of prior scars but no active lesions   Extremities: no edema Skin: ecchymoses noted on the LE, she states from lupus not trauma Neuro: grossly nonfocal, conversant  Assessment/Plan: 1.  AKI on CKD:  Her baseline renal function appears to be in the 1.5 - 1.8 range though just 1 month ago she did have a creatinine at an outpatient visit of 2.5 on a history of solitary kidney and h/o severe AKIs.  She has fairly advanced CKD and in setting of modest  volume depletion on presentation it's not surprising her creatinine is up a bit.  Supported by FeNa 0.6% on presentation as well as improvement with IVF in past 12 hrs.  Continue NS 75/hr for now.  I will continue to follow but from a nephrology perspective I am ok with discharge once she is maintaining oral intake and acute issues are resolved, even if creatinine remains at this level.   She can f/u with her outpatient nephrologist later this month per already scheduled visit.   2.  Asymptomatic bacteruria:  Hospitalist has discontinued antibiotics.  Continue to monitor for signs, symptoms of UTI.   3.  SLE:  On plaquenil 200, cellcept 500 and pred 5 daily.  On prednisone 60 daily in face of acute presentation - would decrease back to home dose prednisone unless hypotensive.   4.  Abdominal pain:  Symptoms seem similar to shingles pain but have been going on for 2 weeks without any skin lesions.  Has h/o PNH and takes meds for this.  Unclear why this is worse recently.   5.  Diarrhea: on cellcept at low dose and not recent changed - doubt related to medication.  Infectious w/u underway per primary.  6.  Anemia of chronic disease: stable c/w priors.  No indications for ESA.   Please don't hesitate to contact me with questions or concerns.   Jannifer Hick MD [facility] Pager [phone] 12/16/2017, 11:35 AM

## 2017-12-17 LAB — CBC
HCT: 32.3 % — ABNORMAL LOW (ref 36.0–46.0)
Hemoglobin: 9.6 g/dL — ABNORMAL LOW (ref 12.0–15.0)
MCH: 22.3 pg — ABNORMAL LOW (ref 26.0–34.0)
MCHC: 29.7 g/dL — ABNORMAL LOW (ref 30.0–36.0)
MCV: 74.9 fL — ABNORMAL LOW (ref 80.0–100.0)
PLATELETS: 150 10*3/uL (ref 150–400)
RBC: 4.31 MIL/uL (ref 3.87–5.11)
RDW: 16.9 % — ABNORMAL HIGH (ref 11.5–15.5)
WBC: 8.2 10*3/uL (ref 4.0–10.5)
nRBC: 0.4 % — ABNORMAL HIGH (ref 0.0–0.2)

## 2017-12-17 LAB — BASIC METABOLIC PANEL
ANION GAP: 7 (ref 5–15)
BUN: 20 mg/dL (ref 6–20)
CO2: 16 mmol/L — AB (ref 22–32)
Calcium: 8.1 mg/dL — ABNORMAL LOW (ref 8.9–10.3)
Chloride: 117 mmol/L — ABNORMAL HIGH (ref 98–111)
Creatinine, Ser: 1.96 mg/dL — ABNORMAL HIGH (ref 0.44–1.00)
GFR calc Af Amer: 31 mL/min — ABNORMAL LOW (ref >60)
GFR, EST NON AFRICAN AMERICAN: 27 mL/min — AB (ref >60)
Glucose, Bld: 96 mg/dL (ref 70–99)
Potassium: 4.6 mmol/L (ref 3.5–5.1)
Sodium: 140 mmol/L (ref 135–145)

## 2017-12-17 MED ORDER — HYDROCODONE-ACETAMINOPHEN 5-325 MG PO TABS
1.0000 | ORAL_TABLET | Freq: Four times a day (QID) | ORAL | Status: DC | PRN
  Administered 2017-12-17 – 2017-12-18 (×3): 1 via ORAL
  Filled 2017-12-17 (×3): qty 1

## 2017-12-17 NOTE — Evaluation (Signed)
Physical Therapy Evaluation Patient Details Name: Carolyn Zamora MRN: [id number] DOB: 1955/12/26 Today's Date: 12/17/2017   History of Present Illness  Pt admitted with abdominal pain and diarrhea and dx with UTI and acute renal failure.  Pt with hx of Lupus, Renal cell CA s/p nephrectomy, AVN bilat hips, L4-5 impingement and ORIF L ankle (17)  Clinical Impression  Pt admitted as above and presenting with functional mobility limitations 2* generalized weakness, balance deficits and limited WB tolerance 2* foot/hip pain. This date, pt willing only to move bed to chair stating it was "too painful to walk without her shoes on" - pt states shoes not in hospital.  Pt plans to dc home with assist of family and could benefit from follow up HHPT but may not accept services stating "I've been doing this for thirteen years and will manage fine at home".    Follow Up Recommendations Home health PT    Equipment Recommendations  None recommended by PT    Recommendations for Other Services       Precautions / Restrictions Precautions Precautions: Fall Restrictions Weight Bearing Restrictions: No      Mobility  Bed Mobility Overal bed mobility: Modified Independent             General bed mobility comments: Pt to EOB unassisted  Transfers Overall transfer level: Needs assistance Equipment used: None Transfers: Sit to/from Stand Sit to Stand: Min assist Stand pivot transfers: Min assist;Mod assist       General transfer comment: steady assist only to stand but increased assist to balance and support on transfer bed to chair - pt states again needs shoes to walk  Ambulation/Gait Ambulation/Gait assistance: Min assist;Mod assist;+2 physical assistance Gait Distance (Feet): 2 Feet Assistive device: 1 person hand held assist Gait Pattern/deviations: Shuffle;Trunk flexed     General Gait Details: Pt moved bed to chair only.  Pt states unable to mobilize further without her  shoes 2* foot pain  Stairs            Wheelchair Mobility    Modified Rankin (Stroke Patients Only)       Balance Overall balance assessment: Needs assistance Sitting-balance support: No upper extremity supported;Feet supported Sitting balance-Leahy Scale: Good     Standing balance support: No upper extremity supported Standing balance-Leahy Scale: Fair                               Pertinent Vitals/Pain Pain Assessment: Faces Faces Pain Scale: Hurts even more Pain Location: abdomen; LEs/feet with weight bearing Pain Descriptors / Indicators: Aching;Sore Pain Intervention(s): Limited activity within patient's tolerance;Monitored during session;Premedicated before session    Home Living Family/patient expects to be discharged to:: Private residence Living Arrangements: Children Available Help at Discharge: Available PRN/intermittently;Available 24 hours/day Type of Home: Apartment Home Access: Level entry     Home Layout: One level Home Equipment: Walker - 4 wheels;Shower seat;Wheelchair - manual;Bedside commode Additional Comments: brother stops by daily, daughter works, family helps when she needs it     Prior Function Level of Independence: Independent with assistive device(s);Independent         Comments: reports had been driving and using RW or wc as needed to mobilize at home      Hand Dominance        Extremity/Trunk Assessment   Upper Extremity Assessment Upper Extremity Assessment: Defer to OT evaluation    Lower Extremity Assessment Lower Extremity Assessment: Generalized  weakness       Communication   Communication: No difficulties  Cognition Arousal/Alertness: Awake/alert Behavior During Therapy: WFL for tasks assessed/performed Overall Cognitive Status: Within Functional Limits for tasks assessed                                        General Comments      Exercises     Assessment/Plan    PT  Assessment Patient needs continued PT services  PT Problem List Decreased strength;Decreased activity tolerance;Decreased balance;Decreased mobility;Decreased knowledge of use of DME;Pain;Obesity       PT Treatment Interventions DME instruction;Gait training;Functional mobility training;Therapeutic activities;Therapeutic exercise;Balance training;Patient/family education    PT Goals (Current goals can be found in the Care Plan section)  Acute Rehab PT Goals Patient Stated Goal: HOME PT Goal Formulation: With patient Time For Goal Achievement: 12/31/17 Potential to Achieve Goals: Fair    Frequency Min 3X/week   Barriers to discharge        Co-evaluation PT/OT/SLP Co-Evaluation/Treatment: Yes Reason for Co-Treatment: For patient/therapist safety PT goals addressed during session: Mobility/safety with mobility OT goals addressed during session: ADL's and self-care       AM-PAC PT "6 Clicks" Daily Activity  Outcome Measure Difficulty turning over in bed (including adjusting bedclothes, sheets and blankets)?: A Little Difficulty moving from lying on back to sitting on the side of the bed? : A Little Difficulty sitting down on and standing up from a chair with arms (e.g., wheelchair, bedside commode, etc,.)?: A Lot Help needed moving to and from a bed to chair (including a wheelchair)?: A Lot Help needed walking in hospital room?: A Lot Help needed climbing 3-5 steps with a railing? : Total 6 Click Score: 13    End of Session   Activity Tolerance: Patient limited by pain Patient left: in chair;with call bell/phone within reach Nurse Communication: Mobility status PT Visit Diagnosis: Unsteadiness on feet (R26.81);Muscle weakness (generalized) (M62.81);Difficulty in walking, not elsewhere classified (R26.2)    Time: 1208-1222 PT Time Calculation (min) (ACUTE ONLY): 14 min   Charges:   PT Evaluation $PT Eval Low Complexity: 1 Low          [place] Pager [phone] Office [phone]     Gevorg Brum 12/17/2017, 2:18 PM

## 2017-12-17 NOTE — Progress Notes (Signed)
I reviewed the chart today for Ms. Carolyn Zamora.   She is admitted with abdominal pain and seen by nephrology for AKI/CKD.    She had mild AKI on admission secondary to volume depletion in setting of diuretic use in face of watery diarrhea.  (Please see yesterday's note for full details.)  Her creatinine and UOP have improved with IVF.  Gentle hydration can be continued until she has normal oral intake.  Her diarrhea had resolved by the time I saw her yesterday.   She is close to her usual baseline renal function (in fact better than outpatient labs obtained about 3 weeks ago) and is OK for discharge from my perspective.  She already has follow up with her outpatient nephrologist in the next few weeks.   Please page me with concerns.  I will sign off.  Jannifer Hick MD

## 2017-12-17 NOTE — Progress Notes (Signed)
PROGRESS NOTE    Carolyn Zamora  TKW:[id number] DOB: 29-Nov-1955 DOA: 12/15/2017 PCP: Beckie Salts, MD  Brief Narrative: 62 y.o.femalewith medical history significant ofchronic kidney disease stage III, history of renal cell carcinoma status post left nephrectomy, history of vitamin B12 deficiency, history of splenectomy, chronic pain, morbid obesity, thrombocytopenia, lupus, chronic immunosuppressive medications of CellCept, hydroxychloroquine, chronic prednisone presenting to [facility] from PCPs office with a one-week history of worsening black loose stools, generalized weakness, chronic left-sided abdominal pain which has worsened. Patient denies any fevers, no chills, no chest pain, no constipation, no nausea, no vomiting, no hematemesis, no hematochezia. Patient does endorse some shortness of breath. Patient denies any recent use of antibiotics. No recent change in her diet. No dysuria. No change in chronic blurry vision. Patient does endorse ongoing diarrhea for 2 months that has since gotten worse over the past week. Patient does endorse compliance with all her medications. Patient was seen in the ED at the [facility]. ED Course:comprehensive metabolic profile obtained had a creatinine of 2.35, potassium of 3.5, bicarb of 19 otherwise was within normal limits. CBC done had a hemoglobin of 11.1 otherwise was within normal limits. Urinalysis done was moderate leukocytes, nitrite negative, many bacteria, present WBC clumps, 21-50 WBCs. Urine cultures pending. Patient given a dose of IV Rocephin. Hospitalist were called to admit the patient for further evaluation and management.  Assessment & Plan:   Principal Problem:   Acute renal failure superimposed on stage 3 chronic kidney disease (HCC) Active Problems:   UTI (urinary tract infection)   Hypertension   Systemic lupus erythematosus (HCC)   Abdominal wall seroma - chronic from prior [facility] repair  2017   AVN (avascular necrosis of bone) (HCC)   Chronic pain associated with significant psychosocial dysfunction   Immunocompromised state ([place])   Chronic pain syndrome   Renal cell carcinoma of left kidney s/p nephrectomy   AKI (acute kidney injury) ([place])   Diarrhea   Dehydration   Anemia   Renal failure (ARF), acute on chronic (HCC)  1 AKI on chronic kidney disease stage III Baseline creatinine approximately 1.5. Patient presented with acute renal failure on chronic kidney disease stage III with a creatinine of 2.35 on presentation. Likely secondary to a prerenal azotemia secondary to GI losses from diarrhea and on Lasix. Patient status post left nephrectomy secondary to renal cell carcinoma.  Renal functions improving with slow IV hydration.  Today her creatinine is 1.96  down from 2.35 . Continue slow IV hydration.  Continue to hold Lasix.  Patient has a history of renal cell carcinoma and status post left nephrectomy and has a history of splenectomy.  She needs to be monitored in the hospital on IV fluids and her renal functions returns back to baseline. Appreciate nephrology input.  2] diarrhea -resolved.  3] anemia of chronic disease -FOBT negative.follow H&H no overt signs of bleeding.  Drop in hemoglobin from 11-9 noted probably secondary to hemodilution.  4] chronic pain syndrome patient requesting morphine and complaining of excruciating pain Of the left posterior back.  Reviewed the CT scan done on admission.  It shows reduced the size of the left anterior abdominal wall fluid collection, resolved left pleural effusion.  Changes consistent with left nephrectomy with oval-shaped density along the nephrectomy bed prior splenectomy with regenerative splenic tissues bilateral femoral head avascular necrosis, stable appearance of subluxation and endplate collapse at J2-E2 with impingement at L4-L5.  I did discuss with the patient that  I am going to stop the morphine and continue  Vicodin and that she needs to get out of bed to ambulate with physical therapy and she will be discharged home tomorrow.  5] questionable UTI -culture pending antibiotics stopped yesterday.no symptoms.  6] history of lupus continue CellCept hydroxychloroquine and prednisone.  7] hypertension patient does have a history of high blood pressure but not on any medications at this time blood pressure has been stable Lasix is on hold due to diarrhea decreased p.o. intake and dehydration and AKI.    Malnutrition Type:  Nutrition Problem: Inadequate oral intake Etiology: poor appetite   Malnutrition Characteristics:  Signs/Symptoms: per patient/family report   Nutrition Interventions:  Interventions: Magic cup, Liberalize Diet  Estimated body mass index is 33.32 kg/m as calculated from the following:   Height as of this encounter: 5\' 2"  (1.575 m).   Weight as of this encounter: 82.6 kg.  DVT prophylaxis: lovenox Code Status:full Family Communication:none Disposition Plan: plan dc in am   Consultants:   renal  Procedures:none Antimicrobials: none Subjective: Patient complains of severe pain and continues to request morphine.   Objective: Eating breakfast comfortably she does not want to be disturbed staff reports patient refused physical therapy however she walks in the room by herself. Vitals:   12/16/17 1327 12/16/17 2157 12/16/17 2201 12/17/17 0655  BP: (!) 131/98  (!) 156/93 (!) 157/87  Pulse: (!) 102 100 (!) 102 98  Resp: 15  16 20   Temp: 98.4 F (36.9 C)  98.4 F (36.9 C) 98.7 F (37.1 C)  TempSrc: Oral  Oral Oral  SpO2: 99% 100% 100% 100%  Weight:    82.6 kg  Height:        Intake/Output Summary (Last 24 hours) at 12/17/2017 1049 Last data filed at 12/17/2017 1006 Gross per 24 hour  Intake 560 ml  Output 1150 ml  Net -590 ml   Filed Weights   12/15/17 1110 12/16/17 0500 12/17/17 0655  Weight: 77.1 kg 82.3 kg 82.6 kg    Examination:  General  exam: Appears calm and comfortable  Respiratory system: Clear to auscultation. Respiratory effort normal. Cardiovascular system: S1 & S2 heard, RRR. No JVD, murmurs, rubs, gallops or clicks. No pedal edema. Gastrointestinal system: Abdomen is nondistended, soft and nontender. No organomegaly or masses felt. Normal bowel sounds heard. Central nervous system: Alert and oriented. No focal neurological deficits. Extremities: Symmetric 5 x 5 power. Skin: No rashes, lesions or ulcers Psychiatry: Judgement and insight appear normal. Mood & affect appropriate.     Data Reviewed: I have personally reviewed following labs and imaging studies  CBC: Recent Labs  Lab 12/15/17 1134 12/16/17 0406 12/17/17 0333  WBC 7.9 4.8 8.2  NEUTROABS  --  2.1  --   HGB 11.1* 9.2* 9.6*  HCT 37.2 31.6* 32.3*  MCV 75.6* 76.3* 74.9*  PLT 205 175 093   Basic Metabolic Panel: Recent Labs  Lab 12/15/17 1134 12/16/17 0944 12/17/17 0333  NA 137 141 140  K 3.5 3.9 4.6  CL 107 114* 117*  CO2 19* 19* 16*  GLUCOSE 76 131* 96  BUN 32* 25* 20  CREATININE 2.35* 2.13* 1.96*  CALCIUM 8.5* 8.0* 8.1*   GFR: Estimated Creatinine Clearance: 30.4 mL/min (A) (by C-G formula based on SCr of 1.96 mg/dL (H)). Liver Function Tests: Recent Labs  Lab 12/15/17 1134  AST 40  ALT 32  ALKPHOS 70  BILITOT 0.6  PROT 7.0  ALBUMIN 4.0   Recent Labs  Lab 12/15/17 1134  LIPASE 28   No results for input(s): AMMONIA in the last 168 hours. Coagulation Profile: No results for input(s): INR, PROTIME in the last 168 hours. Cardiac Enzymes: No results for input(s): CKTOTAL, CKMB, CKMBINDEX, TROPONINI in the last 168 hours. BNP (last 3 results) No results for input(s): PROBNP in the last 8760 hours. HbA1C: No results for input(s): HGBA1C in the last 72 hours. CBG: No results for input(s): GLUCAP in the last 168 hours. Lipid Profile: No results for input(s): CHOL, HDL, LDLCALC, TRIG, CHOLHDL, LDLDIRECT in the last 72  hours. Thyroid Function Tests: Recent Labs    12/15/17 1917  TSH 5.899*   Anemia Panel: No results for input(s): VITAMINB12, FOLATE, FERRITIN, TIBC, IRON, RETICCTPCT in the last 72 hours. Sepsis Labs: No results for input(s): PROCALCITON, LATICACIDVEN in the last 168 hours.  Recent Results (from the past 240 hour(s))  Urine culture     Status: None (Preliminary result)   Collection Time: 12/15/17  2:30 PM  Result Value Ref Range Status   Specimen Description   Final    URINE, CLEAN CATCH Performed at [facility], [address] [place], [place] [address]    Special Requests   Final    NONE Performed at [facility], [address] [place], [place] [address]    Culture   Final    CULTURE REINCUBATED FOR BETTER GROWTH Performed at [facility], [place] [address] [place], [place] [address]    Report Status PENDING  Incomplete  Culture, blood (Routine X 2) w Reflex to ID Panel     Status: None (Preliminary result)   Collection Time: 12/15/17  7:17 PM  Result Value Ref Range Status   Specimen Description   Final    BLOOD LEFT ARM Performed at [facility], [place] [address] [place], [place] [address]    Special Requests   Final    BOTTLES DRAWN AEROBIC AND ANAEROBIC Blood Culture adequate volume Performed at [facility] [address], [place], [place] [address]    Culture   Final    NO GROWTH < 12 HOURS Performed at [facility] [address], [place], [place] [address]    Report Status PENDING  Incomplete  Culture, blood (Routine X 2) w Reflex to ID Panel     Status: None (Preliminary result)   Collection Time: 12/15/17  7:18 PM  Result Value Ref Range Status   Specimen Description   Final    BLOOD LEFT ARM Performed at [facility], [place] [address], [place], [place] [address]    Special Requests   Final    BOTTLES DRAWN AEROBIC ONLY Blood Culture adequate volume Performed at [facility] [address] [place], [place] [address]    Culture   Final    NO GROWTH < 12 HOURS Performed at [facility] [address], [place], [place] [address]    Report Status PENDING  Incomplete  MRSA PCR Screening     Status: None   Collection Time: 12/16/17  1:09 AM  Result Value Ref Range Status   MRSA by PCR NEGATIVE NEGATIVE Final    Comment:        The GeneXpert MRSA Assay (FDA approved for NASAL specimens only), is one component of a comprehensive MRSA colonization surveillance program. It is not intended to diagnose MRSA infection nor to guide or monitor treatment  for MRSA infections. Performed at [facility], [place] [address] [place], [place] [address]          Radiology Studies: Ct Abdomen Pelvis Wo Contrast  Result Date: 12/15/2017 CLINICAL DATA:  Abdominal pain and diarrhea for a week. Black stool. Splenectomy, renal cell carcinoma s/p left nephrectomy, Lupus, HTN, CKD, AVN, ventral hernia. EXAM: CT ABDOMEN AND PELVIS WITHOUT CONTRAST TECHNIQUE: Multidetector CT imaging of the abdomen and pelvis was performed following the standard protocol without IV contrast. COMPARISON:  07/31/2017 FINDINGS: Lower chest: Descending thoracic aortic atherosclerotic calcification. The left pleural effusion has resolved but there is scarring and some volume loss at the left lung base with associated mild nodularity along the scarring. Mild pleural thickening or nodularity inferiorly along the right lower lobe side of the right major fissure, approximately 0.8 cm in thickness, stable from 07/31/2017. Hepatobiliary: Streak artifact from the patient's right arm positioning partially obscures the liver. Borderline distended gallbladder. Pancreas: Unremarkable Spleen: Regenerative splenic tissue noted similar to prior. Adrenals/Urinary Tract: Stable nodularity along the nephrectomy site measuring 2.3 by 0.9 cm. Adrenal glands unremarkable. No right-sided urinary  tract calculi. No hydronephrosis. Stomach/Bowel: No findings of diverticulosis or appreciable diverticulitis. The cecum is mobile and in the left abdomen Vascular/Lymphatic: Aortoiliac atherosclerotic vascular disease. No appreciable adenopathy. Reproductive: Convex lower uterine contour possibly from fibroid but technically nonspecific. Calcifications along the right adnexa possibly from a subserosal fibroid or in the ovary. These are unchanged. Other: Superficial anterior abdominal wall fluid collection measures 8.5 by 1.7 cm on image 32/2, formerly 9.8 by 2.3 cm. Musculoskeletal: Bilateral femoral head avascular necrosis without appreciable collapse. Degenerative loss of articular space in both hips. 13 mm of degenerative anterolisthesis at L4-5 with partial collapse of the L4 and L5 endplates leading to mild vertebral interdigitation on image 57/3. 5 mm degenerative retrolisthesis at L5-S1. There is resulting foraminal impingement at the L4-5 level, left greater than right. IMPRESSION: 1. A cause for blood in the stool and diarrhea is not identified on today's noncontrast CT examination. 2. Mobile cecum in the left upper quadrant. 3. Reduced size of the left anterior abdominal wall fluid collection. 4. Resolved left pleural effusion although there is continued scarring in the left lower lobe. Minimal nodularity along this scarring and along the right lower lobe hemidiaphragmatic margin with the major fissure, nonspecific. 5. Left nephrectomy with chronic oval-shaped density along the nephrectomy bed which may simply be postoperative findings. 6. Prior splenectomy with regenerative splenic tissues. 7.  Aortic Atherosclerosis (ICD10-I70.0). 8. Suspected uterine fibroids. 9. Bilateral femoral head AVN. 10. Stable appearance of subluxations and endplate collapse at L4 and L5 with resulting considerable impingement at L4-5. Electronically Signed   By: Van Clines M.D.   On: 12/15/2017 13:09   US  Renal  Result Date: 12/16/2017 CLINICAL DATA:  62 year old with acute renal failure. History of left nephrectomy. EXAM: RENAL / URINARY TRACT ULTRASOUND COMPLETE COMPARISON:  CT 12/15/2017 FINDINGS: Right Kidney: Renal measurements: 9.3 x 4.0 x 4.6 cm = volume: 90 mL. Normal echogenicity. No significant cortical thinning. No hydronephrosis. Small hypoechoic structure within the right kidney measures up to 0.8 cm and suggestive for small cyst. Left Kidney: Surgically removed. Bladder: Appears normal for degree of bladder distention. IMPRESSION: Normal appearance of the right kidney.  Negative for hydronephrosis. Small right renal cyst. Electronically Signed   By: Markus Daft M.D.   On: 12/16/2017 12:25        Scheduled Meds: . DULoxetine  60 mg Oral  Daily  . heparin  5,000 Units Subcutaneous Q8H  . hydroxychloroquine  200 mg Oral Daily  . lidocaine  1 patch Transdermal Q24H  . mycophenolate  500 mg Oral Daily  . predniSONE  10 mg Oral QAC breakfast  . pregabalin  150 mg Oral Daily  . sodium chloride flush  3 mL Intravenous Q12H   Continuous Infusions: . sodium chloride 75 mL/hr at 12/16/17 2235     LOS: 2 days     Georgette Shell, MD  If 7PM-7AM, please contact night-coverage [email or web address] Password [facility] 12/17/2017, 10:49 AM

## 2017-12-17 NOTE — Evaluation (Signed)
Occupational Therapy Evaluation Patient Details Name: Carolyn Zamora MRN: [id number] DOB: 01/15/1956 Today's Date: 12/17/2017    History of Present Illness Pt admitted with abdominal pain and diarrhea and dx with UTI and acute renal failure.  Pt with hx of Lupus, Renal cell CA s/p nephrectomy, AVN bilat hips, L4-5 impingement and ORIF L ankle (17)   Clinical Impression     Pt admitted as above and presenting with functional mobility limitations 2* generalized weakness, balance deficits and limited WB tolerance 2* foot/hip pain. This date, pt willing only to move bed to chair stating it was "too painful to walk without her shoes on" - pt states shoes not in hospital.  Pt plans to dc home with assist of family and could benefit from follow up HHPT but may not accept services stating "I've been doing this for thirteen years and will manage fine at home".     Follow Up Recommendations  Home health OT          Precautions / Restrictions Precautions Precautions: Fall Restrictions Weight Bearing Restrictions: No      Mobility Bed Mobility Overal bed mobility: Modified Independent             General bed mobility comments: Pt to EOB unassisted  Transfers Overall transfer level: Needs assistance Equipment used: None Transfers: Sit to/from Stand;Stand Pivot Transfers Sit to Stand: Min assist Stand pivot transfers: Min assist;Mod assist       General transfer comment: steady assist only to stand but increased assist to balance and support on transfer bed to chair - pt states again needs shoes to walk    Balance Overall balance assessment: Needs assistance Sitting-balance support: No upper extremity supported;Feet supported Sitting balance-Leahy Scale: Good     Standing balance support: No upper extremity supported Standing balance-Leahy Scale: Fair                             ADL either performed or assessed with clinical judgement   ADL Overall ADL's  : Needs assistance/impaired Eating/Feeding: Independent;Sitting   Grooming: Set up;Sitting   Upper Body Bathing: Set up;Sitting   Lower Body Bathing: Minimal assistance;Sit to/from stand;Cueing for safety   Upper Body Dressing : Set up;Sitting   Lower Body Dressing: Minimal assistance;Sit to/from stand;Cueing for safety   Toilet Transfer: Supervision/safety;Cueing for sequencing;Cueing for safety   Toileting- Clothing Manipulation and Hygiene: Sit to/from stand;Cueing for safety;Minimal assistance                            Pertinent Vitals/Pain Pain Assessment: Faces Faces Pain Scale: Hurts even more Pain Location: abdomen; LEs/feet with weight bearing Pain Descriptors / Indicators: Aching;Sore Pain Intervention(s): Limited activity within patient's tolerance;Repositioned     Hand Dominance     Extremity/Trunk Assessment Upper Extremity Assessment Upper Extremity Assessment: Generalized weakness   Lower Extremity Assessment Lower Extremity Assessment: Generalized weakness       Communication Communication Communication: No difficulties   Cognition Arousal/Alertness: Awake/alert Behavior During Therapy: WFL for tasks assessed/performed Overall Cognitive Status: Within Functional Limits for tasks assessed                                                Home Living Family/patient expects to be discharged to:: Private residence Living  Arrangements: Children Available Help at Discharge: Available PRN/intermittently;Available 24 hours/day Type of Home: Apartment Home Access: Level entry     Home Layout: One level     Bathroom Shower/Tub: Walk-in shower         Home Equipment: Environmental consultant - 4 wheels;Shower seat;Wheelchair - manual;Bedside commode   Additional Comments: brother stops by daily, daughter works, family helps when she needs it       Prior Functioning/Environment Level of Independence: Independent with assistive  device(s);Independent        Comments: reports had been driving and using RW or wc as needed to mobilize at home         OT Problem List: Decreased strength;Decreased activity tolerance;Decreased safety awareness;Decreased knowledge of precautions;Decreased knowledge of use of DME or AE;Impaired balance (sitting and/or standing)      OT Treatment/Interventions: Self-care/ADL training;Patient/family education;DME and/or AE instruction;Therapeutic activities    OT Goals(Current goals can be found in the care plan section) Acute Rehab OT Goals Patient Stated Goal: HOME OT Goal Formulation: With patient Time For Goal Achievement: 12/31/17 ADL Goals Pt Will Perform Grooming: standing;with modified independence Pt Will Perform Lower Body Dressing: with modified independence;sit to/from stand Pt Will Transfer to Toilet: with modified independence;regular height toilet;ambulating Pt Will Perform Toileting - Clothing Manipulation and hygiene: with modified independence;sit to/from stand  OT Frequency: Min 2X/week   Barriers to D/C:            Co-evaluation   Reason for Co-Treatment: For patient/therapist safety PT goals addressed during session: Mobility/safety with mobility OT goals addressed during session: ADL's and self-care      AM-PAC PT "6 Clicks" Daily Activity     Outcome Measure Help from another person eating meals?: None Help from another person taking care of personal grooming?: None Help from another person toileting, which includes using toliet, bedpan, or urinal?: A Little Help from another person bathing (including washing, rinsing, drying)?: A Little Help from another person to put on and taking off regular upper body clothing?: A Little Help from another person to put on and taking off regular lower body clothing?: A Lot 6 Click Score: 19   End of Session Nurse Communication: Mobility status  Activity Tolerance: Patient tolerated treatment well(pt self  limiting) Patient left: in chair  OT Visit Diagnosis: Unsteadiness on feet (R26.81);Other abnormalities of gait and mobility (R26.89);History of falling (Z91.81)                Time: 2620-3559 OT Time Calculation (min): 11 min Charges:  OT General Charges $OT Visit: 1 Visit OT Evaluation $OT Eval Moderate Complexity: 1 Mod  Kari Baars, [place] [phone] Office- [phone], Edwena Felty D 12/17/2017, 3:59 PM

## 2017-12-18 DIAGNOSIS — D631 Anemia in chronic kidney disease: Secondary | ICD-10-CM

## 2017-12-18 LAB — CBC
HCT: 33 % — ABNORMAL LOW (ref 36.0–46.0)
Hemoglobin: 9.9 g/dL — ABNORMAL LOW (ref 12.0–15.0)
MCH: 22.9 pg — ABNORMAL LOW (ref 26.0–34.0)
MCHC: 30 g/dL (ref 30.0–36.0)
MCV: 76.4 fL — ABNORMAL LOW (ref 80.0–100.0)
PLATELETS: 190 10*3/uL (ref 150–400)
RBC: 4.32 MIL/uL (ref 3.87–5.11)
RDW: 16.9 % — AB (ref 11.5–15.5)
WBC: 8.6 10*3/uL (ref 4.0–10.5)
nRBC: 0.5 % — ABNORMAL HIGH (ref 0.0–0.2)

## 2017-12-18 LAB — BASIC METABOLIC PANEL
Anion gap: 6 (ref 5–15)
BUN: 20 mg/dL (ref 6–20)
CO2: 19 mmol/L — ABNORMAL LOW (ref 22–32)
CREATININE: 1.7 mg/dL — AB (ref 0.44–1.00)
Calcium: 8.2 mg/dL — ABNORMAL LOW (ref 8.9–10.3)
Chloride: 116 mmol/L — ABNORMAL HIGH (ref 98–111)
GFR calc Af Amer: 37 mL/min — ABNORMAL LOW (ref >60)
GFR, EST NON AFRICAN AMERICAN: 32 mL/min — AB (ref >60)
Glucose, Bld: 88 mg/dL (ref 70–99)
POTASSIUM: 4.7 mmol/L (ref 3.5–5.1)
SODIUM: 141 mmol/L (ref 135–145)

## 2017-12-18 MED ORDER — PREGABALIN 150 MG PO CAPS: 150.0000 mg | ORAL_CAPSULE | Freq: Every day | ORAL | 0 refills | Status: DC

## 2017-12-18 MED ORDER — LIDOCAINE 5 % EX PTCH: 1.0000 | MEDICATED_PATCH | CUTANEOUS | 0 refills | Status: DC

## 2017-12-18 NOTE — Progress Notes (Signed)
Occupational Therapy Treatment Patient Details Name: Carolyn Zamora MRN: [id number] DOB: 1955-11-01 Today's Date: 12/18/2017    History of present illness Pt admitted with abdominal pain and diarrhea and dx with UTI and acute renal failure.  Pt with hx of Lupus, Renal cell CA s/p nephrectomy, AVN bilat hips, L4-5 impingement and ORIF L ankle (17)   OT comments  Pt MUCH improved this day! Feels ready to go home and have children a her  Follow Up Recommendations  Home health OT;Supervision - Intermittent          Precautions / Restrictions Precautions Precautions: Fall Restrictions Weight Bearing Restrictions: No       Mobility Bed Mobility Overal bed mobility: Modified Independent             General bed mobility comments: Pt to EOB unassisted  Transfers Overall transfer level: Needs assistance Equipment used: None   Sit to Stand: Supervision Stand pivot transfers: Supervision       General transfer comment: pt got OOB in her socks and to the [facility] - overall S- mod I        ADL either performed or assessed with clinical judgement   ADL       Grooming: Standing;Supervision/safety               Lower Body Dressing: Supervision/safety;Sit to/from stand   Toilet Transfer: Supervision/safety;Stand-pivot;BSC   Toileting- Water quality scientist and Hygiene: Supervision/safety;Sit to/from stand;Cueing for sequencing;Cueing for safety       Functional mobility during ADLs: Supervision/safety;Rolling walker General ADL Comments: Pt MUCH improved!!! Pt states her friends prayed for her and she is much better. Educated on safety with ADL Activity and fall prevention. Pt verbalized understanding     Vision Patient Visual Report: No change from baseline            Cognition Arousal/Alertness: Awake/alert Behavior During Therapy: WFL for tasks assessed/performed Overall Cognitive Status: Within Functional Limits for tasks assessed                                                     Pertinent Vitals/ Pain       Faces Pain Scale: No hurt         Frequency  Min 2X/week        Progress Toward Goals  OT Goals(current goals can now be found in the care plan section)  Progress towards OT goals: Progressing toward goals     Plan Discharge plan remains appropriate       AM-PAC PT "6 Clicks" Daily Activity     Outcome Measure   Help from another person eating meals?: None Help from another person taking care of personal grooming?: None Help from another person toileting, which includes using toliet, bedpan, or urinal?: A Little Help from another person bathing (including washing, rinsing, drying)?: A Little Help from another person to put on and taking off regular upper body clothing?: A Little Help from another person to put on and taking off regular lower body clothing?: A Little 6 Click Score: 20    End of Session    OT Visit Diagnosis: Unsteadiness on feet (R26.81);Other abnormalities of gait and mobility (R26.89);History of falling (Z91.81)   Activity Tolerance Patient tolerated treatment well(pt self limiting)   Patient Left in chair   Nurse Communication Mobility status  Time: 3010-4045 OT Time Calculation (min): 17 min  Charges: OT General Charges $OT Visit: 1 Visit OT Treatments $Self Care/Home Management : 8-22 mins  Kari Baars, [place] [phone] Office- [phone], Edwena Felty D 12/18/2017, 12:16 PM

## 2017-12-18 NOTE — Care Management (Addendum)
This CM spoke with pt at bedside about DC planning. Pt politely declines any home health services at this time. She also states she has all the DME she needs. Marney Doctor RN,BSN [phone]

## 2017-12-18 NOTE — Discharge Summary (Signed)
Physician Discharge Summary  Carolyn Zamora MWN:[id number] DOB: July 11, 1955 DOA: 12/15/2017  PCP: Beckie Salts, MD  Admit date: 12/15/2017 Discharge date: 12/18/2017  Admitted From: Home Disposition: Home  Recommendations for Outpatient Follow-up:  1. Follow up with PCP in 1-2 weeks 2. Please obtain BMP/CBC in one week  Home Health none Equipment/Devices: None Discharge Condition stable CODE STATUS full code Diet recommendation: Renal cardiac  Brief/Interim Summary:62 y.o.femalewith medical history significant ofchronic kidney disease stage III, history of renal cell carcinoma status post left nephrectomy, history of vitamin B12 deficiency, history of splenectomy, chronic pain, morbid obesity, thrombocytopenia, lupus, chronic immunosuppressive medications of CellCept, hydroxychloroquine, chronic prednisone presenting to [place] from PCPs office with a one-week history of worsening black loose stools, generalized weakness, chronic left-sided abdominal pain which has worsened. Patient denies any fevers, no chills, no chest pain, no constipation, no nausea, no vomiting, no hematemesis, no hematochezia. Patient does endorse some shortness of breath. Patient denies any recent use of antibiotics. No recent change in her diet. No dysuria. No change in chronic blurry vision. Patient does endorse ongoing diarrhea for 2 months that has since gotten worse over the past week. Patient does endorse compliance with all her medications. Patient was seen in [facility] ED at the [facility]. ED Course:comprehensive metabolic profile obtained had a creatinine of 2.35, potassium of 3.5, bicarb of 19 otherwise was within normal limits. CBC done had a hemoglobin of 11.1 otherwise was within normal limits. Urinalysis done was moderate leukocytes, nitrite negative, many bacteria, present WBC clumps, 21-50 WBCs. Urine cultures pending. Patient given a dose of IV Rocephin. Hospitalist  were called to admit the patient for further evaluation and management.   Discharge Diagnoses:  Principal Problem:   Acute renal failure superimposed on stage 3 chronic kidney disease (HCC) Active Problems:   UTI (urinary tract infection)   Hypertension   Systemic lupus erythematosus (HCC)   Abdominal wall seroma - chronic from prior [facility] repair 2017   AVN (avascular necrosis of bone) (HCC)   Chronic pain associated with significant psychosocial dysfunction   Immunocompromised state ([place])   Chronic pain syndrome   Renal cell carcinoma of left kidney s/p nephrectomy   AKI (acute kidney injury) ([place])   Diarrhea   Dehydration   Anemia   Renal failure (ARF), acute on chronic (HCC)  1AKIon chronic kidney disease stage III-patient was treated with IV fluids her renal functions returned back to normal.  She was seen in consultation by nephrology who recommended IV hydration.  AKI was thought to be secondary to GI loss from diarrhea in addition to Lasix.  Lasix was held during this hospital stay.  2]diarrhea -resolved.  She did not have any episodes of diarrhea since the hospital admission till discharge.  3]anemia of chronic disease-hemoglobin remained stable with appropriate drop secondary to hemodilution from IV hydration.  Will be done was negative.  4]chronic pain syndrome patient requesting morphine and complaining of excruciating pain Of the left posterior back. Reviewed the CT scan done on admission. It shows reduced the size of the left anterior abdominal wall fluid collection, resolved left pleural effusion. Changes consistent with left nephrectomy with oval-shaped density along the nephrectomy bed prior splenectomy with regenerative splenic tissues bilateral femoral head avascular necrosis, stable appearance of subluxation and endplate collapse at Q0-H4 with impingement at L4-L5.   5]questionable UTI -culture pending antibiotics stopped.  Patient  asymptomatic.  6]history of lupus continue CellCept hydroxychloroquine and prednisone.  7]hypertension Home meds restarted.  Discharge Instructions  Discharge Instructions    Call MD for:  difficulty breathing, headache or visual disturbances   Complete by:  As directed    Call MD for:  persistant nausea and vomiting   Complete by:  As directed    Diet - low sodium heart healthy   Complete by:  As directed    Increase activity slowly   Complete by:  As directed      Allergies as of 12/18/2017   No Known Allergies     Medication List    TAKE these medications   amLODipine 10 MG tablet Commonly known as:  NORVASC Take 10 mg by mouth daily.   DULoxetine 30 MG capsule Commonly known as:  CYMBALTA Take 60 mg by mouth daily.   furosemide 40 MG tablet Commonly known as:  LASIX Take 40 mg by mouth daily.   HYDROcodone-acetaminophen 5-325 MG tablet Commonly known as:  NORCO/VICODIN Take 1 tablet by mouth every 8 (eight) hours as needed for pain.   hydroxychloroquine 200 MG tablet Commonly known as:  PLAQUENIL Take 200 mg by mouth daily.   hydrOXYzine 50 MG tablet Commonly known as:  ATARAX/VISTARIL Take 50 mg by mouth 3 (three) times daily as needed for nausea.   lidocaine 5 % Commonly known as:  LIDODERM Place 1 patch onto the skin daily. Remove & Discard patch within 12 hours or as directed by MD   mycophenolate 500 MG tablet Commonly known as:  CELLCEPT Take 500 mg by mouth daily.   predniSONE 10 MG tablet Commonly known as:  DELTASONE Take 10 mg by mouth daily with breakfast.   pregabalin 150 MG capsule Commonly known as:  LYRICA Take 1 capsule (150 mg total) by mouth daily. What changed:  when to take this   traZODone 50 MG tablet Commonly known as:  DESYREL Take 50 mg by mouth daily.   Vitamin D (Ergocalciferol) 1.25 MG (50000 UT) Caps capsule Commonly known as:  DRISDOL Take 1 capsule by mouth once a week.       No Known  Allergies  Consultations:  Nephrology   Procedures/Studies: Ct Abdomen Pelvis Wo Contrast  Result Date: 12/15/2017 CLINICAL DATA:  Abdominal pain and diarrhea for a week. Black stool. Splenectomy, renal cell carcinoma s/p left nephrectomy, Lupus, HTN, CKD, AVN, ventral hernia. EXAM: CT ABDOMEN AND PELVIS WITHOUT CONTRAST TECHNIQUE: Multidetector CT imaging of the abdomen and pelvis was performed following the standard protocol without IV contrast. COMPARISON:  07/31/2017 FINDINGS: Lower chest: Descending thoracic aortic atherosclerotic calcification. The left pleural effusion has resolved but there is scarring and some volume loss at the left lung base with associated mild nodularity along the scarring. Mild pleural thickening or nodularity inferiorly along the right lower lobe side of the right major fissure, approximately 0.8 cm in thickness, stable from 07/31/2017. Hepatobiliary: Streak artifact from the patient's right arm positioning partially obscures the liver. Borderline distended gallbladder. Pancreas: Unremarkable Spleen: Regenerative splenic tissue noted similar to prior. Adrenals/Urinary Tract: Stable nodularity along the nephrectomy site measuring 2.3 by 0.9 cm. Adrenal glands unremarkable. No right-sided urinary tract calculi. No hydronephrosis. Stomach/Bowel: No findings of diverticulosis or appreciable diverticulitis. The cecum is mobile and in the left abdomen Vascular/Lymphatic: Aortoiliac atherosclerotic vascular disease. No appreciable adenopathy. Reproductive: Convex lower uterine contour possibly from fibroid but technically nonspecific. Calcifications along the right adnexa possibly from a subserosal fibroid or in the ovary. These are unchanged. Other: Superficial anterior abdominal wall fluid collection measures 8.5 by 1.7 cm on  image 32/2, formerly 9.8 by 2.3 cm. Musculoskeletal: Bilateral femoral head avascular necrosis without appreciable collapse. Degenerative loss of articular  space in both hips. 13 mm of degenerative anterolisthesis at L4-5 with partial collapse of the L4 and L5 endplates leading to mild vertebral interdigitation on image 57/3. 5 mm degenerative retrolisthesis at L5-S1. There is resulting foraminal impingement at the L4-5 level, left greater than right. IMPRESSION: 1. A cause for blood in the stool and diarrhea is not identified on today's noncontrast CT examination. 2. Mobile cecum in the left upper quadrant. 3. Reduced size of the left anterior abdominal wall fluid collection. 4. Resolved left pleural effusion although there is continued scarring in the left lower lobe. Minimal nodularity along this scarring and along the right lower lobe hemidiaphragmatic margin with the major fissure, nonspecific. 5. Left nephrectomy with chronic oval-shaped density along the nephrectomy bed which may simply be postoperative findings. 6. Prior splenectomy with regenerative splenic tissues. 7.  Aortic Atherosclerosis (ICD10-I70.0). 8. Suspected uterine fibroids. 9. Bilateral femoral head AVN. 10. Stable appearance of subluxations and endplate collapse at L4 and L5 with resulting considerable impingement at L4-5. Electronically Signed   By: Van Clines M.D.   On: 12/15/2017 13:09   US Renal  Result Date: 12/16/2017 CLINICAL DATA:  62 year old with acute renal failure. History of left nephrectomy. EXAM: RENAL / URINARY TRACT ULTRASOUND COMPLETE COMPARISON:  CT 12/15/2017 FINDINGS: Right Kidney: Renal measurements: 9.3 x 4.0 x 4.6 cm = volume: 90 mL. Normal echogenicity. No significant cortical thinning. No hydronephrosis. Small hypoechoic structure within the right kidney measures up to 0.8 cm and suggestive for small cyst. Left Kidney: Surgically removed. Bladder: Appears normal for degree of bladder distention. IMPRESSION: Normal appearance of the right kidney.  Negative for hydronephrosis. Small right renal cyst. Electronically Signed   By: Markus Daft M.D.   On: 12/16/2017  12:25    (Echo, Carotid, EGD, Colonoscopy, ERCP)    Subjective:   Discharge Exam: Vitals:   12/17/17 2132 12/18/17 0601  BP: (!) 158/93 (!) 153/138  Pulse: 100 97  Resp: 15 16  Temp: 100 F (37.8 C) 98.8 F (37.1 C)  SpO2: 97% 94%   Vitals:   12/17/17 1124 12/17/17 1448 12/17/17 2132 12/18/17 0601  BP:  (!) 156/89 (!) 158/93 (!) 153/138  Pulse:  (!) 105 100 97  Resp:  18 15 16   Temp:  98.2 F (36.8 C) 100 F (37.8 C) 98.8 F (37.1 C)  TempSrc:  Oral Oral Oral  SpO2: 98% 100% 97% 94%  Weight:      Height:        General: Pt is alert, awake, not in acute distress Cardiovascular: RRR, S1/S2 +, no rubs, no gallops Respiratory: CTA bilaterally, no wheezing, no rhonchi Abdominal: Soft, NT, ND, bowel sounds + Extremities: no edema, no cyanosis    The results of significant diagnostics from this hospitalization (including imaging, microbiology, ancillary and laboratory) are listed below for reference.     Microbiology: Recent Results (from the past 240 hour(s))  Urine culture     Status: None (Preliminary result)   Collection Time: 12/15/17  2:30 PM  Result Value Ref Range Status   Specimen Description   Final    URINE, CLEAN CATCH Performed at [facility], [place] [place], [place] [address]    Special Requests   Final    NONE Performed at [facility], [address], [place], [place] [address]  Culture   Final    CULTURE REINCUBATED FOR BETTER GROWTH Performed at [facility], [place] [address] [place], [place] [address]    Report Status PENDING  Incomplete  Culture, blood (Routine X 2) w Reflex to ID Panel     Status: None (Preliminary result)   Collection Time: 12/15/17  7:17 PM  Result Value Ref Range Status   Specimen Description   Final    BLOOD LEFT ARM Performed at [facility], [place] [address], [place], [place] [address]    Special Requests   Final    BOTTLES DRAWN AEROBIC AND ANAEROBIC Blood Culture  adequate volume Performed at [facility] [address], [place], [place] [address]    Culture   Final    NO GROWTH 2 DAYS Performed at [facility] [address], [place], [place] [address]    Report Status PENDING  Incomplete  Culture, blood (Routine X 2) w Reflex to ID Panel     Status: None (Preliminary result)   Collection Time: 12/15/17  7:18 PM  Result Value Ref Range Status   Specimen Description   Final    BLOOD LEFT ARM Performed at [facility], [place] [address] [place], [place] [address]    Special Requests   Final    BOTTLES DRAWN AEROBIC ONLY Blood Culture adequate volume Performed at [facility] [address], [place], [place] [address]    Culture   Final    NO GROWTH 2 DAYS Performed at [facility] [address] [place], [place] [address]    Report Status PENDING  Incomplete  MRSA PCR Screening     Status: None   Collection Time: 12/16/17  1:09 AM  Result Value Ref Range Status   MRSA by PCR NEGATIVE NEGATIVE Final    Comment:        The GeneXpert MRSA Assay (FDA approved for NASAL specimens only), is one component of a comprehensive MRSA colonization surveillance program. It is not intended to diagnose MRSA infection nor to guide or monitor treatment for MRSA infections. Performed at [facility], [place] [address] [place] [address], [place], [place] [address]      Labs: BNP (last 3 results) No results for input(s): BNP in the last 8760 hours. Basic Metabolic Panel: Recent Labs  Lab 12/15/17 1134 12/16/17 0944 12/17/17 0333 12/18/17 0357  NA 137 141 140 141  K 3.5 3.9 4.6 4.7  CL 107 114* 117* 116*  CO2 19* 19* 16* 19*  GLUCOSE 76 131* 96 88  BUN 32* 25* 20 20  CREATININE 2.35* 2.13* 1.96* 1.70*  CALCIUM 8.5* 8.0* 8.1* 8.2*   Liver Function Tests: Recent Labs  Lab 12/15/17 1134  AST 40  ALT 32  ALKPHOS 70  BILITOT 0.6  PROT 7.0  ALBUMIN 4.0   Recent Labs  Lab  12/15/17 1134  LIPASE 28   No results for input(s): AMMONIA in the last 168 hours. CBC: Recent Labs  Lab 12/15/17 1134 12/16/17 0406 12/17/17 0333 12/18/17 0357  WBC 7.9 4.8 8.2 8.6  NEUTROABS  --  2.1  --   --   HGB 11.1* 9.2* 9.6* 9.9*  HCT 37.2 31.6* 32.3* 33.0*  MCV 75.6* 76.3* 74.9* 76.4*  PLT 205 175 150 190   Cardiac Enzymes: No results for input(s): CKTOTAL, CKMB, CKMBINDEX, TROPONINI in the last 168 hours. BNP: Invalid input(s): POCBNP CBG: No results for input(s): GLUCAP in the  last 168 hours. D-Dimer No results for input(s): DDIMER in the last 72 hours. Hgb A1c No results for input(s): HGBA1C in the last 72 hours. Lipid Profile No results for input(s): CHOL, HDL, LDLCALC, TRIG, CHOLHDL, LDLDIRECT in the last 72 hours. Thyroid function studies Recent Labs    12/15/17 1917  TSH 5.899*   Anemia work up No results for input(s): VITAMINB12, FOLATE, FERRITIN, TIBC, IRON, RETICCTPCT in the last 72 hours. Urinalysis    Component Value Date/Time   COLORURINE YELLOW 12/15/2017 1424   APPEARANCEUR CLEAR 12/15/2017 1424   LABSPEC 1.025 12/15/2017 1424   PHURINE 6.0 12/15/2017 1424   GLUCOSEU NEGATIVE 12/15/2017 1424   HGBUR NEGATIVE 12/15/2017 1424   BILIRUBINUR NEGATIVE 12/15/2017 1424   KETONESUR NEGATIVE 12/15/2017 1424   PROTEINUR NEGATIVE 12/15/2017 1424   NITRITE NEGATIVE 12/15/2017 1424   LEUKOCYTESUR MODERATE (A) 12/15/2017 1424   Sepsis Labs Invalid input(s): PROCALCITONIN,  WBC,  LACTICIDVEN Microbiology Recent Results (from the past 240 hour(s))  Urine culture     Status: None (Preliminary result)   Collection Time: 12/15/17  2:30 PM  Result Value Ref Range Status   Specimen Description   Final    URINE, CLEAN CATCH Performed at [facility], [place] [place], [place] [address]    Special Requests   Final    NONE Performed at [facility], [address] [place], [place] [address]    Culture   Final     CULTURE REINCUBATED FOR BETTER GROWTH Performed at [facility], [place] [address] [place], [place] [address]    Report Status PENDING  Incomplete  Culture, blood (Routine X 2) w Reflex to ID Panel     Status: None (Preliminary result)   Collection Time: 12/15/17  7:17 PM  Result Value Ref Range Status   Specimen Description   Final    BLOOD LEFT ARM Performed at [facility], [place] [address] [place], [place] [address]    Special Requests   Final    BOTTLES DRAWN AEROBIC AND ANAEROBIC Blood Culture adequate volume Performed at [facility] [address], [place], [place] [address]    Culture   Final    NO GROWTH 2 DAYS Performed at [facility] [address] [place], [place] [address]    Report Status PENDING  Incomplete  Culture, blood (Routine X 2) w Reflex to ID Panel     Status: None (Preliminary result)   Collection Time: 12/15/17  7:18 PM  Result Value Ref Range Status   Specimen Description   Final    BLOOD LEFT ARM Performed at [facility], [place] [address], [place], [place] [address]    Special Requests   Final    BOTTLES DRAWN AEROBIC ONLY Blood Culture adequate volume Performed at [facility] [address] [place], [place] [address]    Culture   Final    NO GROWTH 2 DAYS Performed at [facility] [address], [place], [place] [address]    Report Status PENDING  Incomplete  MRSA PCR Screening     Status: None   Collection Time: 12/16/17  1:09 AM  Result Value Ref Range Status   MRSA by PCR NEGATIVE NEGATIVE Final    Comment:        The GeneXpert MRSA Assay (FDA approved for NASAL specimens only), is one component of a comprehensive MRSA colonization surveillance program.  It is not intended to diagnose MRSA infection nor to guide or monitor treatment for MRSA infections. Performed at [facility], [place] [address], [place], [place] [address]      Time  coordinating discharge: 34  minutes  SIGNED:   Georgette Shell, MD  [facility] 12/18/2017, 8:41 AM If 7PM-7AM, please contact night-coverage [email or web address] Password [email or web address]

## 2017-12-18 NOTE — Care Management Important Message (Signed)
Important Message  Patient Details  Name: Carolyn Zamora MRN: [id number] Date of Birth: 05/09/1955   Medicare Important Message Given:  Yes    Kerin Salen 12/18/2017, 10:09 AMImportant Message  Patient Details  Name: Carolyn Zamora MRN: [id number] Date of Birth: 02-25-1955   Medicare Important Message Given:  Yes    Kerin Salen 12/18/2017, 10:09 AM

## 2017-12-20 LAB — URINE CULTURE: Culture: >=100000 — AB

## 2017-12-20 LAB — CULTURE, BLOOD (ROUTINE X 2)
Culture: NO GROWTH
Culture: NO GROWTH
Special Requests: ADEQUATE
Special Requests: ADEQUATE

## 2018-01-25 ENCOUNTER — Encounter (HOSPITAL_BASED_OUTPATIENT_CLINIC_OR_DEPARTMENT_OTHER): Payer: Self-pay | Admitting: *Deleted

## 2018-01-25 ENCOUNTER — Other Ambulatory Visit: Payer: Self-pay

## 2018-01-25 ENCOUNTER — Emergency Department (HOSPITAL_BASED_OUTPATIENT_CLINIC_OR_DEPARTMENT_OTHER)
Admission: EM | Admit: 2018-01-25 | Discharge: 2018-01-25 | Disposition: A | Discharge status: Home / Self Care | Payer: Medicare HMO | Attending: Emergency Medicine | Admitting: Emergency Medicine

## 2018-01-25 ENCOUNTER — Emergency Department (HOSPITAL_BASED_OUTPATIENT_CLINIC_OR_DEPARTMENT_OTHER): Payer: Medicare HMO

## 2018-01-25 DIAGNOSIS — N184 Chronic kidney disease, stage 4 (severe): Secondary | ICD-10-CM | POA: Diagnosis not present

## 2018-01-25 DIAGNOSIS — I129 Hypertensive chronic kidney disease with stage 1 through stage 4 chronic kidney disease, or unspecified chronic kidney disease: Secondary | ICD-10-CM | POA: Diagnosis not present

## 2018-01-25 DIAGNOSIS — R109 Unspecified abdominal pain: Secondary | ICD-10-CM | POA: Insufficient documentation

## 2018-01-25 DIAGNOSIS — G8929 Other chronic pain: Secondary | ICD-10-CM | POA: Diagnosis not present

## 2018-01-25 DIAGNOSIS — Z79899 Other long term (current) drug therapy: Secondary | ICD-10-CM | POA: Diagnosis not present

## 2018-01-25 DIAGNOSIS — R197 Diarrhea, unspecified: Secondary | ICD-10-CM | POA: Diagnosis not present

## 2018-01-25 DIAGNOSIS — Z905 Acquired absence of kidney: Secondary | ICD-10-CM | POA: Diagnosis not present

## 2018-01-25 DIAGNOSIS — Z85528 Personal history of other malignant neoplasm of kidney: Secondary | ICD-10-CM | POA: Insufficient documentation

## 2018-01-25 LAB — CBC
HCT: 34.3 % — ABNORMAL LOW (ref 36.0–46.0)
Hemoglobin: 10.1 g/dL — ABNORMAL LOW (ref 12.0–15.0)
MCH: 22.7 pg — ABNORMAL LOW (ref 26.0–34.0)
MCHC: 29.4 g/dL — ABNORMAL LOW (ref 30.0–36.0)
MCV: 77.3 fL — AB (ref 80.0–100.0)
NRBC: 0 % (ref 0.0–0.2)
PLATELETS: 238 10*3/uL (ref 150–400)
RBC: 4.44 MIL/uL (ref 3.87–5.11)
RDW: 17.9 % — ABNORMAL HIGH (ref 11.5–15.5)
WBC: 6.8 10*3/uL (ref 4.0–10.5)

## 2018-01-25 LAB — COMPREHENSIVE METABOLIC PANEL
ALT: 30 U/L (ref 0–44)
ANION GAP: 8 (ref 5–15)
AST: 36 U/L (ref 15–41)
Albumin: 3.9 g/dL (ref 3.5–5.0)
Alkaline Phosphatase: 60 U/L (ref 38–126)
BUN: 19 mg/dL (ref 8–23)
CALCIUM: 8.6 mg/dL — AB (ref 8.9–10.3)
CO2: 18 mmol/L — AB (ref 22–32)
Chloride: 111 mmol/L (ref 98–111)
Creatinine, Ser: 1.99 mg/dL — ABNORMAL HIGH (ref 0.44–1.00)
GFR calc non Af Amer: 26 mL/min — ABNORMAL LOW (ref >60)
GFR, EST AFRICAN AMERICAN: 31 mL/min — AB (ref >60)
Glucose, Bld: 86 mg/dL (ref 70–99)
Potassium: 4.1 mmol/L (ref 3.5–5.1)
SODIUM: 137 mmol/L (ref 135–145)
TOTAL PROTEIN: 6.3 g/dL — AB (ref 6.5–8.1)
Total Bilirubin: 0.4 mg/dL (ref 0.3–1.2)

## 2018-01-25 LAB — URINALYSIS, MICROSCOPIC (REFLEX)

## 2018-01-25 LAB — URINALYSIS, ROUTINE W REFLEX MICROSCOPIC
Bilirubin Urine: NEGATIVE
Glucose, UA: NEGATIVE mg/dL
Hgb urine dipstick: NEGATIVE
Ketones, ur: NEGATIVE mg/dL
LEUKOCYTES UA: NEGATIVE
NITRITE: NEGATIVE
PROTEIN: 30 mg/dL — AB
Specific Gravity, Urine: >1.03 — ABNORMAL HIGH (ref 1.005–1.030)
pH: 5.5 (ref 5.0–8.0)

## 2018-01-25 LAB — LIPASE, BLOOD: LIPASE: 33 U/L (ref 11–51)

## 2018-01-25 MED ORDER — DICYCLOMINE HCL 20 MG PO TABS: 20.0000 mg | ORAL_TABLET | Freq: Two times a day (BID) | ORAL | 0 refills | Status: AC | PRN

## 2018-01-25 MED ORDER — MORPHINE SULFATE (PF) 4 MG/ML IV SOLN
4.0000 mg | Freq: Once | INTRAVENOUS | Status: AC
  Administered 2018-01-25: 4 mg via INTRAVENOUS
  Filled 2018-01-25: qty 1

## 2018-01-25 MED ORDER — DICYCLOMINE HCL 10 MG PO CAPS
10.0000 mg | ORAL_CAPSULE | Freq: Once | ORAL | Status: AC
  Administered 2018-01-25: 10 mg via ORAL
  Filled 2018-01-25: qty 1

## 2018-01-25 NOTE — Discharge Instructions (Signed)
Continue taking home medications as prescribed.  Make sure you are drinking plenty of water.  It is important you follow up with the stomach doctors for further evaluation.  Return to the ER with any new, worsening, or concerning symptoms.

## 2018-01-25 NOTE — ED Notes (Signed)
Pt states unable to give urine sample at this time 

## 2018-01-25 NOTE — ED Notes (Signed)
ED Provider at bedside. 

## 2018-01-25 NOTE — ED Triage Notes (Signed)
Abdominal pain and diarrhea x 6 days.

## 2018-01-25 NOTE — ED Provider Notes (Signed)
[facility] EMERGENCY DEPARTMENT Provider Note   CSN: [id number] Arrival date & time: 01/25/18  1133     History   Chief Complaint Chief Complaint  Patient presents with  . Abdominal Pain    HPI Carolyn Zamora is a 62 y.o. female presenting for evaluation of abdominal pain.  Patient states the past 6 days, she has been having severe left-sided abdominal pain.  She also reports associated diarrhea, having about 6 stools a day.  Patient states stools are dark.  She is tried Pepto-Bismol per her doctor's recommendation without improvement of symptoms.  Patient states whenever she eats or drinks, she then has a bowel movement.  She reports chills without fevers.  Denies chest pain, shortness of breath, nausea, vomiting, or urinary symptoms.  Patient with significant medical history including renal cell for [facility] status post left nephrectomy, CKD, lupus on prednisone, chronic pain, hypertension.   Additional history obtained from chart review, patient seen last month for similar diarrhea and dark stools.  No colonoscopy performed at that time.  Patient does not know when or with whom her last colonoscopy was.   HPI  Past Medical History:  Diagnosis Date  . Abdominal wall hematoma 08/01/2017  . Anemia   . AVN (avascular necrosis of bone) ([place]) 08/01/2017  . CKD (chronic kidney disease), stage IV ([place])   . History of dental abscess with sepsis 2017 08/02/2017  . History of kidney cancer   . Hypertension   . Lupus ([place])   . 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 Ventral hernia without obstruction or gangrene 10/24/2015    Patient Active Problem List   Diagnosis Date Noted  . AKI (acute kidney injury) ([place]) 12/15/2017  . Diarrhea 12/15/2017  . Dehydration 12/15/2017  . Anemia 12/15/2017  . Renal failure (ARF), acute on chronic (HCC) 12/15/2017  . History of dental abscess with sepsis 2017 08/02/2017  . History of methicillin resistant  staphylococcus aureus (MRSA) 08/02/2017  . AVN (avascular necrosis of bone) ([place]) 08/01/2017  . Intractable vomiting 07/31/2017  . Sepsis ([place]) 07/23/2017  . Abdominal wall seroma - chronic from prior [facility] repair 2017 03/03/2016  . Immunocompromised state ([place]) 02/22/2016  . Low back pain 02/20/2016  . Pressure injury of skin 12/28/2015  . Acute renal failure (ARF) ([place]) 12/27/2015  . UTI (urinary tract infection) 12/27/2015  . Hyperkalemia 12/27/2015  . Hypertension   . Acute renal failure superimposed on stage 3 chronic kidney disease ([place]) 08/03/2015  . Systemic lupus erythematosus ([place]) 03/27/2015  . Acquired absence of spleen 06/21/2014  . 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 Chronic pain associated with significant psychosocial dysfunction 09/01/2013  . Chronic pain syndrome 09/01/2013  . Chronic kidney disease, stage III (moderate) ([place]) 08/17/2013    Past Surgical History:  Procedure Laterality Date  . INCISIONAL HERNIA REPAIR  11/19/2015   [facility].  Dr Phineas Douglas.  left VWH repair w mesh  . MULTIPLE TOOTH EXTRACTIONS    . NEPHRECTOMY    . ORIF ANKLE FRACTURE  11/16/2015   [facility], 2017  . SPLENECTOMY       OB History   No obstetric history on file.      Home Medications    Prior to Admission medications   Medication Sig Start Date End Date Taking? Authorizing Provider  amLODipine (NORVASC) 10 MG tablet Take 10 mg by mouth daily. 07/23/17   [provider]  DULoxetine (CYMBALTA) 30 MG capsule Take  60 mg by mouth daily.  10/28/17   [provider]  furosemide (LASIX) 40 MG tablet Take 40 mg by mouth daily. 07/02/17   [provider]  HYDROcodone-acetaminophen (NORCO/VICODIN) 5-325 MG tablet Take 1 tablet by mouth every 8 (eight) hours as needed for pain. 12/15/15   [provider]  hydroxychloroquine (PLAQUENIL) 200 MG tablet Take 200 mg by mouth daily. 12/15/15   [provider]  hydrOXYzine  (ATARAX/VISTARIL) 50 MG tablet Take 50 mg by mouth 3 (three) times daily as needed for nausea.  07/06/17   [provider]  lidocaine (LIDODERM) 5 % Place 1 patch onto the skin daily. Remove & Discard patch within 12 hours or as directed by MD 12/18/17   Georgette Shell, MD  mycophenolate (CELLCEPT) 500 MG tablet Take 500 mg by mouth daily. 03/18/17   [provider]  predniSONE (DELTASONE) 10 MG tablet Take 10 mg by mouth daily with breakfast.    [provider]  pregabalin (LYRICA) 150 MG capsule Take 1 capsule (150 mg total) by mouth daily. 12/18/17   Georgette Shell, MD  traZODone (DESYREL) 50 MG tablet Take 50 mg by mouth daily. 06/29/17   [provider]  Vitamin D, Ergocalciferol, (DRISDOL) 50000 units CAPS capsule Take 1 capsule by mouth once a week. 07/01/17   [provider]    Family History Family History  Problem Relation Age of Onset  . Hypertension Mother   . Diabetes Mellitus I Mother   . Lung cancer Father   . Hypertension Sister   . Colon cancer Brother     Social History Social History   Tobacco Use  . Smoking status: Never Smoker  . Smokeless tobacco: Never Used  Substance Use Topics  . Alcohol use: No  . Drug use: No     Allergies   Patient has no known allergies.   Review of Systems Review of Systems  Gastrointestinal: Positive for abdominal pain and diarrhea.  All other systems reviewed and are negative.    Physical Exam Updated Vital Signs BP (!) 148/90 (BP Location: Right Arm)   Pulse 88   Temp 97.8 F (36.6 C) (Oral)   Resp 18   Ht 5\' 2"  (1.575 m)   Wt 77.1 kg   SpO2 100%   BMI 31.09 kg/m   Physical Exam Vitals signs and nursing note reviewed.  Constitutional:      General: She is not in acute distress.    Appearance: She is well-developed.     Comments: Chronically ill-appearing female in no acute distress  HENT:     Head: Normocephalic and atraumatic.     Comments: MM moist     Mouth/Throat:     Mouth: Mucous membranes are moist.  Eyes:     Conjunctiva/sclera: Conjunctivae normal.     Pupils: Pupils are equal, round, and reactive to light.  Neck:     Musculoskeletal: Normal range of motion and neck supple.  Cardiovascular:     Rate and Rhythm: Normal rate and regular rhythm.  Pulmonary:     Effort: Pulmonary effort is normal. No respiratory distress.     Breath sounds: Normal breath sounds. No wheezing.  Abdominal:     General: Bowel sounds are normal. There is no distension.     Palpations: Abdomen is soft.     Tenderness: There is abdominal tenderness.     Comments: Generalized tenderness palpation of the abdomen.  Soft without rigidity, guarding, distention.  Negative rebound.  Musculoskeletal: Normal range of motion.  Skin:    General: Skin is warm and dry.     Capillary Refill: Capillary refill takes less than 2 seconds.  Neurological:     Mental Status: She is alert and oriented to person, place, and time.      ED Treatments / Results  Labs (all labs ordered are listed, but only abnormal results are displayed) Labs Reviewed  COMPREHENSIVE METABOLIC PANEL - Abnormal; Notable for the following components:      Result Value   CO2 18 (*)    Creatinine, Ser 1.99 (*)    Calcium 8.6 (*)    Total Protein 6.3 (*)    GFR calc non Af Amer 26 (*)    GFR calc Af Amer 31 (*)    All other components within normal limits  CBC - Abnormal; Notable for the following components:   Hemoglobin 10.1 (*)    HCT 34.3 (*)    MCV 77.3 (*)    MCH 22.7 (*)    MCHC 29.4 (*)    RDW 17.9 (*)    All other components within normal limits  URINALYSIS, ROUTINE W REFLEX MICROSCOPIC - Abnormal; Notable for the following components:   Specific Gravity, Urine >1.030 (*)    Protein, ur 30 (*)    All other components within normal limits  URINALYSIS, MICROSCOPIC (REFLEX) - Abnormal; Notable for the following components:   Bacteria, UA RARE (*)    All other components  within normal limits  LIPASE, BLOOD    EKG None  Radiology No results found.  Procedures Procedures (including critical care time)  Medications Ordered in ED Medications  morphine 4 MG/ML injection 4 mg (4 mg Intravenous Given 01/25/18 1734)  morphine 4 MG/ML injection 4 mg (4 mg Intravenous Given 01/25/18 1845)     Initial Impression / Assessment and Plan / ED Course  I have reviewed the triage vital signs and the nursing notes.  Pertinent labs & imaging results that were available during my care of the patient were reviewed by me and considered in my medical decision making (see chart for details).     Presenting for evaluation abdominal pain and diarrhea.  Physical exam reassuring, she is afebrile not tachycardic.  Appears nontoxic.  Abdominal exam with generalized tenderness, although patient is wincing and appears in pain with palpation, she denies pain with palpation of the right sided abdomen.  Labs reassuring,.  Patient's baseline.  Creatinine baseline at 1.9.  Hemoglobin stable at 10.1.  Urine without obvious infection.  Considering patient's history, will obtain CT for further evaluation.  CT reassuring, no signs of infection or surgical abdomen.  Case discussed with attending, Dr. Ronnald Nian evaluated the patient.  Symptoms are likely due to gastroenteritis, also consider slow GI bleed.  Ever, his hemoglobin is stable, and patient is not tachycardic/hypotensive, or pale, do not believe she needs admission or emergent EGD/colonoscopy.  However, I do recommend that she follow-up with GI.  Patient given information to follow-up with GI.  Bentyl as needed for spasm.  At this time, patient appears safe for discharge.  Pt is requesting more narcotic pain medication. I told pt that as she has pain medication at home, she will not receive additional pain medication. Return precautions given.  Patient states she understands the plan.   Final Clinical Impressions(s) / ED Diagnoses    Final diagnoses:  Left sided abdominal pain  Diarrhea, unspecified type    ED Discharge Orders    None  Franchot Heidelberg, PA-C 01/25/18 2116    Lennice Sites, DO 01/26/18 [id number]

## 2018-05-25 IMAGING — CT CT ABD-PELV W/O CM
2 of 4 series · 16 of 46 positions shown, 18 images · non-contrast
Comparison: 05/15/2015 CT

CLINICAL DATA: Painful left lateral ventral mass like area below
the left breast. Patient noticed mass 2 days ago.

EXAM:
CT ABDOMEN AND PELVIS WITHOUT CONTRAST
TECHNIQUE: Multidetector CT imaging of the abdomen and pelvis was performed
following the standard protocol without IV contrast.

[Series 2: axial st · axial · 0.97mm/px · z∈[-388,+37]mm · 13 of 93 slices shown, 15 images]
[im 4/93  soft-tissue]
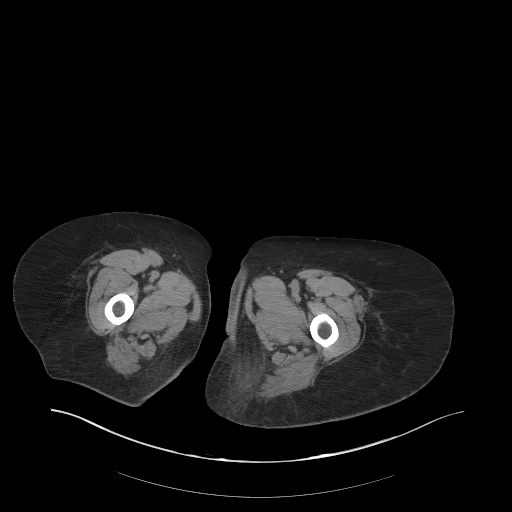
[im 4/93  bone]
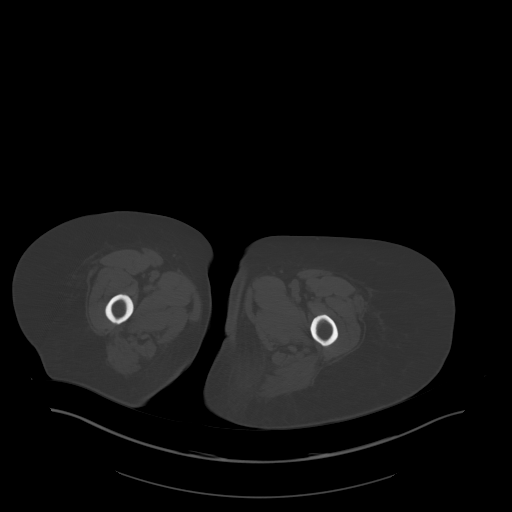
[im 11/93  soft-tissue]
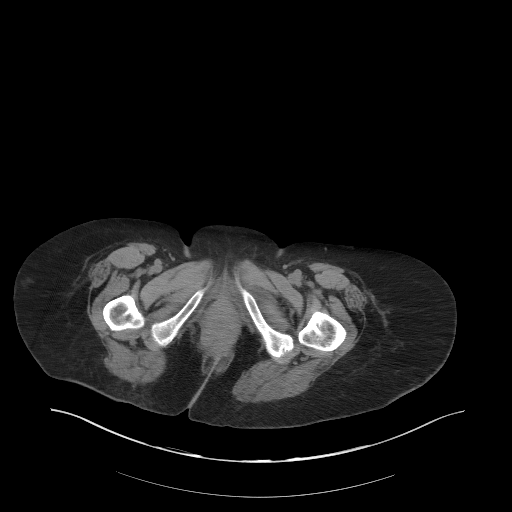
[im 18/93  soft-tissue]
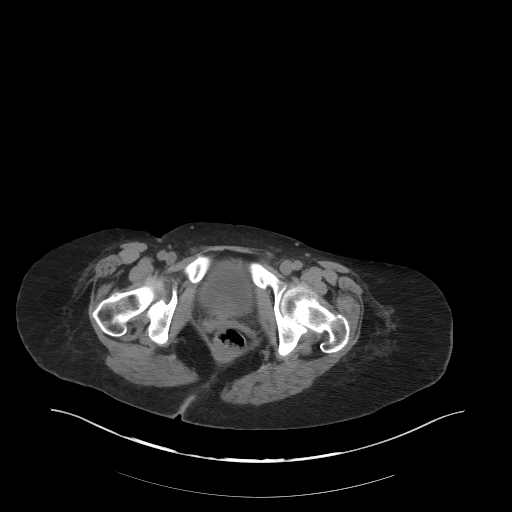
[im 25/93  soft-tissue]
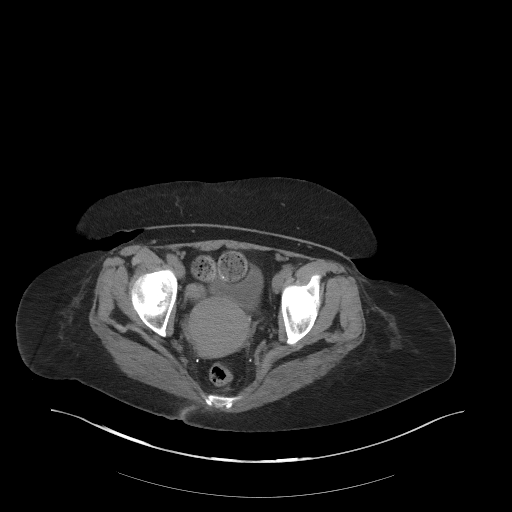
[im 32/93  soft-tissue]
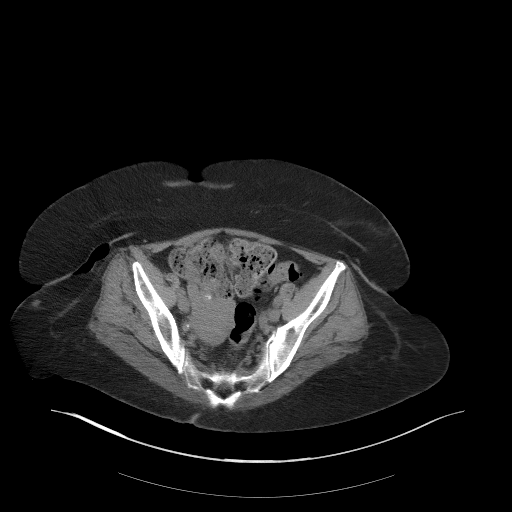
[im 39/93  soft-tissue]
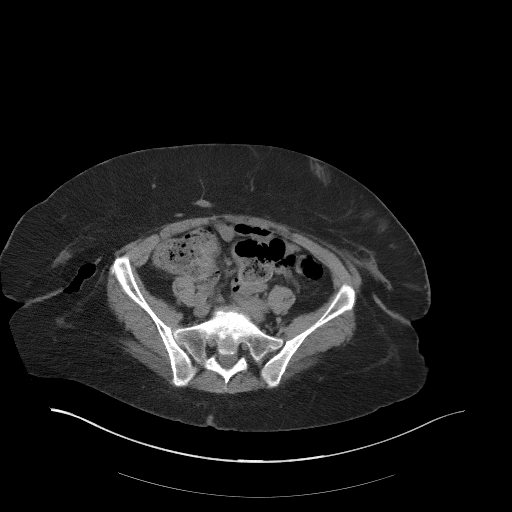
[im 47/93  soft-tissue]
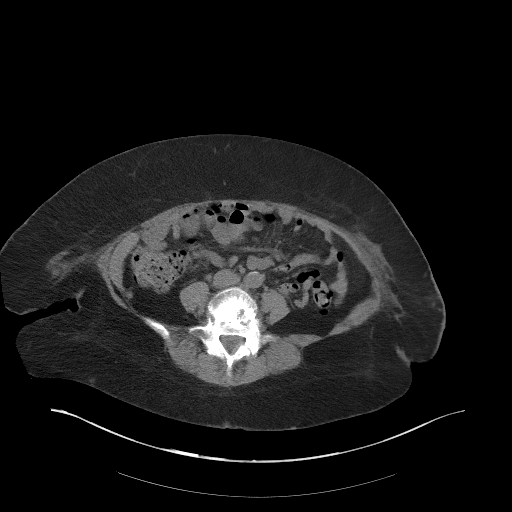
[im 54/93  soft-tissue]
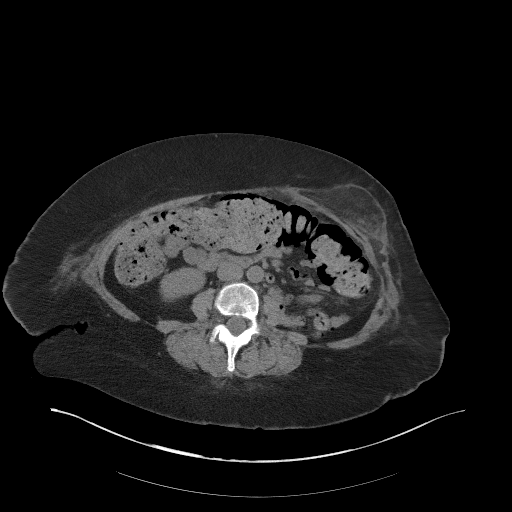
[im 61/93  soft-tissue]
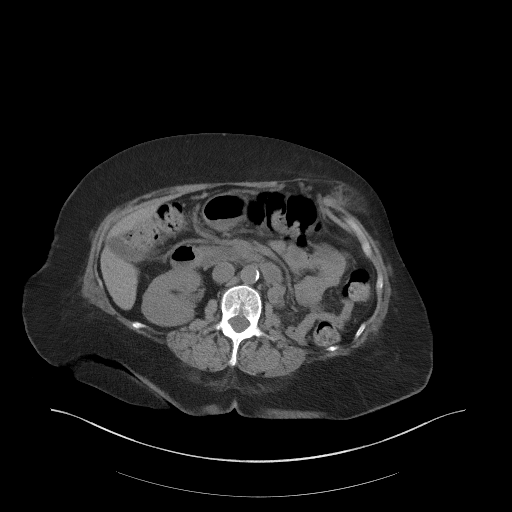
[im 61/93  bone]
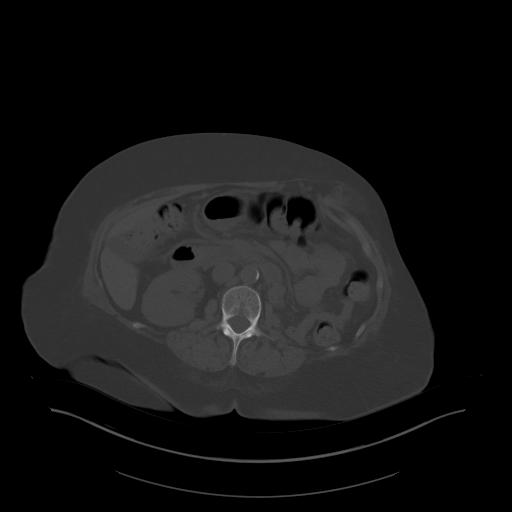
[im 68/93  soft-tissue]
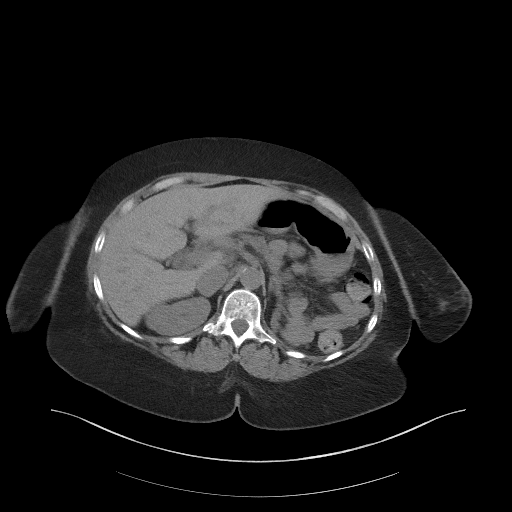
[im 75/93  soft-tissue]
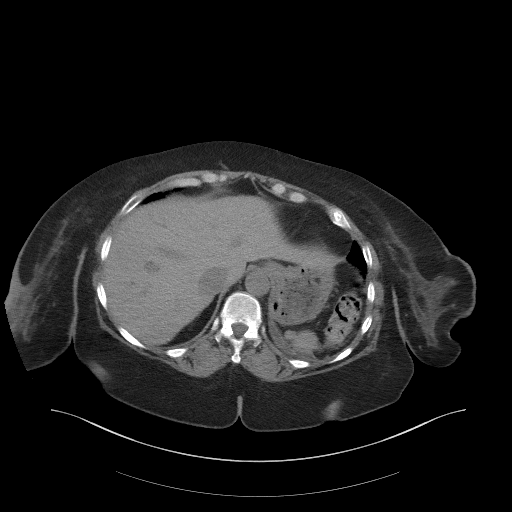
[im 82/93  soft-tissue]
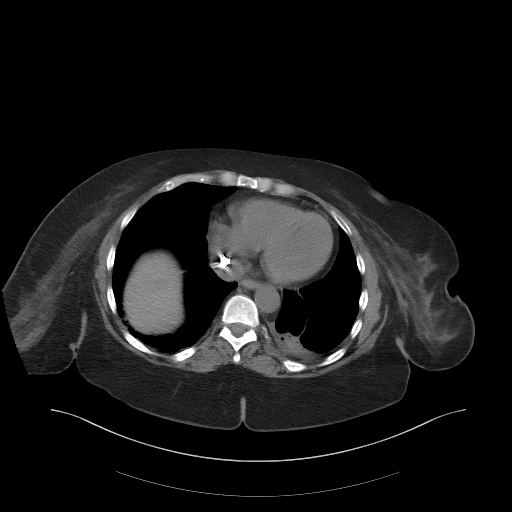
[im 89/93  soft-tissue]
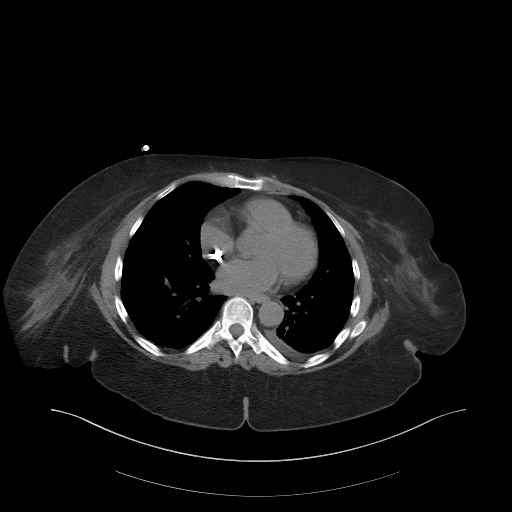

[Series 5: coronal st · coronal · 1.01mm/px · 3 of 88 slices shown]
[im 30/88  soft-tissue]
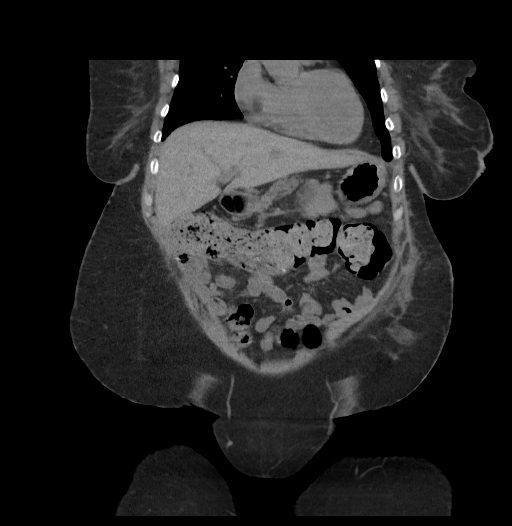
[im 39/88  soft-tissue]
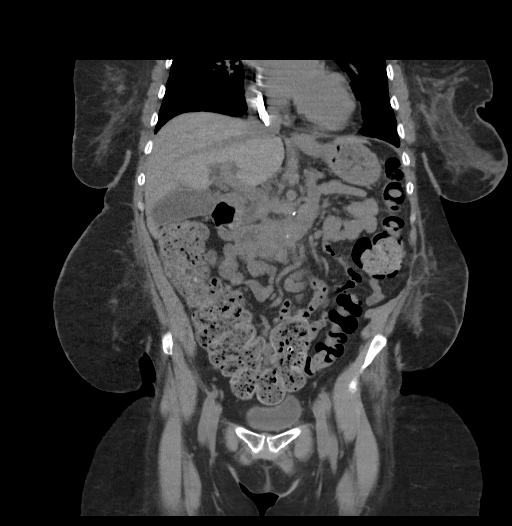
[im 49/88  soft-tissue]
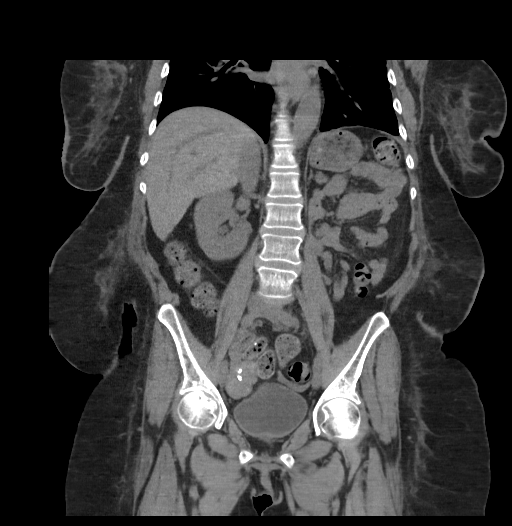

[16 of 46 positions shown; findings below may reference images not displayed]

FINDINGS: Lower chest: Small left pleural effusion is associated with
atelectasis at the lung bases. A right central line tip extends to
the right atrium.

Hepatobiliary: No focal liver lesions.  The gallbladder is present.

Pancreas: Normal in appearance.

Spleen: Multiple small splenules identified in the left upper
quadrant. Prior splenectomy.

Renal/Adrenal: The adrenal glands are normal in appearance. Left
kidney is absent. Right kidney is normal in appearance. No
hydronephrosis or ureteral obstruction.

Gastrointestinal tract: The stomach has a normal appearance. Small
bowel loops are normal in appearance. There is a significant amount
of stool throughout nondilated loops of colon.

Reproductive/Pelvis: The uterus is enlarged and contains calcified
fibroids. No adnexal mass.

Vascular/Lymphatic: There is calcification of the abdominal aorta.
No aneurysm. No retroperitoneal or mesenteric adenopathy.

Musculoskeletal/Abdominal wall: There is a left anterior abdominal
wall hernia which contains hazy fat and probably small amount of
fluid. The herniated fat %period% measures approximately 3.9 x 8.7 x
6.5 cm. Today the herniated sac does not contain bowel loops as on
prior studies. There is no associated bowel obstruction. There is 11
mm anterolisthesis L4 on L5. There is a right pars defect at L4.

Other: none
IMPRESSION: 1. Left anterior abdominal wall hernia containing strandy omental/
mesenteric fat and a small amount of fluid. Infarction of the
related fat should be considered given the symptoms of pain. There
is no bowel within the hernia on today's exam.
2. Status post splenectomy and left nephrectomy.
3. Small splenules in the left upper quadrant.
4.  Aortic atherosclerosis.
5. Anterolisthesis L4 on L5 with associated pars defect at L4.

## 2019-01-10 ENCOUNTER — Encounter (HOSPITAL_BASED_OUTPATIENT_CLINIC_OR_DEPARTMENT_OTHER): Payer: Self-pay | Admitting: *Deleted

## 2019-01-10 ENCOUNTER — Inpatient Hospital Stay (HOSPITAL_BASED_OUTPATIENT_CLINIC_OR_DEPARTMENT_OTHER)
Admission: EM | Admit: 2019-01-10 | Discharge: 2019-01-24 | DRG: 871 | Disposition: A | Discharge status: Home / Self Care | Payer: Medicare Other | Attending: Internal Medicine | Admitting: Internal Medicine

## 2019-01-10 ENCOUNTER — Emergency Department (HOSPITAL_BASED_OUTPATIENT_CLINIC_OR_DEPARTMENT_OTHER): Payer: Medicare Other

## 2019-01-10 ENCOUNTER — Other Ambulatory Visit: Payer: Self-pay

## 2019-01-10 DIAGNOSIS — N170 Acute kidney failure with tubular necrosis: Secondary | ICD-10-CM | POA: Diagnosis present

## 2019-01-10 DIAGNOSIS — Z905 Acquired absence of kidney: Secondary | ICD-10-CM

## 2019-01-10 DIAGNOSIS — C642 Malignant neoplasm of left kidney, except renal pelvis: Secondary | ICD-10-CM | POA: Diagnosis present

## 2019-01-10 DIAGNOSIS — E039 Hypothyroidism, unspecified: Secondary | ICD-10-CM | POA: Diagnosis present

## 2019-01-10 DIAGNOSIS — T380X5A Adverse effect of glucocorticoids and synthetic analogues, initial encounter: Secondary | ICD-10-CM | POA: Diagnosis present

## 2019-01-10 DIAGNOSIS — J90 Pleural effusion, not elsewhere classified: Secondary | ICD-10-CM | POA: Diagnosis present

## 2019-01-10 DIAGNOSIS — Z79890 Hormone replacement therapy (postmenopausal): Secondary | ICD-10-CM

## 2019-01-10 DIAGNOSIS — Z6835 Body mass index (BMI) 35.0-35.9, adult: Secondary | ICD-10-CM

## 2019-01-10 DIAGNOSIS — M79671 Pain in right foot: Secondary | ICD-10-CM | POA: Diagnosis present

## 2019-01-10 DIAGNOSIS — E669 Obesity, unspecified: Secondary | ICD-10-CM | POA: Diagnosis present

## 2019-01-10 DIAGNOSIS — N289 Disorder of kidney and ureter, unspecified: Secondary | ICD-10-CM

## 2019-01-10 DIAGNOSIS — N136 Pyonephrosis: Secondary | ICD-10-CM | POA: Diagnosis present

## 2019-01-10 DIAGNOSIS — G9341 Metabolic encephalopathy: Secondary | ICD-10-CM | POA: Diagnosis present

## 2019-01-10 DIAGNOSIS — N179 Acute kidney failure, unspecified: Secondary | ICD-10-CM

## 2019-01-10 DIAGNOSIS — Z85528 Personal history of other malignant neoplasm of kidney: Secondary | ICD-10-CM

## 2019-01-10 DIAGNOSIS — M7989 Other specified soft tissue disorders: Secondary | ICD-10-CM | POA: Diagnosis not present

## 2019-01-10 DIAGNOSIS — E871 Hypo-osmolality and hyponatremia: Secondary | ICD-10-CM | POA: Diagnosis not present

## 2019-01-10 DIAGNOSIS — N189 Chronic kidney disease, unspecified: Secondary | ICD-10-CM | POA: Diagnosis present

## 2019-01-10 DIAGNOSIS — R109 Unspecified abdominal pain: Secondary | ICD-10-CM

## 2019-01-10 DIAGNOSIS — E870 Hyperosmolality and hypernatremia: Secondary | ICD-10-CM | POA: Diagnosis not present

## 2019-01-10 DIAGNOSIS — M329 Systemic lupus erythematosus, unspecified: Secondary | ICD-10-CM | POA: Diagnosis present

## 2019-01-10 DIAGNOSIS — N19 Unspecified kidney failure: Secondary | ICD-10-CM

## 2019-01-10 DIAGNOSIS — Y848 Other medical procedures as the cause of abnormal reaction of the patient, or of later complication, without mention of misadventure at the time of the procedure: Secondary | ICD-10-CM | POA: Diagnosis not present

## 2019-01-10 DIAGNOSIS — D84821 Immunodeficiency due to drugs: Secondary | ICD-10-CM | POA: Diagnosis present

## 2019-01-10 DIAGNOSIS — D589 Hereditary hemolytic anemia, unspecified: Secondary | ICD-10-CM | POA: Diagnosis present

## 2019-01-10 DIAGNOSIS — Z79899 Other long term (current) drug therapy: Secondary | ICD-10-CM

## 2019-01-10 DIAGNOSIS — F419 Anxiety disorder, unspecified: Secondary | ICD-10-CM | POA: Diagnosis present

## 2019-01-10 DIAGNOSIS — Z20828 Contact with and (suspected) exposure to other viral communicable diseases: Secondary | ICD-10-CM | POA: Diagnosis present

## 2019-01-10 DIAGNOSIS — Z801 Family history of malignant neoplasm of trachea, bronchus and lung: Secondary | ICD-10-CM

## 2019-01-10 DIAGNOSIS — A419 Sepsis, unspecified organism: Principal | ICD-10-CM | POA: Diagnosis present

## 2019-01-10 DIAGNOSIS — N3001 Acute cystitis with hematuria: Secondary | ICD-10-CM

## 2019-01-10 DIAGNOSIS — Z833 Family history of diabetes mellitus: Secondary | ICD-10-CM

## 2019-01-10 DIAGNOSIS — E876 Hypokalemia: Secondary | ICD-10-CM | POA: Diagnosis not present

## 2019-01-10 DIAGNOSIS — Z79891 Long term (current) use of opiate analgesic: Secondary | ICD-10-CM

## 2019-01-10 DIAGNOSIS — R569 Unspecified convulsions: Secondary | ICD-10-CM | POA: Diagnosis not present

## 2019-01-10 DIAGNOSIS — D638 Anemia in other chronic diseases classified elsewhere: Secondary | ICD-10-CM | POA: Diagnosis present

## 2019-01-10 DIAGNOSIS — G894 Chronic pain syndrome: Secondary | ICD-10-CM | POA: Diagnosis present

## 2019-01-10 DIAGNOSIS — K219 Gastro-esophageal reflux disease without esophagitis: Secondary | ICD-10-CM | POA: Diagnosis present

## 2019-01-10 DIAGNOSIS — T8089XA Other complications following infusion, transfusion and therapeutic injection, initial encounter: Secondary | ICD-10-CM | POA: Diagnosis not present

## 2019-01-10 DIAGNOSIS — D509 Iron deficiency anemia, unspecified: Secondary | ICD-10-CM | POA: Diagnosis present

## 2019-01-10 DIAGNOSIS — Z8249 Family history of ischemic heart disease and other diseases of the circulatory system: Secondary | ICD-10-CM

## 2019-01-10 DIAGNOSIS — Z9081 Acquired absence of spleen: Secondary | ICD-10-CM

## 2019-01-10 DIAGNOSIS — I10 Essential (primary) hypertension: Secondary | ICD-10-CM | POA: Diagnosis present

## 2019-01-10 DIAGNOSIS — M1A9XX1 Chronic gout, unspecified, with tophus (tophi): Secondary | ICD-10-CM | POA: Diagnosis present

## 2019-01-10 DIAGNOSIS — Z7952 Long term (current) use of systemic steroids: Secondary | ICD-10-CM

## 2019-01-10 DIAGNOSIS — I129 Hypertensive chronic kidney disease with stage 1 through stage 4 chronic kidney disease, or unspecified chronic kidney disease: Secondary | ICD-10-CM | POA: Diagnosis present

## 2019-01-10 DIAGNOSIS — N184 Chronic kidney disease, stage 4 (severe): Secondary | ICD-10-CM | POA: Diagnosis present

## 2019-01-10 DIAGNOSIS — F329 Major depressive disorder, single episode, unspecified: Secondary | ICD-10-CM | POA: Diagnosis present

## 2019-01-10 DIAGNOSIS — E874 Mixed disorder of acid-base balance: Secondary | ICD-10-CM | POA: Diagnosis present

## 2019-01-10 DIAGNOSIS — D631 Anemia in chronic kidney disease: Secondary | ICD-10-CM | POA: Diagnosis present

## 2019-01-10 DIAGNOSIS — I700 Atherosclerosis of aorta: Secondary | ICD-10-CM | POA: Diagnosis present

## 2019-01-10 DIAGNOSIS — Z800 Family history of malignant neoplasm of digestive organs: Secondary | ICD-10-CM

## 2019-01-10 LAB — COMPREHENSIVE METABOLIC PANEL
ALT: 20 U/L (ref 0–44)
AST: 17 U/L (ref 15–41)
Albumin: 3 g/dL — ABNORMAL LOW (ref 3.5–5.0)
Alkaline Phosphatase: 91 U/L (ref 38–126)
Anion gap: 10 (ref 5–15)
BUN: 51 mg/dL — ABNORMAL HIGH (ref 8–23)
CO2: 18 mmol/L — ABNORMAL LOW (ref 22–32)
Calcium: 7.6 mg/dL — ABNORMAL LOW (ref 8.9–10.3)
Chloride: 113 mmol/L — ABNORMAL HIGH (ref 98–111)
Creatinine, Ser: 4.42 mg/dL — ABNORMAL HIGH (ref 0.44–1.00)
GFR calc Af Amer: 12 mL/min — ABNORMAL LOW (ref >60)
GFR calc non Af Amer: 10 mL/min — ABNORMAL LOW (ref >60)
Glucose, Bld: 75 mg/dL (ref 70–99)
Potassium: 4.1 mmol/L (ref 3.5–5.1)
Sodium: 141 mmol/L (ref 135–145)
Total Bilirubin: 0.8 mg/dL (ref 0.3–1.2)
Total Protein: 5.9 g/dL — ABNORMAL LOW (ref 6.5–8.1)

## 2019-01-10 LAB — CBC WITH DIFFERENTIAL/PLATELET
Abs Immature Granulocytes: 0.22 10*3/uL — ABNORMAL HIGH (ref 0.00–0.07)
Basophils Absolute: 0 10*3/uL (ref 0.0–0.1)
Basophils Relative: 0 %
Eosinophils Absolute: 0.1 10*3/uL (ref 0.0–0.5)
Eosinophils Relative: 0 %
HCT: 29.1 % — ABNORMAL LOW (ref 36.0–46.0)
Hemoglobin: 8.9 g/dL — ABNORMAL LOW (ref 12.0–15.0)
Immature Granulocytes: 1 %
Lymphocytes Relative: 10 %
Lymphs Abs: 2 10*3/uL (ref 0.7–4.0)
MCH: 22.4 pg — ABNORMAL LOW (ref 26.0–34.0)
MCHC: 30.6 g/dL (ref 30.0–36.0)
MCV: 73.1 fL — ABNORMAL LOW (ref 80.0–100.0)
Monocytes Absolute: 2.1 10*3/uL — ABNORMAL HIGH (ref 0.1–1.0)
Monocytes Relative: 10 %
Neutro Abs: 15.9 10*3/uL — ABNORMAL HIGH (ref 1.7–7.7)
Neutrophils Relative %: 79 %
Platelets: 199 10*3/uL (ref 150–400)
RBC: 3.98 MIL/uL (ref 3.87–5.11)
RDW: 15.3 % (ref 11.5–15.5)
WBC: 20.3 10*3/uL — ABNORMAL HIGH (ref 4.0–10.5)
nRBC: 0 % (ref 0.0–0.2)

## 2019-01-10 LAB — URINALYSIS, MICROSCOPIC (REFLEX)

## 2019-01-10 LAB — URINALYSIS, ROUTINE W REFLEX MICROSCOPIC
Bilirubin Urine: NEGATIVE
Glucose, UA: NEGATIVE mg/dL
Ketones, ur: NEGATIVE mg/dL
Nitrite: NEGATIVE
Protein, ur: 30 mg/dL — AB
Specific Gravity, Urine: 1.02 (ref 1.005–1.030)
pH: 6 (ref 5.0–8.0)

## 2019-01-10 LAB — LIPASE, BLOOD: Lipase: 16 U/L (ref 11–51)

## 2019-01-10 LAB — CK: Total CK: 42 U/L (ref 38–234)

## 2019-01-10 LAB — MAGNESIUM: Magnesium: 1.2 mg/dL — ABNORMAL LOW (ref 1.7–2.4)

## 2019-01-10 LAB — SARS CORONAVIRUS 2 AG (30 MIN TAT): SARS Coronavirus 2 Ag: NEGATIVE

## 2019-01-10 MED ORDER — HYDROMORPHONE HCL 1 MG/ML IJ SOLN
0.5000 mg | Freq: Once | INTRAMUSCULAR | Status: AC
  Administered 2019-01-10: 22:00:00 0.5 mg via INTRAVENOUS
  Filled 2019-01-10: qty 1

## 2019-01-10 MED ORDER — SODIUM CHLORIDE 0.9 % IV BOLUS
500.0000 mL | Freq: Once | INTRAVENOUS | Status: AC
  Administered 2019-01-10: 500 mL via INTRAVENOUS

## 2019-01-10 MED ORDER — SODIUM CHLORIDE 0.9 % IV SOLN
1.0000 g | Freq: Once | INTRAVENOUS | Status: AC
  Administered 2019-01-10: 20:00:00 1 g via INTRAVENOUS
  Filled 2019-01-10: qty 10

## 2019-01-10 MED ORDER — HYDROMORPHONE HCL 1 MG/ML IJ SOLN
1.0000 mg | Freq: Once | INTRAMUSCULAR | Status: AC
  Administered 2019-01-10: 17:00:00 1 mg via INTRAVENOUS
  Filled 2019-01-10: qty 1

## 2019-01-10 MED ORDER — SODIUM CHLORIDE 0.9 % IV BOLUS: 500.0000 mL | Freq: Once | INTRAVENOUS | Status: DC

## 2019-01-10 MED ORDER — HYDROMORPHONE HCL 1 MG/ML IJ SOLN
1.0000 mg | Freq: Once | INTRAMUSCULAR | Status: AC
  Administered 2019-01-10: 1 mg via INTRAVENOUS
  Filled 2019-01-10: qty 1

## 2019-01-10 MED ORDER — MAGNESIUM SULFATE 50 % IJ SOLN: 1.0000 g | Freq: Once | INTRAMUSCULAR | Status: DC

## 2019-01-10 MED ORDER — SODIUM CHLORIDE 0.9 % IV SOLN
Freq: Once | INTRAVENOUS | Status: AC
  Administered 2019-01-10: 20:00:00 via INTRAVENOUS

## 2019-01-10 MED ORDER — MAGNESIUM SULFATE IN D5W 1-5 GM/100ML-% IV SOLN
1.0000 g | Freq: Once | INTRAVENOUS | Status: AC
  Administered 2019-01-10: 18:00:00 1 g via INTRAVENOUS
  Filled 2019-01-10: qty 100

## 2019-01-10 NOTE — ED Notes (Signed)
Pt. Reports she is immune compromised and has history of lupus with only one kidney due to kidney cancer.  Pt. Reports she is unable to urinate at present time.

## 2019-01-10 NOTE — ED Provider Notes (Signed)
Carolyn [facility] Provider Note   CSN: [id number] Arrival date & time: 01/10/19  1135     History   Chief Complaint Chief Complaint  Patient presents with   Foot Pain    HPI Carolyn Zamora is a 63 y.o. female with a history of SLE, hypertension, CKD stage IV, AVN, & chronic pain syndrome who presents to the ED with primary complaints of right foot pain for the past few days as well as generalized body aches for the past 5 days.  Patient states the pain is to the generalized right foot, more so in the midfoot, it is constant, worse with movement and palpation, no alleviating factors.  She states that she did break this foot a few years ago but does not recall any recent injuries.  She also mentions on further questioning that she is feeling achy all over, this is constant and associated with chills, some mild abdominal discomfort, as well as 2 episodes of emesis.  No specific triggers or change in activity that she can recall.  She does not know of any recent Covid exposures.  States this does not necessarily feel similar to prior lupus flares.  She denies any fevers, URI symptoms, cough, dyspnea, chest pain, diarrhea, melena, hematochezia, numbness, weakness, leg swelling, hemoptysis, recent surgery/trauma, recent long travel, hormone use, personal hx of cancer, or hx of DVT/PE.         HPI  Past Medical History:  Diagnosis Date   Abdominal wall hematoma 08/01/2017   Anemia    AVN (avascular necrosis of bone) ([place]) 08/01/2017   CKD (chronic kidney disease), stage IV (HCC)    History of dental abscess with sepsis 2017 08/02/2017   History of kidney cancer    Hypertension    Lupus ([place])    Renal cell carcinoma of left kidney s/p nephrectomy 06/21/2014   Ventral hernia without obstruction or gangrene 10/24/2015    Patient Active Problem List   Diagnosis Date Noted   AKI (acute kidney injury) ([place]) 12/15/2017   Diarrhea 12/15/2017    Dehydration 12/15/2017   Anemia 12/15/2017   Renal failure (ARF), acute on chronic ([place]) 12/15/2017   History of dental abscess with sepsis 2017 08/02/2017   History of methicillin resistant staphylococcus aureus (MRSA) 08/02/2017   AVN (avascular necrosis of bone) ([place]) 08/01/2017   Intractable vomiting 07/31/2017   Sepsis ([place]) 07/23/2017   Abdominal wall seroma - chronic from prior [facility] repair 2017 03/03/2016   Immunocompromised state ([place]) 02/22/2016   Low back pain 02/20/2016   Pressure injury of skin 12/28/2015   Acute renal failure (ARF) ([place]) 12/27/2015   UTI (urinary tract infection) 12/27/2015   Hyperkalemia 12/27/2015   Hypertension    Acute renal failure superimposed on stage 3 chronic kidney disease ([place]) 08/03/2015   Systemic lupus erythematosus ([place]) 03/27/2015   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  Dr Phineas Douglas.  left [place] repair w mesh   MULTIPLE TOOTH EXTRACTIONS     NEPHRECTOMY     ORIF ANKLE FRACTURE  11/16/2015   Max Cohen, 2017   SPLENECTOMY       OB History   No obstetric history on file.  Home Medications    Prior to Admission medications   Medication Sig Start Date End Date Taking? Authorizing Provider  amLODipine (NORVASC) 10 MG tablet Take 10 mg by mouth daily. 07/23/17  Yes [provider]  dicyclomine (BENTYL) 20 MG tablet Take 1 tablet (20 mg total) by mouth 2 (two) times daily as needed for spasms. 01/25/18  Yes Caccavale, Sophia, PA-C  DULoxetine (CYMBALTA) 30 MG capsule Take 60 mg by mouth daily.  10/28/17  Yes [provider]  furosemide (LASIX) 40 MG tablet  Take 40 mg by mouth daily. 07/02/17  Yes [provider]  HYDROcodone-acetaminophen (NORCO/VICODIN) 5-325 MG tablet Take 1 tablet by mouth every 8 (eight) hours as needed for pain. 12/15/15  Yes [provider]  hydroxychloroquine (PLAQUENIL) 200 MG tablet Take 200 mg by mouth daily. 12/15/15  Yes [provider]  hydrOXYzine (ATARAX/VISTARIL) 50 MG tablet Take 50 mg by mouth 3 (three) times daily as needed for nausea.  07/06/17  Yes [provider]  lidocaine (LIDODERM) 5 % Place 1 patch onto the skin daily. Remove & Discard patch within 12 hours or as directed by MD 12/18/17  Yes Georgette Shell, MD  mycophenolate (CELLCEPT) 500 MG tablet Take 500 mg by mouth daily. 03/18/17  Yes [provider]  predniSONE (DELTASONE) 10 MG tablet Take 10 mg by mouth daily with breakfast.   Yes [provider]  pregabalin (LYRICA) 150 MG capsule Take 1 capsule (150 mg total) by mouth daily. 12/18/17  Yes Georgette Shell, MD  traZODone (DESYREL) 50 MG tablet Take 50 mg by mouth daily. 06/29/17  Yes [provider]  Vitamin D, Ergocalciferol, (DRISDOL) 50000 units CAPS capsule Take 1 capsule by mouth once a week. 07/01/17  Yes [provider]    Family History Family History  Problem Relation Age of Onset   Hypertension Mother    Diabetes Mellitus I Mother    Lung cancer Father    Hypertension Sister    Colon cancer Brother     Social History Social History   Tobacco Use   Smoking status: Never Smoker   Smokeless tobacco: Never Used  Substance Use Topics   Alcohol use: No   Drug use: No     Allergies   Patient has no known allergies.   Review of Systems Review of Systems  Constitutional: Positive for chills. Negative for fever.  Respiratory: Negative for cough and shortness of breath.   Cardiovascular: Negative for chest pain and leg swelling.  Gastrointestinal: Positive for abdominal pain, nausea and  vomiting. Negative for blood in stool, constipation and diarrhea.  Genitourinary: Negative for dysuria.  Musculoskeletal: Positive for arthralgias and myalgias.  Skin: Negative for color change, rash and wound.  Neurological: Negative for weakness and numbness.  All other systems reviewed and are negative.    Physical Exam Updated Vital Signs BP (!) 146/126 (BP Location: Right Wrist) Comment (BP Location): PER PT REQUEST   Pulse (!) 115    Temp 98.3 F (36.8 C) (Oral)    Resp 19    Ht 5\' 2"  (1.575 m)    Wt 75.8 kg    SpO2 100%    BMI 30.54 kg/m   Physical Exam Vitals signs and nursing note reviewed.  Constitutional:      General: She is not in acute distress.    Appearance: She is well-developed.  HENT:     Head: Normocephalic and atraumatic.     Right Ear: Ear canal normal.  Tympanic membrane is not perforated, erythematous, retracted or bulging.     Left Ear: Ear canal normal. Tympanic membrane is not perforated, erythematous, retracted or bulging.     Ears:     Comments: No mastoid erythema/swelling/tenderness.     Nose:     Right Sinus: No maxillary sinus tenderness or frontal sinus tenderness.     Left Sinus: No maxillary sinus tenderness or frontal sinus tenderness.     Mouth/Throat:     Pharynx: Uvula midline. No oropharyngeal exudate or posterior oropharyngeal erythema.     Comments: Posterior oropharynx is symmetric appearing. Patient tolerating own secretions without difficulty. No trismus. No drooling. No hot potato voice. No swelling beneath the tongue, submandibular compartment is soft.  Eyes:     General:        Right eye: No discharge.        Left eye: No discharge.     Conjunctiva/sclera: Conjunctivae normal.     Pupils: Pupils are equal, round, and reactive to light.  Neck:     Musculoskeletal: Normal range of motion and neck supple. No edema or neck rigidity.  Cardiovascular:     Rate and Rhythm: Normal rate and regular rhythm.     Heart sounds: No murmur.       Comments: 2+ symmetric DP and radial pulses. Pulmonary:     Effort: Pulmonary effort is normal. No respiratory distress.     Breath sounds: Normal breath sounds. No wheezing, rhonchi or rales.  Abdominal:     General: There is no distension.     Palpations: Abdomen is soft.     Tenderness: There is no abdominal tenderness. There is no guarding or rebound.  Musculoskeletal:     Right lower leg: No edema.     Left lower leg: No edema.     Comments: UEs/LEs: No significant erythema, warmth, or open wounds.  No significant swelling.  No edema.  Patient has intact activation range of motion throughout bilateral upper and lower extremities.  She is diffusely tender throughout her upper and lower extremities.  Most focal tenderness appears to be to the mid and forefoot of the right foot.  There is no point/focal bony tenderness otherwise.  Compartments are soft.  Neurovascularly intact distally x4. Diffuse lower back tenderness to palpation without point/focal midline tenderness.   Lymphadenopathy:     Cervical: No cervical adenopathy.  Skin:    General: Skin is warm and dry.     Findings: Rash (discoid rash) present.  Neurological:     Mental Status: She is alert.     Comments: Sensation gross intact right upper and lower extremities.  5 out of 5 grip strength.  5 out of 5 strength with plantar dorsiflexion bilaterally.  Psychiatric:        Behavior: Behavior normal.    ED Treatments / Results  Labs (all labs ordered are listed, but only abnormal results are displayed) Labs Reviewed  CBC WITH DIFFERENTIAL/PLATELET - Abnormal; Notable for the following components:      Result Value   WBC 20.3 (*)    Hemoglobin 8.9 (*)    HCT 29.1 (*)    MCV 73.1 (*)    MCH 22.4 (*)    Neutro Abs 15.9 (*)    Monocytes Absolute 2.1 (*)    Abs Immature Granulocytes 0.22 (*)    All other components within normal limits  COMPREHENSIVE METABOLIC PANEL - Abnormal; Notable for the following components:    Chloride 113 (*)  CO2 18 (*)    BUN 51 (*)    Creatinine, Ser 4.42 (*)    Calcium 7.6 (*)    Total Protein 5.9 (*)    Albumin 3.0 (*)    GFR calc non Af Amer 10 (*)    GFR calc Af Amer 12 (*)    All other components within normal limits  MAGNESIUM - Abnormal; Notable for the following components:   Magnesium 1.2 (*)    All other components within normal limits  SARS CORONAVIRUS 2 AG (30 MIN TAT)  LIPASE, BLOOD  CK  URINALYSIS, ROUTINE W REFLEX MICROSCOPIC    EKG None  Radiology Dg Chest Port 1 View  Result Date: 01/10/2019 CLINICAL DATA:  Body aches and fevers. EXAM: PORTABLE CHEST 1 VIEW COMPARISON:  July 31, 2017 FINDINGS: The heart size is enlarged. There is no pneumothorax. No large pleural effusion. There is likely atelectasis at the lung bases. Aortic calcifications are noted. IMPRESSION: 1. Cardiomegaly without edema or pleural effusion. 2. Bibasilar atelectasis. 3. Aortic atherosclerosis. Electronically Signed   By: Constance Holster M.D.   On: 01/10/2019 16:52   Dg Foot Complete Right  Result Date: 01/10/2019 CLINICAL DATA:  Foot pain pain EXAM: RIGHT FOOT COMPLETE - 3+ VIEW COMPARISON:  11/16/2015. FINDINGS: There are advanced degenerative changes of the first metatarsophalangeal joint. There is osteopenia. There is no acute displaced fracture. No dislocation. The patient is status post prior ORIF of the ankle. Vascular calcifications are noted. IMPRESSION: No acute osseous abnormality. Electronically Signed   By: Constance Holster M.D.   On: 01/10/2019 15:09    Procedures Procedures (including critical care time)  Medications Ordered in ED Medications  cefTRIAXone (ROCEPHIN) 1 g in sodium chloride 0.9 % 100 mL IVPB (has no administration in time range)  0.9 %  sodium chloride infusion (has no administration in time range)  HYDROmorphone (DILAUDID) injection 1 mg (1 mg Intravenous Given 01/10/19 1541)  sodium chloride 0.9 % bolus 500 mL (0 mLs Intravenous  Stopped 01/10/19 1810)  HYDROmorphone (DILAUDID) injection 1 mg (1 mg Intravenous Given 01/10/19 1659)  magnesium sulfate IVPB 1 g 100 mL (0 g Intravenous Stopped 01/10/19 1924)     Initial Impression / Assessment and Plan / ED Course  I have reviewed the triage vital signs and the nursing notes.  Pertinent labs & imaging results that were available during my care of the patient were reviewed by me and considered in my medical decision making (see chart for details).   Patient presents to the ED with complaints of R foot pain for the past few days as well as generalized body aches for the past 5 days. Patient is nontoxic appearing, her initial tachycardia normalized on my exam & with repeat vitals. Lungs CTA. Abdomen without peritoneal signs. She has diffuse hyperalgesia with tenderness to palpation throughout the extremities with most focal in the R mid/forefoot. Compartments are soft. No overlying signs of infection to the extremities. Moving all joints. Plan for labs, R foot xray, & analgesics.   CBC: Leukocytosis at 20.3 with left shift, patient is on chronic steroids however this is elevated compared to her prior labs.  She is anemic, slightly worse than previously. CMP: Patient has significantly elevated BUN and creatinine at 51 and 4.42, per her nephrology note 11/23/18- latest available laboratory studies obtained on 10/07/2018 had shown the serum creatinine level to be 2.52 mg/dl with BUN of 40 mg/dl and estimated GFR of 23 ml/min.She did have an episode of superimposed  Acute Kidney Injury in the setting of acute pyelonephritis / UTI requiring hospitalization between 03/19 and 05/01/2018. The serum creatinine ranged between 3.01 and 3.36 mg/dl. Also noted to have acute gout at that time. Significant elevation today compared to prior consistent w/ AKI.  She is also noted to be hypocalcemic, hypochloremic, and have a low bicarb at 18.  LFTs WNL. Magnesium mildly low at 1.2--- Iv mag  ordered Lipase: WNL. CK: WNL. UA: Concerning for infection- start rocephin.  Rapid Covid test is negative. Chest x-ray:1. Cardiomegaly without edema or pleural effusion. 2. Bibasilar atelectasis. 3. Aortic atherosclerosis Foot x-ray is negative.  Patient here with several complaints.  Her right foot pain does not seem infectious, x-ray without fracture or dislocation, no edema- does not seem consistent with a DVT-it is somewhat in the L5 distribution of the foot, no point/focal midline tenderness to the L-spine or recent traumatic injury.  She is having some improvement status post Dilaudid.  Her labs are significant for a leukocytosis, possibly from UTI- started rocephin, as well as an acute kidney injury- received 500 cc bolus, start maintenance 123ml/hr.  Given her AKI will consult hospitalist service for admission.  This is a shared visit with supervising physician Dr. Langston Masker who has independently evaluated patient & provided guidance in evaluation/management/disposition, in agreement with care    Clinical Course as of Jan 10 1627  [facility] Jan 10, 2019  1601 Patient was seen by myself as well as PA provider.  Briefly 63 year old female with a complicated history including renal failure, lupus, not on dialysis, presenting to the emergency [facility] with multiple complaints.  Most focally the patient is complaining of pain in her right foot which began earlier today.  She denies any known trauma to the area.  She describes hyperalgesia and sensitivity and pain on the medial aspect of her right leg extending down to the toe and also wrapping around to the underside of her foot.  She says it is severe.  She says she has difficulty walking.  She also has multiple complaints complains of myalgias, back pain, abdominal pain.  She reports that her doctor called her a week ago and told her that her creatinine level was 4 that she would need evaluation for that, which is not obtained yet.  She is still  urinating normally daily.  She appears comfortable on exam.  She has no neurological deficit in terms of her strength testing but does report paresthesias in the L5 distribution of the right foot.  She has diffuse lower back pain with no focal pinpoint tenderness.  As for her labs reveal leukocytosis of 20.3.  She has a left shift.  Not clear what the cause of this is, as she is afebrile.  We will obtain an infectious work-up as well as rapid Covid testing.  Anticipate she will need to be admitted.   [MT]    Clinical Course User Index [MT] Trifan, Carola Rhine, MD    19:09: CONSULT: Discussed with hospitalist Dr. Posey Pronto- accepts admission.   Cortina Vultaggio was evaluated in Emergency [facility] on 01/10/2019 for the symptoms described in the history of present illness. He/she was evaluated in the context of the global COVID-19 pandemic, which necessitated consideration that the patient might be at risk for infection with the SARS-CoV-2 virus that causes COVID-19. Institutional protocols and algorithms that pertain to the evaluation of patients at risk for COVID-19 are in a state of rapid change based on information released by regulatory bodies including the CDC  and federal and state organizations. These policies and algorithms were followed during the patient's care in the ED.    Final Clinical Impressions(s) / ED Diagnoses   Final diagnoses:  AKI (acute kidney injury) ([place])  Acute cystitis with hematuria  Right foot pain    ED Discharge Orders    None       Amaryllis Dyke, PA-C 01/10/19 1925    Wyvonnia Dusky, MD 01/11/19 [phone]

## 2019-01-10 NOTE — ED Triage Notes (Addendum)
Woke with pain in her right foot. Hx of lupus. Body aches. After asking pt Covid screening questions she has many Covid symptoms.

## 2019-01-11 ENCOUNTER — Inpatient Hospital Stay (HOSPITAL_COMMUNITY): Payer: Medicare Other

## 2019-01-11 ENCOUNTER — Encounter (HOSPITAL_COMMUNITY): Payer: Self-pay | Admitting: General Practice

## 2019-01-11 DIAGNOSIS — D589 Hereditary hemolytic anemia, unspecified: Secondary | ICD-10-CM | POA: Diagnosis present

## 2019-01-11 DIAGNOSIS — N184 Chronic kidney disease, stage 4 (severe): Secondary | ICD-10-CM | POA: Diagnosis present

## 2019-01-11 DIAGNOSIS — N1832 Chronic kidney disease, stage 3b: Secondary | ICD-10-CM

## 2019-01-11 DIAGNOSIS — D638 Anemia in other chronic diseases classified elsewhere: Secondary | ICD-10-CM

## 2019-01-11 DIAGNOSIS — N179 Acute kidney failure, unspecified: Secondary | ICD-10-CM

## 2019-01-11 DIAGNOSIS — E870 Hyperosmolality and hypernatremia: Secondary | ICD-10-CM | POA: Diagnosis not present

## 2019-01-11 DIAGNOSIS — M79672 Pain in left foot: Secondary | ICD-10-CM

## 2019-01-11 DIAGNOSIS — D84821 Immunodeficiency due to drugs: Secondary | ICD-10-CM | POA: Diagnosis present

## 2019-01-11 DIAGNOSIS — G9341 Metabolic encephalopathy: Secondary | ICD-10-CM | POA: Diagnosis present

## 2019-01-11 DIAGNOSIS — Z905 Acquired absence of kidney: Secondary | ICD-10-CM | POA: Diagnosis not present

## 2019-01-11 DIAGNOSIS — G894 Chronic pain syndrome: Secondary | ICD-10-CM

## 2019-01-11 DIAGNOSIS — R609 Edema, unspecified: Secondary | ICD-10-CM | POA: Diagnosis not present

## 2019-01-11 DIAGNOSIS — D631 Anemia in chronic kidney disease: Secondary | ICD-10-CM | POA: Diagnosis present

## 2019-01-11 DIAGNOSIS — I10 Essential (primary) hypertension: Secondary | ICD-10-CM | POA: Diagnosis not present

## 2019-01-11 DIAGNOSIS — E874 Mixed disorder of acid-base balance: Secondary | ICD-10-CM | POA: Diagnosis present

## 2019-01-11 DIAGNOSIS — E871 Hypo-osmolality and hyponatremia: Secondary | ICD-10-CM | POA: Diagnosis not present

## 2019-01-11 DIAGNOSIS — N170 Acute kidney failure with tubular necrosis: Secondary | ICD-10-CM | POA: Diagnosis present

## 2019-01-11 DIAGNOSIS — R4182 Altered mental status, unspecified: Secondary | ICD-10-CM | POA: Diagnosis not present

## 2019-01-11 DIAGNOSIS — N136 Pyonephrosis: Secondary | ICD-10-CM | POA: Diagnosis present

## 2019-01-11 DIAGNOSIS — J90 Pleural effusion, not elsewhere classified: Secondary | ICD-10-CM | POA: Diagnosis present

## 2019-01-11 DIAGNOSIS — C642 Malignant neoplasm of left kidney, except renal pelvis: Secondary | ICD-10-CM

## 2019-01-11 DIAGNOSIS — M79671 Pain in right foot: Secondary | ICD-10-CM | POA: Diagnosis present

## 2019-01-11 DIAGNOSIS — A419 Sepsis, unspecified organism: Secondary | ICD-10-CM | POA: Diagnosis present

## 2019-01-11 DIAGNOSIS — M1A9XX1 Chronic gout, unspecified, with tophus (tophi): Secondary | ICD-10-CM | POA: Diagnosis present

## 2019-01-11 DIAGNOSIS — M329 Systemic lupus erythematosus, unspecified: Secondary | ICD-10-CM | POA: Diagnosis present

## 2019-01-11 DIAGNOSIS — Y848 Other medical procedures as the cause of abnormal reaction of the patient, or of later complication, without mention of misadventure at the time of the procedure: Secondary | ICD-10-CM | POA: Diagnosis not present

## 2019-01-11 DIAGNOSIS — F329 Major depressive disorder, single episode, unspecified: Secondary | ICD-10-CM | POA: Diagnosis present

## 2019-01-11 DIAGNOSIS — R569 Unspecified convulsions: Secondary | ICD-10-CM | POA: Diagnosis not present

## 2019-01-11 DIAGNOSIS — Z20828 Contact with and (suspected) exposure to other viral communicable diseases: Secondary | ICD-10-CM | POA: Diagnosis present

## 2019-01-11 DIAGNOSIS — R6520 Severe sepsis without septic shock: Secondary | ICD-10-CM

## 2019-01-11 DIAGNOSIS — M7989 Other specified soft tissue disorders: Secondary | ICD-10-CM | POA: Diagnosis not present

## 2019-01-11 DIAGNOSIS — D509 Iron deficiency anemia, unspecified: Secondary | ICD-10-CM | POA: Diagnosis present

## 2019-01-11 DIAGNOSIS — E039 Hypothyroidism, unspecified: Secondary | ICD-10-CM | POA: Diagnosis present

## 2019-01-11 DIAGNOSIS — M3214 Glomerular disease in systemic lupus erythematosus: Secondary | ICD-10-CM

## 2019-01-11 DIAGNOSIS — F419 Anxiety disorder, unspecified: Secondary | ICD-10-CM | POA: Diagnosis present

## 2019-01-11 LAB — CBC WITH DIFFERENTIAL/PLATELET
Abs Immature Granulocytes: 0 10*3/uL (ref 0.00–0.07)
Basophils Absolute: 0 10*3/uL (ref 0.0–0.1)
Basophils Relative: 0 %
Eosinophils Absolute: 0 10*3/uL (ref 0.0–0.5)
Eosinophils Relative: 0 %
HCT: 28 % — ABNORMAL LOW (ref 36.0–46.0)
Hemoglobin: 8.7 g/dL — ABNORMAL LOW (ref 12.0–15.0)
Lymphocytes Relative: 4 %
Lymphs Abs: 1.1 10*3/uL (ref 0.7–4.0)
MCH: 22.8 pg — ABNORMAL LOW (ref 26.0–34.0)
MCHC: 31.1 g/dL (ref 30.0–36.0)
MCV: 73.5 fL — ABNORMAL LOW (ref 80.0–100.0)
Monocytes Absolute: 0.3 10*3/uL (ref 0.1–1.0)
Monocytes Relative: 1 %
Neutro Abs: 27.1 10*3/uL — ABNORMAL HIGH (ref 1.7–7.7)
Neutrophils Relative %: 95 %
Platelets: 206 10*3/uL (ref 150–400)
RBC: 3.81 MIL/uL — ABNORMAL LOW (ref 3.87–5.11)
RDW: 15.5 % (ref 11.5–15.5)
WBC: 28.5 10*3/uL — ABNORMAL HIGH (ref 4.0–10.5)
nRBC: 0 % (ref 0.0–0.2)
nRBC: 0 /100 WBC

## 2019-01-11 LAB — LACTIC ACID, PLASMA
Lactic Acid, Venous: 0.9 mmol/L (ref 0.5–1.9)
Lactic Acid, Venous: 1.1 mmol/L (ref 0.5–1.9)

## 2019-01-11 LAB — PROCALCITONIN: Procalcitonin: 3.53 ng/mL

## 2019-01-11 LAB — COMPREHENSIVE METABOLIC PANEL
ALT: 17 U/L (ref 0–44)
AST: 13 U/L — ABNORMAL LOW (ref 15–41)
Albumin: 2.4 g/dL — ABNORMAL LOW (ref 3.5–5.0)
Alkaline Phosphatase: 99 U/L (ref 38–126)
Anion gap: 13 (ref 5–15)
BUN: 45 mg/dL — ABNORMAL HIGH (ref 8–23)
CO2: 14 mmol/L — ABNORMAL LOW (ref 22–32)
Calcium: 7.7 mg/dL — ABNORMAL LOW (ref 8.9–10.3)
Chloride: 113 mmol/L — ABNORMAL HIGH (ref 98–111)
Creatinine, Ser: 4.09 mg/dL — ABNORMAL HIGH (ref 0.44–1.00)
GFR calc Af Amer: 13 mL/min — ABNORMAL LOW (ref >60)
GFR calc non Af Amer: 11 mL/min — ABNORMAL LOW (ref >60)
Glucose, Bld: 76 mg/dL (ref 70–99)
Potassium: 4.6 mmol/L (ref 3.5–5.1)
Sodium: 140 mmol/L (ref 135–145)
Total Bilirubin: 0.7 mg/dL (ref 0.3–1.2)
Total Protein: 5.5 g/dL — ABNORMAL LOW (ref 6.5–8.1)

## 2019-01-11 LAB — C-REACTIVE PROTEIN: CRP: 20.3 mg/dL — ABNORMAL HIGH (ref <1.0)

## 2019-01-11 LAB — MAGNESIUM: Magnesium: 1.4 mg/dL — ABNORMAL LOW (ref 1.7–2.4)

## 2019-01-11 LAB — HIV ANTIBODY (ROUTINE TESTING W REFLEX): HIV Screen 4th Generation wRfx: NONREACTIVE

## 2019-01-11 LAB — SARS CORONAVIRUS 2 (TAT 6-24 HRS): SARS Coronavirus 2: NEGATIVE

## 2019-01-11 LAB — URIC ACID: Uric Acid, Serum: 11.5 mg/dL — ABNORMAL HIGH (ref 2.5–7.1)

## 2019-01-11 LAB — SEDIMENTATION RATE: Sed Rate: 13 mm/hr (ref 0–22)

## 2019-01-11 MED ORDER — HYDROCORTISONE NA SUCCINATE PF 100 MG IJ SOLR
100.0000 mg | Freq: Three times a day (TID) | INTRAMUSCULAR | Status: DC
  Administered 2019-01-11 – 2019-01-14 (×8): 100 mg via INTRAVENOUS
  Filled 2019-01-11 (×8): qty 2

## 2019-01-11 MED ORDER — SODIUM CHLORIDE 0.9 % IV SOLN
INTRAVENOUS | Status: DC
  Administered 2019-01-11: 17:00:00 via INTRAVENOUS

## 2019-01-11 MED ORDER — SODIUM CHLORIDE 0.9 % IV SOLN
2.0000 g | INTRAVENOUS | Status: DC
  Administered 2019-01-11 – 2019-01-15 (×5): 2 g via INTRAVENOUS
  Filled 2019-01-11 (×6): qty 2

## 2019-01-11 MED ORDER — ONDANSETRON HCL 4 MG/2ML IJ SOLN
4.0000 mg | Freq: Four times a day (QID) | INTRAMUSCULAR | Status: DC | PRN
  Administered 2019-01-15 – 2019-01-16 (×3): 4 mg via INTRAVENOUS
  Filled 2019-01-11 (×3): qty 2

## 2019-01-11 MED ORDER — ACETAMINOPHEN 325 MG PO TABS
650.0000 mg | ORAL_TABLET | Freq: Four times a day (QID) | ORAL | Status: DC | PRN
  Administered 2019-01-19 – 2019-01-20 (×2): 650 mg via ORAL

## 2019-01-11 MED ORDER — HYDROMORPHONE HCL 1 MG/ML IJ SOLN
0.5000 mg | Freq: Once | INTRAMUSCULAR | Status: AC
  Administered 2019-01-11: 0.5 mg via INTRAVENOUS
  Filled 2019-01-11: qty 1

## 2019-01-11 MED ORDER — VANCOMYCIN VARIABLE DOSE PER UNSTABLE RENAL FUNCTION (PHARMACIST DOSING): Status: DC

## 2019-01-11 MED ORDER — ACETAMINOPHEN 325 MG PO TABS
650.0000 mg | ORAL_TABLET | ORAL | Status: DC | PRN
  Administered 2019-01-11 – 2019-01-21 (×6): 650 mg via ORAL
  Filled 2019-01-11 (×9): qty 2

## 2019-01-11 MED ORDER — ACETAMINOPHEN 650 MG RE SUPP: 650.0000 mg | Freq: Four times a day (QID) | RECTAL | Status: DC | PRN

## 2019-01-11 MED ORDER — HEPARIN SODIUM (PORCINE) 5000 UNIT/ML IJ SOLN
5000.0000 [IU] | Freq: Three times a day (TID) | INTRAMUSCULAR | Status: DC
  Administered 2019-01-11 – 2019-01-20 (×25): 5000 [IU] via SUBCUTANEOUS
  Filled 2019-01-11 (×26): qty 1

## 2019-01-11 MED ORDER — HYDROMORPHONE HCL 1 MG/ML IJ SOLN
1.0000 mg | INTRAMUSCULAR | Status: DC | PRN
  Administered 2019-01-11: 0.5 mg via INTRAVENOUS
  Administered 2019-01-11 – 2019-01-15 (×10): 1 mg via INTRAVENOUS
  Filled 2019-01-11 (×11): qty 1

## 2019-01-11 MED ORDER — VANCOMYCIN HCL 10 G IV SOLR
1500.0000 mg | Freq: Once | INTRAVENOUS | Status: DC
  Filled 2019-01-11: qty 1500

## 2019-01-11 MED ORDER — HYDROXYCHLOROQUINE SULFATE 200 MG PO TABS
200.0000 mg | ORAL_TABLET | Freq: Two times a day (BID) | ORAL | Status: DC
  Administered 2019-01-11 – 2019-01-24 (×19): 200 mg via ORAL
  Filled 2019-01-11 (×22): qty 1

## 2019-01-11 MED ORDER — MAGNESIUM SULFATE 2 GM/50ML IV SOLN
2.0000 g | Freq: Once | INTRAVENOUS | Status: AC
  Administered 2019-01-11: 2 g via INTRAVENOUS
  Filled 2019-01-11: qty 50

## 2019-01-11 MED ORDER — ONDANSETRON HCL 4 MG PO TABS
4.0000 mg | ORAL_TABLET | Freq: Four times a day (QID) | ORAL | Status: DC | PRN
  Administered 2019-01-19: 4 mg via ORAL
  Filled 2019-01-11: qty 1

## 2019-01-11 NOTE — Consult Note (Signed)
Carolyn [facility]  HISTORY AND PHYSICAL  Carolyn Zamora is an 63 y.o. female.    Chief Complaint: foot pain and AKI  HPI: Pt is a 27F with a PMH sig for CKD Stage IV, lupus, RCC s/p L nephrectomy, gout, HTN, h/o warm hemolytic anemia, ITP, s/p splenectomy, h/o AVN of the hip, and h/o severe AKI requiring dialysis in 2018 as a result of septic shock who is now seen in consultation at the request of Dr. Doristine Bosworth for eval and recs re: AKI on CKD.    Pt [facility] reporting 5 days of bilateral foot pain.  Also reporting abd and back pain.  Noted her last known Cr was 2.89 08/11/2018 in [facility]; last Cr in her primary nephrologist notes from 10/07/2018 was 2.52.  She was hospitalized earlier in March 2020 for a gout flare--> doesn't appear she's on meds.   She was noted to have a leukocytosis of 20 on arrival, hgb 8.7, plts 199, Cr of 4.42, K 4.1, CO2 18, Ca 7.6, Mag 1.2, normal CK, normal LFTs.  Uric acid 11.5.  R foot x-ray with severe 1st MTP degerative changes.  UA looks dirty, CXR negative, COVID negative.  Was given IVFs and Rocephin and transferred to [facility].  Blood cultures were collected.  In this setting we are asked to see.  In regards to lupus, she is followed by her nephrologist, Adegoroye, and Dr. Gerilyn Nestle (rheumatology).  Last seen in Feb 2020.  On Pred 5 mg daily, CellCept 500 mg BID, and plaquenil 200 mg BID.  Does not seem she has had a kidney biopsy (at least that I could locate in [facility]); notes do not mention lupus nephritis.  Pt currently lying in bed, says she feels "poorly", trying to get some rest.  Vanc/ cefepime have been ordered as well as CT abd/ pelvis.  Venous dopplers to rule out DVT.  Repeat labs show WBC 28 with cr 4.02.  PMH: Past Medical History:  Diagnosis Date  . Abdominal wall hematoma 08/01/2017  . Anemia   . AVN (avascular necrosis of bone) ([facility]) 08/01/2017  . CKD (chronic kidney disease), stage IV ([place])   .  History of dental abscess with sepsis 2017 08/02/2017  . History of kidney cancer   . Hypertension   . Lupus ([place])   . 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 Ventral hernia without obstruction or gangrene 10/24/2015   PSH: Past Surgical History:  Procedure Laterality Date  . INCISIONAL HERNIA REPAIR  11/19/2015   [facility].  Dr Phineas Douglas.  left [facility] repair w mesh  . MULTIPLE TOOTH EXTRACTIONS    . NEPHRECTOMY    . ORIF ANKLE FRACTURE  11/16/2015   [facility] Cohen, 2017  . SPLENECTOMY      Past Medical History:  Diagnosis Date  . Abdominal wall hematoma 08/01/2017  . Anemia   . AVN (avascular necrosis of bone) ([facility]yn Zamora) 08/01/2017  . CKD (chronic kidney disease), stage IV (Carolyn Zamora)   . History of dental abscess with sepsis 2017 08/02/2017  . History of kidney cancer   . Hypertension   . Lupus (Carolyn Zamora)   . 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 Ventral hernia without obstruction or gangrene 10/24/2015    Medications:   Scheduled: . heparin  5,000 Units Subcutaneous Q8H  . vancomycin variable dose per unstable renal function (pharmacist dosing)   Does not apply See admin instructions    Medications Prior to  Admission  Medication Sig Dispense Refill  . amLODipine (NORVASC) 10 MG tablet Take 10 mg by mouth daily.    [facility] dicyclomine (BENTYL) 20 MG tablet Take 1 tablet (20 mg total) by mouth 2 (two) times daily as needed for spasms. 15 tablet 0  . DULoxetine (CYMBALTA) 30 MG capsule Take 60 mg by mouth daily.     . furosemide (LASIX) 40 MG tablet Take 40 mg by mouth daily.  1  . HYDROcodone-acetaminophen (NORCO/VICODIN) 5-325 MG tablet Take 1 tablet by mouth every 8 (eight) hours as needed for pain.    . hydroxychloroquine (PLAQUENIL) 200 MG tablet Take 200 mg by mouth daily.    . hydrOXYzine (ATARAX/VISTARIL) 50 MG tablet Take 50 mg by mouth 3 (three) times daily as needed for nausea.   1  . lidocaine (LIDODERM) 5 % Place 1 patch onto the skin daily.  Remove & Discard patch within 12 hours or as directed by MD 30 patch 0  . mycophenolate (CELLCEPT) 500 MG tablet Take 500 mg by mouth daily.    . predniSONE (DELTASONE) 10 MG tablet Take 10 mg by mouth daily with breakfast.    . pregabalin (LYRICA) 150 MG capsule Take 1 capsule (150 mg total) by mouth daily. 30 capsule 0  . traZODone (DESYREL) 50 MG tablet Take 50 mg by mouth daily.  0  . Vitamin D, Ergocalciferol, (DRISDOL) 50000 units CAPS capsule Take 1 capsule by mouth once a week.  1    ALLERGIES:  No Known Allergies  FAM HX: Family History  Problem Relation Age of Onset  . Hypertension Mother   . Diabetes Mellitus I Mother   . Lung cancer Father   . Hypertension Sister   . Colon cancer Brother     Social History:   reports that she has never smoked. She has never used smokeless tobacco. She reports that she does not drink alcohol or use drugs.  ROS: ROS: all other systems reviewed and are negative except as per HPI  Blood pressure (!) 109/59, pulse (!) 118, temperature 98.3 F (36.8 C), temperature source Oral, resp. rate 19, height _0  (1.575 m), weight 75.8 kg, SpO2 100 %. PHYSICAL EXAM: Physical Exam  GEN: older woman, lying in bed, appears unwell, + rigors HEENT edentulous, EOMI PERRL NECK no JVD PULM normal WOB, clear anteriorly CV tachycardic, II/VI systolic murmur ABD obese, mildly diffusely tender EXT declined examination of legs NEURO AAO x 3 nonfocal SKIN no rashes.      Results for orders placed or performed during the hospital encounter of 01/10/19 (from the past 48 hour(s))  CBC with Differential     Status: Abnormal   Collection Time: 01/10/19  3:49 PM  Result Value Ref Range   WBC 20.3 (H) 4.0 - 10.5 K/uL   RBC 3.98 3.87 - 5.11 MIL/uL   Hemoglobin 8.9 (L) 12.0 - 15.0 g/dL    Comment: Reticulocyte Hemoglobin testing may be clinically indicated, consider ordering this additional test [id number]    HCT 29.1 (L) 36.0 - 46.0 %   MCV 73.1 (L) 80.0  - 100.0 fL   MCH 22.4 (L) 26.0 - 34.0 pg   MCHC 30.6 30.0 - 36.0 g/dL   RDW 15.3 11.5 - 15.5 %   Platelets 199 150 - 400 K/uL   nRBC 0.0 0.0 - 0.2 %   Neutrophils Relative % 79 %   Neutro Abs 15.9 (H) 1.7 - 7.7 K/uL   Lymphocytes Relative 10 %   Lymphs  Abs 2.0 0.7 - 4.0 K/uL   Monocytes Relative 10 %   Monocytes Absolute 2.1 (H) 0.1 - 1.0 K/uL   Eosinophils Relative 0 %   Eosinophils Absolute 0.1 0.0 - 0.5 K/uL   Basophils Relative 0 %   Basophils Absolute 0.0 0.0 - 0.1 K/uL   Immature Granulocytes 1 %   Abs Immature Granulocytes 0.22 (H) 0.00 - 0.07 K/uL    Comment: Performed at [facility], [address] [place], [place] [address]  Comprehensive metabolic panel     Status: Abnormal   Collection Time: 01/10/19  3:49 PM  Result Value Ref Range   Sodium 141 135 - 145 mmol/L   Potassium 4.1 3.5 - 5.1 mmol/L   Chloride 113 (H) 98 - 111 mmol/L   CO2 18 (L) 22 - 32 mmol/L   Glucose, Bld 75 70 - 99 mg/dL   BUN 51 (H) 8 - 23 mg/dL   Creatinine, Ser 4.42 (H) 0.44 - 1.00 mg/dL   Calcium 7.6 (L) 8.9 - 10.3 mg/dL   Total Protein 5.9 (L) 6.5 - 8.1 g/dL   Albumin 3.0 (L) 3.5 - 5.0 g/dL   AST 17 15 - 41 U/L   ALT 20 0 - 44 U/L   Alkaline Phosphatase 91 38 - 126 U/L   Total Bilirubin 0.8 0.3 - 1.2 mg/dL   GFR calc non Af Amer 10 (L) >60 mL/min   GFR calc Af Amer 12 (L) >60 mL/min   Anion gap 10 5 - 15    Comment: Performed at [facility], [address] [place], [place] [address]  Lipase, blood     Status: None   Collection Time: 01/10/19  3:49 PM  Result Value Ref Range   Lipase 16 11 - 51 U/L    Comment: Performed at [facility], [address] [place], [place] [address]  Urinalysis, Routine w reflex microscopic     Status: Abnormal   Collection Time: 01/10/19  3:49 PM  Result Value Ref Range   Color, Urine YELLOW YELLOW   APPearance CLOUDY (A) CLEAR   Specific Gravity, Urine 1.020 1.005 - 1.030   pH 6.0 5.0 - 8.0   Glucose, UA NEGATIVE  NEGATIVE mg/dL   Hgb urine dipstick SMALL (A) NEGATIVE   Bilirubin Urine NEGATIVE NEGATIVE   Ketones, ur NEGATIVE NEGATIVE mg/dL   Protein, ur 30 (A) NEGATIVE mg/dL   Nitrite NEGATIVE NEGATIVE   Leukocytes,Ua SMALL (A) NEGATIVE    Comment: Performed at [facility], [address] [place], [place] [address]  Magnesium     Status: Abnormal   Collection Time: 01/10/19  3:49 PM  Result Value Ref Range   Magnesium 1.2 (L) 1.7 - 2.4 mg/dL    Comment: Performed at [facility], [address] [place], [place] [address]  CK     Status: None   Collection Time: 01/10/19  3:49 PM  Result Value Ref Range   Total CK 42 38 - 234 U/L    Comment: Performed at [facility], [address] [place], [place] [address]  SARS Coronavirus 2 Ag (30 min TAT) - Nasal Swab (BD Veritor Kit)     Status: None   Collection Time: 01/10/19  3:49 PM   Specimen: Nasal Swab (BD Veritor Kit)  Result Value Ref Range   SARS Coronavirus 2 Ag NEGATIVE NEGATIVE    Comment: (NOTE) SARS-CoV-2 antigen  NOT DETECTED.  Negative results are presumptive.  Negative results do not preclude SARS-CoV-2 infection and should not be used as the sole basis for treatment or other patient management decisions, including infection  control decisions, particularly in the presence of clinical signs and  symptoms consistent with COVID-19, or in those who have been in contact with the virus.  Negative results must be combined with clinical observations, patient history, and epidemiological information. The expected result is Negative. Fact Sheet for Patients: PodPark.tn Fact Sheet for Healthcare Providers: GiftContent.is This test is not yet approved or cleared by the [place] FDA and  has been authorized for detection and/or diagnosis of SARS-CoV-2 by FDA under an Emergency Use Authorization (EUA).  This EUA will remain in effect (meaning  this test can be used) for the duration of  the COVID-19 de claration under Section 564(b)(1) of the Act, 21 U.S.C. section 360bbb-3(b)(1), unless the authorization is terminated or revoked sooner. Performed at [facility], [address] [place], [place] [address]   Urinalysis, Microscopic (reflex)     Status: Abnormal   Collection Time: 01/10/19  3:49 PM  Result Value Ref Range   RBC / HPF 0-5 0 - 5 RBC/hpf   WBC, UA 21-50 0 - 5 WBC/hpf   Bacteria, UA MANY (A) NONE SEEN   Squamous Epithelial / LPF 0-5 0 - 5    Comment: Performed at [facility], [address] [place], [place] [address]  SARS CORONAVIRUS 2 (TAT 6-24 HRS) Nasopharyngeal Urine, Catheterized     Status: None   Collection Time: 01/10/19  6:03 PM   Specimen: Urine, Catheterized; Nasopharyngeal  Result Value Ref Range   SARS Coronavirus 2 NEGATIVE NEGATIVE    Comment: (NOTE) SARS-CoV-2 target nucleic acids are NOT DETECTED. The SARS-CoV-2 RNA is generally detectable in upper and lower respiratory specimens during the acute phase of infection. Negative results do not preclude SARS-CoV-2 infection, do not rule out co-infections with other pathogens, and should not be used as the sole basis for treatment or other patient management decisions. Negative results must be combined with clinical observations, patient history, and epidemiological information. The expected result is Negative. Fact Sheet for Patients: SugarRoll.be Fact Sheet for Healthcare Providers: https://www.woods-mathews.com/ This test is not yet approved or cleared by the [place] FDA and  has been authorized for detection and/or diagnosis of SARS-CoV-2 by FDA under an Emergency Use Authorization (EUA). This EUA will remain  in effect (meaning this test can be used) for the duration of the COVID-19 declaration under Section 56 4(b)(1) of the Act, 21 U.S.C. section 360bbb-3(b)(1),  unless the authorization is terminated or revoked sooner. Performed at [facility], [place] [address] [place], [place] [address]   Magnesium     Status: Abnormal   Collection Time: 01/11/19  6:31 AM  Result Value Ref Range   Magnesium 1.4 (L) 1.7 - 2.4 mg/dL    Comment: Performed at [facility], [address] [place], [place] [address]  HIV Antibody (routine testing w rflx)     Status: None   Collection Time: 01/11/19  4:48 PM  Result Value Ref Range   HIV Screen 4th Generation wRfx NON REACTIVE NON REACTIVE    Comment: Performed at [facility], [place] [address], [place], [place] [address]  CBC WITH DIFFERENTIAL     Status: Abnormal   Collection Time: 01/11/19  4:48 PM  Result Value Ref Range  WBC 28.5 (H) 4.0 - 10.5 K/uL   RBC 3.81 (L) 3.87 - 5.11 MIL/uL   Hemoglobin 8.7 (L) 12.0 - 15.0 g/dL    Comment: Reticulocyte Hemoglobin testing may be clinically indicated, consider ordering this additional test [id number]    HCT 28.0 (L) 36.0 - 46.0 %   MCV 73.5 (L) 80.0 - 100.0 fL   MCH 22.8 (L) 26.0 - 34.0 pg   MCHC 31.1 30.0 - 36.0 g/dL   RDW 15.5 11.5 - 15.5 %   Platelets 206 150 - 400 K/uL   nRBC 0.0 0.0 - 0.2 %   Neutrophils Relative % 95 %   Neutro Abs 27.1 (H) 1.7 - 7.7 K/uL   Lymphocytes Relative 4 %   Lymphs Abs 1.1 0.7 - 4.0 K/uL   Monocytes Relative 1 %   Monocytes Absolute 0.3 0.1 - 1.0 K/uL   Eosinophils Relative 0 %   Eosinophils Absolute 0.0 0.0 - 0.5 K/uL   Basophils Relative 0 %   Basophils Absolute 0.0 0.0 - 0.1 K/uL   nRBC 0 0 /100 WBC   Abs Immature Granulocytes 0.00 0.00 - 0.07 K/uL   Polychromasia PRESENT    Target Cells PRESENT     Comment: Performed at [facility], [address] [place], [place] [address]  Comprehensive metabolic panel     Status: Abnormal   Collection Time: 01/11/19  4:48 PM  Result Value Ref Range   Sodium 140 135 - 145 mmol/L   Potassium 4.6 3.5 - 5.1 mmol/L   Chloride 113 (H) 98 - 111 mmol/L    CO2 14 (L) 22 - 32 mmol/L   Glucose, Bld 76 70 - 99 mg/dL   BUN 45 (H) 8 - 23 mg/dL   Creatinine, Ser 4.09 (H) 0.44 - 1.00 mg/dL   Calcium 7.7 (L) 8.9 - 10.3 mg/dL   Total Protein 5.5 (L) 6.5 - 8.1 g/dL   Albumin 2.4 (L) 3.5 - 5.0 g/dL   AST 13 (L) 15 - 41 U/L   ALT 17 0 - 44 U/L   Alkaline Phosphatase 99 38 - 126 U/L   Total Bilirubin 0.7 0.3 - 1.2 mg/dL   GFR calc non Af Amer 11 (L) >60 mL/min   GFR calc Af Amer 13 (L) >60 mL/min   Anion gap 13 5 - 15    Comment: Performed at [facility], [place] [address], [place], [place] [address]  Uric acid     Status: Abnormal   Collection Time: 01/11/19  4:48 PM  Result Value Ref Range   Uric Acid, Serum 11.5 (H) 2.5 - 7.1 mg/dL    Comment: Performed at [facility] [address], [place], [place] [address]  Sedimentation rate     Status: None   Collection Time: 01/11/19  4:48 PM  Result Value Ref Range   Sed Rate 13 0 - 22 mm/hr    Comment: Performed at [facility] [address], [place], [place] [address]  C-reactive protein     Status: Abnormal   Collection Time: 01/11/19  4:48 PM  Result Value Ref Range   CRP 20.3 (H) <1.0 mg/dL    Comment: Performed at [facility], [place] [address], [place], [place] [address]  Lactic acid, plasma     Status: None   Collection Time: 01/11/19  6:07 PM  Result Value Ref Range   Lactic Acid, Venous 0.9 0.5 - 1.9 mmol/L    Comment:  Performed at [facility], [place] [address], [place],  [address]    Dg Chest Port 1 View  Result Date: 01/10/2019 CLINICAL DATA:  Body aches and fevers. EXAM: PORTABLE CHEST 1 VIEW COMPARISON:  July 31, 2017 FINDINGS: The heart size is enlarged. There is no pneumothorax. No large pleural effusion. There is likely atelectasis at the lung bases. Aortic calcifications are noted. IMPRESSION: 1. Cardiomegaly without edema or pleural effusion. 2. Bibasilar atelectasis. 3. Aortic atherosclerosis. Electronically Signed   By: Constance Holster  M.D.   On: 01/10/2019 16:52   Dg Foot Complete Right  Result Date: 01/10/2019 CLINICAL DATA:  Foot pain pain EXAM: RIGHT FOOT COMPLETE - 3+ VIEW COMPARISON:  11/16/2015. FINDINGS: There are advanced degenerative changes of the first metatarsophalangeal joint. There is osteopenia. There is no acute displaced fracture. No dislocation. The patient is status post prior ORIF of the ankle. Vascular calcifications are noted. IMPRESSION: No acute osseous abnormality. Electronically Signed   By: Constance Holster M.D.   On: 01/10/2019 15:09    Assessment/Plan  1.  Severe sepsis: tachycardic, leukocytosis.  WBC 28, cultures taken, got rocephin, getting vanc/ cefepime.  Urine is dirty, likely source of infection.  Xray of R foot with degenerative changes 1st MTP.  She declined the examination of her legs by me so hard to tell if it's gout vs cellulitis vs DVT.  I'll do stress dose steroids, will hold cellcept, continue plaquenil.    2.  Acute on chronic kidney injury: baseline Cr 2.52 --> 4.4, now getting better with IVFs.  I'll get complements and ANA/ dsDNA to try to assess lupus flare.  I'll do a UP/C but will have to be interpreted in setting of dirty UA.  CT abd/ pelvis pending  3.  Acute R foot pain: uric acid 11.5, possibly gout, LE dopplers pending, steroids as above  4.  SLE: on pred 5 mg daily, plaquenil 200 mg BID, and cellcept 500 mg BID, hold cellcept, stress dose steroids, and continue plaquenil.  Serologies as above  5.  H/o warm hemolytic anemia: hgb 8.7, down from baseline, Tbili WNL, will get LDH and haptoglobin as well  6.  H/o ITP: plts are normal  7.  Dispo; 5W.  Madelon Lips 01/11/2019, 7:32 PM

## 2019-01-11 NOTE — ED Notes (Signed)
Pt requesting pain medication. Dr. Roxanne Mins aware.

## 2019-01-11 NOTE — Progress Notes (Addendum)
Pharmacy Antibiotic Note  Carolyn Zamora is a 63 y.o. female admitted from [place] on 01/10/2019 with foot pain and worsening renal function.  Pharmacy has been consulted for vancomycin and cefepime dosing for sepsis.  Medical history includes: renal cell carcinoma (S/P left nephrectomy), history of splenectomy, AKI on CKD stage III, chronic pain syndrome, HTN, morbid obesity, lupus-on chronic immunosuppressive therapy (mycophenolate, hydroxychloroquine, prednisone), hypertension, anemia of chronic disease.  WBC 28.5, afebrile; CRP 20.3; Scr 4.09 (baseline ~1.7-1.99); CrCl 13.6 ml/min  Plan: Cefepime 2 gm IV Q 24 hrs Vancomycin 1500 mg IV X 1; because pt's renal function is unstable, will dose vancomycin by levels at this point Check random vancomycin level, Scr with AM labs tomorrow to evaluate vancomycin clearance and renal function trend Monitor renal function, WBC, temp, clinical improvement, cultures, vancomycin levels  Height: 5\' 2"  (157.5 cm) Weight: 167 lb (75.8 kg) IBW/kg (Calculated) : 50.1  Temp (24hrs), Avg:98.4 F (36.9 C), Min:98.3 F (36.8 C), Max:98.4 F (36.9 C)  Recent Labs  Lab 01/10/19 1549 01/11/19 1648  WBC 20.3* 28.5*  CREATININE 4.42* 4.09*    Estimated Creatinine Clearance: 13.6 mL/min (A) (by C-G formula based on SCr of 4.09 mg/dL (H)).    No Known Allergies  Antimicrobials this admission: 11/30 ceftriaxone X 1  Microbiology results: 11/6  MRSA PC negative 11/30 COVID: negative  Thank you for allowing pharmacy to be a part of this patient's care.  Gillermina Hu, PharmD, BCPS, [facility] Clinical Pharmacist 01/11/2019 6:00 PM

## 2019-01-11 NOTE — H&P (Addendum)
History and Physical    Carolyn Zamora AVW:[id number] DOB: [birth year] DOA: 01/10/2019  PCP: Beckie Salts, MD  Patient coming from: [facility]  I have personally briefly reviewed patient's old medical records in [place]  Chief Complaint: Feet pain since 5 days, worsening renal function  HPI: Carolyn Zamora is a 63 y.o. female with medical history significant of renal cell carcinoma status post left nephrectomy, history of splenectomy, CKD stage III, chronic pain syndrome, morbid obesity, lupus-on chronic immunosuppressive therapy (mycophenolate, hydroxychloroquine, prednisone), hypertension, anemia of chronic disease came from [place] for the concern of sepsis secondary to UTI and worsening kidney function.  Patient tells me that she has feet pain which is severe in intensity since 5 days.  Unable to walk/move, could not sleep at night, no aggravating or relieving factors, associated with chills denies association with trauma, fever, decreased appetite, generalized weakness or lethargy.  Had history of gout however currently not on any medicines.  X-ray of right foot obtained at [facility]-which came back negative.  She reports lower abdominal pain and chronic back pain however denies urinary symptoms such as dysuria, hematuria, foul-smelling urine, nocturia, nausea, vomiting.  She lives alone at home and her daughter and sister helps her.  No history of smoking, alcohol, illicit drug use.  At [facility]: Patient was tachycardic, tachypneic, leukocytosis of 20.3, UA positive for small leukocytes.  CK, lipase level: WNL, COVID-19 negative, magnesium: Low-- was replaced.  CMP shows worsening kidney function.  Patient received IV fluids and Rocephin for the concern of urosepsis.  Patient transferred to [facility] for further evaluation and management.  Review of Systems: As per HPI otherwise negative.    Past Medical History:  Diagnosis Date  . Abdominal wall hematoma  08/01/2017  . Anemia   . AVN (avascular necrosis of bone) ([place]) 08/01/2017  . CKD (chronic kidney disease), stage IV ([place])   . History of dental abscess with sepsis 2017 08/02/2017  . History of kidney cancer   . Hypertension   . Lupus ([place])   . 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 Ventral hernia without obstruction or gangrene 10/24/2015    Past Surgical History:  Procedure Laterality Date  . INCISIONAL HERNIA REPAIR  11/19/2015   [facility].  Dr Phineas Douglas.  left VWH repair w mesh  . MULTIPLE TOOTH EXTRACTIONS    . NEPHRECTOMY    . ORIF ANKLE FRACTURE  11/16/2015   Max Cohen, 2017  . SPLENECTOMY       reports that she has never smoked. She has never used smokeless tobacco. She reports that she does not drink alcohol or use drugs.  No Known Allergies  Family History  Problem Relation Age of Onset  . Hypertension Mother   . Diabetes Mellitus I Mother   . Lung cancer Father   . Hypertension Sister   . Colon cancer Brother     Prior to Admission medications   Medication Sig Start Date End Date Taking? Authorizing Provider  amLODipine (NORVASC) 10 MG tablet Take 10 mg by mouth daily. 07/23/17  Yes [provider]  dicyclomine (BENTYL) 20 MG tablet Take 1 tablet (20 mg total) by mouth 2 (two) times daily as needed for spasms. 01/25/18  Yes Caccavale, Sophia, PA-C  DULoxetine (CYMBALTA) 30 MG capsule Take 60 mg by mouth daily.  10/28/17  Yes [provider]  furosemide (LASIX) 40 MG tablet Take 40 mg by mouth daily. 07/02/17  Yes [provider]  HYDROcodone-acetaminophen (NORCO/VICODIN) 5-325 MG tablet Take 1 tablet by mouth every 8 (eight) hours as needed for pain. 12/15/15  Yes [provider]  hydroxychloroquine (PLAQUENIL) 200 MG tablet Take 200 mg by mouth daily. 12/15/15  Yes [provider]  hydrOXYzine (ATARAX/VISTARIL) 50 MG tablet Take 50 mg by mouth 3 (three) times daily as needed for nausea.  07/06/17  Yes  [provider]  lidocaine (LIDODERM) 5 % Place 1 patch onto the skin daily. Remove & Discard patch within 12 hours or as directed by MD 12/18/17  Yes Georgette Shell, MD  mycophenolate (CELLCEPT) 500 MG tablet Take 500 mg by mouth daily. 03/18/17  Yes [provider]  predniSONE (DELTASONE) 10 MG tablet Take 10 mg by mouth daily with breakfast.   Yes [provider]  pregabalin (LYRICA) 150 MG capsule Take 1 capsule (150 mg total) by mouth daily. 12/18/17  Yes Georgette Shell, MD  traZODone (DESYREL) 50 MG tablet Take 50 mg by mouth daily. 06/29/17  Yes [provider]  Vitamin D, Ergocalciferol, (DRISDOL) 50000 units CAPS capsule Take 1 capsule by mouth once a week. 07/01/17  Yes [provider]    Physical Exam: Vitals:   01/11/19 1300 01/11/19 1330 01/11/19 1400 01/11/19 1500  BP: (!) 149/73 (!) 158/78 117/86 (!) 126/115  Pulse: (!) 115 (!) 110 (!) 113 (!) 120  Resp: '20 18 19 ' (!) 24  Temp:    98.4 F (36.9 C)  TempSrc:    Axillary  SpO2: 96% 98% 100% 100%  Weight:      Height:        Constitutional: NAD, calm, comfortable Eyes: PERRL, lids and conjunctivae normal ENMT: Mucous membranes are moist. Posterior pharynx clear of any exudate or lesions.Normal dentition.  Neck: normal, supple, no masses, no thyromegaly Respiratory: clear to auscultation bilaterally, no wheezing, no crackles. Normal respiratory effort. No accessory muscle use.  Cardiovascular: Regular rate and rhythm, no murmurs / rubs / gallops. No extremity edema. 2+ pedal pulses. No carotid bruits.  Abdomen: no tenderness, no masses palpated. No hepatosplenomegaly. Bowel sounds positive.  Musculoskeletal: Feet: Mildly swollen, nonerythematous, not warmth tender to touch, 2+ pedal pulses noted in both feet. Skin: Hyperpigmented skin spots noted all over the body.  Not infected, no signs of bleeding noted.  No pressure ulcers noted on sacral area. Neurologic: CN 2-12  grossly intact. Sensation intact, DTR normal. Strength 5/5 in all 4.  Psychiatric: Normal judgment and insight. Alert and oriented x 3. Normal mood.    Labs on Admission: I have personally reviewed following labs and imaging studies  CBC: Recent Labs  Lab 01/10/19 1549  WBC 20.3*  NEUTROABS 15.9*  HGB 8.9*  HCT 29.1*  MCV 73.1*  PLT 583   Basic Metabolic Panel: Recent Labs  Lab 01/10/19 1549 01/11/19 0631  NA 141  --   K 4.1  --   CL 113*  --   CO2 18*  --   GLUCOSE 75  --   BUN 51*  --   CREATININE 4.42*  --   CALCIUM 7.6*  --   MG 1.2* 1.4*   GFR: Estimated Creatinine Clearance: 12.6 mL/min (A) (by C-G formula based on SCr of 4.42 mg/dL (H)). Liver Function Tests: Recent Labs  Lab 01/10/19 1549  AST 17  ALT 20  ALKPHOS 91  BILITOT 0.8  PROT 5.9*  ALBUMIN 3.0*   Recent Labs  Lab 01/10/19 1549  LIPASE 16   No results for input(s):  AMMONIA in the last 168 hours. Coagulation Profile: No results for input(s): INR, PROTIME in the last 168 hours. Cardiac Enzymes: Recent Labs  Lab 01/10/19 1549  CKTOTAL 42   BNP (last 3 results) No results for input(s): PROBNP in the last 8760 hours. HbA1C: No results for input(s): HGBA1C in the last 72 hours. CBG: No results for input(s): GLUCAP in the last 168 hours. Lipid Profile: No results for input(s): CHOL, HDL, LDLCALC, TRIG, CHOLHDL, LDLDIRECT in the last 72 hours. Thyroid Function Tests: No results for input(s): TSH, T4TOTAL, FREET4, T3FREE, THYROIDAB in the last 72 hours. Anemia Panel: No results for input(s): VITAMINB12, FOLATE, FERRITIN, TIBC, IRON, RETICCTPCT in the last 72 hours. Urine analysis:    Component Value Date/Time   COLORURINE YELLOW 01/10/2019 1549   APPEARANCEUR CLOUDY (A) 01/10/2019 1549   LABSPEC 1.020 01/10/2019 1549   PHURINE 6.0 01/10/2019 1549   GLUCOSEU NEGATIVE 01/10/2019 1549   HGBUR SMALL (A) 01/10/2019 1549   BILIRUBINUR NEGATIVE 01/10/2019 1549   KETONESUR NEGATIVE  01/10/2019 1549   PROTEINUR 30 (A) 01/10/2019 1549   NITRITE NEGATIVE 01/10/2019 1549   LEUKOCYTESUR SMALL (A) 01/10/2019 1549    Radiological Exams on Admission: Dg Chest Port 1 View  Result Date: 01/10/2019 CLINICAL DATA:  Body aches and fevers. EXAM: PORTABLE CHEST 1 VIEW COMPARISON:  July 31, 2017 FINDINGS: The heart size is enlarged. There is no pneumothorax. No large pleural effusion. There is likely atelectasis at the lung bases. Aortic calcifications are noted. IMPRESSION: 1. Cardiomegaly without edema or pleural effusion. 2. Bibasilar atelectasis. 3. Aortic atherosclerosis. Electronically Signed   By: Constance Holster M.D.   On: 01/10/2019 16:52   Dg Foot Complete Right  Result Date: 01/10/2019 CLINICAL DATA:  Foot pain pain EXAM: RIGHT FOOT COMPLETE - 3+ VIEW COMPARISON:  11/16/2015. FINDINGS: There are advanced degenerative changes of the first metatarsophalangeal joint. There is osteopenia. There is no acute displaced fracture. No dislocation. The patient is status post prior ORIF of the ankle. Vascular calcifications are noted. IMPRESSION: No acute osseous abnormality. Electronically Signed   By: Constance Holster M.D.   On: 01/10/2019 15:09    Assessment/Plan Principal Problem:   Sepsis ([place]) Active Problems:   Hypertension   Systemic lupus erythematosus (HCC)   Chronic pain syndrome   Renal cell carcinoma of left kidney s/p nephrectomy   Anemia of chronic disease   Renal failure (ARF), acute on chronic (HCC)   Pain in both feet   Hypomagnesemia   Sepsis of unknown etiology: -Unlikely secondary to urinary tract infection-patient denies any urinary symptoms.  UA is positive only for trace leukocytes. -Patient is afebrile, tachycardic, tachypneic, leukocytosis of 20.3-->Trended up to 28.5 today, chest x-ray and right foot x-ray: Negative.  COVID-19 negative, lipase: WNL.  -Patient does not have chronic leukocytosis although she is on prednisone at home.   Will check  lactic acid and blood culture and procalcitonin level. -Patient has feet pain, worsening kidney function along with anemia-multiple myeloma could be consideration?. -Patient is obese with limited mobility due to feet pain with history of lupus-she is on intermediate risk for PE however could not get CT angiogram due to worsening kidney function and D-dimer will be non specific due to worsening kidney function. -We will get Doppler ultrasound of bilateral lower extremity to rule out DVT. -We will also get CT abdomen/pelvis without contrast to rule out obstructive uropathy/hydronephrosis. -We will start patient on broad-spectrum antibiotics Vanco and cefepime now.  Follow blood culture.  Feet  pain: -Unknown etiology?,  Had a history of gout in the past. -We will check uric acid, CRP, sed rate -Dilaudid as needed for pain control  AKI on CKD stage III: -Likely secondary from underlying infection -Patient has history of left nephrectomy due to renal cell carcinoma. -Potassium: WNL.  Will get renal ultrasound -Consulted nephrology for further evaluation and management.  Avoid nephrotoxic medication and monitor kidney function closely.  Microcytic anemia: -H&H: 8.7/28.0 today was 8.9/29.1 yesterday -No signs of active bleeding, monitor H&H closely. -We will check iron studies.  Hypomagnesemia: Replaced -Repeat level tomorrow a.m.  Lupus: Continue mycophenolate, prednisone, hydroxychloroquine  Hypertension: On amlodipine at home will continue same -Monitor her blood pressure closely.  Depression/anxiety: On trazodone, Atarax-we will continue same  Chronic pain syndrome: On Norco, Cymbalta, Lyrica at home-we will continue same.  Unable to safely start patient's home medication as pharmacy med reconciliation is pending.  DVT prophylaxis: TED/SCD/Heparin Code Status: Full code-confirmed with the patient Family Communication: None present at bedside.  Plan of care discussed with patient  in length and she verbalized understanding and agreed with it. Disposition Plan: TBD Consults called: Neurology Dr. Marval Regal Admission status: Inpatient   Mckinley Jewel MD [facility] Pager [phone]  If 7PM-7AM, please contact night-coverage [email or web address] Password [email or web address]  01/11/2019, 4:12 PM

## 2019-01-11 NOTE — Progress Notes (Addendum)
Pt arrived by [facility].  Severe bilateral foot pain, great toes are swollen and red, other toes are darkened in color, good pulses, very tender to touch, pt will not move them because of pain.   Dr. Doristine Bosworth at bedside.

## 2019-01-11 NOTE — ED Notes (Signed)
Left voicemail for emergency contact (sister) for a call back to let her know she has a bed an is being transferred shortly.

## 2019-01-11 NOTE — ED Provider Notes (Signed)
Still awaiting bed assignment.  She has been hemodynamically stable overnight but continues to complain of generalized pain.  Magnesium was very low initially and only received 1 g intravenously.  Will recheck magnesium level and may need to give additional magnesium supplement.   Delora Fuel, MD 55/73/22 [phone]

## 2019-01-12 ENCOUNTER — Inpatient Hospital Stay (HOSPITAL_COMMUNITY): Payer: Medicare Other

## 2019-01-12 DIAGNOSIS — M7989 Other specified soft tissue disorders: Secondary | ICD-10-CM | POA: Diagnosis not present

## 2019-01-12 DIAGNOSIS — R609 Edema, unspecified: Secondary | ICD-10-CM

## 2019-01-12 DIAGNOSIS — N184 Chronic kidney disease, stage 4 (severe): Secondary | ICD-10-CM

## 2019-01-12 LAB — URINALYSIS, COMPLETE (UACMP) WITH MICROSCOPIC
Bilirubin Urine: NEGATIVE
Glucose, UA: NEGATIVE mg/dL
Ketones, ur: NEGATIVE mg/dL
Nitrite: NEGATIVE
Protein, ur: NEGATIVE mg/dL
Specific Gravity, Urine: 1.011 (ref 1.005–1.030)
pH: 5 (ref 5.0–8.0)

## 2019-01-12 LAB — BASIC METABOLIC PANEL
Anion gap: 14 (ref 5–15)
BUN: 44 mg/dL — ABNORMAL HIGH (ref 8–23)
CO2: 13 mmol/L — ABNORMAL LOW (ref 22–32)
Calcium: 8.3 mg/dL — ABNORMAL LOW (ref 8.9–10.3)
Chloride: 111 mmol/L (ref 98–111)
Creatinine, Ser: 4.2 mg/dL — ABNORMAL HIGH (ref 0.44–1.00)
GFR calc Af Amer: 12 mL/min — ABNORMAL LOW (ref >60)
GFR calc non Af Amer: 11 mL/min — ABNORMAL LOW (ref >60)
Glucose, Bld: 90 mg/dL (ref 70–99)
Potassium: 4.9 mmol/L (ref 3.5–5.1)
Sodium: 138 mmol/L (ref 135–145)

## 2019-01-12 LAB — LACTATE DEHYDROGENASE: LDH: 238 U/L — ABNORMAL HIGH (ref 98–192)

## 2019-01-12 LAB — CREATININE, URINE, RANDOM: Creatinine, Urine: 73.01 mg/dL

## 2019-01-12 LAB — CBC
HCT: 30.3 % — ABNORMAL LOW (ref 36.0–46.0)
Hemoglobin: 9.4 g/dL — ABNORMAL LOW (ref 12.0–15.0)
MCH: 22.9 pg — ABNORMAL LOW (ref 26.0–34.0)
MCHC: 31 g/dL (ref 30.0–36.0)
MCV: 73.7 fL — ABNORMAL LOW (ref 80.0–100.0)
Platelets: 233 10*3/uL (ref 150–400)
RBC: 4.11 MIL/uL (ref 3.87–5.11)
RDW: 15.6 % — ABNORMAL HIGH (ref 11.5–15.5)
WBC: 36.6 10*3/uL — ABNORMAL HIGH (ref 4.0–10.5)
nRBC: 0 % (ref 0.0–0.2)

## 2019-01-12 LAB — FERRITIN: Ferritin: 371 ng/mL — ABNORMAL HIGH (ref 11–307)

## 2019-01-12 LAB — MAGNESIUM: Magnesium: 2.2 mg/dL (ref 1.7–2.4)

## 2019-01-12 LAB — PROTEIN, URINE, RANDOM: Total Protein, Urine: 20 mg/dL

## 2019-01-12 LAB — IRON AND TIBC
Iron: 14 ug/dL — ABNORMAL LOW (ref 28–170)
Saturation Ratios: 8 % — ABNORMAL LOW (ref 10.4–31.8)
TIBC: 186 ug/dL — ABNORMAL LOW (ref 250–450)
UIBC: 172 ug/dL

## 2019-01-12 LAB — VANCOMYCIN, RANDOM: Vancomycin Rm: <4

## 2019-01-12 LAB — SODIUM, URINE, RANDOM: Sodium, Ur: 60 mmol/L

## 2019-01-12 MED ORDER — PANTOPRAZOLE SODIUM 40 MG PO TBEC
40.0000 mg | DELAYED_RELEASE_TABLET | Freq: Two times a day (BID) | ORAL | Status: DC
  Administered 2019-01-12 – 2019-01-15 (×6): 40 mg via ORAL
  Filled 2019-01-12 (×9): qty 1

## 2019-01-12 MED ORDER — PREGABALIN 75 MG PO CAPS
75.0000 mg | ORAL_CAPSULE | Freq: Three times a day (TID) | ORAL | Status: DC
  Administered 2019-01-12 – 2019-01-13 (×3): 75 mg via ORAL
  Filled 2019-01-12 (×3): qty 1

## 2019-01-12 MED ORDER — VANCOMYCIN HCL IN DEXTROSE 1-5 GM/200ML-% IV SOLN
1000.0000 mg | INTRAVENOUS | Status: DC
  Administered 2019-01-14: 1000 mg via INTRAVENOUS
  Filled 2019-01-12: qty 200

## 2019-01-12 MED ORDER — STERILE WATER FOR INJECTION IV SOLN
INTRAVENOUS | Status: DC
  Administered 2019-01-12 – 2019-01-14 (×3): via INTRAVENOUS
  Filled 2019-01-12 (×5): qty 850

## 2019-01-12 MED ORDER — TIZANIDINE HCL 4 MG PO TABS
6.0000 mg | ORAL_TABLET | Freq: Two times a day (BID) | ORAL | Status: DC | PRN
  Filled 2019-01-12: qty 1

## 2019-01-12 MED ORDER — DULOXETINE HCL 60 MG PO CPEP
60.0000 mg | ORAL_CAPSULE | Freq: Two times a day (BID) | ORAL | Status: DC
  Administered 2019-01-12 – 2019-01-24 (×17): 60 mg via ORAL
  Filled 2019-01-12 (×20): qty 1

## 2019-01-12 MED ORDER — LIDOCAINE 5 % EX PTCH
1.0000 | MEDICATED_PATCH | CUTANEOUS | Status: DC
  Administered 2019-01-12 – 2019-01-23 (×8): 1 via TRANSDERMAL
  Filled 2019-01-12 (×9): qty 1

## 2019-01-12 MED ORDER — LEVOTHYROXINE SODIUM 25 MCG PO TABS
25.0000 ug | ORAL_TABLET | Freq: Every day | ORAL | Status: DC
  Administered 2019-01-13 – 2019-01-24 (×10): 25 ug via ORAL
  Filled 2019-01-12 (×10): qty 1

## 2019-01-12 MED ORDER — LEVOTHYROXINE SODIUM 25 MCG PO TABS: 25.0000 ug | ORAL_TABLET | Freq: Every day | ORAL | Status: DC

## 2019-01-12 MED ORDER — HYDROCODONE-ACETAMINOPHEN 5-325 MG PO TABS
1.0000 | ORAL_TABLET | Freq: Three times a day (TID) | ORAL | Status: DC | PRN
  Administered 2019-01-12 – 2019-01-23 (×6): 1 via ORAL
  Filled 2019-01-12 (×8): qty 1

## 2019-01-12 MED ORDER — TRAZODONE HCL 50 MG PO TABS
50.0000 mg | ORAL_TABLET | Freq: Every day | ORAL | Status: DC
  Administered 2019-01-12 – 2019-01-14 (×3): 50 mg via ORAL
  Filled 2019-01-12 (×3): qty 1

## 2019-01-12 MED ORDER — VANCOMYCIN HCL 10 G IV SOLR
1500.0000 mg | INTRAVENOUS | Status: AC
  Administered 2019-01-12: 1500 mg via INTRAVENOUS
  Filled 2019-01-12: qty 1500

## 2019-01-12 NOTE — Progress Notes (Signed)
Pharmacy Note  Random vanc level <4; appears that pt never received initial dose of vancomycin.  Will give vancomycin 1500mg  IV x1 now.  SCr still 4.  Wynona Neat, PharmD, BCPS 01/12/2019 4:27 AM

## 2019-01-12 NOTE — Progress Notes (Addendum)
Patient ID: Carolyn Zamora, female   DOB: 04-16-1955, 63 y.o.   MRN: [id number] S: Feeling a little better but still with severe bilateral foot pain due to "gout" O:BP 132/81 (BP Location: Right Arm)   Pulse (!) 108   Temp (!) 97.4 F (36.3 C) (Oral)   Resp 18   Ht 5\' 2"  (1.575 m)   Wt 75.8 kg   SpO2 100%   BMI 30.54 kg/m   Intake/Output Summary (Last 24 hours) at 01/12/2019 1403 Last data filed at 01/12/2019 0600 Gross per 24 hour  Intake 941.64 ml  Output -  Net 941.64 ml   Intake/Output: I/O last 3 completed shifts: In: 1123.3 [P.O.:440; I.V.:296.4; IV Piggyback:386.9] Out: 60 [Urine:60]  Intake/Output this shift:  No intake/output data recorded. Weight change:  Gen: NAD CVS: no rub Resp: cta Abd: benign Ext: no edema  Recent Labs  Lab 01/10/19 1549 01/11/19 1648 01/12/19 0207  NA 141 140 138  K 4.1 4.6 4.9  CL 113* 113* 111  CO2 18* 14* 13*  GLUCOSE 75 76 90  BUN 51* 45* 44*  CREATININE 4.42* 4.09* 4.20*  ALBUMIN 3.0* 2.4*  --   CALCIUM 7.6* 7.7* 8.3*  AST 17 13*  --   ALT 20 17  --    Liver Function Tests: Recent Labs  Lab 01/10/19 1549 01/11/19 1648  AST 17 13*  ALT 20 17  ALKPHOS 91 99  BILITOT 0.8 0.7  PROT 5.9* 5.5*  ALBUMIN 3.0* 2.4*   Recent Labs  Lab 01/10/19 1549  LIPASE 16   No results for input(s): AMMONIA in the last 168 hours. CBC: Recent Labs  Lab 01/10/19 1549 01/11/19 1648 01/12/19 0207  WBC 20.3* 28.5* 36.6*  NEUTROABS 15.9* 27.1*  --   HGB 8.9* 8.7* 9.4*  HCT 29.1* 28.0* 30.3*  MCV 73.1* 73.5* 73.7*  PLT 199 206 233   Cardiac Enzymes: Recent Labs  Lab 01/10/19 1549  CKTOTAL 42   CBG: No results for input(s): GLUCAP in the last 168 hours.  Iron Studies:  Recent Labs    01/12/19 1030  IRON 14*  TIBC 186*  FERRITIN 371*   Studies/Results: Ct Abdomen Pelvis Wo Contrast  Result Date: 01/12/2019 CLINICAL DATA:  Hydronephrosis, history of CKD EXAM: CT ABDOMEN AND PELVIS WITHOUT CONTRAST TECHNIQUE:  Multidetector CT imaging of the abdomen and pelvis was performed following the standard protocol without IV contrast. COMPARISON:  Renal ultrasound January 11, 2019 CT October 22, 2018 FINDINGS: Lower chest: The visualized heart size within normal limits. No pericardial fluid/thickening. No hiatal hernia. There is patchy area of posterior consolidation seen at both lung bases. Hepatobiliary: Although limited due to the lack of intravenous contrast, normal in appearance without gross focal abnormality. Mildly dilated fluid-filled gallbladder. No calcified gallstones. Pancreas:  Unremarkable.  No surrounding inflammatory changes. Spleen: A small amount of splenic tissue seen within the left upper quadrant. Adrenals/Urinary Tract: Both adrenal glands appear normal. Again noted is a left-sided adrenal nephrectomy. There is mild right-sided perinephric stranding and proximal periureteral stranding. No right-sided renal, collecting system, or bladder calculi however are noted. Stomach/Bowel: The stomach, small bowel, and colon are normal in appearance. No inflammatory changes or obstructive findings. Vascular/Lymphatic: There are no enlarged abdominal or pelvic lymph nodes. Scattered aortic atherosclerotic calcifications are seen without aneurysmal dilatation. Reproductive: Again noted is a partially exophytic right posterior fibroid with coarse calcifications. Other: No evidence of abdominal wall mass or hernia. Musculoskeletal: No acute or significant osseous findings. Again noted is  a grade 2 anterolisthesis of L4 on L5 with endplate irregularity and collapse of the anterior superior endplate of L5, and also fragmentation of the posteroinferior L4 vertebral body. This is seen dating back to MRI of 2019. 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 IMPRESSION: 1. Patchy airspace opacity seen at both lung bases which is non-specific could be related to early infectious etiology and/or atelectasis. 2. Mild  inflammatory changes surrounding the right kidney which could be due to pyelonephritis. No right-sided renal or collecting system calculi. 3.  Aortic Atherosclerosis (ICD10-I70.0). Electronically Signed   By: Prudencio Pair M.D.   On: 01/12/2019 02:14   US Renal  Result Date: 01/11/2019 CLINICAL DATA:  63 year old female with worsening renal function. Prior left nephrectomy. EXAM: RENAL / URINARY TRACT ULTRASOUND COMPLETE COMPARISON:  CT abdomen pelvis dated 01/25/2018 and renal ultrasound dated 12/16/2017. FINDINGS: Right Kidney: Renal measurements: 11.1 x 5.6 x 5.8 cm = volume: 191 mL. Mild increased renal echogenicity. No hydronephrosis or shadowing stone. There is a 1.6 x 1.7 x 1.8 cm hypoechoic lesion in the upper pole of the right kidney which is suboptimally characterized, possibly a cyst. Left Kidney: Prior nephrectomy. Bladder: Appears normal for degree of bladder distention. Other: None. IMPRESSION: Mild increased renal echogenicity. No hydronephrosis or shadowing stone. Electronically Signed   By: Anner Crete M.D.   On: 01/11/2019 22:48   Dg Chest Port 1 View  Result Date: 01/10/2019 CLINICAL DATA:  Body aches and fevers. EXAM: PORTABLE CHEST 1 VIEW COMPARISON:  July 31, 2017 FINDINGS: The heart size is enlarged. There is no pneumothorax. No large pleural effusion. There is likely atelectasis at the lung bases. Aortic calcifications are noted. IMPRESSION: 1. Cardiomegaly without edema or pleural effusion. 2. Bibasilar atelectasis. 3. Aortic atherosclerosis. Electronically Signed   By: Constance Holster M.D.   On: 01/10/2019 16:52   Dg Foot Complete Right  Result Date: 01/10/2019 CLINICAL DATA:  Foot pain pain EXAM: RIGHT FOOT COMPLETE - 3+ VIEW COMPARISON:  11/16/2015. FINDINGS: There are advanced degenerative changes of the first metatarsophalangeal joint. There is osteopenia. There is no acute displaced fracture. No dislocation. The patient is status post prior ORIF of the ankle.  Vascular calcifications are noted. IMPRESSION: No acute osseous abnormality. Electronically Signed   By: Constance Holster M.D.   On: 01/10/2019 15:09   Vas [place] Lower Extremity Venous (dvt)  Result Date: 01/12/2019  Lower Venous Study Indications: Swelling, and Edema.  Limitations: Patient unable to tolerate compression. Comparison Study: no prior Performing Technologist: Abram Sander RVS  Examination Guidelines: A complete evaluation includes B-mode imaging, spectral Doppler, color Doppler, and power Doppler as needed of all accessible portions of each vessel. Bilateral testing is considered an integral part of a complete examination. Limited examinations for reoccurring indications may be performed as noted.  +---------+---------------+---------+-----------+----------+--------------+ RIGHT    CompressibilityPhasicitySpontaneityPropertiesThrombus Aging +---------+---------------+---------+-----------+----------+--------------+ CFV      Full           Yes      Yes                                 +---------+---------------+---------+-----------+----------+--------------+ SFJ      Full                                                        +---------+---------------+---------+-----------+----------+--------------+  FV Prox  Full                                                        +---------+---------------+---------+-----------+----------+--------------+ FV Mid   Full                                                        +---------+---------------+---------+-----------+----------+--------------+ FV Distal               Yes      Yes                                 +---------+---------------+---------+-----------+----------+--------------+ PFV      Full                                                        +---------+---------------+---------+-----------+----------+--------------+ POP      Full           Yes      Yes                                  +---------+---------------+---------+-----------+----------+--------------+ PTV      Full                                                        +---------+---------------+---------+-----------+----------+--------------+ PERO     Full                                                        +---------+---------------+---------+-----------+----------+--------------+   +---------+---------------+---------+-----------+----------+-------------------+ LEFT     CompressibilityPhasicitySpontaneityPropertiesThrombus Aging      +---------+---------------+---------+-----------+----------+-------------------+ CFV      Full           Yes      Yes                                      +---------+---------------+---------+-----------+----------+-------------------+ SFJ      Full                                                             +---------+---------------+---------+-----------+----------+-------------------+ FV Prox  Full                                                             +---------+---------------+---------+-----------+----------+-------------------+  FV Mid                  Yes      Yes                  unable to compress                                                        due to pain                                                               tolerance           +---------+---------------+---------+-----------+----------+-------------------+ FV Distal               Yes      Yes                  unable to compress                                                        due to pain                                                               tolerance           +---------+---------------+---------+-----------+----------+-------------------+ PFV      Full                                                             +---------+---------------+---------+-----------+----------+-------------------+ POP                      Yes      Yes                  unable to compress                                                        due to pain                                                               tolerance           +---------+---------------+---------+-----------+----------+-------------------+  PTV                                                   unable to compress                                                        due to pain                                                               tolerance           +---------+---------------+---------+-----------+----------+-------------------+ PERO                                                  unable to compress                                                        due to pain                                                               tolerance           +---------+---------------+---------+-----------+----------+-------------------+    Summary: Right: There is no evidence of deep vein thrombosis in the lower extremity. No cystic structure found in the popliteal fossa. Left: There is no evidence of deep vein thrombosis in the lower extremity. However, portions of this examination were limited- see technologist comments above. No cystic structure found in the popliteal fossa.  *See table(s) above for measurements and observations.    Preliminary    . heparin  5,000 Units Subcutaneous Q8H  . hydrocortisone sod succinate (SOLU-CORTEF) inj  100 mg Intravenous Q8H  . hydroxychloroquine  200 mg Oral BID  . vancomycin variable dose per unstable renal function (pharmacist dosing)   Does not apply See admin instructions    BMET    Component Value Date/Time   NA 138 01/12/2019 0207   K 4.9 01/12/2019 0207   CL 111 01/12/2019 0207   CO2 13 (L) 01/12/2019 0207   GLUCOSE 90 01/12/2019 0207   BUN 44 (H) 01/12/2019 0207   CREATININE 4.20 (H) 01/12/2019 0207   CALCIUM 8.3 (L) 01/12/2019 0207   GFRNONAA 11 (L)  01/12/2019 0207   GFRAA 12 (L) 01/12/2019 0207   CBC    Component Value Date/Time   WBC 36.6 (H) 01/12/2019 0207   RBC 4.11 01/12/2019 0207   HGB 9.4 (L) 01/12/2019 0207   HCT 30.3 (L)  01/12/2019 0207   PLT 233 01/12/2019 0207   MCV 73.7 (L) 01/12/2019 0207   MCH 22.9 (L) 01/12/2019 0207   MCHC 31.0 01/12/2019 0207   RDW 15.6 (H) 01/12/2019 0207   LYMPHSABS 1.1 01/11/2019 1648   MONOABS 0.3 01/11/2019 1648   EOSABS 0.0 01/11/2019 1648   BASOSABS 0.0 01/11/2019 1648     Assessment/Plan:  1. AKI/CKD stage 4- presumably due to ischemic ATN in setting of sepsis and pyelonephritis with solitary functioning kidney.  Doubt lupus flare.  Continue with abx and gentle IVF's and follow renal function.  No indication for HD at this time. 1. CT scan of abd/pelvis with perinephric stranding of solitary right kidney 2. Complements pending. 2. Severe sepsis- with tachycardia, leukocytosis, cultures pending, Urine culture not sent for unclear reasons.  Will repeat. 3. Metabolic acidosis- due to #1.  Will start isotonic bicarb and follow.  4. Acute right foot pain- presumably due to gout flare.  On stress dose steroids 5. SLE- on plaquenil and prednisone as outpatient, as well as cellcept 500 mg bid.  cellcept on hold due to infection.  6. H/o warm hemolytic anemia- t. Bili normal, LDH elevated. 7. H/o ITP platelets stable and WNL  Donetta Potts, MD [facility] [phone]

## 2019-01-12 NOTE — Evaluation (Signed)
Physical Therapy Evaluation Patient Details Name: Carolyn Zamora MRN: [id number] DOB: [birth year] Today's Date: 01/12/2019   History of Present Illness  Pt is a 63 y/o F admitted on 01/10/19 for feet pain x 5 days & worsening renal function. Pt found to have sepsis of unknown etiology. PMH significant for renal cell carcinoma s/p L nephrectomy, splenectomy, CKD stage 3, chronic pain syndrome, morbid obesity, lupus on chronic immunosuppressive therapy, HTN, anemia.  Clinical Impression  Pt very limited by BLE gout pain in feet, only participating in repositioning in bed & requiring total assist to do so. Pt will require ongoing PT services to focus on bed mobility, transfers, & gait as pt is able. Pt will require 24 hr supervision/assist at d/c 2/2 physical limitations.     Follow Up Recommendations SNF;Supervision/Assistance - 24 hour    Equipment Recommendations  (TBD)    Recommendations for Other Services       Precautions / Restrictions Precautions Precautions: Fall Restrictions Weight Bearing Restrictions: No      Mobility  Bed Mobility Overal bed mobility: Needs Assistance Bed Mobility: (scooting to [facility])           General bed mobility comments: pt unwilling to transfer to EOB but attempts to scoot to [facility], pt only able to use RUE and unable to push with BLE so pt requires total assist & bed in trendelenburg position to scoot to [facility]  Transfers                    Ambulation/Gait                Stairs            Wheelchair Mobility    Modified Rankin (Stroke Patients Only)       Balance                                             Pertinent Vitals/Pain Pain Assessment: 0-10 Pain Score: 8  Pain Location: BLE gout pain in feet Pain Intervention(s): Monitored during session;Limited activity within patient's tolerance    Home Living Family/patient expects to be discharged to:: Private residence Living Arrangements:  Alone Available Help at Discharge: Available 24 hours/day;Family Type of Home: (condo) Home Access: Level entry     Home Layout: One level Home Equipment: Environmental consultant - 4 wheels      Prior Function Level of Independence: Independent with assistive device(s)         Comments: pt uses rollator in the home PRN 2/2 gout pain     Hand Dominance        Extremity/Trunk Assessment        Lower Extremity Assessment Lower Extremity Assessment: (pt unable to tolerate touch to BLE feet 2/2 gout pain, pt unable to actively move ankles 2/2 pain, some knee flexion activation noted, pt requires total assist to lift BLE off of bed to allow therapist to place 2nd pillow under BLE)       Communication   Communication: No difficulties  Cognition Arousal/Alertness: Awake/alert Behavior During Therapy: WFL for tasks assessed/performed Overall Cognitive Status: (Will assess further in functional context)                                        General Comments  General comments (skin integrity, edema, etc.): Pt limited by significant gout pain in BLE, only willing to attempt scooting to [facility] despite encouragement/education    Exercises     Assessment/Plan    PT Assessment Patient needs continued PT services  PT Problem List Decreased strength;Decreased balance;Decreased cognition;Decreased knowledge of precautions;Pain;Cardiopulmonary status limiting activity;Decreased knowledge of use of DME;Decreased mobility;Decreased range of motion;Decreased activity tolerance;Decreased coordination;Decreased safety awareness;Impaired sensation       PT Treatment Interventions DME instruction;Functional mobility training;Patient/family education;Balance training;Modalities;Wheelchair mobility training;Neuromuscular re-education;Therapeutic activities;Gait training;Stair training;Therapeutic exercise;Manual techniques;Cognitive remediation    PT Goals (Current goals can be found in the  Care Plan section)  Acute Rehab PT Goals Patient Stated Goal: decreased pain PT Goal Formulation: With patient Time For Goal Achievement: 01/26/19 Potential to Achieve Goals: Fair    Frequency Min 3X/week   Barriers to discharge Decreased caregiver support unsure if pt has 24 hr supervision at d/c    Co-evaluation               AM-PAC PT "6 Clicks" Mobility  Outcome Measure Help needed turning from your back to your side while in a flat bed without using bedrails?: Total Help needed moving from lying on your back to sitting on the side of a flat bed without using bedrails?: Total Help needed moving to and from a bed to a chair (including a wheelchair)?: Total Help needed standing up from a chair using your arms (e.g., wheelchair or bedside chair)?: Total Help needed to walk in hospital room?: Total Help needed climbing 3-5 steps with a railing? : Total 6 Click Score: 6    End of Session   Activity Tolerance: Patient limited by pain Patient left: in bed;with bed alarm set;with call bell/phone within reach   PT Visit Diagnosis: Other abnormalities of gait and mobility (R26.89);Muscle weakness (generalized) (M62.81)    Time: 9417-4081 PT Time Calculation (min) (ACUTE ONLY): 20 min   Charges:   PT Evaluation $PT Eval Moderate Complexity: [place], PT, DPT 01/12/2019, 1:11 PM

## 2019-01-12 NOTE — Progress Notes (Signed)
Lower extremity venous has been completed.   Preliminary results in CV Proc.   Abram Sander 01/12/2019 8:39 AM

## 2019-01-12 NOTE — Progress Notes (Signed)
PROGRESS NOTE    Carolyn Zamora  JAS:[id number] DOB: 12-12-1955 DOA: 01/10/2019 PCP: Beckie Salts, MD   Brief Narrative:  HPI per Dr. Early Osmond on 01/11/2019 Carolyn Zamora is a 63 y.o. female with medical history significant of renal cell carcinoma status post left nephrectomy, history of splenectomy, CKD stage III, chronic pain syndrome, morbid obesity, lupus-on chronic immunosuppressive therapy (mycophenolate, hydroxychloroquine, prednisone), hypertension, anemia of chronic disease came from [place] for the concern of sepsis secondary to UTI and worsening kidney function.  Patient tells me that she has feet pain which is severe in intensity since 5 days.  Unable to walk/move, could not sleep at night, no aggravating or relieving factors, associated with chills denies association with trauma, fever, decreased appetite, generalized weakness or lethargy.  Had history of gout however currently not on any medicines.  X-ray of right foot obtained at [facility]-which came back negative.  She reports lower abdominal pain and chronic back pain however denies urinary symptoms such as dysuria, hematuria, foul-smelling urine, nocturia, nausea, vomiting.  She lives alone at home and her daughter and sister helps her.  No history of smoking, alcohol, illicit drug use.  At [facility]: Patient was tachycardic, tachypneic, leukocytosis of 20.3, UA positive for small leukocytes.  CK, lipase level: WNL, COVID-19 negative, magnesium: Low-- was replaced.  CMP shows worsening kidney function.  Patient received IV fluids and Rocephin for the concern of urosepsis.  Patient transferred to [facility] for further evaluation and management.  **Interim History Nephrology does not feel that she has a lupus flare but her stress dosing her steroids and checking complement level and serological work-up.  WBC elevated likely in the setting of her steroids.  Patient complaining of foot pain still.   No evidence of DVT on lower extremity Dopplers.  Assessment & Plan:   Principal Problem:   Sepsis ([place]) Active Problems:   Hypertension   Systemic lupus erythematosus (HCC)   Chronic pain syndrome   Renal cell carcinoma of left kidney s/p nephrectomy   Anemia of chronic disease   Renal failure (ARF), acute on chronic (HCC)   Pain in both feet   Hypomagnesemia  Sepsis of Unknown Etiology but likely in the setting of Pyelonephritis and Complicated by Gout Placement  -Denies any urinary symptoms.  UA is positive only for trace leukocytes. -CT Abd/Pelvis showed "Patchy airspace opacity seen at both lung bases which is non-specific could be related to early infectious etiology and/or atelectasis. Mild inflammatory changes surrounding the right kidney which could be due to pyelonephritis. No right-sided renal or collecting system calculi. Aortic Atherosclerosis." -Patient is afebrile, tachycardic, tachypneic, leukocytosis of 20.3-->Trended up to 28.5 yesterday and worsened to 36.6 Today but ? Related to Steroids -Both chest x-ray but CT did show patchy airspace opacites  -Right foot x-ray: Negative.   -COVID-19 negative, lipase: WNL.  -Patient does not have chronic leukocytosis although she is on prednisone at home.   -Check Lactic Acid and blood culture and procalcitonin level. -Patient has feet pain, worsening kidney function along with anemia-multiple myeloma could be consideration?. -Patient is obese with limited mobility due to feet pain with history of lupus-she is on intermediate risk for PE however could not get CT angiogram due to worsening kidney function and D-dimer will be non specific due to worsening kidney function. -We will get Doppler ultrasound of bilateral lower extremity to rule out DVT and showed no evidence of DVT but was limited  -Started patient on broad-spectrum antibiotics IV  Vanco and cefepime now.  Follow blood cultures and showed NGTD <24 Hours.  Bilateral Feet  Pain -Unknown etiology?,  Had a history of gout in the past. -We will check uric acid, CRP, sed rate; LDH was 238, Uric Acid was 11.5, and ESR was <4 -Avoid NSAIDs; May need a Larger Prednisone Taper but will need to address Infection first and she is on Stress Dose Steroids with Hydrocortisone 100 mg IV q8h  -Dilaudid as needed for pain control -May need Colchicine  -Resume Pain Meds as below   AKI on CKD stage IV  Metabolic Acidosis -Likely secondary from underlying infection and nephrology feels presumably due to ischemic ATN in setting of sepsis and pyelonephritis with a solitary functioning kidney do not feel that this is consistent with a lupus flare -Patient is BUN/creatinine went from 51/4.42 -> 45/4.09 -> 44/4.20 -Patient's CO2 was 13, anion gap was 14, and chloride levels 111 -Nephrology started the patient on sodium bicarbonate infusion with 150 mEq at 75 mL's per hour -Patient has history of left nephrectomy due to renal cell carcinoma. -Potassium: WNL and was 4.9.   -Renal ultrasound showed "Mild increased renal echogenicity. No hydronephrosis or shadowing stone."  -Avoid nephrotoxic medications as well as contrast dyes and patient is currently having her furosemide held [facility] nephrology for further evaluation and management.   -Avoid nephrotoxic medication and monitor kidney function closely. -Repeat CMP in AM   Microcytic Anemia/Anemia of Chronic Kidney Disease  -Has a history of warm hemolytic anemia but her T bili is normal and LDH is elevated above normal at 238 -Patient's Hb/Hct went from 8.9/29.1 -> 8.7/20.0 -> 9.4/30.3 -T Bili yesterday was 0.7 -Checked Anemia Panel and showed an iron level of 14, U IBC of 172, TIBC of 186, saturation ratios of 8%, ferritin level 371 -No signs of active bleeding, monitor H&H closely. -We will check iron studies.  Hypomagnesemia -Replaced and improved and Mag Level went from 1.4 -> 2.2 -Continue to Monitor and Replete as  Necessary -Repeat Mag Level in AM   Lupus -Mycophenolate 500 mg po BID held by Dr. Hollie Salk -Prednisone 5 mg po Daily held for Stress Dose Steroids of -C/w Hydroxychloroquine 200 mg po BID -Checking Complement Serologies as well ANA/dsDNA to asses for Lupus Flare -Dr. Hollie Salk also checking UP/C but has "Dirty U/A"  Hypertension  -On Amlodipine at home but this was held due to Sepsis  -Monitor her blood pressure closely. -Last BP was 132/81  Depression/Anxiety -C/w Trazodone 50 mg po Daily and with Duloxetine 60 mg po BID   GERD -C/w Pantoprazole 40 mg pO BID   Chronic Pain Syndrome -On Hydrocodone-Acetaminophen 1 tab po q8hprn Moderate pain, Duloxetine 60 mg po BID, Pregabalin 75 mg po TID -Also have Hydromorphone 1 mg IV q4hprn Severe Pain  -Will also resume Tizanidine 6 mg po BIDprn Muscle Spasms  Obesity -Estimated body mass index is 30.54 kg/m as calculated from the following:   Height as of this encounter: '5\' 2"'  (1.575 m).   Weight as of this encounter: 75.8 kg. -Weight Loss and Dietary Counseling given   DVT prophylaxis: Heparin 5,000 units sq q8h Code Status: FULL CODE  Family Communication: No family present at bedside  Disposition Plan: Pending further improvement back to baseline and evaluation by PT/OT  Consultants:   Nephrology  Procedures: None   Antimicrobials:  Anti-infectives (From admission, onward)   Start     Dose/Rate Route Frequency Ordered Stop   01/14/19 0600  vancomycin (VANCOCIN) IVPB  1000 mg/200 mL premix     1,000 mg 200 mL/hr over 60 Minutes Intravenous Every 48 hours 01/12/19 1041     01/12/19 0445  vancomycin (VANCOCIN) 1,500 mg in sodium chloride 0.9 % 500 mL IVPB     1,500 mg 250 mL/hr over 120 Minutes Intravenous NOW 01/12/19 0426 01/12/19 0750   01/11/19 2200  hydroxychloroquine (PLAQUENIL) tablet 200 mg     200 mg Oral 2 times daily 01/11/19 2105     01/11/19 1830  vancomycin (VANCOCIN) 1,500 mg in sodium chloride 0.9 % 500 mL  IVPB  Status:  Discontinued     1,500 mg 250 mL/hr over 120 Minutes Intravenous  Once 01/11/19 1817 01/12/19 0426   01/11/19 1830  ceFEPIme (MAXIPIME) 2 g in sodium chloride 0.9 % 100 mL IVPB     2 g 200 mL/hr over 30 Minutes Intravenous Every 24 hours 01/11/19 1817     01/11/19 1818  vancomycin variable dose per unstable renal function (pharmacist dosing)      Does not apply See admin instructions 01/11/19 1818     01/10/19 1845  cefTRIAXone (ROCEPHIN) 1 g in sodium chloride 0.9 % 100 mL IVPB     1 g 200 mL/hr over 30 Minutes Intravenous  Once 01/10/19 1835 01/10/19 2004     Subjective: Seen and examined at bedside and she is complaining of significant lower extremity pain.  No nausea or vomiting.  Denies lightheadedness or dizziness.  No other concerns or complaints at this time except that she has significant foot pain and does not her feet to be touched.  Objective: Vitals:   01/11/19 1600 01/11/19 2159 01/12/19 0200 01/12/19 0458  BP: (!) 109/59 112/87  132/81  Pulse: (!) 118 (!) 119  (!) 108  Resp: 19 20 (!) 21 18  Temp: 98.3 F (36.8 C) 98.7 F (37.1 C)  (!) 97.4 F (36.3 C)  TempSrc: Oral Oral  Oral  SpO2: 100% 100%  100%  Weight:      Height:        Intake/Output Summary (Last 24 hours) at 01/12/2019 1606 Last data filed at 01/12/2019 1508 Gross per 24 hour  Intake 821.64 ml  Output 900 ml  Net -78.36 ml   Filed Weights   01/10/19 1144  Weight: 75.8 kg   Examination: Physical Exam:  Constitutional: WN/WD obese African-American female currently NAD appears slightly uncomfortable and anxious  Eyes: Lids and conjunctivae normal, sclerae anicteric  ENMT: External Ears, Nose appear normal. Grossly normal hearing.  Neck: Appears normal, supple, no cervical masses, normal ROM, no appreciable thyromegaly heart: No JVD Respiratory: Diminished to auscultation bilaterally, no wheezing, rales, rhonchi or crackles. Normal respiratory effort and patient is not tachypenic.  No accessory muscle use.  Cardiovascular: Tachycardic rate but regular rhythm, no murmurs / rubs / gallops. S1 and S2 auscultated.  Trace extremity edema Abdomen: Soft, non-tender, Distended due to body habitus. Bowel sounds positive x4.  GU: Deferred. Musculoskeletal: No clubbing / cyanosis of digits/nails. No joint deformity upper and lower extremities but she would not let me palpate her feet significantly diminished her pain Skin: No rashes, lesions, ulcers on limited skin evaluation. No induration; Warm and dry.  Neurologic: CN 2-12 grossly intact with no focal deficits. Romberg sign and cerebellar reflexes not assessed.  Psychiatric: Normal judgment and insight. Alert and oriented x 3. Anxious mood and appropriate affect.   Data Reviewed: I have personally reviewed following labs and imaging studies  CBC: Recent Labs  Lab  01/10/19 1549 01/11/19 1648 01/12/19 0207  WBC 20.3* 28.5* 36.6*  NEUTROABS 15.9* 27.1*  --   HGB 8.9* 8.7* 9.4*  HCT 29.1* 28.0* 30.3*  MCV 73.1* 73.5* 73.7*  PLT 199 206 751   Basic Metabolic Panel: Recent Labs  Lab 01/10/19 1549 01/11/19 0631 01/11/19 1648 01/12/19 0207  NA 141  --  140 138  K 4.1  --  4.6 4.9  CL 113*  --  113* 111  CO2 18*  --  14* 13*  GLUCOSE 75  --  76 90  BUN 51*  --  45* 44*  CREATININE 4.42*  --  4.09* 4.20*  CALCIUM 7.6*  --  7.7* 8.3*  MG 1.2* 1.4*  --  2.2   GFR: Estimated Creatinine Clearance: 13.2 mL/min (A) (by C-G formula based on SCr of 4.2 mg/dL (H)). Liver Function Tests: Recent Labs  Lab 01/10/19 1549 01/11/19 1648  AST 17 13*  ALT 20 17  ALKPHOS 91 99  BILITOT 0.8 0.7  PROT 5.9* 5.5*  ALBUMIN 3.0* 2.4*   Recent Labs  Lab 01/10/19 1549  LIPASE 16   No results for input(s): AMMONIA in the last 168 hours. Coagulation Profile: No results for input(s): INR, PROTIME in the last 168 hours. Cardiac Enzymes: Recent Labs  Lab 01/10/19 1549  CKTOTAL 42   BNP (last 3 results) No results for  input(s): PROBNP in the last 8760 hours. HbA1C: No results for input(s): HGBA1C in the last 72 hours. CBG: No results for input(s): GLUCAP in the last 168 hours. Lipid Profile: No results for input(s): CHOL, HDL, LDLCALC, TRIG, CHOLHDL, LDLDIRECT in the last 72 hours. Thyroid Function Tests: No results for input(s): TSH, T4TOTAL, FREET4, T3FREE, THYROIDAB in the last 72 hours. Anemia Panel: Recent Labs    01/12/19 1030  FERRITIN 371*  TIBC 186*  IRON 14*   Sepsis Labs: Recent Labs  Lab 01/11/19 1802 01/11/19 1807 01/11/19 2004  PROCALCITON 3.53  --   --   LATICACIDVEN  --  0.9 1.1    Recent Results (from the past 240 hour(s))  SARS Coronavirus 2 Ag (30 min TAT) - Nasal Swab (BD Veritor Kit)     Status: None   Collection Time: 01/10/19  3:49 PM   Specimen: Nasal Swab (BD Veritor Kit)  Result Value Ref Range Status   SARS Coronavirus 2 Ag NEGATIVE NEGATIVE Final    Comment: (NOTE) SARS-CoV-2 antigen NOT DETECTED.  Negative results are presumptive.  Negative results do not preclude SARS-CoV-2 infection and should not be used as the sole basis for treatment or other patient management decisions, including infection  control decisions, particularly in the presence of clinical signs and  symptoms consistent with COVID-19, or in those who have been in contact with the virus.  Negative results must be combined with clinical observations, patient history, and epidemiological information. The expected result is Negative. Fact Sheet for Patients: PodPark.tn Fact Sheet for Healthcare Providers: GiftContent.is This test is not yet approved or cleared by the [place] FDA and  has been authorized for detection and/or diagnosis of SARS-CoV-2 by FDA under an Emergency Use Authorization (EUA).  This EUA will remain in effect (meaning this test can be used) for the duration of  the COVID-19 de claration under Section  564(b)(1) of the Act, 21 U.S.C. section 360bbb-3(b)(1), unless the authorization is terminated or revoked sooner. Performed at [facility], [address] [place], [place] [address]   SARS CORONAVIRUS 2 (TAT  6-24 HRS) Nasopharyngeal Urine, Catheterized     Status: None   Collection Time: 01/10/19  6:03 PM   Specimen: Urine, Catheterized; Nasopharyngeal  Result Value Ref Range Status   SARS Coronavirus 2 NEGATIVE NEGATIVE Final    Comment: (NOTE) SARS-CoV-2 target nucleic acids are NOT DETECTED. The SARS-CoV-2 RNA is generally detectable in upper and lower respiratory specimens during the acute phase of infection. Negative results do not preclude SARS-CoV-2 infection, do not rule out co-infections with other pathogens, and should not be used as the sole basis for treatment or other patient management decisions. Negative results must be combined with clinical observations, patient history, and epidemiological information. The expected result is Negative. Fact Sheet for Patients: SugarRoll.be Fact Sheet for Healthcare Providers: https://www.woods-mathews.com/ This test is not yet approved or cleared by the [place] FDA and  has been authorized for detection and/or diagnosis of SARS-CoV-2 by FDA under an Emergency Use Authorization (EUA). This EUA will remain  in effect (meaning this test can be used) for the duration of the COVID-19 declaration under Section 56 4(b)(1) of the Act, 21 U.S.C. section 360bbb-3(b)(1), unless the authorization is terminated or revoked sooner. Performed at [facility], [place] [address], [place], [place] [address]   Culture, blood (routine x 2)     Status: None (Preliminary result)   Collection Time: 01/11/19  6:02 PM   Specimen: BLOOD RIGHT HAND  Result Value Ref Range Status   Specimen Description BLOOD RIGHT HAND  Final   Special Requests   Final    BOTTLES DRAWN AEROBIC ONLY Blood  Culture results may not be optimal due to an inadequate volume of blood received in culture bottles   Culture   Final    NO GROWTH < 24 HOURS Performed at [facility], [place] [address] [place], [place] [address]    Report Status PENDING  Incomplete  Culture, blood (routine x 2)     Status: None (Preliminary result)   Collection Time: 01/11/19  6:07 PM   Specimen: BLOOD LEFT HAND  Result Value Ref Range Status   Specimen Description BLOOD LEFT HAND  Final   Special Requests   Final    BOTTLES DRAWN AEROBIC ONLY Blood Culture results may not be optimal due to an inadequate volume of blood received in culture bottles   Culture   Final    NO GROWTH < 24 HOURS Performed at [facility], [place] [address] [place], [place] [address]    Report Status PENDING  Incomplete    Radiology Studies: Ct Abdomen Pelvis Wo Contrast  Result Date: 01/12/2019 CLINICAL DATA:  Hydronephrosis, history of CKD EXAM: CT ABDOMEN AND PELVIS WITHOUT CONTRAST TECHNIQUE: Multidetector CT imaging of the abdomen and pelvis was performed following the standard protocol without IV contrast. COMPARISON:  Renal ultrasound January 11, 2019 CT October 22, 2018 FINDINGS: Lower chest: The visualized heart size within normal limits. No pericardial fluid/thickening. No hiatal hernia. There is patchy area of posterior consolidation seen at both lung bases. Hepatobiliary: Although limited due to the lack of intravenous contrast, normal in appearance without gross focal abnormality. Mildly dilated fluid-filled gallbladder. No calcified gallstones. Pancreas:  Unremarkable.  No surrounding inflammatory changes. Spleen: A small amount of splenic tissue seen within the left upper quadrant. Adrenals/Urinary Tract: Both adrenal glands appear normal. Again noted is a left-sided adrenal nephrectomy. There is mild right-sided perinephric stranding and proximal periureteral stranding. No right-sided renal, collecting system, or bladder  calculi however are  noted. Stomach/Bowel: The stomach, small bowel, and colon are normal in appearance. No inflammatory changes or obstructive findings. Vascular/Lymphatic: There are no enlarged abdominal or pelvic lymph nodes. Scattered aortic atherosclerotic calcifications are seen without aneurysmal dilatation. Reproductive: Again noted is a partially exophytic right posterior fibroid with coarse calcifications. Other: No evidence of abdominal wall mass or hernia. Musculoskeletal: No acute or significant osseous findings. Again noted is a grade 2 anterolisthesis of L4 on L5 with endplate irregularity and collapse of the anterior superior endplate of L5, and also fragmentation of the posteroinferior L4 vertebral body. This is seen dating back to MRI of 2019. 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 IMPRESSION: 1. Patchy airspace opacity seen at both lung bases which is non-specific could be related to early infectious etiology and/or atelectasis. 2. Mild inflammatory changes surrounding the right kidney which could be due to pyelonephritis. No right-sided renal or collecting system calculi. 3.  Aortic Atherosclerosis (ICD10-I70.0). Electronically Signed   By: Prudencio Pair M.D.   On: 01/12/2019 02:14   US Renal  Result Date: 01/11/2019 CLINICAL DATA:  63 year old female with worsening renal function. Prior left nephrectomy. EXAM: RENAL / URINARY TRACT ULTRASOUND COMPLETE COMPARISON:  CT abdomen pelvis dated 01/25/2018 and renal ultrasound dated 12/16/2017. FINDINGS: Right Kidney: Renal measurements: 11.1 x 5.6 x 5.8 cm = volume: 191 mL. Mild increased renal echogenicity. No hydronephrosis or shadowing stone. There is a 1.6 x 1.7 x 1.8 cm hypoechoic lesion in the upper pole of the right kidney which is suboptimally characterized, possibly a cyst. Left Kidney: Prior nephrectomy. Bladder: Appears normal for degree of bladder distention. Other: None. IMPRESSION: Mild increased renal echogenicity. No  hydronephrosis or shadowing stone. Electronically Signed   By: Anner Crete M.D.   On: 01/11/2019 22:48   Dg Chest Port 1 View  Result Date: 01/10/2019 CLINICAL DATA:  Body aches and fevers. EXAM: PORTABLE CHEST 1 VIEW COMPARISON:  July 31, 2017 FINDINGS: The heart size is enlarged. There is no pneumothorax. No large pleural effusion. There is likely atelectasis at the lung bases. Aortic calcifications are noted. IMPRESSION: 1. Cardiomegaly without edema or pleural effusion. 2. Bibasilar atelectasis. 3. Aortic atherosclerosis. Electronically Signed   By: Constance Holster M.D.   On: 01/10/2019 16:52   Vas [place] Lower Extremity Venous (dvt)  Result Date: 01/12/2019  Lower Venous Study Indications: Swelling, and Edema.  Limitations: Patient unable to tolerate compression. Comparison Study: no prior Performing Technologist: Abram Sander RVS  Examination Guidelines: A complete evaluation includes B-mode imaging, spectral Doppler, color Doppler, and power Doppler as needed of all accessible portions of each vessel. Bilateral testing is considered an integral part of a complete examination. Limited examinations for reoccurring indications may be performed as noted.  +---------+---------------+---------+-----------+----------+--------------+ RIGHT    CompressibilityPhasicitySpontaneityPropertiesThrombus Aging +---------+---------------+---------+-----------+----------+--------------+ CFV      Full           Yes      Yes                                 +---------+---------------+---------+-----------+----------+--------------+ SFJ      Full                                                        +---------+---------------+---------+-----------+----------+--------------+ FV  Prox  Full                                                        +---------+---------------+---------+-----------+----------+--------------+ FV Mid   Full                                                         +---------+---------------+---------+-----------+----------+--------------+ FV Distal               Yes      Yes                                 +---------+---------------+---------+-----------+----------+--------------+ PFV      Full                                                        +---------+---------------+---------+-----------+----------+--------------+ POP      Full           Yes      Yes                                 +---------+---------------+---------+-----------+----------+--------------+ PTV      Full                                                        +---------+---------------+---------+-----------+----------+--------------+ PERO     Full                                                        +---------+---------------+---------+-----------+----------+--------------+   +---------+---------------+---------+-----------+----------+-------------------+ LEFT     CompressibilityPhasicitySpontaneityPropertiesThrombus Aging      +---------+---------------+---------+-----------+----------+-------------------+ CFV      Full           Yes      Yes                                      +---------+---------------+---------+-----------+----------+-------------------+ SFJ      Full                                                             +---------+---------------+---------+-----------+----------+-------------------+ FV Prox  Full                                                             +---------+---------------+---------+-----------+----------+-------------------+  FV Mid                  Yes      Yes                  unable to compress                                                        due to pain                                                               tolerance           +---------+---------------+---------+-----------+----------+-------------------+ FV Distal               Yes      Yes                   unable to compress                                                        due to pain                                                               tolerance           +---------+---------------+---------+-----------+----------+-------------------+ PFV      Full                                                             +---------+---------------+---------+-----------+----------+-------------------+ POP                     Yes      Yes                  unable to compress                                                        due to pain                                                               tolerance           +---------+---------------+---------+-----------+----------+-------------------+  PTV                                                   unable to compress                                                        due to pain                                                               tolerance           +---------+---------------+---------+-----------+----------+-------------------+ PERO                                                  unable to compress                                                        due to pain                                                               tolerance           +---------+---------------+---------+-----------+----------+-------------------+     Summary: Right: There is no evidence of deep vein thrombosis in the lower extremity. No cystic structure found in the popliteal fossa. Left: There is no evidence of deep vein thrombosis in the lower extremity. However, portions of this examination were limited- see technologist comments above. No cystic structure found in the popliteal fossa.  *See table(s) above for measurements and observations. Electronically signed by Harold Barban MD on 01/12/2019 at 2:43:13 PM.    Final    Scheduled Meds: . heparin  5,000 Units Subcutaneous Q8H  .  hydrocortisone sod succinate (SOLU-CORTEF) inj  100 mg Intravenous Q8H  . hydroxychloroquine  200 mg Oral BID  . vancomycin variable dose per unstable renal function (pharmacist dosing)   Does not apply See admin instructions   Continuous Infusions: . ceFEPime (MAXIPIME) IV 2 g (01/11/19 1942)  .  sodium bicarbonate (isotonic) infusion in sterile water 75 mL/hr at 01/12/19 1506  . [START ON 01/14/2019] vancomycin      LOS: 1 day   Kerney Elbe, DO [facility] PAGER is on [place]  If 7PM-7AM, please contact night-coverage [email or web address]

## 2019-01-13 LAB — RENAL FUNCTION PANEL
Albumin: 2.6 g/dL — ABNORMAL LOW (ref 3.5–5.0)
Anion gap: 12 (ref 5–15)
BUN: 40 mg/dL — ABNORMAL HIGH (ref 8–23)
CO2: 14 mmol/L — ABNORMAL LOW (ref 22–32)
Calcium: 8.4 mg/dL — ABNORMAL LOW (ref 8.9–10.3)
Chloride: 113 mmol/L — ABNORMAL HIGH (ref 98–111)
Creatinine, Ser: 3.39 mg/dL — ABNORMAL HIGH (ref 0.44–1.00)
GFR calc Af Amer: 16 mL/min — ABNORMAL LOW (ref >60)
GFR calc non Af Amer: 14 mL/min — ABNORMAL LOW (ref >60)
Glucose, Bld: 110 mg/dL — ABNORMAL HIGH (ref 70–99)
Phosphorus: 3.8 mg/dL (ref 2.5–4.6)
Potassium: 4.8 mmol/L (ref 3.5–5.1)
Sodium: 139 mmol/L (ref 135–145)

## 2019-01-13 LAB — HEPATIC FUNCTION PANEL
ALT: 16 U/L (ref 0–44)
AST: 16 U/L (ref 15–41)
Albumin: 2.6 g/dL — ABNORMAL LOW (ref 3.5–5.0)
Alkaline Phosphatase: 95 U/L (ref 38–126)
Bilirubin, Direct: 0.1 mg/dL (ref 0.0–0.2)
Indirect Bilirubin: 0.5 mg/dL (ref 0.3–0.9)
Total Bilirubin: 0.6 mg/dL (ref 0.3–1.2)
Total Protein: 5.6 g/dL — ABNORMAL LOW (ref 6.5–8.1)

## 2019-01-13 LAB — MAGNESIUM: Magnesium: 1.9 mg/dL (ref 1.7–2.4)

## 2019-01-13 LAB — CBC WITH DIFFERENTIAL/PLATELET
Abs Immature Granulocytes: 0.35 10*3/uL — ABNORMAL HIGH (ref 0.00–0.07)
Basophils Absolute: 0 10*3/uL (ref 0.0–0.1)
Basophils Relative: 0 %
Eosinophils Absolute: 0 10*3/uL (ref 0.0–0.5)
Eosinophils Relative: 0 %
HCT: 25 % — ABNORMAL LOW (ref 36.0–46.0)
Hemoglobin: 8.3 g/dL — ABNORMAL LOW (ref 12.0–15.0)
Immature Granulocytes: 1 %
Lymphocytes Relative: 2 %
Lymphs Abs: 0.6 10*3/uL — ABNORMAL LOW (ref 0.7–4.0)
MCH: 22.9 pg — ABNORMAL LOW (ref 26.0–34.0)
MCHC: 33.2 g/dL (ref 30.0–36.0)
MCV: 68.9 fL — ABNORMAL LOW (ref 80.0–100.0)
Monocytes Absolute: 0.3 10*3/uL (ref 0.1–1.0)
Monocytes Relative: 1 %
Neutro Abs: 30.6 10*3/uL — ABNORMAL HIGH (ref 1.7–7.7)
Neutrophils Relative %: 96 %
Platelets: 257 10*3/uL (ref 150–400)
RBC: 3.63 MIL/uL — ABNORMAL LOW (ref 3.87–5.11)
RDW: 14.6 % (ref 11.5–15.5)
WBC: 31.9 10*3/uL — ABNORMAL HIGH (ref 4.0–10.5)
nRBC: 0.1 % (ref 0.0–0.2)

## 2019-01-13 LAB — ANA W/REFLEX IF POSITIVE: Anti Nuclear Antibody (ANA): NEGATIVE

## 2019-01-13 LAB — URINE CULTURE: Culture: NO GROWTH

## 2019-01-13 LAB — MRSA PCR SCREENING: MRSA by PCR: NEGATIVE

## 2019-01-13 LAB — C4 COMPLEMENT: Complement C4, Body Fluid: 38 mg/dL (ref 12–38)

## 2019-01-13 LAB — C3 COMPLEMENT: C3 Complement: 152 mg/dL (ref 82–167)

## 2019-01-13 LAB — ANTI-DNA ANTIBODY, DOUBLE-STRANDED: ds DNA Ab: <1 IU/mL (ref 0–9)

## 2019-01-13 LAB — HAPTOGLOBIN: Haptoglobin: 239 mg/dL (ref 37–355)

## 2019-01-13 LAB — LACTATE DEHYDROGENASE: LDH: 176 U/L (ref 98–192)

## 2019-01-13 MED ORDER — PREGABALIN 75 MG PO CAPS
75.0000 mg | ORAL_CAPSULE | Freq: Two times a day (BID) | ORAL | Status: DC
  Administered 2019-01-13 – 2019-01-15 (×4): 75 mg via ORAL
  Filled 2019-01-13 (×6): qty 1

## 2019-01-13 NOTE — TOC Initial Note (Signed)
Transition of Care [facility]) - Initial/Assessment Note    Patient Details  Name: Carolyn Zamora MRN: [id number] Date of Birth: Jul 27, 1955  Transition of [facility]) CM/SW Contact:    Benard Halsted, LCSW Phone Number: 01/13/2019, 11:34 AM  Clinical Narrative:                 CSW received consult for possible SNF placement at time of discharge. CSW spoke with patient regarding PT recommendation of SNF placement at time of discharge. Patient reported that she is not interested in doing any therapy rehab. She declines SNF placement and states that though she lives alone, her daughter comes in daily as well as her sister to assist her. CSW offered home health services. Patient states she is also declining home health services. She reports she is aware of her mobility status and continues to decline services. CSW discussed home equipment needs with her. She states she has a walker, cane, and wheelchair at home and does not require any other equipment. CSW confirmed PCP and address with patient. No further questions reported at this time.    Expected Discharge Plan: Home/Self Care Barriers to Discharge: Continued Medical Work up   Patient Goals and CMS Choice Patient states their goals for this hospitalization and ongoing recovery are:: Return home with family support CMS Medicare.gov Compare Post Acute Care list provided to:: Patient Choice offered to / list presented to : Patient  Expected Discharge Plan and Services Expected Discharge Plan: Home/Self Care In-house Referral: Clinical Social Work Discharge Planning Services: CM Consult Post Acute Care Choice: NA Living arrangements for the past 2 months: Apartment                 DME Arranged: N/A         HH Arranged: Refused HH, Refused SNF [place] Agency: NA        Prior Living Arrangements/Services Living arrangements for the past 2 months: Apartment Lives with:: Self Patient language and need for interpreter reviewed:: Yes Do  you feel safe going back to the place where you live?: Yes      Need for Family Participation in Patient Care: No (Comment) Care giver support system in place?: Yes (comment) Current home services: DME Criminal Activity/Legal Involvement Pertinent to Current Situation/Hospitalization: No - Comment as needed  Activities of Daily Living Home Assistive Devices/Equipment: Gilford Rile (specify type) ADL Screening (condition at time of admission) Patient's cognitive ability adequate to safely complete daily activities?: Yes Is the patient deaf or have difficulty hearing?: No Does the patient have difficulty seeing, even when wearing glasses/contacts?: No Does the patient have difficulty concentrating, remembering, or making decisions?: No Patient able to express need for assistance with ADLs?: Yes Does the patient have difficulty dressing or bathing?: No Independently performs ADLs?: Yes (appropriate for developmental age) Does the patient have difficulty walking or climbing stairs?: Yes Weakness of Legs: Both Weakness of Arms/Hands: None  Permission Sought/Granted                  Emotional Assessment Appearance:: Appears stated age Attitude/Demeanor/Rapport: Gracious, Engaged Affect (typically observed): Accepting, Appropriate, Pleasant Orientation: : Oriented to Self, Oriented to Place, Oriented to  Time, Oriented to Situation Alcohol / Substance Use: Not Applicable Psych Involvement: No (comment)  Admission diagnosis:  Right foot pain [M79.671] Acute cystitis with hematuria [N30.01] AKI (acute kidney injury) ([place]) [N17.9] Sepsis due to urinary tract infection ([place]) [A41.9, N39.0] Patient Active Problem List   Diagnosis Date Noted  . Pain  in both feet 01/11/2019  . Hypomagnesemia 01/11/2019  . Sepsis due to urinary tract infection ([place]) 01/10/2019  . AKI (acute kidney injury) ([place]) 12/15/2017  . Diarrhea 12/15/2017  . Dehydration 12/15/2017  . Anemia of chronic disease  12/15/2017  . Renal failure (ARF), acute on chronic (HCC) 12/15/2017  . History of dental abscess with sepsis 2017 08/02/2017  . History of methicillin resistant staphylococcus aureus (MRSA) 08/02/2017  . AVN (avascular necrosis of bone) ([place]) 08/01/2017  . Intractable vomiting 07/31/2017  . Sepsis ([place]) 07/23/2017  . Abdominal wall seroma - chronic from prior [facility] repair 2017 03/03/2016  . Immunocompromised state ([place]) 02/22/2016  . Low back pain 02/20/2016  . Pressure injury of skin 12/28/2015  . Acute renal failure (ARF) ([place]) 12/27/2015  . UTI (urinary tract infection) 12/27/2015  . Hyperkalemia 12/27/2015  . Hypertension   . Acute renal failure superimposed on stage 3 chronic kidney disease ([place]) 08/03/2015  . Systemic lupus erythematosus ([place]) 03/27/2015  . Acquired absence of spleen 06/21/2014  . 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 Chronic pain associated with significant psychosocial dysfunction 09/01/2013  . Chronic pain syndrome 09/01/2013  . Chronic kidney disease, stage III (moderate) 08/17/2013   PCP:  Beckie Salts, MD Pharmacy:   [id number] [place], [place], [address] [facility] [address] [place], [address] [place] [address] Phone: [phone] Fax: [phone]     Social Determinants of Health (SDOH) Interventions    Readmission Risk Interventions Readmission Risk Prevention Plan 01/13/2019  Transportation Screening Complete  PCP or Specialist Appt within 3-5 Days Complete  HRI or [place] Complete  Social Work Consult for [place] Planning/Counseling Complete  Palliative Care Screening Not Applicable  Medication Review Press photographer) Referral to Pharmacy  Some recent data might be hidden

## 2019-01-13 NOTE — Progress Notes (Signed)
Patient ID: Carolyn Zamora, female   DOB: July 15, 1955, 63 y.o.   MRN: [id number] S: Feels a little better today but feet still hurting O:BP (!) 133/100   Pulse 99   Temp 98.2 F (36.8 C) (Oral)   Resp 18   Ht 5\' 2"  (1.575 m)   Wt 82.6 kg   SpO2 95%   BMI 33.31 kg/m   Intake/Output Summary (Last 24 hours) at 01/13/2019 1355 Last data filed at 01/13/2019 0600 Gross per 24 hour  Intake 994.95 ml  Output 1100 ml  Net -105.05 ml   Intake/Output: I/O last 3 completed shifts: In: 1696.6 [P.O.:200; I.V.:1191.4; IV Piggyback:305.2] Out: 1100 [Urine:1100]  Intake/Output this shift:  No intake/output data recorded. Weight change:  Gen: NAD CVS: no rub Resp: cta Abd: +BS< soft, nt/nd Ext: no edema  Recent Labs  Lab 01/10/19 1549 01/11/19 1648 01/12/19 0207 01/13/19 0215  NA 141 140 138 139  K 4.1 4.6 4.9 4.8  CL 113* 113* 111 113*  CO2 18* 14* 13* 14*  GLUCOSE 75 76 90 110*  BUN 51* 45* 44* 40*  CREATININE 4.42* 4.09* 4.20* 3.39*  ALBUMIN 3.0* 2.4*  --  2.6*  2.6*  CALCIUM 7.6* 7.7* 8.3* 8.4*  PHOS  --   --   --  3.8  AST 17 13*  --  16  ALT 20 17  --  16   Liver Function Tests: Recent Labs  Lab 01/10/19 1549 01/11/19 1648 01/13/19 0215  AST 17 13* 16  ALT 20 17 16   ALKPHOS 91 99 95  BILITOT 0.8 0.7 0.6  PROT 5.9* 5.5* 5.6*  ALBUMIN 3.0* 2.4* 2.6*  2.6*   Recent Labs  Lab 01/10/19 1549  LIPASE 16   No results for input(s): AMMONIA in the last 168 hours. CBC: Recent Labs  Lab 01/10/19 1549 01/11/19 1648 01/12/19 0207 01/13/19 1021  WBC 20.3* 28.5* 36.6* 31.9*  NEUTROABS 15.9* 27.1*  --  30.6*  HGB 8.9* 8.7* 9.4* 8.3*  HCT 29.1* 28.0* 30.3* 25.0*  MCV 73.1* 73.5* 73.7* 68.9*  PLT 199 206 233 257   Cardiac Enzymes: Recent Labs  Lab 01/10/19 1549  CKTOTAL 42   CBG: No results for input(s): GLUCAP in the last 168 hours.  Iron Studies:  Recent Labs    01/12/19 1030  IRON 14*  TIBC 186*  FERRITIN 371*   Studies/Results: Ct Abdomen  Pelvis Wo Contrast  Result Date: 01/12/2019 CLINICAL DATA:  Hydronephrosis, history of CKD EXAM: CT ABDOMEN AND PELVIS WITHOUT CONTRAST TECHNIQUE: Multidetector CT imaging of the abdomen and pelvis was performed following the standard protocol without IV contrast. COMPARISON:  Renal ultrasound January 11, 2019 CT October 22, 2018 FINDINGS: Lower chest: The visualized heart size within normal limits. No pericardial fluid/thickening. No hiatal hernia. There is patchy area of posterior consolidation seen at both lung bases. Hepatobiliary: Although limited due to the lack of intravenous contrast, normal in appearance without gross focal abnormality. Mildly dilated fluid-filled gallbladder. No calcified gallstones. Pancreas:  Unremarkable.  No surrounding inflammatory changes. Spleen: A small amount of splenic tissue seen within the left upper quadrant. Adrenals/Urinary Tract: Both adrenal glands appear normal. Again noted is a left-sided adrenal nephrectomy. There is mild right-sided perinephric stranding and proximal periureteral stranding. No right-sided renal, collecting system, or bladder calculi however are noted. Stomach/Bowel: The stomach, small bowel, and colon are normal in appearance. No inflammatory changes or obstructive findings. Vascular/Lymphatic: There are no enlarged abdominal or pelvic lymph nodes. Scattered aortic atherosclerotic  calcifications are seen without aneurysmal dilatation. Reproductive: Again noted is a partially exophytic right posterior fibroid with coarse calcifications. Other: No evidence of abdominal wall mass or hernia. Musculoskeletal: No acute or significant osseous findings. Again noted is a grade 2 anterolisthesis of L4 on L5 with endplate irregularity and collapse of the anterior superior endplate of L5, and also fragmentation of the posteroinferior L4 vertebral body. This is seen dating back to MRI of 2019. 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 IMPRESSION: 1.  Patchy airspace opacity seen at both lung bases which is non-specific could be related to early infectious etiology and/or atelectasis. 2. Mild inflammatory changes surrounding the right kidney which could be due to pyelonephritis. No right-sided renal or collecting system calculi. 3.  Aortic Atherosclerosis (ICD10-I70.0). Electronically Signed   By: Prudencio Pair M.D.   On: 01/12/2019 02:14   US Renal  Result Date: 01/11/2019 CLINICAL DATA:  63 year old female with worsening renal function. Prior left nephrectomy. EXAM: RENAL / URINARY TRACT ULTRASOUND COMPLETE COMPARISON:  CT abdomen pelvis dated 01/25/2018 and renal ultrasound dated 12/16/2017. FINDINGS: Right Kidney: Renal measurements: 11.1 x 5.6 x 5.8 cm = volume: 191 mL. Mild increased renal echogenicity. No hydronephrosis or shadowing stone. There is a 1.6 x 1.7 x 1.8 cm hypoechoic lesion in the upper pole of the right kidney which is suboptimally characterized, possibly a cyst. Left Kidney: Prior nephrectomy. Bladder: Appears normal for degree of bladder distention. Other: None. IMPRESSION: Mild increased renal echogenicity. No hydronephrosis or shadowing stone. Electronically Signed   By: Anner Crete M.D.   On: 01/11/2019 22:48   Vas [place] Lower Extremity Venous (dvt)  Result Date: 01/12/2019  Lower Venous Study Indications: Swelling, and Edema.  Limitations: Patient unable to tolerate compression. Comparison Study: no prior Performing Technologist: Abram Sander RVS  Examination Guidelines: A complete evaluation includes B-mode imaging, spectral Doppler, color Doppler, and power Doppler as needed of all accessible portions of each vessel. Bilateral testing is considered an integral part of a complete examination. Limited examinations for reoccurring indications may be performed as noted.  +---------+---------------+---------+-----------+----------+--------------+ RIGHT    CompressibilityPhasicitySpontaneityPropertiesThrombus Aging  +---------+---------------+---------+-----------+----------+--------------+ CFV      Full           Yes      Yes                                 +---------+---------------+---------+-----------+----------+--------------+ SFJ      Full                                                        +---------+---------------+---------+-----------+----------+--------------+ FV Prox  Full                                                        +---------+---------------+---------+-----------+----------+--------------+ FV Mid   Full                                                        +---------+---------------+---------+-----------+----------+--------------+  FV Distal               Yes      Yes                                 +---------+---------------+---------+-----------+----------+--------------+ PFV      Full                                                        +---------+---------------+---------+-----------+----------+--------------+ POP      Full           Yes      Yes                                 +---------+---------------+---------+-----------+----------+--------------+ PTV      Full                                                        +---------+---------------+---------+-----------+----------+--------------+ PERO     Full                                                        +---------+---------------+---------+-----------+----------+--------------+   +---------+---------------+---------+-----------+----------+-------------------+ LEFT     CompressibilityPhasicitySpontaneityPropertiesThrombus Aging      +---------+---------------+---------+-----------+----------+-------------------+ CFV      Full           Yes      Yes                                      +---------+---------------+---------+-----------+----------+-------------------+ SFJ      Full                                                              +---------+---------------+---------+-----------+----------+-------------------+ FV Prox  Full                                                             +---------+---------------+---------+-----------+----------+-------------------+ FV Mid                  Yes      Yes                  unable to compress  due to pain                                                               tolerance           +---------+---------------+---------+-----------+----------+-------------------+ FV Distal               Yes      Yes                  unable to compress                                                        due to pain                                                               tolerance           +---------+---------------+---------+-----------+----------+-------------------+ PFV      Full                                                             +---------+---------------+---------+-----------+----------+-------------------+ POP                     Yes      Yes                  unable to compress                                                        due to pain                                                               tolerance           +---------+---------------+---------+-----------+----------+-------------------+ PTV                                                   unable to compress  due to pain                                                               tolerance           +---------+---------------+---------+-----------+----------+-------------------+ PERO                                                  unable to compress                                                        due to pain                                                               tolerance            +---------+---------------+---------+-----------+----------+-------------------+     Summary: Right: There is no evidence of deep vein thrombosis in the lower extremity. No cystic structure found in the popliteal fossa. Left: There is no evidence of deep vein thrombosis in the lower extremity. However, portions of this examination were limited- see technologist comments above. No cystic structure found in the popliteal fossa.  *See table(s) above for measurements and observations. Electronically signed by Harold Barban MD on 01/12/2019 at 2:43:13 PM.    Final    . DULoxetine  60 mg Oral BID  . heparin  5,000 Units Subcutaneous Q8H  . hydrocortisone sod succinate (SOLU-CORTEF) inj  100 mg Intravenous Q8H  . hydroxychloroquine  200 mg Oral BID  . levothyroxine  25 mcg Oral Q0600  . lidocaine  1 patch Transdermal Q24H  . pantoprazole  40 mg Oral BID  . pregabalin  75 mg Oral BID  . traZODone  50 mg Oral Daily  . vancomycin variable dose per unstable renal function (pharmacist dosing)   Does not apply See admin instructions    BMET    Component Value Date/Time   NA 139 01/13/2019 0215   K 4.8 01/13/2019 0215   CL 113 (H) 01/13/2019 0215   CO2 14 (L) 01/13/2019 0215   GLUCOSE 110 (H) 01/13/2019 0215   BUN 40 (H) 01/13/2019 0215   CREATININE 3.39 (H) 01/13/2019 0215   CALCIUM 8.4 (L) 01/13/2019 0215   GFRNONAA 14 (L) 01/13/2019 0215   GFRAA 16 (L) 01/13/2019 0215   CBC    Component Value Date/Time   WBC 31.9 (H) 01/13/2019 1021   RBC 3.63 (L) 01/13/2019 1021   HGB 8.3 (L) 01/13/2019 1021   HCT 25.0 (L) 01/13/2019 1021   PLT 257 01/13/2019 1021   MCV 68.9 (L) 01/13/2019 1021   MCH 22.9 (L) 01/13/2019 1021   MCHC 33.2 01/13/2019 1021   RDW 14.6 01/13/2019 1021   LYMPHSABS 0.6 (L) 01/13/2019 1021   MONOABS 0.3 01/13/2019  1021   EOSABS 0.0 01/13/2019 1021   BASOSABS 0.0 01/13/2019 1021    Assessment/Plan:  1. AKI/CKD stage 4- presumably due to ischemic ATN in setting of sepsis  and pyelonephritis with solitary functioning kidney.  Doubt lupus flare.  Continue with abx and gentle IVF's and follow renal function.  No indication for HD at this time. 1. CT scan of abd/pelvis with perinephric stranding of solitary right kidney 2. Complements normal. 3. Scr improving with IVF's. 2. Severe sepsis- with tachycardia, leukocytosis, cultures pending, Urine culture not sent for unclear reasons.  Will repeat. 3. Metabolic acidosis- due to #1.  Will start isotonic bicarb and follow.  4. Acute right foot pain- presumably due to gout flare.  On stress dose steroids 5. SLE- on plaquenil and prednisone as outpatient, as well as cellcept 500 mg bid.  cellcept on hold due to infection.  6. H/o warm hemolytic anemia- t. Bili normal, LDH elevated. 7. H/o ITP platelets stable and WNL 8. Anemia of CKD- hgb trending down and may require ESA.  Will check iron stores.  Donetta Potts, MD [employer] [phone]

## 2019-01-13 NOTE — Progress Notes (Signed)
PT Cancellation Note  Patient Details Name: Carolyn Zamora MRN: [id number] DOB: [birth year]   Cancelled Treatment:    Reason Eval/Treat Not Completed: Other (comment) Pt adamantly refusing physical therapy; becoming emotionally labile stating her girlfriend's son died. She is willing to get out of bed "when her swelling goes down." Reinforced purpose of therapy and importance of mobility.   Ellamae Sia, PT, DPT Acute Rehabilitation Services Pager [phone] Office [phone]    Willy Eddy 01/13/2019, 1:09 PM

## 2019-01-13 NOTE — Progress Notes (Signed)
PROGRESS NOTE    Carolyn Zamora  TDS:[id number] DOB: 19-Sep-1955 DOA: 01/10/2019 PCP: Beckie Salts, MD   Brief Narrative:  HPI per Dr. Early Osmond on 01/11/2019 Carolyn Zamora is a 63 y.o. female with medical history significant of renal cell carcinoma status post left nephrectomy, history of splenectomy, CKD stage III, chronic pain syndrome, morbid obesity, lupus-on chronic immunosuppressive therapy (mycophenolate, hydroxychloroquine, prednisone), hypertension, anemia of chronic disease came from [facility] for the concern of sepsis secondary to UTI and worsening kidney function.  Patient tells me that she has feet pain which is severe in intensity since 5 days.  Unable to walk/move, could not sleep at night, no aggravating or relieving factors, associated with chills denies association with trauma, fever, decreased appetite, generalized weakness or lethargy.  Had history of gout however currently not on any medicines.  X-ray of right foot obtained at [facility]-which came back negative.  She reports lower abdominal pain and chronic back pain however denies urinary symptoms such as dysuria, hematuria, foul-smelling urine, nocturia, nausea, vomiting.  She lives alone at home and her daughter and sister helps her.  No history of smoking, alcohol, illicit drug use.  At [facility]: Patient was tachycardic, tachypneic, leukocytosis of 20.3, UA positive for small leukocytes.  CK, lipase level: WNL, COVID-19 negative, magnesium: Low-- was replaced.  CMP shows worsening kidney function.  Patient received IV fluids and Rocephin for the concern of urosepsis.  Patient transferred to [facility] for further evaluation and management.  **Interim History Nephrology does not feel that she has a lupus flare but her stress dosing her steroids and checking complement level and serological work-up.  WBC elevated likely in the setting of her steroids and is slowly trending down slightly.   Patient complaining of foot pain still but this is improving.  No evidence of DVT on lower extremity Dopplers.  Patient's hemoglobin is also trending downward so nephrology considering ESA.  Patient had some nausea vomiting yesterday but is improved today.    Assessment & Plan:   Principal Problem:   Sepsis ([place]) Active Problems:   Hypertension   Systemic lupus erythematosus (HCC)   Chronic pain syndrome   Renal cell carcinoma of left kidney s/p nephrectomy   Anemia of chronic disease   Renal failure (ARF), acute on chronic (HCC)   Pain in both feet   Hypomagnesemia  Sepsis of Unknown Etiology but likely in the setting of Pyelonephritis and Complicated by Gout Placement  -Denies any urinary symptoms. Initial UA is positive only for trace leukocytes. -UA was repeated and showed a hazy appearance with small hemoglobin, large leukocytes, negative nitrites, many bacteria, 21-50 WBCs, 0-5 red blood cells per high-power field, and repeat urine culture showed no growth but patient had already been on antibiotics -MRSA PCR was negative -CT Abd/Pelvis showed "Patchy airspace opacity seen at both lung bases which is non-specific could be related to early infectious etiology and/or atelectasis. Mild inflammatory changes surrounding the right kidney which could be due to pyelonephritis. No right-sided renal or collecting system calculi. Aortic Atherosclerosis." -Patient is afebrile, tachycardic (HR still on the faster side), tachypneic, leukocytosis of 20.3-->Trended up to 28.5 yesterday and worsened to 36.6 Today but ? Related to Steroids; now WBC is trended down to 31,900 -Chest x-ray was negative but CT did show patchy airspace opacites; states that she has a mild cough but is nonproductive -Right foot x-ray: Negative.   -COVID-19 negative, lipase: WNL.  -Patient does not have chronic leukocytosis although she  is on prednisone at home.   -Check Lactic Acid and blood culture and procalcitonin  level. -Patient has feet pain, worsening kidney function along with anemia-multiple myeloma could be consideration?. -Patient is obese with limited mobility due to feet pain with history of lupus-she is on intermediate risk for PE however could not get CT angiogram due to worsening kidney function and D-dimer will be non specific due to worsening kidney function. -We will get Doppler ultrasound of bilateral lower extremity to rule out DVT and showed no evidence of DVT but was limited   -Started patient on broad-spectrum antibiotics IV Vanco and cefepime now.  Follow blood cultures and showed NGTD at 2 Days -Patient states that she had some nausea yesterday and some abdominal pain but this is resolved now. -If persists may consider repeating a CT abdomen pelvis  Bilateral Feet Pain, improving  -Unknown etiology?,  Had a history of gout in the past. -We will check uric acid, CRP, sed rate; LDH was 238, Uric Acid was 11.5, and ESR was <4; Repeat  -Avoid NSAIDs; May need a Larger Prednisone Taper but will need to address Infection first and she is on Stress Dose Steroids with Hydrocortisone 100 mg IV q8h  -Dilaudid as needed for pain control -May need Colchicine  -Resume Pain Meds as below   AKI on CKD stage IV; improving  Metabolic Acidosis -Likely secondary from underlying infection and nephrology feels presumably due to ischemic ATN in setting of sepsis and pyelonephritis with a solitary functioning kidney do not feel that this is consistent with a lupus flare -Patient is BUN/creatinine went from 51/4.42 -> 45/4.09 -> 44/4.20 -> 40/3.39 -Patient's CO2 was 13, anion gap was 14, and chloride levels 111; Now AG is 12, CO2 is 14, and Chloride is 113 -Nephrology started the patient on sodium bicarbonate infusion with 150 mEq at 75 mL's per hour and will continue  -Patient has history of left nephrectomy due to renal cell carcinoma. -Potassium: WNL and was 4.8.   -Renal ultrasound showed "Mild  increased renal echogenicity. No hydronephrosis or shadowing stone."  -Avoid nephrotoxic medications as well as contrast dyes and patient is currently having her furosemide held [facility] nephrology for further evaluation and management.   -Per Nephrology no indication for HD at this time and Dr. Marval Regal recommending continuing Abx and gentle IVF -Repeat CMP in AM   Microcytic Anemia/Anemia of Chronic Kidney Disease  -Has a history of warm hemolytic anemia but her T bili is normal and LDH is elevated above normal at 238 and now down to 176 -Patient's Hb/Hct went from 8.9/29.1 -> 8.7/20.0 -> 9.4/30.3 -> 8.3/25.0 -T Bili yesterday was 0.7 -Checked Anemia Panel and showed an iron level of 14, U IBC of 172, TIBC of 186, saturation ratios of 8%, ferritin level 371 -Allergy considering giving the patient ESA -No signs of active bleeding, monitor H&H closely. -We will check iron studies.  Hypomagnesemia -Replaced and improved and Mag Level went from 1.4 -> 2.2 -> 1.9 -Continue to Monitor and Replete as Necessary -Repeat Mag Level in AM   Lupus -Mycophenolate 500 mg po BID held by Dr. Hollie Salk -Prednisone 5 mg po Daily held for Stress Dose Steroids of -C/w Hydroxychloroquine 200 mg po BID -Checking Complement Serologies as well ANA/dsDNA to asses for Lupus Flare -Dr. Hollie Salk also checking UP/C but has "Dirty U/A"  Hypertension  -On Amlodipine at home but this was held due to Sepsis  -Monitor her blood pressure closely. -Last BP was 133/100  Depression/Anxiety -C/w Trazodone 50 mg po Daily and with Duloxetine 60 mg po BID   GERD -C/w Pantoprazole 40 mg pO BID   Chronic Pain Syndrome -On Hydrocodone-Acetaminophen 1 tab po q8hprn Moderate pain, Duloxetine 60 mg po BID, Pregabalin 75 mg po TID -Also have Hydromorphone 1 mg IV q4hprn Severe Pain  -Will also resume Tizanidine 6 mg po BIDprn Muscle Spasms  Obesity -Estimated body mass index is 33.31 kg/m as calculated from the  following:   Height as of this encounter: '5\' 2"'  (1.575 m).   Weight as of this encounter: 82.6 kg. -Weight Loss and Dietary Counseling given   DVT prophylaxis: Heparin 5,000 units sq q8h Code Status: FULL CODE  Family Communication: No family present at bedside  Disposition Plan: Pending further improvement back to baseline and evaluation by PT/OT; Refusing SNF   Consultants:   Nephrology  Procedures: None   Antimicrobials:  Anti-infectives (From admission, onward)   Start     Dose/Rate Route Frequency Ordered Stop   01/14/19 0600  vancomycin (VANCOCIN) IVPB 1000 mg/200 mL premix     1,000 mg 200 mL/hr over 60 Minutes Intravenous Every 48 hours 01/12/19 1041     01/12/19 0445  vancomycin (VANCOCIN) 1,500 mg in sodium chloride 0.9 % 500 mL IVPB     1,500 mg 250 mL/hr over 120 Minutes Intravenous NOW 01/12/19 0426 01/12/19 0750   01/11/19 2200  hydroxychloroquine (PLAQUENIL) tablet 200 mg     200 mg Oral 2 times daily 01/11/19 2105     01/11/19 1830  vancomycin (VANCOCIN) 1,500 mg in sodium chloride 0.9 % 500 mL IVPB  Status:  Discontinued     1,500 mg 250 mL/hr over 120 Minutes Intravenous  Once 01/11/19 1817 01/12/19 0426   01/11/19 1830  ceFEPIme (MAXIPIME) 2 g in sodium chloride 0.9 % 100 mL IVPB     2 g 200 mL/hr over 30 Minutes Intravenous Every 24 hours 01/11/19 1817     01/11/19 1818  vancomycin variable dose per unstable renal function (pharmacist dosing)      Does not apply See admin instructions 01/11/19 1818     01/10/19 1845  cefTRIAXone (ROCEPHIN) 1 g in sodium chloride 0.9 % 100 mL IVPB     1 g 200 mL/hr over 30 Minutes Intravenous  Once 01/10/19 1835 01/10/19 2004     Subjective: Seen and examined at bedside and she was sleeping and awoken from sleep.  She was having some tremors and states that she "has shakes in the morning sometimes".  No nausea or vomiting currently but states that she is nauseous and had some mild abdominal pain yesterday.  Thinks that the  pain in her feet is getting little bit better but still not completely resolved.  No other concerns or complaints at this time.   Objective: Vitals:   01/13/19 0400 01/13/19 0403 01/13/19 0800 01/13/19 1200  BP:   (!) 141/70 (!) 133/100  Pulse:   80 99  Resp:   20 18  Temp: 98.2 F (36.8 C)     TempSrc: Oral     SpO2:   100% 95%  Weight:  82.6 kg    Height:        Intake/Output Summary (Last 24 hours) at 01/13/2019 1441 Last data filed at 01/13/2019 0600 Gross per 24 hour  Intake 994.95 ml  Output 1100 ml  Net -105.05 ml   Filed Weights   01/10/19 1144 01/13/19 0403  Weight: 75.8 kg 82.6 kg  Examination: Physical Exam:  Constitutional: WN/WD obese AAF currently in NAD and appears calm Eyes: Lids and conjunctivae normal, sclerae anicteric  ENMT: External Ears, Nose appear normal. Grossly normal hearing.  Neck: Appears normal, supple, no cervical masses, normal ROM, no appreciable thyromegaly; no JVD Respiratory: Diminished to auscultation bilaterally, no wheezing, rales, rhonchi or crackles. Normal respiratory effort and patient is not tachypenic. No accessory muscle use. Unlabored breathing  Cardiovascular: Mildly Tachycardic rate but regular rhythm, no murmurs / rubs / gallops. S1 and S2 auscultated. Mild LE edema.  Abdomen: Soft, non-tender, Distended due to body habitus. Bowel sounds positive.  GU: Deferred. Musculoskeletal: No clubbing / cyanosis of digits/nails. No joint deformity upper and lower extremities. Skin: No rashes, lesions, ulcers on a limited skin evaluation. No induration; Warm and dry.  Neurologic: CN 2-12 grossly intact with no focal deficits. Romberg sign and cerebellar reflexes not assessed.  Psychiatric: Normal judgment and insight. Was a little somnolent this AM but oriented x 3. Not as anxious as yesterday.   Data Reviewed: I have personally reviewed following labs and imaging studies  CBC: Recent Labs  Lab 01/10/19 1549 01/11/19 1648  01/12/19 0207 01/13/19 1021  WBC 20.3* 28.5* 36.6* 31.9*  NEUTROABS 15.9* 27.1*  --  30.6*  HGB 8.9* 8.7* 9.4* 8.3*  HCT 29.1* 28.0* 30.3* 25.0*  MCV 73.1* 73.5* 73.7* 68.9*  PLT 199 206 233 903   Basic Metabolic Panel: Recent Labs  Lab 01/10/19 1549 01/11/19 0631 01/11/19 1648 01/12/19 0207 01/13/19 0215  NA 141  --  140 138 139  K 4.1  --  4.6 4.9 4.8  CL 113*  --  113* 111 113*  CO2 18*  --  14* 13* 14*  GLUCOSE 75  --  76 90 110*  BUN 51*  --  45* 44* 40*  CREATININE 4.42*  --  4.09* 4.20* 3.39*  CALCIUM 7.6*  --  7.7* 8.3* 8.4*  MG 1.2* 1.4*  --  2.2 1.9  PHOS  --   --   --   --  3.8   GFR: Estimated Creatinine Clearance: 17.1 mL/min (A) (by C-G formula based on SCr of 3.39 mg/dL (H)). Liver Function Tests: Recent Labs  Lab 01/10/19 1549 01/11/19 1648 01/13/19 0215  AST 17 13* 16  ALT '20 17 16  ' ALKPHOS 91 99 95  BILITOT 0.8 0.7 0.6  PROT 5.9* 5.5* 5.6*  ALBUMIN 3.0* 2.4* 2.6*   2.6*   Recent Labs  Lab 01/10/19 1549  LIPASE 16   No results for input(s): AMMONIA in the last 168 hours. Coagulation Profile: No results for input(s): INR, PROTIME in the last 168 hours. Cardiac Enzymes: Recent Labs  Lab 01/10/19 1549  CKTOTAL 42   BNP (last 3 results) No results for input(s): PROBNP in the last 8760 hours. HbA1C: No results for input(s): HGBA1C in the last 72 hours. CBG: No results for input(s): GLUCAP in the last 168 hours. Lipid Profile: No results for input(s): CHOL, HDL, LDLCALC, TRIG, CHOLHDL, LDLDIRECT in the last 72 hours. Thyroid Function Tests: No results for input(s): TSH, T4TOTAL, FREET4, T3FREE, THYROIDAB in the last 72 hours. Anemia Panel: Recent Labs    01/12/19 1030  FERRITIN 371*  TIBC 186*  IRON 14*   Sepsis Labs: Recent Labs  Lab 01/11/19 1802 01/11/19 1807 01/11/19 2004  PROCALCITON 3.53  --   --   LATICACIDVEN  --  0.9 1.1    Recent Results (from the past 240 hour(s))  SARS Coronavirus  2 Ag (30 min TAT) - Nasal  Swab (BD Veritor Kit)     Status: None   Collection Time: 01/10/19  3:49 PM   Specimen: Nasal Swab (BD Veritor Kit)  Result Value Ref Range Status   SARS Coronavirus 2 Ag NEGATIVE NEGATIVE Final    Comment: (NOTE) SARS-CoV-2 antigen NOT DETECTED.  Negative results are presumptive.  Negative results do not preclude SARS-CoV-2 infection and should not be used as the sole basis for treatment or other patient management decisions, including infection  control decisions, particularly in the presence of clinical signs and  symptoms consistent with COVID-19, or in those who have been in contact with the virus.  Negative results must be combined with clinical observations, patient history, and epidemiological information. The expected result is Negative. Fact Sheet for Patients: PodPark.tn Fact Sheet for Healthcare Providers: GiftContent.is This test is not yet approved or cleared by the [place] FDA and  has been authorized for detection and/or diagnosis of SARS-CoV-2 by FDA under an Emergency Use Authorization (EUA).  This EUA will remain in effect (meaning this test can be used) for the duration of  the COVID-19 de claration under Section 564(b)(1) of the Act, 21 U.S.C. section 360bbb-3(b)(1), unless the authorization is terminated or revoked sooner. Performed at [facility], [address] [place], [place] [address]   SARS CORONAVIRUS 2 (TAT 6-24 HRS) Nasopharyngeal Urine, Catheterized     Status: None   Collection Time: 01/10/19  6:03 PM   Specimen: Urine, Catheterized; Nasopharyngeal  Result Value Ref Range Status   SARS Coronavirus 2 NEGATIVE NEGATIVE Final    Comment: (NOTE) SARS-CoV-2 target nucleic acids are NOT DETECTED. The SARS-CoV-2 RNA is generally detectable in upper and lower respiratory specimens during the acute phase of infection. Negative results do not preclude SARS-CoV-2 infection, do not  rule out co-infections with other pathogens, and should not be used as the sole basis for treatment or other patient management decisions. Negative results must be combined with clinical observations, patient history, and epidemiological information. The expected result is Negative. Fact Sheet for Patients: SugarRoll.be Fact Sheet for Healthcare Providers: https://www.woods-mathews.com/ This test is not yet approved or cleared by the [place] FDA and  has been authorized for detection and/or diagnosis of SARS-CoV-2 by FDA under an Emergency Use Authorization (EUA). This EUA will remain  in effect (meaning this test can be used) for the duration of the COVID-19 declaration under Section 56 4(b)(1) of the Act, 21 U.S.C. section 360bbb-3(b)(1), unless the authorization is terminated or revoked sooner. Performed at [facility], [place] [address] [place], [place] [address]   Culture, blood (routine x 2)     Status: None (Preliminary result)   Collection Time: 01/11/19  6:02 PM   Specimen: BLOOD RIGHT HAND  Result Value Ref Range Status   Specimen Description BLOOD RIGHT HAND  Final   Special Requests   Final    BOTTLES DRAWN AEROBIC ONLY Blood Culture results may not be optimal due to an inadequate volume of blood received in culture bottles   Culture   Final    NO GROWTH 2 DAYS Performed at [facility], [place] [address], [place], [place] [address]    Report Status PENDING  Incomplete  Culture, blood (routine x 2)     Status: None (Preliminary result)   Collection Time: 01/11/19  6:07 PM   Specimen: BLOOD LEFT HAND  Result Value Ref Range Status   Specimen Description  BLOOD LEFT HAND  Final   Special Requests   Final    BOTTLES DRAWN AEROBIC ONLY Blood Culture results may not be optimal due to an inadequate volume of blood received in culture bottles   Culture   Final    NO GROWTH 2 DAYS Performed at [facility],  [place] [address], [place], [place] [address]    Report Status PENDING  Incomplete  Urine culture     Status: None   Collection Time: 01/12/19  5:10 PM   Specimen: Urine, Clean Catch  Result Value Ref Range Status   Specimen Description URINE, CLEAN CATCH  Final   Special Requests Immunocompromised  Final   Culture   Final    NO GROWTH Performed at [facility], [place] [address] [place], [place] [address]    Report Status 01/13/2019 FINAL  Final  MRSA PCR Screening     Status: None   Collection Time: 01/12/19 11:05 PM   Specimen: Nasopharyngeal  Result Value Ref Range Status   MRSA by PCR NEGATIVE NEGATIVE Final    Comment:        The GeneXpert MRSA Assay (FDA approved for NASAL specimens only), is one component of a comprehensive MRSA colonization surveillance program. It is not intended to diagnose MRSA infection nor to guide or monitor treatment for MRSA infections. Performed at [facility], [place] [address], [place], [place] [address]     Radiology Studies: Ct Abdomen Pelvis Wo Contrast  Result Date: 01/12/2019 CLINICAL DATA:  Hydronephrosis, history of CKD EXAM: CT ABDOMEN AND PELVIS WITHOUT CONTRAST TECHNIQUE: Multidetector CT imaging of the abdomen and pelvis was performed following the standard protocol without IV contrast. COMPARISON:  Renal ultrasound January 11, 2019 CT October 22, 2018 FINDINGS: Lower chest: The visualized heart size within normal limits. No pericardial fluid/thickening. No hiatal hernia. There is patchy area of posterior consolidation seen at both lung bases. Hepatobiliary: Although limited due to the lack of intravenous contrast, normal in appearance without gross focal abnormality. Mildly dilated fluid-filled gallbladder. No calcified gallstones. Pancreas:  Unremarkable.  No surrounding inflammatory changes. Spleen: A small amount of splenic tissue seen within the left upper quadrant. Adrenals/Urinary Tract: Both adrenal glands appear normal.  Again noted is a left-sided adrenal nephrectomy. There is mild right-sided perinephric stranding and proximal periureteral stranding. No right-sided renal, collecting system, or bladder calculi however are noted. Stomach/Bowel: The stomach, small bowel, and colon are normal in appearance. No inflammatory changes or obstructive findings. Vascular/Lymphatic: There are no enlarged abdominal or pelvic lymph nodes. Scattered aortic atherosclerotic calcifications are seen without aneurysmal dilatation. Reproductive: Again noted is a partially exophytic right posterior fibroid with coarse calcifications. Other: No evidence of abdominal wall mass or hernia. Musculoskeletal: No acute or significant osseous findings. Again noted is a grade 2 anterolisthesis of L4 on L5 with endplate irregularity and collapse of the anterior superior endplate of L5, and also fragmentation of the posteroinferior L4 vertebral body. This is seen dating back to MRI of 2019. 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 IMPRESSION: 1. Patchy airspace opacity seen at both lung bases which is non-specific could be related to early infectious etiology and/or atelectasis. 2. Mild inflammatory changes surrounding the right kidney which could be due to pyelonephritis. No right-sided renal or collecting system calculi. 3.  Aortic Atherosclerosis (ICD10-I70.0). Electronically Signed   By: Prudencio Pair M.D.   On: 01/12/2019 02:14   US Renal  Result Date: 01/11/2019 CLINICAL DATA:  63 year old female with worsening renal function. Prior left nephrectomy. EXAM: RENAL / URINARY TRACT ULTRASOUND COMPLETE COMPARISON:  CT abdomen pelvis dated 01/25/2018 and renal ultrasound dated 12/16/2017. FINDINGS: Right Kidney: Renal measurements: 11.1 x 5.6 x 5.8 cm = volume: 191 mL. Mild increased renal echogenicity. No hydronephrosis or shadowing stone. There is a 1.6 x 1.7 x 1.8 cm hypoechoic lesion in the upper pole of the right kidney which is suboptimally  characterized, possibly a cyst. Left Kidney: Prior nephrectomy. Bladder: Appears normal for degree of bladder distention. Other: None. IMPRESSION: Mild increased renal echogenicity. No hydronephrosis or shadowing stone. Electronically Signed   By: Anner Crete M.D.   On: 01/11/2019 22:48   Vas [place] Lower Extremity Venous (dvt)  Result Date: 01/12/2019  Lower Venous Study Indications: Swelling, and Edema.  Limitations: Patient unable to tolerate compression. Comparison Study: no prior Performing Technologist: Abram Sander RVS  Examination Guidelines: A complete evaluation includes B-mode imaging, spectral Doppler, color Doppler, and power Doppler as needed of all accessible portions of each vessel. Bilateral testing is considered an integral part of a complete examination. Limited examinations for reoccurring indications may be performed as noted.  +---------+---------------+---------+-----------+----------+--------------+  RIGHT     Compressibility Phasicity Spontaneity Properties Thrombus Aging  +---------+---------------+---------+-----------+----------+--------------+  CFV       Full            Yes       Yes                                    +---------+---------------+---------+-----------+----------+--------------+  SFJ       Full                                                             +---------+---------------+---------+-----------+----------+--------------+  FV Prox   Full                                                             +---------+---------------+---------+-----------+----------+--------------+  FV Mid    Full                                                             +---------+---------------+---------+-----------+----------+--------------+  FV Distal                 Yes       Yes                                    +---------+---------------+---------+-----------+----------+--------------+  PFV       Full                                                              +---------+---------------+---------+-----------+----------+--------------+  POP       Full            Yes       Yes                                    +---------+---------------+---------+-----------+----------+--------------+  PTV       Full                                                             +---------+---------------+---------+-----------+----------+--------------+  PERO      Full                                                             +---------+---------------+---------+-----------+----------+--------------+   +---------+---------------+---------+-----------+----------+-------------------+  LEFT      Compressibility Phasicity Spontaneity Properties Thrombus Aging       +---------+---------------+---------+-----------+----------+-------------------+  CFV       Full            Yes       Yes                                         +---------+---------------+---------+-----------+----------+-------------------+  SFJ       Full                                                                  +---------+---------------+---------+-----------+----------+-------------------+  FV Prox   Full                                                                  +---------+---------------+---------+-----------+----------+-------------------+  FV Mid                    Yes       Yes                    unable to compress                                                               due to pain  tolerance            +---------+---------------+---------+-----------+----------+-------------------+  FV Distal                 Yes       Yes                    unable to compress                                                               due to pain                                                                      tolerance            +---------+---------------+---------+-----------+----------+-------------------+  PFV       Full                                                                   +---------+---------------+---------+-----------+----------+-------------------+  POP                       Yes       Yes                    unable to compress                                                               due to pain                                                                      tolerance            +---------+---------------+---------+-----------+----------+-------------------+  PTV                                                        unable to compress  due to pain                                                                      tolerance            +---------+---------------+---------+-----------+----------+-------------------+  PERO                                                       unable to compress                                                               due to pain                                                                      tolerance            +---------+---------------+---------+-----------+----------+-------------------+     Summary: Right: There is no evidence of deep vein thrombosis in the lower extremity. No cystic structure found in the popliteal fossa. Left: There is no evidence of deep vein thrombosis in the lower extremity. However, portions of this examination were limited- see technologist comments above. No cystic structure found in the popliteal fossa.  *See table(s) above for measurements and observations. Electronically signed by Harold Barban MD on 01/12/2019 at 2:43:13 PM.    Final    Scheduled Meds:  DULoxetine  60 mg Oral BID   heparin  5,000 Units Subcutaneous Q8H   hydrocortisone sod succinate (SOLU-CORTEF) inj  100 mg Intravenous Q8H   hydroxychloroquine  200 mg Oral BID   levothyroxine  25 mcg Oral Q0600   lidocaine  1 patch Transdermal Q24H   pantoprazole  40 mg Oral BID   pregabalin  75 mg Oral BID   traZODone  50 mg  Oral Daily   vancomycin variable dose per unstable renal function (pharmacist dosing)   Does not apply See admin instructions   Continuous Infusions:  ceFEPime (MAXIPIME) IV 2 g (01/12/19 1756)    sodium bicarbonate (isotonic) infusion in sterile water 75 mL/hr at 01/12/19 1506   [START ON 01/14/2019] vancomycin      LOS: 2 days   Kerney Elbe, DO [facility] PAGER is on [place]  If 7PM-7AM, please contact night-coverage [email or web address]

## 2019-01-14 LAB — CBC WITH DIFFERENTIAL/PLATELET
Abs Immature Granulocytes: 0.3 10*3/uL — ABNORMAL HIGH (ref 0.00–0.07)
Basophils Absolute: 0 10*3/uL (ref 0.0–0.1)
Basophils Relative: 0 %
Eosinophils Absolute: 0 10*3/uL (ref 0.0–0.5)
Eosinophils Relative: 0 %
HCT: 23.1 % — ABNORMAL LOW (ref 36.0–46.0)
Hemoglobin: 7.6 g/dL — ABNORMAL LOW (ref 12.0–15.0)
Immature Granulocytes: 1 %
Lymphocytes Relative: 2 %
Lymphs Abs: 0.7 10*3/uL (ref 0.7–4.0)
MCH: 22.6 pg — ABNORMAL LOW (ref 26.0–34.0)
MCHC: 32.9 g/dL (ref 30.0–36.0)
MCV: 68.5 fL — ABNORMAL LOW (ref 80.0–100.0)
Monocytes Absolute: 0.3 10*3/uL (ref 0.1–1.0)
Monocytes Relative: 1 %
Neutro Abs: 29.2 10*3/uL — ABNORMAL HIGH (ref 1.7–7.7)
Neutrophils Relative %: 96 %
Platelets: 270 10*3/uL (ref 150–400)
RBC: 3.37 MIL/uL — ABNORMAL LOW (ref 3.87–5.11)
RDW: 14.6 % (ref 11.5–15.5)
WBC: 30.6 10*3/uL — ABNORMAL HIGH (ref 4.0–10.5)
nRBC: 0.3 % — ABNORMAL HIGH (ref 0.0–0.2)

## 2019-01-14 LAB — RENAL FUNCTION PANEL
Albumin: 2.4 g/dL — ABNORMAL LOW (ref 3.5–5.0)
Anion gap: 10 (ref 5–15)
BUN: 37 mg/dL — ABNORMAL HIGH (ref 8–23)
CO2: 22 mmol/L (ref 22–32)
Calcium: 8.1 mg/dL — ABNORMAL LOW (ref 8.9–10.3)
Chloride: 106 mmol/L (ref 98–111)
Creatinine, Ser: 2.92 mg/dL — ABNORMAL HIGH (ref 0.44–1.00)
GFR calc Af Amer: 19 mL/min — ABNORMAL LOW (ref >60)
GFR calc non Af Amer: 17 mL/min — ABNORMAL LOW (ref >60)
Glucose, Bld: 119 mg/dL — ABNORMAL HIGH (ref 70–99)
Phosphorus: 3.3 mg/dL (ref 2.5–4.6)
Potassium: 4 mmol/L (ref 3.5–5.1)
Sodium: 138 mmol/L (ref 135–145)

## 2019-01-14 LAB — IRON AND TIBC
Iron: 107 ug/dL (ref 28–170)
Saturation Ratios: 63 % — ABNORMAL HIGH (ref 10.4–31.8)
TIBC: 169 ug/dL — ABNORMAL LOW (ref 250–450)
UIBC: 62 ug/dL

## 2019-01-14 LAB — HEPATIC FUNCTION PANEL
ALT: 14 U/L (ref 0–44)
AST: 14 U/L — ABNORMAL LOW (ref 15–41)
Albumin: 2.4 g/dL — ABNORMAL LOW (ref 3.5–5.0)
Alkaline Phosphatase: 86 U/L (ref 38–126)
Bilirubin, Direct: <0.1 mg/dL (ref 0.0–0.2)
Total Bilirubin: 0.6 mg/dL (ref 0.3–1.2)
Total Protein: 5.4 g/dL — ABNORMAL LOW (ref 6.5–8.1)

## 2019-01-14 LAB — MAGNESIUM: Magnesium: 1.7 mg/dL (ref 1.7–2.4)

## 2019-01-14 LAB — FERRITIN: Ferritin: 355 ng/mL — ABNORMAL HIGH (ref 11–307)

## 2019-01-14 MED ORDER — TRAZODONE HCL 50 MG PO TABS
50.0000 mg | ORAL_TABLET | Freq: Every day | ORAL | Status: DC
  Administered 2019-01-14: 50 mg via ORAL
  Filled 2019-01-14 (×2): qty 1

## 2019-01-14 MED ORDER — DARBEPOETIN ALFA 150 MCG/0.3ML IJ SOSY
150.0000 ug | PREFILLED_SYRINGE | INTRAMUSCULAR | Status: DC
  Administered 2019-01-14 – 2019-01-21 (×2): 150 ug via SUBCUTANEOUS
  Filled 2019-01-14 (×2): qty 0.3

## 2019-01-14 MED ORDER — PREDNISONE 20 MG PO TABS
40.0000 mg | ORAL_TABLET | Freq: Every day | ORAL | Status: DC
  Administered 2019-01-15: 40 mg via ORAL
  Filled 2019-01-14 (×2): qty 2

## 2019-01-14 NOTE — Progress Notes (Signed)
Physical Therapy Treatment Patient Details Name: Carolyn Zamora MRN: [id number] DOB: November 08, 1955 Today's Date: 01/14/2019    History of Present Illness Pt is a 63 y/o F admitted on 01/10/19 for feet pain x 5 days & worsening renal function. Pt found to have sepsis of unknown etiology. PMH significant for renal cell carcinoma s/p L nephrectomy, splenectomy, CKD stage 3, chronic pain syndrome, morbid obesity, lupus on chronic immunosuppressive therapy, HTN, anemia.    PT Comments    Pt agreeable to limited assessment with max encouragement. Pt continuously stating "I can't stand, I can't do this," before attempt. However, then stating, "but I will be able to do it when I get home." Pt requiring two person maximal assist to stand from edge of bed, releasing the walker with her hands and then grasping onto therapist arms. She is unable to ambulate and abruptly sits back down. Limited by bilateral foot pain with weightbearing. Pt presents with significant deconditioning, balance impairments, and decreased cognition. Prior to admission, she lives alone. Will need SNF upon discharge.    Follow Up Recommendations  SNF;Supervision/Assistance - 24 hour     Equipment Recommendations  Hospital bed (states she has a w/c)   Recommendations for Other Services       Precautions / Restrictions Precautions Precautions: Fall Restrictions Weight Bearing Restrictions: No    Mobility  Bed Mobility Overal bed mobility: Needs Assistance Bed Mobility: Supine to Sit;Sit to Supine     Supine to sit: Min assist Sit to supine: Mod assist   General bed mobility comments: MinA to pull up to sitting on edge of bed, modA for BLE negotiation back into bed  Transfers Overall transfer level: Needs assistance Equipment used: Rolling walker (2 wheeled) Transfers: Sit to/from Stand Sit to Stand: Max assist;+2 physical assistance         General transfer comment: MaxA + 2 to boost hips up from edge of  bed, unable to achieve upright. Pt very fearful of falling and transferring hands from walker to therapist arm    Ambulation/Gait             General Gait Details: unable   Stairs             Wheelchair Mobility    Modified Rankin (Stroke Patients Only)       Balance Overall balance assessment: Needs assistance Sitting-balance support: Feet supported Sitting balance-Leahy Scale: Fair     Standing balance support: Bilateral upper extremity supported;During functional activity Standing balance-Leahy Scale: Poor                              Cognition Arousal/Alertness: Awake/alert Behavior During Therapy: WFL for tasks assessed/performed Overall Cognitive Status: Impaired/Different from baseline Area of Impairment: Memory;Safety/judgement                     Memory: Decreased short-term memory   Safety/Judgement: Decreased awareness of deficits     General Comments: Pt very self limiting, decreased awareness of deficits, stating, "I will be able to do this when I get home, I just can't now."      Exercises      General Comments        Pertinent Vitals/Pain Pain Assessment: Faces Faces Pain Scale: Hurts whole lot Pain Location: BLE gout pain in feet Pain Descriptors / Indicators: Grimacing;Guarding;Moaning Pain Intervention(s): Limited activity within patient's tolerance;Monitored during session;Repositioned    Home Living  Prior Function            PT Goals (current goals can now be found in the care plan section) Acute Rehab PT Goals Patient Stated Goal: decreased pain Potential to Achieve Goals: Fair Progress towards PT goals: Progressing toward goals    Frequency    Min 2X/week      PT Plan Current plan remains appropriate    Co-evaluation              AM-PAC PT "6 Clicks" Mobility   Outcome Measure  Help needed turning from your back to your side while in a flat bed  without using bedrails?: A Lot Help needed moving from lying on your back to sitting on the side of a flat bed without using bedrails?: A Little Help needed moving to and from a bed to a chair (including a wheelchair)?: A Lot Help needed standing up from a chair using your arms (e.g., wheelchair or bedside chair)?: Total Help needed to walk in hospital room?: Total Help needed climbing 3-5 steps with a railing? : Total 6 Click Score: 10    End of Session Equipment Utilized During Treatment: Gait belt Activity Tolerance: Patient limited by pain Patient left: in bed;with bed alarm set;with call bell/phone within reach Nurse Communication: Mobility status PT Visit Diagnosis: Other abnormalities of gait and mobility (R26.89);Muscle weakness (generalized) (M62.81)     Time: 1103-1594 PT Time Calculation (min) (ACUTE ONLY): 28 min  Charges:  $Therapeutic Activity: 23-37 mins                     Ellamae Sia, [place], DPT Acute Rehabilitation Services Pager [phone] Office [phone]    Willy Eddy 01/14/2019, 5:35 PM

## 2019-01-14 NOTE — Progress Notes (Signed)
PROGRESS NOTE    Carolyn Zamora  JJH:[id number] DOB: September 12, 1955 DOA: 01/10/2019 PCP: Beckie Salts, MD   Brief Narrative:  HPI per Dr. Early Osmond on 01/11/2019 Carolyn Zamora is a 63 y.o. female with medical history significant of renal cell carcinoma status post left nephrectomy, history of splenectomy, CKD stage III, chronic pain syndrome, morbid obesity, lupus-on chronic immunosuppressive therapy (mycophenolate, hydroxychloroquine, prednisone), hypertension, anemia of chronic disease came from [place] for the concern of sepsis secondary to UTI and worsening kidney function.  Patient tells me that she has feet pain which is severe in intensity since 5 days.  Unable to walk/move, could not sleep at night, no aggravating or relieving factors, associated with chills denies association with trauma, fever, decreased appetite, generalized weakness or lethargy.  Had history of gout however currently not on any medicines.  X-ray of right foot obtained at [facility]-which came back negative.  She reports lower abdominal pain and chronic back pain however denies urinary symptoms such as dysuria, hematuria, foul-smelling urine, nocturia, nausea, vomiting.  She lives alone at home and her daughter and sister helps her.  No history of smoking, alcohol, illicit drug use.  At [facility]: Patient was tachycardic, tachypneic, leukocytosis of 20.3, UA positive for small leukocytes.  CK, lipase level: WNL, COVID-19 negative, magnesium: Low-- was replaced.  CMP shows worsening kidney function.  Patient received IV fluids and Rocephin for the concern of urosepsis.  Patient transferred to [facility] for further evaluation and management.  **Interim History Nephrology does not feel that she has a lupus flare but her stress dosing her steroids and checking complement level and serological work-up which was negative for Lupus flare.  WBC elevated likely in the setting of her steroids and is  slowly trending down slightly.  Patient complaining of foot pain still but this is improving after being on Steroids.  No evidence of DVT on lower extremity Dopplers.  Patient's hemoglobin is also trending downward so nephrology will start ESA.  Patient had some nausea vomiting the day before yesterday but is improved today. PT/OT recommending SNF but Patient refusing and wanting to go Home.  Stress dose steroids have been tapered down to prednisone 40 mg and will be further tapered down to 5 mg prednisone daily to her baseline dose.  Assessment & Plan:   Principal Problem:   Sepsis ([place]) Active Problems:   Hypertension   Systemic lupus erythematosus (HCC)   Chronic pain syndrome   Renal cell carcinoma of left kidney s/p nephrectomy   Anemia of chronic disease   Renal failure (ARF), acute on chronic (HCC)   Pain in both feet   Hypomagnesemia  Sepsis of Unknown Etiology but likely in the setting of Pyelonephritis and Complicated by Gout Placement, improving  -Denies any urinary symptoms. Initial UA is positive only for trace leukocytes. -UA was repeated and showed a hazy appearance with small hemoglobin, large leukocytes, negative nitrites, many bacteria, 21-50 WBCs, 0-5 red blood cells per high-power field, and repeat urine culture showed no growth but patient had already been on antibiotics -MRSA PCR was negative -CT Abd/Pelvis showed "Patchy airspace opacity seen at both lung bases which is non-specific could be related to early infectious etiology and/or atelectasis. Mild inflammatory changes surrounding the right kidney which could be due to pyelonephritis. No right-sided renal or collecting system calculi. Aortic Atherosclerosis." -Patient is afebrile, tachycardic (HR still on the faster side), tachypneic, leukocytosis of 20.3-->Trended up to 28.5 yesterday and worsened to 36.6 Today but ?  Related to Steroids; now WBC is trended down to 30,600 -Chest x-ray was negative but CT did show  patchy airspace opacites; states that she has a mild cough but is nonproductive -Right foot x-ray: Negative.   -COVID-19 negative, lipase: WNL.  -Patient does not have chronic leukocytosis although she is on prednisone at home.   -Check Lactic Acid and blood culture and procalcitonin level. -Patient has feet pain, worsening kidney function along with anemia-multiple myeloma could be consideration?. -Patient is obese with limited mobility due to feet pain with history of lupus-she is on intermediate risk for PE however could not get CT angiogram due to worsening kidney function and D-dimer will be non specific due to worsening kidney function. -We will get Doppler ultrasound of bilateral lower extremity to rule out DVT and showed no evidence of DVT but was limited   -Started patient on broad-spectrum antibiotics IV Vanco and cefepime now.  Follow blood cultures and showed NGTD at 3Days -Patient states that she had some nausea day before yesterday and some abdominal pain yesterday but this is resolved now. -If persists may consider repeating a CT abdomen pelvis  Bilateral Feet Pain, improving  -Unknown etiology?,  Had a history of gout in the past. -We will check uric acid, CRP, sed rate; LDH was 238, Uric Acid was 11.5, and ESR was <4; Repeat  -Avoid NSAIDs; May need a Larger Prednisone Taper but will need to address Infection first and she is on Stress Dose Steroids with Hydrocortisone 100 mg IV q8h and now changed to po Prednisone 40 mg by Nephrology  -Dilaudid as needed for pain control -May need Colchicine  -Resume Pain Meds as below   AKI on CKD stage IV; improving  Metabolic Acidosis, improving  -Likely secondary from underlying infection and nephrology feels presumably due to ischemic ATN in setting of sepsis and pyelonephritis with a solitary functioning kidney do not feel that this is consistent with a lupus flare -Patient is BUN/creatinine went from 51/4.42 -> 45/4.09 -> 44/4.20 ->  40/3.39 -> 37/2.92 -Patient's CO2 was 13, anion gap was 14, and chloride levels 111; Now AG is 10, CO2 is 22, and Chloride is 119 -Nephrology started the patient on sodium bicarbonate infusion with 150 mEq at 75 mL's per hour and will continue per their Recommendations -Patient has history of left nephrectomy due to renal cell carcinoma. -Potassium: WNL and was 4.0 today   -Renal ultrasound showed "Mild increased renal echogenicity. No hydronephrosis or shadowing stone."  -Avoid nephrotoxic medications as well as contrast dyes and patient is currently having her furosemide held [facility] nephrology for further evaluation and management.   -Per Nephrology no indication for HD at this time and Dr. Marval Regal recommending continuing Abx and gentle IVF -Repeat CMP in AM   Microcytic Anemia/Anemia of Chronic Kidney Disease  -Has a history of warm hemolytic anemia but her T bili is normal and LDH is elevated above normal at 238 and now down to 176 -Patient's Hb/Hct went from 8.9/29.1 -> 8.7/20.0 -> 9.4/30.3 -> 8.3/25.0 -> 7.6/23.1 -T Bili today was 0.6 -Initial Anemia Panel and showed an iron level of 14, U IBC of 172, TIBC of 186, saturation ratios of 8%, ferritin level 371 -Repeat showed an iron level of 107, U IBC of 62, TIBC 169, saturation ratios of 63%, ferritin of 355 -Allergy considering giving the patient ESA -No signs of active bleeding, monitor H&H closely. -We will check iron studies.  Hypomagnesemia -Replaced and improved and Mag Level went from  1.4 -> 2.2 -> 1.9 -> 1.7 -Continue to Monitor and Replete as Necessary -Repeat Mag Level in AM   Lupus, currently not in flare -Mycophenolate 500 mg po BID held by Nephrology initially; Will defer to them to Resume -Prednisone 5 mg po Daily held for Stress Dose Steroids of -C/w Hydroxychloroquine 200 mg po BID -Checking Complement Serologies as well ANA/dsDNA to asses for Lupus Flare and this was negative -Dr. Hollie Salk also checking  UP/C but has "Dirty U/A"  Hypertension  -On Amlodipine at home but this was held due to Sepsis  -Monitor her blood pressure closely. -Last BP was 127/72  Depression/Anxiety -C/w Trazodone 50 mg po Daily and with Duloxetine 60 mg po BID   GERD -C/w Pantoprazole 40 mg pO BID   Chronic Pain Syndrome -On Hydrocodone-Acetaminophen 1 tab po q8hprn Moderate pain, Duloxetine 60 mg po BID, Pregabalin 75 mg po TID -Also have Hydromorphone 1 mg IV q4hprn Severe Pain  -Will also resume Tizanidine 6 mg po BIDprn Muscle Spasms  Obesity -Estimated body mass index is 34.11 kg/m as calculated from the following:   Height as of this encounter: '5\' 2"'  (1.575 m).   Weight as of this encounter: 84.6 kg. -Weight Loss and Dietary Counseling given   DVT prophylaxis: Heparin 5,000 units sq q8h Code Status: FULL CODE  Family Communication: No family present at bedside  Disposition Plan: Pending further improvement back to baseline and evaluation by PT/OT; Refusing SNF   Consultants:   Nephrology  Procedures: None   Antimicrobials:  Anti-infectives (From admission, onward)   Start     Dose/Rate Route Frequency Ordered Stop   01/14/19 0600  vancomycin (VANCOCIN) IVPB 1000 mg/200 mL premix     1,000 mg 200 mL/hr over 60 Minutes Intravenous Every 48 hours 01/12/19 1041     01/12/19 0445  vancomycin (VANCOCIN) 1,500 mg in sodium chloride 0.9 % 500 mL IVPB     1,500 mg 250 mL/hr over 120 Minutes Intravenous NOW 01/12/19 0426 01/12/19 0750   01/11/19 2200  hydroxychloroquine (PLAQUENIL) tablet 200 mg     200 mg Oral 2 times daily 01/11/19 2105     01/11/19 1830  vancomycin (VANCOCIN) 1,500 mg in sodium chloride 0.9 % 500 mL IVPB  Status:  Discontinued     1,500 mg 250 mL/hr over 120 Minutes Intravenous  Once 01/11/19 1817 01/12/19 0426   01/11/19 1830  ceFEPIme (MAXIPIME) 2 g in sodium chloride 0.9 % 100 mL IVPB     2 g 200 mL/hr over 30 Minutes Intravenous Every 24 hours 01/11/19 1817      01/11/19 1818  vancomycin variable dose per unstable renal function (pharmacist dosing)      Does not apply See admin instructions 01/11/19 1818     01/10/19 1845  cefTRIAXone (ROCEPHIN) 1 g in sodium chloride 0.9 % 100 mL IVPB     1 g 200 mL/hr over 30 Minutes Intravenous  Once 01/10/19 1835 01/10/19 2004     Subjective: Seen and examined at bedside and and think she is doing better.  Had some abdominal pain yesterday but none today.  No nausea or vomiting.  Still complaining of some bilateral feet pain but states is getting better and thinks she is doing better.  Still has some tremors.  No other concerns complaints at this time.   Objective: Vitals:   01/13/19 1300 01/13/19 2219 01/14/19 0523 01/14/19 0816  BP: (!) 133/100 (!) 134/105 127/72   Pulse: 99 96  Resp: (!) 23  (!) 21   Temp: 98.3 F (36.8 C) 98.5 F (36.9 C) 98.3 F (36.8 C)   TempSrc: Oral Oral Oral   SpO2: 95% 100% 100% 100%  Weight:   84.6 kg   Height:        Intake/Output Summary (Last 24 hours) at 01/14/2019 1700 Last data filed at 01/14/2019 0913 Gross per 24 hour  Intake 420 ml  Output 500 ml  Net -80 ml   Filed Weights   01/10/19 1144 01/13/19 0403 01/14/19 0523  Weight: 75.8 kg 82.6 kg 84.6 kg   Examination: Physical Exam:  Constitutional: WN/WD obese African-American female currently in NAD and appears calm and comfortable Eyes: Lids and conjunctivae normal, sclerae anicteric  ENMT: External Ears, Nose appear normal. Grossly normal hearing.  Neck: Appears normal, supple, no cervical masses, normal ROM, no appreciable thyromegaly; no JVD Respiratory: Diminished to auscultation bilaterally, no wheezing, rales, rhonchi or crackles. Normal respiratory effort and patient is not tachypenic. No accessory muscle use. Unlabored breathing  Cardiovascular: RRR, no murmurs / rubs / gallops. S1 and S2 auscultated. Trace LE Edema Abdomen: Soft, non-tender, Distended due to body habitus. No masses palpated. No  appreciable hepatosplenomegaly. Bowel sounds positive x4.  GU: Deferred. Musculoskeletal: No clubbing / cyanosis of digits/nails.  Normal strength and muscle tone.  Skin: Has some lower extremity discoloration from her lupus bilaterally but no appreciable rashes or lesions on the skin evaluation. No induration; Warm and dry.  Neurologic: CN 2-12 grossly intact with no focal deficits but does have some slight tremors and shakes. Psychiatric: Normal judgment and insight. Alert and oriented x 3.  Pleasant mood and appropriate affect.    Data Reviewed: I have personally reviewed following labs and imaging studies  CBC: Recent Labs  Lab 01/10/19 1549 01/11/19 1648 01/12/19 0207 01/13/19 1021 01/14/19 0135  WBC 20.3* 28.5* 36.6* 31.9* 30.6*  NEUTROABS 15.9* 27.1*  --  30.6* 29.2*  HGB 8.9* 8.7* 9.4* 8.3* 7.6*  HCT 29.1* 28.0* 30.3* 25.0* 23.1*  MCV 73.1* 73.5* 73.7* 68.9* 68.5*  PLT 199 206 233 257 622   Basic Metabolic Panel: Recent Labs  Lab 01/10/19 1549 01/11/19 0631 01/11/19 1648 01/12/19 0207 01/13/19 0215 01/14/19 0135  NA 141  --  140 138 139 138  K 4.1  --  4.6 4.9 4.8 4.0  CL 113*  --  113* 111 113* 106  CO2 18*  --  14* 13* 14* 22  GLUCOSE 75  --  76 90 110* 119*  BUN 51*  --  45* 44* 40* 37*  CREATININE 4.42*  --  4.09* 4.20* 3.39* 2.92*  CALCIUM 7.6*  --  7.7* 8.3* 8.4* 8.1*  MG 1.2* 1.4*  --  2.2 1.9 1.7  PHOS  --   --   --   --  3.8 3.3   GFR: Estimated Creatinine Clearance: 20.2 mL/min (A) (by C-G formula based on SCr of 2.92 mg/dL (H)). Liver Function Tests: Recent Labs  Lab 01/10/19 1549 01/11/19 1648 01/13/19 0215 01/14/19 0135  AST 17 13* 16 14*  ALT '20 17 16 14  ' ALKPHOS 91 99 95 86  BILITOT 0.8 0.7 0.6 0.6  PROT 5.9* 5.5* 5.6* 5.4*  ALBUMIN 3.0* 2.4* 2.6*  2.6* 2.4*  2.4*   Recent Labs  Lab 01/10/19 1549  LIPASE 16   No results for input(s): AMMONIA in the last 168 hours. Coagulation Profile: No results for input(s): INR, PROTIME in  the last 168 hours. Cardiac  Enzymes: Recent Labs  Lab 01/10/19 1549  CKTOTAL 42   BNP (last 3 results) No results for input(s): PROBNP in the last 8760 hours. HbA1C: No results for input(s): HGBA1C in the last 72 hours. CBG: No results for input(s): GLUCAP in the last 168 hours. Lipid Profile: No results for input(s): CHOL, HDL, LDLCALC, TRIG, CHOLHDL, LDLDIRECT in the last 72 hours. Thyroid Function Tests: No results for input(s): TSH, T4TOTAL, FREET4, T3FREE, THYROIDAB in the last 72 hours. Anemia Panel: Recent Labs    01/12/19 1030 01/14/19 0135  FERRITIN 371* 355*  TIBC 186* 169*  IRON 14* 107   Sepsis Labs: Recent Labs  Lab 01/11/19 1802 01/11/19 1807 01/11/19 2004  PROCALCITON 3.53  --   --   LATICACIDVEN  --  0.9 1.1    Recent Results (from the past 240 hour(s))  SARS Coronavirus 2 Ag (30 min TAT) - Nasal Swab (BD Veritor Kit)     Status: None   Collection Time: 01/10/19  3:49 PM   Specimen: Nasal Swab (BD Veritor Kit)  Result Value Ref Range Status   SARS Coronavirus 2 Ag NEGATIVE NEGATIVE Final    Comment: (NOTE) SARS-CoV-2 antigen NOT DETECTED.  Negative results are presumptive.  Negative results do not preclude SARS-CoV-2 infection and should not be used as the sole basis for treatment or other patient management decisions, including infection  control decisions, particularly in the presence of clinical signs and  symptoms consistent with COVID-19, or in those who have been in contact with the virus.  Negative results must be combined with clinical observations, patient history, and epidemiological information. The expected result is Negative. Fact Sheet for Patients: PodPark.tn Fact Sheet for Healthcare Providers: GiftContent.is This test is not yet approved or cleared by the [place] FDA and  has been authorized for detection and/or diagnosis of SARS-CoV-2 by FDA under an Emergency Use  Authorization (EUA).  This EUA will remain in effect (meaning this test can be used) for the duration of  the COVID-19 de claration under Section 564(b)(1) of the Act, 21 U.S.C. section 360bbb-3(b)(1), unless the authorization is terminated or revoked sooner. Performed at [facility], [address] [place], [place] [address]   SARS CORONAVIRUS 2 (TAT 6-24 HRS) Nasopharyngeal Urine, Catheterized     Status: None   Collection Time: 01/10/19  6:03 PM   Specimen: Urine, Catheterized; Nasopharyngeal  Result Value Ref Range Status   SARS Coronavirus 2 NEGATIVE NEGATIVE Final    Comment: (NOTE) SARS-CoV-2 target nucleic acids are NOT DETECTED. The SARS-CoV-2 RNA is generally detectable in upper and lower respiratory specimens during the acute phase of infection. Negative results do not preclude SARS-CoV-2 infection, do not rule out co-infections with other pathogens, and should not be used as the sole basis for treatment or other patient management decisions. Negative results must be combined with clinical observations, patient history, and epidemiological information. The expected result is Negative. Fact Sheet for Patients: SugarRoll.be Fact Sheet for Healthcare Providers: https://www.woods-mathews.com/ This test is not yet approved or cleared by the [place] FDA and  has been authorized for detection and/or diagnosis of SARS-CoV-2 by FDA under an Emergency Use Authorization (EUA). This EUA will remain  in effect (meaning this test can be used) for the duration of the COVID-19 declaration under Section 56 4(b)(1) of the Act, 21 U.S.C. section 360bbb-3(b)(1), unless the authorization is terminated or revoked sooner. Performed at [facility], [place] [address], [place], [place] [address]  Culture, blood (routine x 2)     Status: None (Preliminary result)   Collection Time: 01/11/19  6:02 PM   Specimen: BLOOD RIGHT HAND   Result Value Ref Range Status   Specimen Description BLOOD RIGHT HAND  Final   Special Requests   Final    BOTTLES DRAWN AEROBIC ONLY Blood Culture results may not be optimal due to an inadequate volume of blood received in culture bottles   Culture   Final    NO GROWTH 3 DAYS Performed at [facility], [place] [address], [place], [place] [address]    Report Status PENDING  Incomplete  Culture, blood (routine x 2)     Status: None (Preliminary result)   Collection Time: 01/11/19  6:07 PM   Specimen: BLOOD LEFT HAND  Result Value Ref Range Status   Specimen Description BLOOD LEFT HAND  Final   Special Requests   Final    BOTTLES DRAWN AEROBIC ONLY Blood Culture results may not be optimal due to an inadequate volume of blood received in culture bottles   Culture   Final    NO GROWTH 3 DAYS Performed at [facility], [place] [address], [place], [place] [address]    Report Status PENDING  Incomplete  Urine culture     Status: None   Collection Time: 01/12/19  5:10 PM   Specimen: Urine, Clean Catch  Result Value Ref Range Status   Specimen Description URINE, CLEAN CATCH  Final   Special Requests Immunocompromised  Final   Culture   Final    NO GROWTH Performed at [facility], [place] [address] [place], [place] [address]    Report Status 01/13/2019 FINAL  Final  MRSA PCR Screening     Status: None   Collection Time: 01/12/19 11:05 PM   Specimen: Nasopharyngeal  Result Value Ref Range Status   MRSA by PCR NEGATIVE NEGATIVE Final    Comment:        The GeneXpert MRSA Assay (FDA approved for NASAL specimens only), is one component of a comprehensive MRSA colonization surveillance program. It is not intended to diagnose MRSA infection nor to guide or monitor treatment for MRSA infections. Performed at [facility], [place] [address] [place], [place] [address]     Radiology Studies: No results found. Scheduled Meds: . darbepoetin (ARANESP) injection -  NON-DIALYSIS  150 mcg Subcutaneous Q Fri-1800  . DULoxetine  60 mg Oral BID  . heparin  5,000 Units Subcutaneous Q8H  . hydroxychloroquine  200 mg Oral BID  . levothyroxine  25 mcg Oral Q0600  . lidocaine  1 patch Transdermal Q24H  . pantoprazole  40 mg Oral BID  . [START ON 01/15/2019] predniSONE  40 mg Oral Q breakfast  . pregabalin  75 mg Oral BID  . traZODone  50 mg Oral Daily  . vancomycin variable dose per unstable renal function (pharmacist dosing)   Does not apply See admin instructions   Continuous Infusions: . ceFEPime (MAXIPIME) IV 2 g (01/13/19 1852)  .  sodium bicarbonate (isotonic) infusion in sterile water 75 mL/hr at 01/14/19 1351  . vancomycin 1,000 mg (01/14/19 0556)    LOS: 3 days   Kerney Elbe, DO [facility] PAGER is on [place]  If 7PM-7AM, please contact night-coverage [email or web address]

## 2019-01-14 NOTE — Progress Notes (Signed)
Patient ID: Leanah Kolander, female   DOB: 1955-03-01, 63 y.o.   MRN: [id number] [facility] Progress Note   Assessment/ Plan:   1. Acute kidney Injury on chronic kidney disease stage IV patient with solitary kidney: Likely ATN associated with sepsis/pyelonephritis.  No evidence of lupus flare based on complement/double-stranded DNA levels.  Continue supportive management with intravenous fluids and begin tapering down corticosteroids. 2.  Urinary tract infection with sepsis: Clinically appears to be improving with ongoing antibiotics/fluids. 3.  Anion gap metabolic acidosis: Secondary to acute kidney injury, improving with supportive management/fluids. 4.  Acute right foot pain/podagra: Suspected to be clinically from acute gout flare, improving with stress dose steroids. 5.  Systemic lupus erythematosus: Continue CellCept, hydroxychloroquine and begin weaning down corticosteroids to prednisone 5 mg daily that she is to take as an outpatient. 6.  Anemia of chronic kidney disease: Iron saturation appears to be satisfactory, will begin ESA   Subjective:   Reports to be feeling better, leg pain considerably improved.  Denies chest pain or shortness of breath.   Objective:   BP 127/72 (BP Location: Right Wrist)   Pulse 96   Temp 98.3 F (36.8 C) (Oral)   Resp (!) 21   Ht 5\' 2"  (1.575 m)   Wt 84.6 kg   SpO2 100%   BMI 34.11 kg/m   Intake/Output Summary (Last 24 hours) at 01/14/2019 1046 Last data filed at 01/14/2019 0913 Gross per 24 hour  Intake 360 ml  Output 500 ml  Net -140 ml   Weight change: 2 kg  Physical Exam: Gen: Comfortably resting in bed CVS: Pulse regular rhythm, normal rate, S1 and S2 with ejection systolic murmur Resp: Diminished breath sounds over bases, no rales/rhonchi Abd: Soft, obese, nontender Ext: Trace lower extremity edema with tenderness around ankles  Imaging: No results found.  Labs: BMET Recent Labs  Lab 01/10/19 1549  01/11/19 1648 01/12/19 0207 01/13/19 0215 01/14/19 0135  NA 141 140 138 139 138  K 4.1 4.6 4.9 4.8 4.0  CL 113* 113* 111 113* 106  CO2 18* 14* 13* 14* 22  GLUCOSE 75 76 90 110* 119*  BUN 51* 45* 44* 40* 37*  CREATININE 4.42* 4.09* 4.20* 3.39* 2.92*  CALCIUM 7.6* 7.7* 8.3* 8.4* 8.1*  PHOS  --   --   --  3.8 3.3   CBC Recent Labs  Lab 01/10/19 1549 01/11/19 1648 01/12/19 0207 01/13/19 1021 01/14/19 0135  WBC 20.3* 28.5* 36.6* 31.9* 30.6*  NEUTROABS 15.9* 27.1*  --  30.6* 29.2*  HGB 8.9* 8.7* 9.4* 8.3* 7.6*  HCT 29.1* 28.0* 30.3* 25.0* 23.1*  MCV 73.1* 73.5* 73.7* 68.9* 68.5*  PLT 199 206 233 257 270    Medications:    . DULoxetine  60 mg Oral BID  . heparin  5,000 Units Subcutaneous Q8H  . hydrocortisone sod succinate (SOLU-CORTEF) inj  100 mg Intravenous Q8H  . hydroxychloroquine  200 mg Oral BID  . levothyroxine  25 mcg Oral Q0600  . lidocaine  1 patch Transdermal Q24H  . pantoprazole  40 mg Oral BID  . pregabalin  75 mg Oral BID  . traZODone  50 mg Oral Daily  . vancomycin variable dose per unstable renal function (pharmacist dosing)   Does not apply See admin instructions   Elmarie Shiley, MD 01/14/2019, 10:46 AM

## 2019-01-14 NOTE — Progress Notes (Signed)
The patient has severe extravasation (the skin on her left arm is tight/swollen/red). She is currently denying pain in that area. She received vancomycin in the evening around about 1700.  The IV team nurse has been consulted and the provider on call has been made aware.  Will ice and elevate extremity and continue to monitor.

## 2019-01-15 ENCOUNTER — Inpatient Hospital Stay (HOSPITAL_COMMUNITY): Payer: Medicare Other

## 2019-01-15 LAB — COMPREHENSIVE METABOLIC PANEL
ALT: 15 U/L (ref 0–44)
ALT: 16 U/L (ref 0–44)
AST: 15 U/L (ref 15–41)
AST: 18 U/L (ref 15–41)
Albumin: 2.4 g/dL — ABNORMAL LOW (ref 3.5–5.0)
Albumin: 2.9 g/dL — ABNORMAL LOW (ref 3.5–5.0)
Alkaline Phosphatase: 79 U/L (ref 38–126)
Alkaline Phosphatase: 93 U/L (ref 38–126)
Anion gap: 11 (ref 5–15)
Anion gap: 15 (ref 5–15)
BUN: 38 mg/dL — ABNORMAL HIGH (ref 8–23)
BUN: 34 mg/dL — ABNORMAL HIGH (ref 8–23)
CO2: 29 mmol/L (ref 22–32)
CO2: 27 mmol/L (ref 22–32)
Calcium: 7.8 mg/dL — ABNORMAL LOW (ref 8.9–10.3)
Calcium: 8.6 mg/dL — ABNORMAL LOW (ref 8.9–10.3)
Chloride: 101 mmol/L (ref 98–111)
Chloride: 100 mmol/L (ref 98–111)
Creatinine, Ser: 2.7 mg/dL — ABNORMAL HIGH (ref 0.44–1.00)
Creatinine, Ser: 2.83 mg/dL — ABNORMAL HIGH (ref 0.44–1.00)
GFR calc Af Amer: 21 mL/min — ABNORMAL LOW (ref >60)
GFR calc Af Amer: 20 mL/min — ABNORMAL LOW (ref >60)
GFR calc non Af Amer: 18 mL/min — ABNORMAL LOW (ref >60)
GFR calc non Af Amer: 17 mL/min — ABNORMAL LOW (ref >60)
Glucose, Bld: 72 mg/dL (ref 70–99)
Glucose, Bld: 105 mg/dL — ABNORMAL HIGH (ref 70–99)
Potassium: 3 mmol/L — ABNORMAL LOW (ref 3.5–5.1)
Potassium: 3.6 mmol/L (ref 3.5–5.1)
Sodium: 141 mmol/L (ref 135–145)
Sodium: 142 mmol/L (ref 135–145)
Total Bilirubin: 0.6 mg/dL (ref 0.3–1.2)
Total Bilirubin: 0.9 mg/dL (ref 0.3–1.2)
Total Protein: 5.1 g/dL — ABNORMAL LOW (ref 6.5–8.1)
Total Protein: 6.4 g/dL — ABNORMAL LOW (ref 6.5–8.1)

## 2019-01-15 LAB — CBC WITH DIFFERENTIAL/PLATELET
Abs Immature Granulocytes: 0.24 10*3/uL — ABNORMAL HIGH (ref 0.00–0.07)
Abs Immature Granulocytes: 0.13 10*3/uL — ABNORMAL HIGH (ref 0.00–0.07)
Basophils Absolute: 0 10*3/uL (ref 0.0–0.1)
Basophils Absolute: 0 10*3/uL (ref 0.0–0.1)
Basophils Relative: 0 %
Basophils Relative: 0 %
Eosinophils Absolute: 0 10*3/uL (ref 0.0–0.5)
Eosinophils Absolute: 0 10*3/uL (ref 0.0–0.5)
Eosinophils Relative: 0 %
Eosinophils Relative: 0 %
HCT: 23.8 % — ABNORMAL LOW (ref 36.0–46.0)
HCT: 28.2 % — ABNORMAL LOW (ref 36.0–46.0)
Hemoglobin: 7.7 g/dL — ABNORMAL LOW (ref 12.0–15.0)
Hemoglobin: 9.1 g/dL — ABNORMAL LOW (ref 12.0–15.0)
Immature Granulocytes: 1 %
Immature Granulocytes: 1 %
Lymphocytes Relative: 15 %
Lymphocytes Relative: 4 %
Lymphs Abs: 2.7 10*3/uL (ref 0.7–4.0)
Lymphs Abs: 0.6 10*3/uL — ABNORMAL LOW (ref 0.7–4.0)
MCH: 22.4 pg — ABNORMAL LOW (ref 26.0–34.0)
MCH: 22.6 pg — ABNORMAL LOW (ref 26.0–34.0)
MCHC: 32.4 g/dL (ref 30.0–36.0)
MCHC: 32.3 g/dL (ref 30.0–36.0)
MCV: 69.2 fL — ABNORMAL LOW (ref 80.0–100.0)
MCV: 70 fL — ABNORMAL LOW (ref 80.0–100.0)
Monocytes Absolute: 1.1 10*3/uL — ABNORMAL HIGH (ref 0.1–1.0)
Monocytes Absolute: 0.3 10*3/uL (ref 0.1–1.0)
Monocytes Relative: 6 %
Monocytes Relative: 3 %
Neutro Abs: 13.2 10*3/uL — ABNORMAL HIGH (ref 1.7–7.7)
Neutro Abs: 12.3 10*3/uL — ABNORMAL HIGH (ref 1.7–7.7)
Neutrophils Relative %: 78 %
Neutrophils Relative %: 92 %
Platelets: 329 10*3/uL (ref 150–400)
Platelets: 371 10*3/uL (ref 150–400)
RBC: 3.44 MIL/uL — ABNORMAL LOW (ref 3.87–5.11)
RBC: 4.03 MIL/uL (ref 3.87–5.11)
RDW: 14.4 % (ref 11.5–15.5)
RDW: 14.6 % (ref 11.5–15.5)
WBC: 17.2 10*3/uL — ABNORMAL HIGH (ref 4.0–10.5)
WBC: 13.4 10*3/uL — ABNORMAL HIGH (ref 4.0–10.5)
nRBC: 0.3 % — ABNORMAL HIGH (ref 0.0–0.2)
nRBC: 0.1 % (ref 0.0–0.2)

## 2019-01-15 LAB — MAGNESIUM
Magnesium: 1.5 mg/dL — ABNORMAL LOW (ref 1.7–2.4)
Magnesium: 2.3 mg/dL (ref 1.7–2.4)

## 2019-01-15 LAB — RENAL FUNCTION PANEL
Albumin: 2.3 g/dL — ABNORMAL LOW (ref 3.5–5.0)
Anion gap: 11 (ref 5–15)
BUN: 35 mg/dL — ABNORMAL HIGH (ref 8–23)
CO2: 30 mmol/L (ref 22–32)
Calcium: 7.8 mg/dL — ABNORMAL LOW (ref 8.9–10.3)
Chloride: 99 mmol/L (ref 98–111)
Creatinine, Ser: 2.74 mg/dL — ABNORMAL HIGH (ref 0.44–1.00)
GFR calc Af Amer: 21 mL/min — ABNORMAL LOW (ref >60)
GFR calc non Af Amer: 18 mL/min — ABNORMAL LOW (ref >60)
Glucose, Bld: 73 mg/dL (ref 70–99)
Phosphorus: 2.6 mg/dL (ref 2.5–4.6)
Potassium: 3 mmol/L — ABNORMAL LOW (ref 3.5–5.1)
Sodium: 140 mmol/L (ref 135–145)

## 2019-01-15 LAB — PROCALCITONIN: Procalcitonin: 0.62 ng/mL

## 2019-01-15 LAB — TSH: TSH: 3.797 u[IU]/mL (ref 0.350–4.500)

## 2019-01-15 LAB — PHOSPHORUS
Phosphorus: 2.7 mg/dL (ref 2.5–4.6)
Phosphorus: 2.2 mg/dL — ABNORMAL LOW (ref 2.5–4.6)

## 2019-01-15 LAB — TROPONIN I (HIGH SENSITIVITY)
Troponin I (High Sensitivity): 8 ng/L (ref <18)
Troponin I (High Sensitivity): 8 ng/L (ref <18)

## 2019-01-15 MED ORDER — POTASSIUM CHLORIDE CRYS ER 20 MEQ PO TBCR
40.0000 meq | EXTENDED_RELEASE_TABLET | Freq: Two times a day (BID) | ORAL | Status: AC
  Administered 2019-01-15: 40 meq via ORAL
  Filled 2019-01-15 (×3): qty 2

## 2019-01-15 MED ORDER — MAGNESIUM SULFATE 2 GM/50ML IV SOLN
2.0000 g | Freq: Once | INTRAVENOUS | Status: AC
  Administered 2019-01-15: 2 g via INTRAVENOUS
  Filled 2019-01-15: qty 50

## 2019-01-15 MED ORDER — SODIUM CHLORIDE 0.9 % IV SOLN
INTRAVENOUS | Status: DC
  Administered 2019-01-15: 21:00:00 via INTRAVENOUS

## 2019-01-15 MED ORDER — PROCHLORPERAZINE EDISYLATE 10 MG/2ML IJ SOLN
10.0000 mg | Freq: Four times a day (QID) | INTRAMUSCULAR | Status: DC | PRN
  Administered 2019-01-16: 10 mg via INTRAVENOUS
  Filled 2019-01-15: qty 2

## 2019-01-15 MED ORDER — SODIUM CHLORIDE 0.9 % IV BOLUS
250.0000 mL | Freq: Once | INTRAVENOUS | Status: AC
  Administered 2019-01-15: 250 mL via INTRAVENOUS

## 2019-01-15 MED ORDER — POLYETHYLENE GLYCOL 3350 17 G PO PACK
17.0000 g | PACK | Freq: Two times a day (BID) | ORAL | Status: DC
  Administered 2019-01-19 – 2019-01-22 (×5): 17 g via ORAL
  Filled 2019-01-15 (×7): qty 1

## 2019-01-15 MED ORDER — SENNOSIDES-DOCUSATE SODIUM 8.6-50 MG PO TABS
1.0000 | ORAL_TABLET | Freq: Two times a day (BID) | ORAL | Status: DC
  Administered 2019-01-19 – 2019-01-22 (×6): 1 via ORAL
  Filled 2019-01-15 (×8): qty 1

## 2019-01-15 NOTE — Progress Notes (Signed)
Pt stated that she feels better with less nausea and abdominal pain.

## 2019-01-15 NOTE — Progress Notes (Signed)
CT transport returning with pt stating they did not do CT scan since pt was on bedpan. Pt removed from bedpan, cleaned up and CT was called.

## 2019-01-15 NOTE — Progress Notes (Signed)
Pharmacy Antibiotic Note  Carolyn Zamora is a 63 y.o. female admitted from [facility] on 01/10/2019 with foot pain and worsening renal function.  Pharmacy consulted 01/12/19 for vancomycin and cefepime dosing for sepsis.   Vancomycin discontinued today 01/15/19. RN noted 12/4 PM RN noted severe extravasation (the skin on her left arm is tight/swollen/red).   Sepsis on immunosuppressive therapy. Afebrile. Tm 99.1  Leukocytosis WBC 30.6 (on IV steroids) trending down to 17.2 today.   CRP 20.3, PC 3.53 (12/1), Scr down 2.92>2.7. CrCl ~ 21.8 ml/min. Blood cultures no growth to date.   Medical history includes: renal cell carcinoma (S/P left nephrectomy), history of splenectomy, AKI on CKD stage III, chronic pain syndrome, HTN, morbid obesity, lupus-on chronic immunosuppressive therapy (mycophenolate, hydroxychloroquine, prednisone), hypertension, anemia of chronic disease.  Plan: Continue Cefepime 2 gm IV Q 24 hrs Vancomycin discontinued today 01/15/19. Monitor renal function, WBC, temp, clinical improvement, cultures.    Height: 5\' 2"  (157.5 cm) Weight: 192 lb 0.3 oz (87.1 kg) IBW/kg (Calculated) : 50.1  Temp (24hrs), Avg:98.5 F (36.9 C), Min:98.2 F (36.8 C), Max:99.1 F (37.3 C)  Recent Labs  Lab 01/11/19 1648 01/11/19 1807 01/11/19 2004 01/12/19 0207 01/12/19 0219 01/13/19 0215 01/13/19 1021 01/14/19 0135 01/15/19 0234  WBC 28.5*  --   --  36.6*  --   --  31.9* 30.6* 17.2*  CREATININE 4.09*  --   --  4.20*  --  3.39*  --  2.92* 2.70*  2.74*  LATICACIDVEN  --  0.9 1.1  --   --   --   --   --   --   VANCORANDOM  --   --   --   --  <4  --   --   --   --     Estimated Creatinine Clearance: 21.8 mL/min (A) (by C-G formula based on SCr of 2.74 mg/dL (H)).    No Known Allergies  Antimicrobials this admission: Vanc 12/2>>12/5 Cefepime 12/1>> Roceph 11/30 x 1  Microbiology results: 12/1: BC x 2>>ngtd 3d 11/30: COVID negative 12/2: MRSA PCR: negative 12/2:  UCx negative (after abx started)  Thank you for allowing pharmacy to be a part of this patient's care.  Nicole Cella, RPh Clinical Pharmacist Please check AMION for all [facility] phone numbers After 10:00 PM, call [facility] [phone] 01/15/2019 8:37 AM

## 2019-01-15 NOTE — Progress Notes (Signed)
   01/15/19 1726  Vitals  Temp (!) 97.5 F (36.4 C)  Temp Source Axillary  BP (!) 146/79  MAP (mmHg) 100  BP Location Left Arm  BP Method Automatic  Patient Position (if appropriate) Lying  Pulse Rate 86  Pulse Rate Source Dinamap  Resp 16  Level of Consciousness  Level of Consciousness Alert  Oxygen Therapy  SpO2 100 %  O2 Device Room Air  MEWS Score  MEWS RR 0  MEWS Pulse 0  MEWS Systolic 0  MEWS LOC 0  MEWS Temp 0  MEWS Score 0  MEWS Score Color Green

## 2019-01-15 NOTE — Progress Notes (Signed)
Patient complaining of abdominal pain and nausea. She is dry-heaving. Gave patient Zofran without relief. She is diaphoretic to touch. Vitals stable. She is saying, "I'm sick" repeatedly, but is not oriented enough to describe her pain or what is wrong. 12-lead EKG done and MD paged. See vitals orders. Will continue to monitor.

## 2019-01-15 NOTE — Progress Notes (Signed)
PROGRESS NOTE    Carolyn Zamora  ZYY:[id number] DOB: 11-10-1955 DOA: 01/10/2019 PCP: Beckie Salts, MD   Brief Narrative:  HPI per Dr. Early Osmond on 01/11/2019 Carolyn Zamora is a 63 y.o. female with medical history significant of renal cell carcinoma status post left nephrectomy, history of splenectomy, CKD stage III, chronic pain syndrome, morbid obesity, lupus-on chronic immunosuppressive therapy (mycophenolate, hydroxychloroquine, prednisone), hypertension, anemia of chronic disease came from [place] for the concern of sepsis secondary to UTI and worsening kidney function.  Patient tells me that she has feet pain which is severe in intensity since 5 days.  Unable to walk/move, could not sleep at night, no aggravating or relieving factors, associated with chills denies association with trauma, fever, decreased appetite, generalized weakness or lethargy.  Had history of gout however currently not on any medicines.  X-ray of right foot obtained at [facility]-which came back negative.  She reports lower abdominal pain and chronic back pain however denies urinary symptoms such as dysuria, hematuria, foul-smelling urine, nocturia, nausea, vomiting.  She lives alone at home and her daughter and sister helps her.  No history of smoking, alcohol, illicit drug use.  At [facility]: Patient was tachycardic, tachypneic, leukocytosis of 20.3, UA positive for small leukocytes.  CK, lipase level: WNL, COVID-19 negative, magnesium: Low-- was replaced.  CMP shows worsening kidney function.  Patient received IV fluids and Rocephin for the concern of urosepsis.  Patient transferred to [facility] for further evaluation and management.  **Interim History Nephrology does not feel that she has a lupus flare but her stress dosing her steroids and checking complement level and serological work-up which was negative for Lupus flare.  WBC elevated likely in the setting of her steroids and is  slowly trending down slightly.  Patient complaining of foot pain still but this is improving after being on Steroids.  No evidence of DVT on lower extremity Dopplers.  Patient's hemoglobin is also trending downward so nephrology will start ESA.  Patient had complained of some foot pain and yesterday her IV infiltrated and had some extravasation of her vancomycin.  IV was removed and applied warm compresses.  IV vancomycin has been stopped given her negative MRSA PCR. PT/OT recommending SNF but Patient refusing and wanting to go Home.  Stress dose steroids have been tapered down to prednisone 40 mg and will be further tapered down to 5 mg prednisone daily to her baseline dose.  Renal function is still improving and nephrology has stopped her fluids today.   Assessment & Plan:   Principal Problem:   Sepsis ([place]) Active Problems:   Hypertension   Systemic lupus erythematosus (HCC)   Chronic pain syndrome   Renal cell carcinoma of left kidney s/p nephrectomy   Anemia of chronic disease   Renal failure (ARF), acute on chronic (HCC)   Pain in both feet   Hypomagnesemia  Sepsis of Unknown Etiology but likely in the setting of Pyelonephritis and Complicated by Gout Placement, improving  -Denies any urinary symptoms. Initial UA is positive only for trace leukocytes. -UA was repeated and showed a hazy appearance with small hemoglobin, large leukocytes, negative nitrites, many bacteria, 21-50 WBCs, 0-5 red blood cells per high-power field, and repeat urine culture showed no growth but patient had already been on antibiotics -MRSA PCR was negative and now I have stopped IV vancomycin.  Unfortunately yesterday her IV infiltrated and she had some extravasation of the vancomycin and now has a swollen left arm -CT Abd/Pelvis  showed "Patchy airspace opacity seen at both lung bases which is non-specific could be related to early infectious etiology and/or atelectasis. Mild inflammatory changes surrounding the  right kidney which could be due to pyelonephritis. No right-sided renal or collecting system calculi. Aortic Atherosclerosis." -Patient is afebrile, tachycardic (HR still on the faster side), tachypneic, leukocytosis of 20.3-->Trended up to 28.5 and worsened to 36.6 but  Related to Steroids; now WBC is trended down and is going from 30,600 and now is further trended down to 17,200 -Chest x-ray was negative but CT did show patchy airspace opacites; states that she has a mild cough but is nonproductive -Right foot x-ray: Negative.   -COVID-19 negative, lipase: WNL.  -Patient does not have chronic leukocytosis although she is on prednisone at home.   -Check Lactic Acid and blood culture and procalcitonin level. -Patient has feet pain, worsening kidney function along with anemia-multiple myeloma could be consideration?. -Patient is obese with limited mobility due to feet pain with history of lupus-she is on intermediate risk for PE however could not get CT angiogram due to worsening kidney function and D-dimer will be non specific due to worsening kidney function. -We will get Doppler ultrasound of bilateral lower extremity to rule out DVT and showed no evidence of DVT but was limited   -Started patient on broad-spectrum antibiotics IV Vanco and cefepime but now stopped the IV vancomycin and will continue IV cefepime for now likely stop the cefepime tomorrow we will have had 5 days of antibiotics.  Follow blood cultures and showed NGTD at 4 Days -Has some intermittent nausea and abdominal pain but this is improving -If persists may consider repeating a CT abdomen pelvis will hold off for now -Continue with physical therapy efforts  Bilateral Feet Pain, improving  -Unknown etiology?,  Had a history of gout in the past. -We will check uric acid, CRP, sed rate; LDH was 238, Uric Acid was 11.5, and ESR was <4; Repeat  -Avoid NSAIDs; May need a Larger Prednisone Taper but will need to address Infection  first and she is on Stress Dose Steroids with Hydrocortisone 100 mg IV q8h and now changed to po Prednisone 40 mg by Nephrology and will continue today and transition to 30 mg p.o. tomorrow -Dilaudid as needed for pain control -May need Colchicine  -Repeat Uric Acid Level in AM  -Resume Pain Meds as below   AKI on CKD stage IV; improving  Metabolic Acidosis, improving  -Likely secondary from underlying infection and nephrology feels presumably due to ischemic ATN in setting of sepsis and pyelonephritis with a solitary functioning kidney do not feel that this is consistent with a lupus flare -Patient is BUN/creatinine went from 51/4.42 -> 45/4.09 -> 44/4.20 -> 40/3.39 -> 37/2.92 -> 35/2.70 -Patient's CO2 was 13, anion gap was 14, and chloride levels 111; Now AG is 11, CO2 is 30, and Chloride is 101 -Nephrology started the patient on sodium bicarbonate infusion with 150 mEq at 75 mL's per hour and will discontinue now -Patient has history of left nephrectomy due to renal cell carcinoma. -Potassium was hypokalemic and potassium was 3.0 and magnesium was 1.5  -Renal ultrasound showed "Mild increased renal echogenicity. No hydronephrosis or shadowing stone."  -Avoid nephrotoxic medications as well as contrast dyes and patient is currently having her furosemide held [facility] nephrology for further evaluation and management. Appreciated Assistance -Per Nephrology no indication for HD at this time and IV fluids have been discontinued by Nephrology -Repeat CMP in AM  Microcytic Anemia/Anemia of Chronic Kidney Disease  -Has a history of warm hemolytic anemia but her T bili is normal and LDH is elevated above normal at 238 and now down to 176 -Patient's Hb/Hct went from 8.9/29.1 -> 8.7/20.0 -> 9.4/30.3 -> 8.3/25.0 -> 7.6/23.1 -> 7.7/23.8 -T Bili today was 0.6 -Initial Anemia Panel and showed an iron level of 14, U IBC of 172, TIBC of 186, saturation ratios of 8%, ferritin level 371 -Repeat anemia  panel showed an iron level of 107, U IBC of 62, TIBC 169, saturation ratios of 63%, ferritin of 355 -Nephrology gave the patient darbepoetin alfa 150 mcg subcu today -No signs of active bleeding, monitor H&H closely. -We will check iron studies.  Hypomagnesemia -Replaced and improved and Mag Level went from 1.4 -> 2.2 -> 1.9 -> 1.7 -> 1.5 -Replete with IV Mag Sulfate 2 Grams -Continue to Monitor and Replete as Necessary -Repeat Mag Level in AM   Lupus, currently not in flare -Mycophenolate 500 mg po BID held by Nephrology initially; Will defer to them to Resume and I spoke to Dr. Posey Pronto of Nephrology and he recommends resuming tomorrow at 500 mg BID -Prednisone 5 mg po Daily held for Stress Dose Steroids but now back on po Prednisone 40 and will taper down to 5 mg in the coming weeks -C/w Hydroxychloroquine 200 mg po BID -Checking Complement Serologies as well ANA/dsDNA to asses for Lupus Flare and this was negative -Nephrology also checking UP/C but has "Dirty U/A"  Hypokalemia -Potassium this AM was 3.0 -Replete magnesium as well and will replete with p.o. potassium chloride 40 mEq twice daily x2 doses -Continue to monitor and replete as necessary -Repeat CMP in AM   Hypertension  -On Amlodipine at home but this was held due to Sepsis and currently still being held -Monitor her blood pressure closely. -Last BP was stable and was 106/79  Depression/Anxiety -C/w Trazodone 50 mg po Daily and with Duloxetine 60 mg po BID   GERD -C/w Pantoprazole 40 mg pO BID   Chronic Pain Syndrome -On Hydrocodone-Acetaminophen 1 tab po q8hprn Moderate pain, Duloxetine 60 mg po BID, Pregabalin 75 mg po TID -Also have Hydromorphone 1 mg IV q4hprn Severe Pain  -Will also resume Tizanidine 6 mg po BIDprn Muscle Spasms  Obesity -Estimated body mass index is 35.12 kg/m as calculated from the following:   Height as of this encounter: '5\' 2"'  (1.575 m).   Weight as of this encounter: 87.1 kg.  -Weight Loss and Dietary Counseling given   Left Arm Swelling -In the setting of IV infiltration and extravasation of her vancomycin -Continue elevation and warm compresses -If necessary will obtain a Doppler of the left upper extremity  DVT prophylaxis: Heparin 5,000 units sq q8h Code Status: FULL CODE  Family Communication: No family present at bedside  Disposition Plan: Pending further improvement back to baseline and evaluation by PT/OT; Refusing SNF and will go home with home health and will continue antibiotics today and stop tomorrow and repeat blood work and imaging in the a.m.  Consultants:   Nephrology  Procedures: None   Antimicrobials:  Anti-infectives (From admission, onward)   Start     Dose/Rate Route Frequency Ordered Stop   01/14/19 0600  vancomycin (VANCOCIN) IVPB 1000 mg/200 mL premix  Status:  Discontinued     1,000 mg 200 mL/hr over 60 Minutes Intravenous Every 48 hours 01/12/19 1041 01/15/19 0816   01/12/19 0445  vancomycin (VANCOCIN) 1,500 mg in sodium chloride 0.9 %  500 mL IVPB     1,500 mg 250 mL/hr over 120 Minutes Intravenous NOW 01/12/19 0426 01/12/19 0750   01/11/19 2200  hydroxychloroquine (PLAQUENIL) tablet 200 mg     200 mg Oral 2 times daily 01/11/19 2105     01/11/19 1830  vancomycin (VANCOCIN) 1,500 mg in sodium chloride 0.9 % 500 mL IVPB  Status:  Discontinued     1,500 mg 250 mL/hr over 120 Minutes Intravenous  Once 01/11/19 1817 01/12/19 0426   01/11/19 1830  ceFEPIme (MAXIPIME) 2 g in sodium chloride 0.9 % 100 mL IVPB     2 g 200 mL/hr over 30 Minutes Intravenous Every 24 hours 01/11/19 1817     01/11/19 1818  vancomycin variable dose per unstable renal function (pharmacist dosing)  Status:  Discontinued      Does not apply See admin instructions 01/11/19 1818 01/15/19 0816   01/10/19 1845  cefTRIAXone (ROCEPHIN) 1 g in sodium chloride 0.9 % 100 mL IVPB     1 g 200 mL/hr over 30 Minutes Intravenous  Once 01/10/19 1835 01/10/19 2004      Subjective: Seen and examined at bedside and her mental status is doing good but she still complaining some feet pain.  Was concerned about her left arm as her left arm was extremely swollen from her IV being infiltrated.  No nausea or vomiting.  Still has some intermittent abdominal pain.  And also complains of some gas pains.  No other concerns or plans at this time.  Objective: Vitals:   01/14/19 2226 01/14/19 2235 01/15/19 0500 01/15/19 0833  BP:  (!) 113/58  106/79  Pulse: 87   90  Resp: 18   16  Temp: 98.2 F (36.8 C)   98.3 F (36.8 C)  TempSrc: Oral   Oral  SpO2: 100%   100%  Weight:   87.1 kg   Height:        Intake/Output Summary (Last 24 hours) at 01/15/2019 1614 Last data filed at 01/15/2019 0845 Gross per 24 hour  Intake 1842.69 ml  Output 350 ml  Net 1492.69 ml   Filed Weights   01/13/19 0403 01/14/19 0523 01/15/19 0500  Weight: 82.6 kg 84.6 kg 87.1 kg   Examination: Physical Exam:  Constitutional: WN/WD obese African-American female currently in no acute distress appears mildly anxious and a little uncomfortable Eyes: Lids and conjunctivae normal, sclerae anicteric; is a little bit of proptosis ENMT: External Ears, Nose appear normal. Grossly normal hearing. Mucous membranes are moist.  Neck: Appears normal, supple, no cervical masses, normal ROM, no appreciable thyromegaly; no JVD Respiratory: Diminished to auscultation bilaterally, no wheezing, rales, rhonchi or crackles. Normal respiratory effort and patient is not tachypenic. No accessory muscle use.  Unlabored breathing Cardiovascular: RRR, no murmurs / rubs / gallops. S1 and S2 auscultated.  Has some slight lower extremity edema Abdomen: Soft, non-tender, Distended due to body habitus. Bowel sounds positive.  GU: Deferred. Musculoskeletal: No clubbing / cyanosis of digits/nails. No joint deformity upper and lower extremities but had some left arm swelling compared to the right arm Skin: No rashes, lesions,  ulcers on limited skin evaluation but does have some skin changes and discoloration from her lupus disease bilaterally in her legs r. No induration; Warm and dry.  Neurologic: CN 2-12 grossly intact with no focal deficits.  Has some tardive dyskinesia. Romberg sign and cerebellar reflexes not assessed.  Psychiatric: Normal judgment and insight. Alert and oriented x 3. Mildly anxious mood and appropriate  affect.   Data Reviewed: I have personally reviewed following labs and imaging studies  CBC: Recent Labs  Lab 01/10/19 1549 01/11/19 1648 01/12/19 0207 01/13/19 1021 01/14/19 0135 01/15/19 0234  WBC 20.3* 28.5* 36.6* 31.9* 30.6* 17.2*  NEUTROABS 15.9* 27.1*  --  30.6* 29.2* 13.2*  HGB 8.9* 8.7* 9.4* 8.3* 7.6* 7.7*  HCT 29.1* 28.0* 30.3* 25.0* 23.1* 23.8*  MCV 73.1* 73.5* 73.7* 68.9* 68.5* 69.2*  PLT 199 206 233 257 270 353   Basic Metabolic Panel: Recent Labs  Lab 01/11/19 0631 01/11/19 1648 01/12/19 0207 01/13/19 0215 01/14/19 0135 01/15/19 0234  NA  --  140 138 139 138 141  140  K  --  4.6 4.9 4.8 4.0 3.0*  3.0*  CL  --  113* 111 113* 106 101  99  CO2  --  14* 13* 14* '22 29  30  ' GLUCOSE  --  76 90 110* 119* 72  73  BUN  --  45* 44* 40* 37* 38*  35*  CREATININE  --  4.09* 4.20* 3.39* 2.92* 2.70*  2.74*  CALCIUM  --  7.7* 8.3* 8.4* 8.1* 7.8*  7.8*  MG 1.4*  --  2.2 1.9 1.7 1.5*  PHOS  --   --   --  3.8 3.3 2.7  2.6   GFR: Estimated Creatinine Clearance: 21.8 mL/min (A) (by C-G formula based on SCr of 2.74 mg/dL (H)). Liver Function Tests: Recent Labs  Lab 01/10/19 1549 01/11/19 1648 01/13/19 0215 01/14/19 0135 01/15/19 0234  AST 17 13* 16 14* 15  ALT '20 17 16 14 15  ' ALKPHOS 91 99 95 86 79  BILITOT 0.8 0.7 0.6 0.6 0.6  PROT 5.9* 5.5* 5.6* 5.4* 5.1*  ALBUMIN 3.0* 2.4* 2.6*  2.6* 2.4*  2.4* 2.4*  2.3*   Recent Labs  Lab 01/10/19 1549  LIPASE 16   No results for input(s): AMMONIA in the last 168 hours. Coagulation Profile: No results for  input(s): INR, PROTIME in the last 168 hours. Cardiac Enzymes: Recent Labs  Lab 01/10/19 1549  CKTOTAL 42   BNP (last 3 results) No results for input(s): PROBNP in the last 8760 hours. HbA1C: No results for input(s): HGBA1C in the last 72 hours. CBG: No results for input(s): GLUCAP in the last 168 hours. Lipid Profile: No results for input(s): CHOL, HDL, LDLCALC, TRIG, CHOLHDL, LDLDIRECT in the last 72 hours. Thyroid Function Tests: No results for input(s): TSH, T4TOTAL, FREET4, T3FREE, THYROIDAB in the last 72 hours. Anemia Panel: Recent Labs    01/14/19 0135  FERRITIN 355*  TIBC 169*  IRON 107   Sepsis Labs: Recent Labs  Lab 01/11/19 1802 01/11/19 1807 01/11/19 2004  PROCALCITON 3.53  --   --   LATICACIDVEN  --  0.9 1.1    Recent Results (from the past 240 hour(s))  SARS Coronavirus 2 Ag (30 min TAT) - Nasal Swab (BD Veritor Kit)     Status: None   Collection Time: 01/10/19  3:49 PM   Specimen: Nasal Swab (BD Veritor Kit)  Result Value Ref Range Status   SARS Coronavirus 2 Ag NEGATIVE NEGATIVE Final    Comment: (NOTE) SARS-CoV-2 antigen NOT DETECTED.  Negative results are presumptive.  Negative results do not preclude SARS-CoV-2 infection and should not be used as the sole basis for treatment or other patient management decisions, including infection  control decisions, particularly in the presence of clinical signs and  symptoms consistent with COVID-19, or in those who have been  in contact with the virus.  Negative results must be combined with clinical observations, patient history, and epidemiological information. The expected result is Negative. Fact Sheet for Patients: PodPark.tn Fact Sheet for Healthcare Providers: GiftContent.is This test is not yet approved or cleared by the [place] FDA and  has been authorized for detection and/or diagnosis of SARS-CoV-2 by FDA under an Emergency Use  Authorization (EUA).  This EUA will remain in effect (meaning this test can be used) for the duration of  the COVID-19 de claration under Section 564(b)(1) of the Act, 21 U.S.C. section 360bbb-3(b)(1), unless the authorization is terminated or revoked sooner. Performed at [facility], [address] [place], [place] [address]   SARS CORONAVIRUS 2 (TAT 6-24 HRS) Nasopharyngeal Urine, Catheterized     Status: None   Collection Time: 01/10/19  6:03 PM   Specimen: Urine, Catheterized; Nasopharyngeal  Result Value Ref Range Status   SARS Coronavirus 2 NEGATIVE NEGATIVE Final    Comment: (NOTE) SARS-CoV-2 target nucleic acids are NOT DETECTED. The SARS-CoV-2 RNA is generally detectable in upper and lower respiratory specimens during the acute phase of infection. Negative results do not preclude SARS-CoV-2 infection, do not rule out co-infections with other pathogens, and should not be used as the sole basis for treatment or other patient management decisions. Negative results must be combined with clinical observations, patient history, and epidemiological information. The expected result is Negative. Fact Sheet for Patients: SugarRoll.be Fact Sheet for Healthcare Providers: https://www.woods-mathews.com/ This test is not yet approved or cleared by the [place] FDA and  has been authorized for detection and/or diagnosis of SARS-CoV-2 by FDA under an Emergency Use Authorization (EUA). This EUA will remain  in effect (meaning this test can be used) for the duration of the COVID-19 declaration under Section 56 4(b)(1) of the Act, 21 U.S.C. section 360bbb-3(b)(1), unless the authorization is terminated or revoked sooner. Performed at [facility], [place] [address] [place], [place] [address]   Culture, blood (routine x 2)     Status: None (Preliminary result)   Collection Time: 01/11/19  6:02 PM   Specimen: BLOOD RIGHT HAND   Result Value Ref Range Status   Specimen Description BLOOD RIGHT HAND  Final   Special Requests   Final    BOTTLES DRAWN AEROBIC ONLY Blood Culture results may not be optimal due to an inadequate volume of blood received in culture bottles   Culture   Final    NO GROWTH 4 DAYS Performed at [facility], [place] [address] [place], [place] [address]    Report Status PENDING  Incomplete  Culture, blood (routine x 2)     Status: None (Preliminary result)   Collection Time: 01/11/19  6:07 PM   Specimen: BLOOD LEFT HAND  Result Value Ref Range Status   Specimen Description BLOOD LEFT HAND  Final   Special Requests   Final    BOTTLES DRAWN AEROBIC ONLY Blood Culture results may not be optimal due to an inadequate volume of blood received in culture bottles   Culture   Final    NO GROWTH 4 DAYS Performed at [facility], [place] [address] [place],  [address]    Report Status PENDING  Incomplete  Urine culture     Status: None   Collection Time: 01/12/19  5:10 PM   Specimen: Urine, Clean Catch  Result Value Ref Range Status   Specimen Description URINE, CLEAN CATCH  Final  Special Requests Immunocompromised  Final   Culture   Final    NO GROWTH Performed at [facility], [place] [address], [place], [place] [address]    Report Status 01/13/2019 FINAL  Final  MRSA PCR Screening     Status: None   Collection Time: 01/12/19 11:05 PM   Specimen: Nasopharyngeal  Result Value Ref Range Status   MRSA by PCR NEGATIVE NEGATIVE Final    Comment:        The GeneXpert MRSA Assay (FDA approved for NASAL specimens only), is one component of a comprehensive MRSA colonization surveillance program. It is not intended to diagnose MRSA infection nor to guide or monitor treatment for MRSA infections. Performed at [facility], [place] [address] [place], [place] [address]     Radiology Studies: No results found. Scheduled Meds: . darbepoetin (ARANESP) injection -  NON-DIALYSIS  150 mcg Subcutaneous Q Fri-1800  . DULoxetine  60 mg Oral BID  . heparin  5,000 Units Subcutaneous Q8H  . hydroxychloroquine  200 mg Oral BID  . levothyroxine  25 mcg Oral Q0600  . lidocaine  1 patch Transdermal Q24H  . pantoprazole  40 mg Oral BID  . potassium chloride  40 mEq Oral BID  . predniSONE  40 mg Oral Q breakfast  . pregabalin  75 mg Oral BID  . traZODone  50 mg Oral QHS   Continuous Infusions: . ceFEPime (MAXIPIME) IV 2 g (01/14/19 1852)    LOS: 4 days   Kerney Elbe, DO [facility] PAGER is on AMION  If 7PM-7AM, please contact night-coverage [email or web address]

## 2019-01-15 NOTE — Progress Notes (Signed)
Patient ID: Carolyn Zamora, female   DOB: 11/14/1955, 63 y.o.   MRN: [id number] [facility] Progress Note   Assessment/ Plan:   1. 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  No evidence of lupus flare based on complement/double-stranded DNA levels.  Continue tapering down prednisone to 5 mg over the next week.  Renal function back to baseline; discontinue IV fluids. 2.  Urinary tract infection with sepsis: Continues to show good clinical improvement status post antibiotics/fluids. 3.  Anion gap metabolic acidosis: Secondary to acute kidney injury, improving with supportive management/fluids. 4.  Acute right foot pain/podagra: Suspected to be clinically from acute gout flare, improving with stress dose steroids. 5.  Systemic lupus erythematosus: Continue CellCept, hydroxychloroquine and begin weaning down corticosteroids to prednisone 5 mg daily that she is to take as an outpatient. 6.  Anemia of chronic kidney disease: Iron saturation appears to be satisfactory, will begin ESA 7.  Hypokalemia: Ongoing oral potassium supplementation  We will sign off at this time and recommend continued following up with her outpatient nephrologist upon discharge.  Subjective:   Complains of some left arm swelling following vancomycin infiltration.  Denies any shortness of breath or chest pain.   Objective:   BP (!) 113/58 (BP Location: Right Arm)   Pulse 87   Temp 98.2 F (36.8 C) (Oral)   Resp 18   Ht 5\' 2"  (1.575 m)   Wt 87.1 kg   SpO2 100%   BMI 35.12 kg/m   Intake/Output Summary (Last 24 hours) at 01/15/2019 0828 Last data filed at 01/15/2019 0500 Gross per 24 hour  Intake 1191.29 ml  Output 550 ml  Net 641.29 ml   Weight change: 2.5 kg  Physical Exam: Gen: Comfortably resting in bed CVS: Pulse regular rhythm, normal rate, S1 and S2 with  ejection systolic murmur Resp: Diminished breath sounds over bases, no rales/rhonchi Abd: Soft, obese, nontender Ext: Trace lower extremity edema with tenderness around ankles.  Left upper arm swelling noted  Imaging: No results found.  Labs: BMET Recent Labs  Lab 01/10/19 1549 01/11/19 1648 01/12/19 0207 01/13/19 0215 01/14/19 0135 01/15/19 0234  NA 141 140 138 139 138 141  140  K 4.1 4.6 4.9 4.8 4.0 3.0*  3.0*  CL 113* 113* 111 113* 106 101  99  CO2 18* 14* 13* 14* 22 29  30   GLUCOSE 75 76 90 110* 119* 72  73  BUN 51* 45* 44* 40* 37* 38*  35*  CREATININE 4.42* 4.09* 4.20* 3.39* 2.92* 2.70*  2.74*  CALCIUM 7.6* 7.7* 8.3* 8.4* 8.1* 7.8*  7.8*  PHOS  --   --   --  3.8 3.3 2.7  2.6   CBC Recent Labs  Lab 01/11/19 1648 01/12/19 0207 01/13/19 1021 01/14/19 0135 01/15/19 0234  WBC 28.5* 36.6* 31.9* 30.6* 17.2*  NEUTROABS 27.1*  --  30.6* 29.2* 13.2*  HGB 8.7* 9.4* 8.3* 7.6* 7.7*  HCT 28.0* 30.3* 25.0* 23.1* 23.8*  MCV 73.5* 73.7* 68.9* 68.5* 69.2*  PLT 206 233 257 270 329    Medications:    . darbepoetin (ARANESP) injection - NON-DIALYSIS  150 mcg Subcutaneous Q Fri-1800  . DULoxetine  60 mg Oral BID  . heparin  5,000 Units Subcutaneous Q8H  . hydroxychloroquine  200 mg Oral BID  . levothyroxine  25 mcg Oral Q0600  . lidocaine  1 patch Transdermal Q24H  .  pantoprazole  40 mg Oral BID  . potassium chloride  40 mEq Oral BID  . predniSONE  40 mg Oral Q breakfast  . pregabalin  75 mg Oral BID  . traZODone  50 mg Oral QHS   Elmarie Shiley, MD 01/15/2019, 8:28 AM

## 2019-01-16 ENCOUNTER — Inpatient Hospital Stay (HOSPITAL_COMMUNITY): Payer: Medicare Other

## 2019-01-16 DIAGNOSIS — Q600 Renal agenesis, unilateral: Secondary | ICD-10-CM

## 2019-01-16 DIAGNOSIS — R400 Somnolence: Secondary | ICD-10-CM

## 2019-01-16 DIAGNOSIS — E870 Hyperosmolality and hypernatremia: Secondary | ICD-10-CM

## 2019-01-16 DIAGNOSIS — R569 Unspecified convulsions: Secondary | ICD-10-CM

## 2019-01-16 LAB — CULTURE, BLOOD (ROUTINE X 2)
Culture: NO GROWTH
Culture: NO GROWTH

## 2019-01-16 LAB — COMPREHENSIVE METABOLIC PANEL
ALT: 15 U/L (ref 0–44)
AST: 16 U/L (ref 15–41)
Albumin: 2.4 g/dL — ABNORMAL LOW (ref 3.5–5.0)
Alkaline Phosphatase: 76 U/L (ref 38–126)
Anion gap: 9 (ref 5–15)
BUN: 34 mg/dL — ABNORMAL HIGH (ref 8–23)
CO2: 30 mmol/L (ref 22–32)
Calcium: 8.2 mg/dL — ABNORMAL LOW (ref 8.9–10.3)
Chloride: 107 mmol/L (ref 98–111)
Creatinine, Ser: 2.84 mg/dL — ABNORMAL HIGH (ref 0.44–1.00)
GFR calc Af Amer: 20 mL/min — ABNORMAL LOW (ref >60)
GFR calc non Af Amer: 17 mL/min — ABNORMAL LOW (ref >60)
Glucose, Bld: 91 mg/dL (ref 70–99)
Potassium: 3.1 mmol/L — ABNORMAL LOW (ref 3.5–5.1)
Sodium: 146 mmol/L — ABNORMAL HIGH (ref 135–145)
Total Bilirubin: 0.7 mg/dL (ref 0.3–1.2)
Total Protein: 5.3 g/dL — ABNORMAL LOW (ref 6.5–8.1)

## 2019-01-16 LAB — URINALYSIS, ROUTINE W REFLEX MICROSCOPIC
Bilirubin Urine: NEGATIVE
Glucose, UA: NEGATIVE mg/dL
Hgb urine dipstick: NEGATIVE
Ketones, ur: NEGATIVE mg/dL
Leukocytes,Ua: NEGATIVE
Nitrite: NEGATIVE
Protein, ur: NEGATIVE mg/dL
Specific Gravity, Urine: 1.01 (ref 1.005–1.030)
pH: 7 (ref 5.0–8.0)

## 2019-01-16 LAB — CBC WITH DIFFERENTIAL/PLATELET
Abs Immature Granulocytes: 0.27 10*3/uL — ABNORMAL HIGH (ref 0.00–0.07)
Basophils Absolute: 0 10*3/uL (ref 0.0–0.1)
Basophils Relative: 0 %
Eosinophils Absolute: 0 10*3/uL (ref 0.0–0.5)
Eosinophils Relative: 0 %
HCT: 24.8 % — ABNORMAL LOW (ref 36.0–46.0)
Hemoglobin: 8.2 g/dL — ABNORMAL LOW (ref 12.0–15.0)
Immature Granulocytes: 2 %
Lymphocytes Relative: 9 %
Lymphs Abs: 1.6 10*3/uL (ref 0.7–4.0)
MCH: 22.8 pg — ABNORMAL LOW (ref 26.0–34.0)
MCHC: 33.1 g/dL (ref 30.0–36.0)
MCV: 69.1 fL — ABNORMAL LOW (ref 80.0–100.0)
Monocytes Absolute: 1.2 10*3/uL — ABNORMAL HIGH (ref 0.1–1.0)
Monocytes Relative: 7 %
Neutro Abs: 15.3 10*3/uL — ABNORMAL HIGH (ref 1.7–7.7)
Neutrophils Relative %: 82 %
Platelets: 318 10*3/uL (ref 150–400)
RBC: 3.59 MIL/uL — ABNORMAL LOW (ref 3.87–5.11)
RDW: 14.3 % (ref 11.5–15.5)
WBC: 18.4 10*3/uL — ABNORMAL HIGH (ref 4.0–10.5)
nRBC: 0.2 % (ref 0.0–0.2)

## 2019-01-16 LAB — BLOOD GAS, ARTERIAL
Acid-Base Excess: 8.5 mmol/L — ABNORMAL HIGH (ref 0.0–2.0)
Bicarbonate: 32.4 mmol/L — ABNORMAL HIGH (ref 20.0–28.0)
FIO2: 21
O2 Saturation: 96.4 %
Patient temperature: 37
pCO2 arterial: 43.4 mmHg (ref 32.0–48.0)
pH, Arterial: 7.486 — ABNORMAL HIGH (ref 7.350–7.450)
pO2, Arterial: 80.7 mmHg — ABNORMAL LOW (ref 83.0–108.0)

## 2019-01-16 LAB — URIC ACID: Uric Acid, Serum: 10.3 mg/dL — ABNORMAL HIGH (ref 2.5–7.1)

## 2019-01-16 LAB — PHOSPHORUS: Phosphorus: 2.1 mg/dL — ABNORMAL LOW (ref 2.5–4.6)

## 2019-01-16 LAB — MAGNESIUM: Magnesium: 2 mg/dL (ref 1.7–2.4)

## 2019-01-16 LAB — GLUCOSE, CAPILLARY: Glucose-Capillary: 80 mg/dL (ref 70–99)

## 2019-01-16 LAB — PROCALCITONIN: Procalcitonin: 0.65 ng/mL

## 2019-01-16 MED ORDER — POTASSIUM CHLORIDE 10 MEQ/100ML IV SOLN
10.0000 meq | INTRAVENOUS | Status: AC
  Administered 2019-01-16 (×4): 10 meq via INTRAVENOUS
  Filled 2019-01-16 (×3): qty 100

## 2019-01-16 MED ORDER — CHLORHEXIDINE GLUCONATE CLOTH 2 % EX PADS
6.0000 | MEDICATED_PAD | Freq: Every day | CUTANEOUS | Status: DC
  Administered 2019-01-17 – 2019-01-23 (×6): 6 via TOPICAL

## 2019-01-16 MED ORDER — POTASSIUM CHLORIDE 10 MEQ/100ML IV SOLN
INTRAVENOUS | Status: AC
  Administered 2019-01-16: 10 meq
  Filled 2019-01-16: qty 100

## 2019-01-16 MED ORDER — POTASSIUM CL IN DEXTROSE 5% 20 MEQ/L IV SOLN
20.0000 meq | INTRAVENOUS | Status: AC
  Administered 2019-01-16: 20 meq via INTRAVENOUS
  Filled 2019-01-16: qty 1000

## 2019-01-16 MED ORDER — SODIUM CHLORIDE 0.9 % IV SOLN
1.0000 g | INTRAVENOUS | Status: DC
  Administered 2019-01-16 – 2019-01-23 (×7): 1 g via INTRAVENOUS
  Filled 2019-01-16: qty 10
  Filled 2019-01-16: qty 1
  Filled 2019-01-16: qty 10
  Filled 2019-01-16 (×6): qty 1
  Filled 2019-01-16: qty 10

## 2019-01-16 MED ORDER — POTASSIUM CHLORIDE CRYS ER 20 MEQ PO TBCR
40.0000 meq | EXTENDED_RELEASE_TABLET | Freq: Once | ORAL | Status: DC
  Filled 2019-01-16: qty 2

## 2019-01-16 MED ORDER — PREDNISONE 20 MG PO TABS
30.0000 mg | ORAL_TABLET | Freq: Every day | ORAL | Status: DC
  Administered 2019-01-20 – 2019-01-21 (×2): 30 mg via ORAL
  Filled 2019-01-16 (×5): qty 1

## 2019-01-16 MED ORDER — K PHOS MONO-SOD PHOS DI & MONO 155-852-130 MG PO TABS
500.0000 mg | ORAL_TABLET | Freq: Two times a day (BID) | ORAL | Status: AC
  Filled 2019-01-16 (×2): qty 2

## 2019-01-16 NOTE — Consult Note (Signed)
Urology Consult  CC: right mild hydronephrosis Referring physician: Dr. Raiford Noble Reason for referral: right mild hydronephrosis  Impression/Assessment:  63 year old woman with a solitary right kidney in setting of mild right hydronephrosis.  Imaging does not reveal an obstructing stone; however, bladder is distended.  Plan:  -Recommend bladder scan and if elevated Foley catheter for maximal drainage -Patient's creatinine has been trending down for the last few days 4.42 on admission to 2.8 for now -Will await bladder scan results to decide on further intervention    History of Present Illness:  63 year old woman with history of renal cell carcinoma status post left radical nephrectomy, CKD stage IV, hypertension, lupus and history of severe AKI requiring dialysis in 2018 as result of septic shock who presented to the ER  01/11/19 with 5 days of bilateral foot pain and acute on chronic kidney injury with WBC of 28 and creatinine of 4.02.    Urology was called on 01/16/2019 for findings of mild right hydronephrosis with some perinephric stranding on the noncontrast CT scan done 01/15/2019.  There were no renal calculi or ureteral calculi seen.  However bladder does look distended on imaging.  During encounter patient did not wake up to give history so this is all per chart review.  Past Medical History:  Diagnosis Date   Abdominal wall hematoma 08/01/2017   Anemia    AVN (avascular necrosis of bone) ([place]) 08/01/2017   CKD (chronic kidney disease), stage IV ([facility])    History of dental abscess with sepsis 2017 08/02/2017   History of kidney cancer    Hypertension    Lupus ([place])    Renal cell carcinoma of left kidney s/p nephrectomy 06/21/2014   Ventral hernia without obstruction or gangrene 10/24/2015   Past Surgical History:  Procedure Laterality Date   INCISIONAL HERNIA REPAIR  11/19/2015   [facility].  Dr Phineas Douglas.  left [place] repair w mesh   MULTIPLE TOOTH EXTRACTIONS       NEPHRECTOMY     ORIF ANKLE FRACTURE  11/16/2015   Max Cohen, 2017   SPLENECTOMY      Medications: I have reviewed the patient's current medications.  Allergies: No Known Allergies  Family History  Problem Relation Age of Onset   Hypertension Mother    Diabetes Mellitus I Mother    Lung cancer Father    Hypertension Sister    Colon cancer Brother     Social History:  reports that she has never smoked. She has never used smokeless tobacco. She reports that she does not drink alcohol or use drugs.  Review of Systems (10 point): Pertinent items are noted in HPI. A comprehensive review of systems was negative except as noted above.  Physical Exam:  Vital signs in last 24 hours: Temp:  [97.4 F (36.3 C)-98.2 F (36.8 C)] 98.2 F (36.8 C) (12/06 1527) Pulse Rate:  [86-98] 90 (12/06 1527) Resp:  [16-20] 16 (12/06 1527) BP: (125-167)/(77-99) 149/83 (12/06 1527) SpO2:  [98 %-100 %] 98 % (12/06 1527) Weight:  [84.5 kg] 84.5 kg (12/06 0507) General appearance: alert and appears stated age Head: Normocephalic, without obvious abnormality, atraumatic Eyes: Patient did not open eyes Oropharynx: moist mucous membranes Neck: supple, symmetrical, trachea midline Resp: normal respiratory effort Cardio: regular rate and rhythm Back: symmetric, no curvature. ROM normal. No CVA tenderness. GI: soft, non-tender; bowel sounds normal; no masses,  no organomegaly; obese, multiple well-healed incisions and left subcostal well-healed incision Skin: Skin color normal. No visible rashes or  lesions Neurologic: Grossly normal  Laboratory Data:  Recent Labs    01/15/19 0234 01/15/19 1815 01/16/19 0227  WBC 17.2* 13.4* 18.4*  HGB 7.7* 9.1* 8.2*  HCT 23.8* 28.2* 24.8*   BMET Recent Labs    01/15/19 1815 01/16/19 0227  NA 142 146*  K 3.6 3.1*  CL 100 107  CO2 27 30  GLUCOSE 105* 91  BUN 34* 34*  CREATININE 2.83* 2.84*  CALCIUM 8.6* 8.2*   No results for input(s): LABPT,  INR in the last 72 hours. No results for input(s): LABURIN in the last 72 hours. Results for orders placed or performed during the hospital encounter of 01/10/19  SARS Coronavirus 2 Ag (30 min TAT) - Nasal Swab (BD Veritor Kit)     Status: None   Collection Time: 01/10/19  3:49 PM   Specimen: Nasal Swab (BD Veritor Kit)  Result Value Ref Range Status   SARS Coronavirus 2 Ag NEGATIVE NEGATIVE Final    Comment: (NOTE) SARS-CoV-2 antigen NOT DETECTED.  Negative results are presumptive.  Negative results do not preclude SARS-CoV-2 infection and should not be used as the sole basis for treatment or other patient management decisions, including infection  control decisions, particularly in the presence of clinical signs and  symptoms consistent with COVID-19, or in those who have been in contact with the virus.  Negative results must be combined with clinical observations, patient history, and epidemiological information. The expected result is Negative. Fact Sheet for Patients: PodPark.tn Fact Sheet for Healthcare Providers: GiftContent.is This test is not yet approved or cleared by the [place] FDA and  has been authorized for detection and/or diagnosis of SARS-CoV-2 by FDA under an Emergency Use Authorization (EUA).  This EUA will remain in effect (meaning this test can be used) for the duration of  the COVID-19 de claration under Section 564(b)(1) of the Act, 21 U.S.C. section 360bbb-3(b)(1), unless the authorization is terminated or revoked sooner. Performed at [facility], [address] [place], [place] [address]   SARS CORONAVIRUS 2 (TAT 6-24 HRS) Nasopharyngeal Urine, Catheterized     Status: None   Collection Time: 01/10/19  6:03 PM   Specimen: Urine, Catheterized; Nasopharyngeal  Result Value Ref Range Status   SARS Coronavirus 2 NEGATIVE NEGATIVE Final    Comment: (NOTE) SARS-CoV-2 target nucleic  acids are NOT DETECTED. The SARS-CoV-2 RNA is generally detectable in upper and lower respiratory specimens during the acute phase of infection. Negative results do not preclude SARS-CoV-2 infection, do not rule out co-infections with other pathogens, and should not be used as the sole basis for treatment or other patient management decisions. Negative results must be combined with clinical observations, patient history, and epidemiological information. The expected result is Negative. Fact Sheet for Patients: SugarRoll.be Fact Sheet for Healthcare Providers: https://www.woods-mathews.com/ This test is not yet approved or cleared by the [place] FDA and  has been authorized for detection and/or diagnosis of SARS-CoV-2 by FDA under an Emergency Use Authorization (EUA). This EUA will remain  in effect (meaning this test can be used) for the duration of the COVID-19 declaration under Section 56 4(b)(1) of the Act, 21 U.S.C. section 360bbb-3(b)(1), unless the authorization is terminated or revoked sooner. Performed at [facility], [place] [address] [place], [place] [address]   Culture, blood (routine x 2)     Status: None   Collection Time: 01/11/19  6:02 PM   Specimen: BLOOD RIGHT HAND  Result Value Ref Range  Status   Specimen Description BLOOD RIGHT HAND  Final   Special Requests   Final    BOTTLES DRAWN AEROBIC ONLY Blood Culture results may not be optimal due to an inadequate volume of blood received in culture bottles   Culture   Final    NO GROWTH 5 DAYS Performed at [facility], [place] [address] [place], [place] [address]    Report Status 01/16/2019 FINAL  Final  Culture, blood (routine x 2)     Status: None   Collection Time: 01/11/19  6:07 PM   Specimen: BLOOD LEFT HAND  Result Value Ref Range Status   Specimen Description BLOOD LEFT HAND  Final   Special Requests   Final    BOTTLES DRAWN AEROBIC ONLY Blood Culture  results may not be optimal due to an inadequate volume of blood received in culture bottles   Culture   Final    NO GROWTH 5 DAYS Performed at [facility], [place] [address] [place], [place] [address]    Report Status 01/16/2019 FINAL  Final  Urine culture     Status: None   Collection Time: 01/12/19  5:10 PM   Specimen: Urine, Clean Catch  Result Value Ref Range Status   Specimen Description URINE, CLEAN CATCH  Final   Special Requests Immunocompromised  Final   Culture   Final    NO GROWTH Performed at [facility], [place] [address] [place],  [address]    Report Status 01/13/2019 FINAL  Final  MRSA PCR Screening     Status: None   Collection Time: 01/12/19 11:05 PM   Specimen: Nasopharyngeal  Result Value Ref Range Status   MRSA by PCR NEGATIVE NEGATIVE Final    Comment:        The GeneXpert MRSA Assay (FDA approved for NASAL specimens only), is one component of a comprehensive MRSA colonization surveillance program. It is not intended to diagnose MRSA infection nor to guide or monitor treatment for MRSA infections. Performed at [facility], [place] [address], [place], [place] [address]    Creatinine: Recent Labs    01/11/19 1648 01/12/19 0207 01/13/19 0215 01/14/19 0135 01/15/19 0234 01/15/19 1815 01/16/19 0227  CREATININE 4.09* 4.20* 3.39* 2.92* 2.70*   2.74* 2.83* 2.84*    Imaging: Ct Abdomen Pelvis Wo Contrast  Result Date: 01/15/2019 CLINICAL DATA:  63 year old female with abdominal distention, nausea vomiting. EXAM: CT ABDOMEN AND PELVIS WITHOUT CONTRAST TECHNIQUE: Multidetector CT imaging of the abdomen and pelvis was performed following the standard protocol without IV contrast. COMPARISON:  CT abdomen pelvis dated 01/12/2019. FINDINGS: Evaluation of this exam is limited in the absence of intravenous contrast. Evaluation is also limited due to streak artifact caused by patient's arms. Lower chest: Partially visualized small left  pleural effusion with left lung base partial atelectasis versus infiltrate. Clinical correlation is recommended. There is a 7 mm subpleural nodule at the right lung base (series 5 image 28) similar to the study of 2019. There is hypoattenuation of the cardiac blood pool suggestive of a degree of anemia. Clinical correlation is recommended. No intra-abdominal free air or free fluid. Hepatobiliary: The liver is grossly unremarkable. Minimal layering sludge may be present within the gallbladder. No pericholecystic fluid or evidence of acute cholecystitis by CT. Pancreas: The pancreas is mildly atrophic. No active inflammatory changes. 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 Adrenals/Urinary Tract: Status post prior left nephrectomy. There is mild right hydronephrosis.  Right perinephric stranding noted. Correlation with urinalysis recommended to exclude UTI. No obstructing stone identified. The urinary bladder is grossly unremarkable. Stomach/Bowel: There is no bowel obstruction or active inflammation. The appendix is normal. Vascular/Lymphatic: Moderate aortoiliac atherosclerotic disease. The IVC is grossly unremarkable. No portal venous gas. There is no adenopathy. Reproductive: Probable partially calcified exophytic fibroid from the uterine fundus. No adnexal masses. Other: Mild diffuse subcutaneous edema. No drainable fluid collection. Musculoskeletal: Avascular necrosis of the femoral heads bilaterally. There is irregularity of the right femoral head consistent with subcortical collapse, chronic. Severe degenerative changes at L4-L5 and L5-S1. There is grade 2 L4-L5 anterolisthesis and grade 1 L5-S1 retrolisthesis. There is associated moderate narrowing of the central canal at this level. No acute osseous pathology. IMPRESSION: 1. Small left pleural effusion with left lung base partial atelectasis versus infiltrate. Clinical correlation is recommended. 2. Mild right  hydronephrosis, new since the prior CT. There is right perinephric stranding. Correlation with urinalysis recommended to exclude UTI. 3. No bowel obstruction or active inflammation. Normal appendix. 4. Aortic Atherosclerosis (ICD10-I70.0). 5. Lower lumbar listhesis and degenerative changes with associated narrowing of the central canal noted Electronically Signed   By: Anner Crete M.D.   On: 01/15/2019 22:48   Dg Abd 1 View  Result Date: 01/15/2019 CLINICAL DATA:  Abdominal pain. EXAM: ABDOMEN - 1 VIEW COMPARISON:  CT scan January 15, 2019 FINDINGS: The lung bases are unremarkable. No free air, portal venous gas, or pneumatosis. Contrast is seen throughout the colon. No evidence of bowel obstruction. No other acute abnormalities identified. IMPRESSION: No acute abnormality seen.  No evidence of bowel obstruction. Electronically Signed   By: Dorise Bullion III M.D   On: 01/15/2019 22:58   Ct Head Wo Contrast  Result Date: 01/16/2019 CLINICAL DATA:  Unexplained altered level of consciousness. EXAM: CT HEAD WITHOUT CONTRAST TECHNIQUE: Contiguous axial images were obtained from the base of the skull through the vertex without intravenous contrast. COMPARISON:  11/05/2015 FINDINGS: Brain: Some motion degradation. Mild atrophy as seen previously. No sign of acute infarction, mass lesion, hemorrhage, hydrocephalus or extra-axial collection. Vascular: There is atherosclerotic calcification of the major vessels at the base of the brain. Skull: Negative Sinuses/Orbits: Clear/normal Other: None IMPRESSION: No acute finding.  Mild atrophy. Electronically Signed   By: Nelson Chimes M.D.   On: 01/16/2019 13:42   Mr Brain Wo Contrast  Result Date: 01/16/2019 CLINICAL DATA:  63 year old female with unexplained altered mental status. History of renal cell carcinoma status post nephrectomy, and lupus on chronic immunosuppressive therapy. EXAM: MRI HEAD WITHOUT CONTRAST TECHNIQUE: Multiplanar, multiecho pulse  sequences of the brain and surrounding structures were obtained without intravenous contrast. COMPARISON:  Head CT earlier today. [facility]. Brain MRI 07/27/2014 FINDINGS: Brain: Generalized cerebral volume loss since 2016, mild-to-moderate. No restricted diffusion to suggest acute infarction. No midline shift, mass effect, evidence of mass lesion, ventriculomegaly, extra-axial collection or acute intracranial hemorrhage. Cervicomedullary junction and pituitary are within normal limits. Scattered mostly subcortical white matter T2 and FLAIR hyperintensity is not significantly changed since 2016. No superimposed cortical encephalomalacia or chronic cerebral blood products. Deep gray matter nuclei, brainstem and cerebellum appear negative. Vascular: Major intracranial vascular flow voids are stable since 2016. Chronic generalized intracranial artery tortuosity. Skull and upper cervical spine: Normal visible cervical spine. Visualized bone marrow signal is within normal limits. Sinuses/Orbits: Negative orbits. Trace paranasal sinus mucosal thickening is stable. Other: Trace left mastoid effusion Right mastoids has regressed remain  clear. Negative visible internal auditory structures. Scalp and face soft tissues appear negative. IMPRESSION: 1. No acute intracranial abnormality. 2. Stable white matter signal changes since 2016, nonspecific but most commonly due to chronic small vessel disease. 3. Mild to moderate generalized cerebral volume loss since 2006. Electronically Signed   By: Genevie Ann M.D.   On: 01/16/2019 14:50       Mickey Hebel D Rozena Fierro 01/16/2019, 4:48 PM

## 2019-01-16 NOTE — Progress Notes (Signed)
PROGRESS NOTE    Carolyn Zamora  ZOX:[id number] DOB: October 04, 1955 DOA: 01/10/2019 PCP: Beckie Salts, MD   Brief Narrative:  HPI per Dr. Early Osmond on 01/11/2019 Carolyn Zamora is a 63 y.o. female with medical history significant of renal cell carcinoma status post left nephrectomy, history of splenectomy, CKD stage III, chronic pain syndrome, morbid obesity, lupus-on chronic immunosuppressive therapy (mycophenolate, hydroxychloroquine, prednisone), hypertension, anemia of chronic disease came from [place] for the concern of sepsis secondary to UTI and worsening kidney function.  Patient tells me that she has feet pain which is severe in intensity since 5 days.  Unable to walk/move, could not sleep at night, no aggravating or relieving factors, associated with chills denies association with trauma, fever, decreased appetite, generalized weakness or lethargy.  Had history of gout however currently not on any medicines.  X-ray of right foot obtained at [facility]-which came back negative.  She reports lower abdominal pain and chronic back pain however denies urinary symptoms such as dysuria, hematuria, foul-smelling urine, nocturia, nausea, vomiting.  She lives alone at home and her daughter and sister helps her.  No history of smoking, alcohol, illicit drug use.  At [facility]: Patient was tachycardic, tachypneic, leukocytosis of 20.3, UA positive for small leukocytes.  CK, lipase level: WNL, COVID-19 negative, magnesium: Low-- was replaced.  CMP shows worsening kidney function.  Patient received IV fluids and Rocephin for the concern of urosepsis.  Patient transferred to [facility] for further evaluation and management.  **Interim History Nephrology does not feel that she has a lupus flare but her stress dosing her steroids and checking complement level and serological work-up which was negative for Lupus flare.  WBC elevated likely in the setting of her steroids and is  slowly trending down slightly.  Patient complaining of foot pain still but this is improving after being on Steroids.  No evidence of DVT on lower extremity Dopplers.  Patient's hemoglobin is also trending downward so nephrology will start ESA.  Patient had complained of some foot pain and yesterday her IV infiltrated and had some extravasation of her vancomycin.  IV was removed and applied warm compresses.  IV vancomycin has been stopped given her negative MRSA PCR. PT/OT recommending SNF but Patient refusing and wanting to go Home.  Stress dose steroids have been tapered down to prednisone 40 mg and will be further tapered down to 5 mg prednisone daily to her baseline dose.  Renal function is still improving and nephrology stopped her fluids yesterday but I restarted him last night as patient became nauseous with dry heaving and complaining of abdominal pain.  CT scan of the abdomen pelvis was done which showed a small left pleural effusion with the left lung base partial atelectasis versus infiltrate.  She had mild right hydronephrosis which was new and no bowel obstruction or active inflammation.  This morning patient was persistently drowsy and somnolent obtain an ABG, head CT scan without contrast, and MRI as well as an EEG and her pregabalin, tizanidine and trazodone were all discontinued. Case was also discussed with Neurology.  Case was also discussed with urology who will review the new CT scan and make recommendations for the new hydronephrosis and perinephric stranding and in the interim we will check a bladder scan and insert a Foley catheter and restart antibiotics with IV ceftriaxone.  Assessment & Plan:   Principal Problem:   Sepsis ([place]) Active Problems:   Hypertension   Systemic lupus erythematosus (HCC)   Chronic  pain syndrome   Renal cell carcinoma of left kidney s/p nephrectomy   Anemia of chronic disease   Renal failure (ARF), acute on chronic (HCC)   Pain in both feet    Hypomagnesemia  Sepsis of Unknown Etiology but likely in the setting of Pyelonephritis and Complicated by Gout Placement -Denies any urinary symptoms but now having Nausea, Dry Heaving and Abdominal Pain. Initial UA is positive only for trace leukocytes. -UA was repeated and showed a hazy appearance with small hemoglobin, large leukocytes, negative nitrites, many bacteria, 21-50 WBCs, 0-5 red blood cells per high-power field, and repeat urine culture showed no growth but patient had already been on antibiotics -MRSA PCR was negative and now I have stopped IV vancomycin.  Unfortunately yesterday her IV infiltrated and she had some extravasation of the vancomycin and now has a swollen left arm -CT Abd/Pelvis showed "Patchy airspace opacity seen at both lung bases which is non-specific could be related to early infectious etiology and/or atelectasis. Mild inflammatory changes surrounding the right kidney which could be due to pyelonephritis. No right-sided renal or collecting system calculi. Aortic Atherosclerosis." -Patient is afebrile, tachycardic (HR still on the faster side), tachypneic, leukocytosis of 20.3-->Trended up to 28.5 and worsened to 36.6 but  Related to Steroids; now WBC is trended down and is going from 30,600 and now is further trended down to 13,400 but had a slight bump with at Leukocytosis of 18.4 -KUB showed "No acute abnormality seen.  No evidence of bowel obstruction." -Repeat CT Scan  On 01/15/2019 showed "Small left pleural effusion with left lung base partial atelectasis versus infiltrate. Clinical correlation is recommended. Mild right hydronephrosis, new since the prior CT. There is right perinephric stranding. Correlation with urinalysis recommended to exclude UTI. No bowel obstruction or active inflammation. Normal appendix. Aortic Atherosclerosis (ICD10-I70.0). Lower lumbar listhesis and degenerative changes with associated narrowing of the central canal noted."  -Chest x-ray  was negative but CT did show patchy airspace opacites; states that she has a mild cough but is nonproductive -Right foot x-ray: Negative.   -COVID-19 negative, lipase: WNL.  -Patient does not have chronic leukocytosis although she is on prednisone at home.   -Patient has feet pain, worsening kidney function along with anemia-multiple myeloma could have been a  consideration?. -Patient is obese with limited mobility due to feet pain with history of lupus-she is on intermediate risk for PE however could not get CT angiogram due to worsening kidney function and D-dimer will be non specific due to worsening kidney function. -We will get Doppler ultrasound of bilateral lower extremity to rule out DVT and showed no evidence of DVT but was limited   -Started patient on broad-spectrum antibiotics IV Vanco and cefepime but now stopped the IV vancomycin and will continue IV cefepime for now likely stop the cefepime today as she will have had 5 days of antibiotics but will start IV ceftriaxone and de-escalate antibiotics given her continued perinephric stranding and concern for urinary tract infection and pyelonephritis.  Blood cultures 01/11/2019 and showed NGTD at 5 Days -Has some intermittent nausea and abdominal pain but this is now worsening -Repeat CT scan of the abdomen pelvis as above -We will discuss with Urology about perinephric stranding and hydronephrosis -May need some Ceftriaxone?   Bilateral Feet Pain, improving  -Unknown etiology?,  Had a history of gout in the past. -We will check uric acid, CRP, sed rate; LDH was 238, Uric Acid was 11.5, and ESR was <4; Repeat  -Avoid  NSAIDs; May need a Larger Prednisone Taper but will need to address Infection first and she is on Stress Dose Steroids with Hydrocortisone 100 mg IV q8h and now changed to po Prednisone 40 mg by Nephrology and will continue today and transition to 30 mg p.o. tomorrow -Dilaudid as needed for pain control -May need Colchicine    -Repeat Uric Acid Level was 10.3 -Resumed Pain Meds as below but will hold possibly   AKI on CKD stage IV Metabolic Acidosis, improved -Likely secondary from underlying infection and nephrology feels presumably due to ischemic ATN in setting of sepsis and pyelonephritis with a solitary functioning kidney do not feel that this is consistent with a lupus flare -Patient is BUN/creatinine went from 51/4.42 -> 45/4.09 -> 44/4.20 -> 40/3.39 -> 37/2.92 -> 35/2.70 -> 34/2.83 -> 34/2.84 -Patient's CO2 was 13, anion gap was 14, and chloride levels 111; Now AG is 11, CO2 is 30, and Chloride is 101 -Nephrology started the patient on sodium bicarbonate infusion with 150 mEq at 75 mL's per hour and will discontinue now; Was started back no NS yesterday due to Nausea and Vomiting but changed to D5W + 20 mEQ of KCl at 50 mL/hr given Hypernatremia  -Patient has history of left nephrectomy due to renal cell carcinoma. -Potassium was hypokalemic and potassium was 3.0 and magnesium was 1.5  -Renal ultrasound showed "Mild increased renal echogenicity. No hydronephrosis or shadowing stone."  -Repeat CT Scan showed Mild Hydronephrosis  -Avoid nephrotoxic medications as well as contrast dyes and patient is currently having her furosemide held [facility] nephrology for further evaluation and management. Appreciated Assistance -Per Nephrology no indication for HD at this time and IV fluids had been discontinued by Nephrology but will be resumed given yesterday's findings  -Will discuss with Urology about perinephric stranding; will resume IV ceftriaxone for antibiotic coverage and discontinue cefepime given the neurotoxicity associated with lupus -We will BladderScan the patient ASAP and if necessary place a Foley catheter -Repeat CMP in AM   Microcytic Anemia/Anemia of Chronic Kidney Disease  -Has a history of warm hemolytic anemia but her T bili is normal and LDH is elevated above normal at 238 and now down to  176 -Patient's Hb/Hct went from 8.9/29.1 -> 8.7/20.0 -> 9.4/30.3 -> 8.3/25.0 -> 7.6/23.1 -> 7.7/23.8 -> 9.1/28.2 -> 8.2/24.8 -T Bili today was 0.7 -Initial Anemia Panel and showed an iron level of 14, U IBC of 172, TIBC of 186, saturation ratios of 8%, ferritin level 371 -Repeat anemia panel showed an iron level of 107, U IBC of 62, TIBC 169, saturation ratios of 63%, ferritin of 355 -Nephrology gave the patient darbepoetin alfa 150 mcg subcu today -No signs of active bleeding, monitor H&H closely. -We will check iron studies.  Hypomagnesemia -Replaced and improved and Mag Level went from 1.4 -> 2.2 -> 1.9 -> 1.7 -> 1.5 -> 2.0 -Continue to Monitor and Replete as Necessary -Repeat Mag Level in AM   Lupus, currently not in flare -Mycophenolate 500 mg po BID held by Nephrology initially; Will defer to them to Resume and I spoke to Dr. Posey Pronto of Nephrology and he recommended resuming today at 500 mg BID initially but now recommends holding it due to her Somonlence  -Prednisone 5 mg po Daily held for Stress Dose Steroids but now back on po Prednisone 40 and will taper down to 5 mg in the coming weeks -C/w Hydroxychloroquine 200 mg po BID -Checking Complement Serologies as well ANA/dsDNA to asses for Lupus Flare and  this was negative -Nephrology also checking UP/C but has "Dirty U/A"  Hypokalemia -Potassium this AM was 3.1 -Mag Level ok -Replete with po KCl 40 mEQ but may be unable to take so will order it IV; Also has po K Phos Neutral 500 mg po BID x2 doses  -Continue to monitor and replete as necessary -Repeat CMP in AM   Hypertension  -On Amlodipine at home but this was held due to Sepsis and currently still being held -Monitor her blood pressure closely. -Last BP was stable and was 106/79  Depression/Anxiety -Discontinued Trazodone 50 mg po Daily due to somnolence -C/w Duloxetine 60 mg po BID   GERD -C/w Pantoprazole 40 mg pO BID   Chronic Pain Syndrome -On  Hydrocodone-Acetaminophen 1 tab po q8hprn Moderate pain, Duloxetine 60 mg po BID, Pregabalin 75 mg po TID usually  -Also have Hydromorphone 1 mg IV q4hprn Severe Pain  -STOPPED Tizanidine 6 mg po BIDprn Muscle Spasms and will aslo stop Pregabalin 75 mg BID (Renally adjusted here)  Obesity -Estimated body mass index is 34.07 kg/m as calculated from the following:   Height as of this encounter: '5\' 2"'  (1.575 m).   Weight as of this encounter: 84.5 kg. -Weight Loss and Dietary Counseling given   Left Arm Swelling -In the setting of IV infiltration and extravasation of her vancomycin -Continue elevation and warm compresses -If necessary will obtain a Doppler of the left upper extremity  Hypernatremia -Patient's Na+ this AM was 146 and in the setting of NS -Changed to D5W + 20 mEQ of KCl -Continue to Monitor and Trend -Repeat CMP in AM  Somnolence/Drowsiness -Patient has been more somnolent and not as responsive since last night -She will wake up briefly but then closed her eyes and answer some questions -We will discontinue all sedative medications including tizanidine, pregabalin, and trazodone -Check an ABG and showed a pH of 7.486, PCO2 of 43.4, P O2 of 80.7, bicarbonate level 32.4, and an ABG O2 saturation of 96.4% on 21% FiO2 -Checked a stat head CT which showed no acute finding and mild atrophy -Also check an MRI of the brain without contrast given her renal function and shows ". No acute intracranial abnormality. Stable white matter signal changes since 2016, nonspecific but most commonly due to chronic small vessel disease.  Mild to moderate generalized cerebral volume loss since 2006." -We will also check an EEG which is done and was read as "This is a markedly abnormal electroencephalogram secondary to a slow and disorganized background with frequent diffusely distributed triphasic waves.  This finding is consistent with a severe encephalopathy, etiologically  nonspecific." -Discussed the case with neurology and will have the following consult if she is not improving even after medications have been stopped  DVT prophylaxis: Heparin 5,000 units sq q8h Code Status: FULL CODE  Family Communication: No family present at bedside  Disposition Plan: Pending further improvement back to baseline and evaluation by PT/OT; Refusing SNF and will go home with home health and will continue antibiotics today and stop tomorrow and repeat blood work and imaging in the a.m.  Consultants:   Nephrology  Discussed the case with neurology Dr. Amie Portland  Discussed the case with Urology Dr. Jacalyn Lefevre   Procedures: None   Antimicrobials:  Anti-infectives (From admission, onward)   Start     Dose/Rate Route Frequency Ordered Stop   01/14/19 0600  vancomycin (VANCOCIN) IVPB 1000 mg/200 mL premix  Status:  Discontinued     1,000 mg  200 mL/hr over 60 Minutes Intravenous Every 48 hours 01/12/19 1041 01/15/19 0816   01/12/19 0445  vancomycin (VANCOCIN) 1,500 mg in sodium chloride 0.9 % 500 mL IVPB     1,500 mg 250 mL/hr over 120 Minutes Intravenous NOW 01/12/19 0426 01/12/19 0750   01/11/19 2200  hydroxychloroquine (PLAQUENIL) tablet 200 mg     200 mg Oral 2 times daily 01/11/19 2105     01/11/19 1830  vancomycin (VANCOCIN) 1,500 mg in sodium chloride 0.9 % 500 mL IVPB  Status:  Discontinued     1,500 mg 250 mL/hr over 120 Minutes Intravenous  Once 01/11/19 1817 01/12/19 0426   01/11/19 1830  ceFEPIme (MAXIPIME) 2 g in sodium chloride 0.9 % 100 mL IVPB  Status:  Discontinued     2 g 200 mL/hr over 30 Minutes Intravenous Every 24 hours 01/11/19 1817 01/16/19 0928   01/11/19 1818  vancomycin variable dose per unstable renal function (pharmacist dosing)  Status:  Discontinued      Does not apply See admin instructions 01/11/19 1818 01/15/19 0816   01/10/19 1845  cefTRIAXone (ROCEPHIN) 1 g in sodium chloride 0.9 % 100 mL IVPB     1 g 200 mL/hr over 30 Minutes  Intravenous  Once 01/10/19 1835 01/10/19 2004     Subjective: Seen and examined at bedside she was more somnolent today and very drowsy and not as alert and does not wake up fully when roused. She would answer some questions but close her eyes and go back to sleep.  Still complaining of some nausea and vomiting and dry heaving along with some abdominal pain.  States "I do not feel well" but unable to elaborate on this and provide much more of a subjective history.   Objective: Vitals:   01/16/19 0446 01/16/19 0507 01/16/19 0736 01/16/19 1527  BP: (!) 167/87  (!) 125/97 (!) 149/83  Pulse: 95  98 90  Resp: '20  16 16  ' Temp: 98.2 F (36.8 C)  98 F (36.7 C) 98.2 F (36.8 C)  TempSrc: Oral  Oral Oral  SpO2: 100%  100% 98%  Weight:  84.5 kg    Height:        Intake/Output Summary (Last 24 hours) at 01/16/2019 1535 Last data filed at 01/16/2019 1610 Gross per 24 hour  Intake 636.23 ml  Output 251 ml  Net 385.23 ml   Filed Weights   01/14/19 0523 01/15/19 0500 01/16/19 0507  Weight: 84.6 kg 87.1 kg 84.5 kg   Examination: Physical Exam:  Constitutional: WN/W obese African-American female currently appears somnolent and drowsy and when she wakes up has some dry heaves and does look a little uncomfortable Eyes: Lids and conjunctivae normal, sclerae anicteric  ENMT: External Ears, Nose appear normal. Grossly normal hearing. Neck: Appears normal, supple, no cervical masses, normal ROM, no appreciable thyromegaly; no JVD Respiratory: Mildly diminished to auscultation bilaterally a little more on the left compared to the right, no wheezing, rales, rhonchi or crackles. Normal respiratory effort and patient is not tachypenic. No accessory muscle use.  Unlabored breathing Cardiovascular: RRR, no murmurs / rubs / gallops. S1 and S2 auscultated.  Has some extremity edema.   Abdomen: Soft, Tender, Distended secondary body habitus. Bowel sounds positive.  GU: Deferred. Musculoskeletal: No  clubbing / cyanosis of digits/nails.  Left arm is little bit more swollen than the right Skin: No rashes, lesions, ulcers on limited skin evaluation and does have some lower extremity skin changes discoloration from her lupus disease  bilaterally in her legs. No induration; Warm and dry.  Neurologic: Appears somnolent and drowsy and did not fully awake to sternal rub Psychiatric: Impaired judgment and insight.  She is not alert and oriented x 3.   Data Reviewed: I have personally reviewed following labs and imaging studies  CBC: Recent Labs  Lab 01/13/19 1021 01/14/19 0135 01/15/19 0234 01/15/19 1815 01/16/19 0227  WBC 31.9* 30.6* 17.2* 13.4* 18.4*  NEUTROABS 30.6* 29.2* 13.2* 12.3* 15.3*  HGB 8.3* 7.6* 7.7* 9.1* 8.2*  HCT 25.0* 23.1* 23.8* 28.2* 24.8*  MCV 68.9* 68.5* 69.2* 70.0* 69.1*  PLT 257 270 329 371 063   Basic Metabolic Panel: Recent Labs  Lab 01/13/19 0215 01/14/19 0135 01/15/19 0234 01/15/19 1815 01/16/19 0227  NA 139 138 141   140 142 146*  K 4.8 4.0 3.0*   3.0* 3.6 3.1*  CL 113* 106 101   99 100 107  CO2 14* '22 29   30 27 30  ' GLUCOSE 110* 119* 72   73 105* 91  BUN 40* 37* 38*   35* 34* 34*  CREATININE 3.39* 2.92* 2.70*   2.74* 2.83* 2.84*  CALCIUM 8.4* 8.1* 7.8*   7.8* 8.6* 8.2*  MG 1.9 1.7 1.5* 2.3 2.0  PHOS 3.8 3.3 2.7   2.6 2.2* 2.1*   GFR: Estimated Creatinine Clearance: 20.7 mL/min (A) (by C-G formula based on SCr of 2.84 mg/dL (H)). Liver Function Tests: Recent Labs  Lab 01/13/19 0215 01/14/19 0135 01/15/19 0234 01/15/19 1815 01/16/19 0227  AST 16 14* '15 18 16  ' ALT '16 14 15 16 15  ' ALKPHOS 95 86 79 93 76  BILITOT 0.6 0.6 0.6 0.9 0.7  PROT 5.6* 5.4* 5.1* 6.4* 5.3*  ALBUMIN 2.6*   2.6* 2.4*   2.4* 2.4*   2.3* 2.9* 2.4*   Recent Labs  Lab 01/10/19 1549  LIPASE 16   No results for input(s): AMMONIA in the last 168 hours. Coagulation Profile: No results for input(s): INR, PROTIME in the last 168 hours. Cardiac Enzymes: Recent Labs  Lab  01/10/19 1549  CKTOTAL 42   BNP (last 3 results) No results for input(s): PROBNP in the last 8760 hours. HbA1C: No results for input(s): HGBA1C in the last 72 hours. CBG: Recent Labs  Lab 01/16/19 0455  GLUCAP 80   Lipid Profile: No results for input(s): CHOL, HDL, LDLCALC, TRIG, CHOLHDL, LDLDIRECT in the last 72 hours. Thyroid Function Tests: Recent Labs    01/15/19 1815  TSH 3.797   Anemia Panel: Recent Labs    01/14/19 0135  FERRITIN 355*  TIBC 169*  IRON 107   Sepsis Labs: Recent Labs  Lab 01/11/19 1802 01/11/19 1807 01/11/19 2004 01/15/19 1815 01/16/19 0227  PROCALCITON 3.53  --   --  0.62 0.65  LATICACIDVEN  --  0.9 1.1  --   --     Recent Results (from the past 240 hour(s))  SARS Coronavirus 2 Ag (30 min TAT) - Nasal Swab (BD Veritor Kit)     Status: None   Collection Time: 01/10/19  3:49 PM   Specimen: Nasal Swab (BD Veritor Kit)  Result Value Ref Range Status   SARS Coronavirus 2 Ag NEGATIVE NEGATIVE Final    Comment: (NOTE) SARS-CoV-2 antigen NOT DETECTED.  Negative results are presumptive.  Negative results do not preclude SARS-CoV-2 infection and should not be used as the sole basis for treatment or other patient management decisions, including infection  control decisions, particularly in the presence of clinical  signs and  symptoms consistent with COVID-19, or in those who have been in contact with the virus.  Negative results must be combined with clinical observations, patient history, and epidemiological information. The expected result is Negative. Fact Sheet for Patients: PodPark.tn Fact Sheet for Healthcare Providers: GiftContent.is This test is not yet approved or cleared by the [place] FDA and  has been authorized for detection and/or diagnosis of SARS-CoV-2 by FDA under an Emergency Use Authorization (EUA).  This EUA will remain in effect (meaning this test can be  used) for the duration of  the COVID-19 de claration under Section 564(b)(1) of the Act, 21 U.S.C. section 360bbb-3(b)(1), unless the authorization is terminated or revoked sooner. Performed at [facility], [address] [place], [place] [address]   SARS CORONAVIRUS 2 (TAT 6-24 HRS) Nasopharyngeal Urine, Catheterized     Status: None   Collection Time: 01/10/19  6:03 PM   Specimen: Urine, Catheterized; Nasopharyngeal  Result Value Ref Range Status   SARS Coronavirus 2 NEGATIVE NEGATIVE Final    Comment: (NOTE) SARS-CoV-2 target nucleic acids are NOT DETECTED. The SARS-CoV-2 RNA is generally detectable in upper and lower respiratory specimens during the acute phase of infection. Negative results do not preclude SARS-CoV-2 infection, do not rule out co-infections with other pathogens, and should not be used as the sole basis for treatment or other patient management decisions. Negative results must be combined with clinical observations, patient history, and epidemiological information. The expected result is Negative. Fact Sheet for Patients: SugarRoll.be Fact Sheet for Healthcare Providers: https://www.woods-mathews.com/ This test is not yet approved or cleared by the [place] FDA and  has been authorized for detection and/or diagnosis of SARS-CoV-2 by FDA under an Emergency Use Authorization (EUA). This EUA will remain  in effect (meaning this test can be used) for the duration of the COVID-19 declaration under Section 56 4(b)(1) of the Act, 21 U.S.C. section 360bbb-3(b)(1), unless the authorization is terminated or revoked sooner. Performed at [facility], [place] [address], [place], [place] [address]   Culture, blood (routine x 2)     Status: None   Collection Time: 01/11/19  6:02 PM   Specimen: BLOOD RIGHT HAND  Result Value Ref Range Status   Specimen Description BLOOD RIGHT HAND  Final   Special Requests    Final    BOTTLES DRAWN AEROBIC ONLY Blood Culture results may not be optimal due to an inadequate volume of blood received in culture bottles   Culture   Final    NO GROWTH 5 DAYS Performed at [facility], [place] [address], [place], [place] [address]    Report Status 01/16/2019 FINAL  Final  Culture, blood (routine x 2)     Status: None   Collection Time: 01/11/19  6:07 PM   Specimen: BLOOD LEFT HAND  Result Value Ref Range Status   Specimen Description BLOOD LEFT HAND  Final   Special Requests   Final    BOTTLES DRAWN AEROBIC ONLY Blood Culture results may not be optimal due to an inadequate volume of blood received in culture bottles   Culture   Final    NO GROWTH 5 DAYS Performed at [facility], [place] [place], [place] [address]    Report Status 01/16/2019 FINAL  Final  Urine culture     Status: None   Collection Time: 01/12/19  5:10 PM   Specimen: Urine, Clean Catch  Result Value Ref Range  Status   Specimen Description URINE, CLEAN CATCH  Final   Special Requests Immunocompromised  Final   Culture   Final    NO GROWTH Performed at [facility], [place] [address], [place], [place] [address]    Report Status 01/13/2019 FINAL  Final  MRSA PCR Screening     Status: None   Collection Time: 01/12/19 11:05 PM   Specimen: Nasopharyngeal  Result Value Ref Range Status   MRSA by PCR NEGATIVE NEGATIVE Final    Comment:        The GeneXpert MRSA Assay (FDA approved for NASAL specimens only), is one component of a comprehensive MRSA colonization surveillance program. It is not intended to diagnose MRSA infection nor to guide or monitor treatment for MRSA infections. Performed at [facility], [place] [address], [place], [place] [address]     Radiology Studies: Ct Abdomen Pelvis Wo Contrast  Result Date: 01/15/2019 CLINICAL DATA:  63 year old female with abdominal distention, nausea vomiting. EXAM: CT ABDOMEN AND PELVIS WITHOUT CONTRAST TECHNIQUE:  Multidetector CT imaging of the abdomen and pelvis was performed following the standard protocol without IV contrast. COMPARISON:  CT abdomen pelvis dated 01/12/2019. FINDINGS: Evaluation of this exam is limited in the absence of intravenous contrast. Evaluation is also limited due to streak artifact caused by patient's arms. Lower chest: Partially visualized small left pleural effusion with left lung base partial atelectasis versus infiltrate. Clinical correlation is recommended. There is a 7 mm subpleural nodule at the right lung base (series 5 image 28) similar to the study of 2019. There is hypoattenuation of the cardiac blood pool suggestive of a degree of anemia. Clinical correlation is recommended. No intra-abdominal free air or free fluid. Hepatobiliary: The liver is grossly unremarkable. Minimal layering sludge may be present within the gallbladder. No pericholecystic fluid or evidence of acute cholecystitis by CT. Pancreas: The pancreas is mildly atrophic. No active inflammatory changes. 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 Adrenals/Urinary Tract: Status post prior left nephrectomy. There is mild right hydronephrosis. Right perinephric stranding noted. Correlation with urinalysis recommended to exclude UTI. No obstructing stone identified. The urinary bladder is grossly unremarkable. Stomach/Bowel: There is no bowel obstruction or active inflammation. The appendix is normal. Vascular/Lymphatic: Moderate aortoiliac atherosclerotic disease. The IVC is grossly unremarkable. No portal venous gas. There is no adenopathy. Reproductive: Probable partially calcified exophytic fibroid from the uterine fundus. No adnexal masses. Other: Mild diffuse subcutaneous edema. No drainable fluid collection. Musculoskeletal: Avascular necrosis of the femoral heads bilaterally. There is irregularity of the right femoral head consistent with subcortical collapse, chronic. Severe  degenerative changes at L4-L5 and L5-S1. There is grade 2 L4-L5 anterolisthesis and grade 1 L5-S1 retrolisthesis. There is associated moderate narrowing of the central canal at this level. No acute osseous pathology. IMPRESSION: 1. Small left pleural effusion with left lung base partial atelectasis versus infiltrate. Clinical correlation is recommended. 2. Mild right hydronephrosis, new since the prior CT. There is right perinephric stranding. Correlation with urinalysis recommended to exclude UTI. 3. No bowel obstruction or active inflammation. Normal appendix. 4. Aortic Atherosclerosis (ICD10-I70.0). 5. Lower lumbar listhesis and degenerative changes with associated narrowing of the central canal noted Electronically Signed   By: Anner Crete M.D.   On: 01/15/2019 22:48   Dg Abd 1 View  Result Date: 01/15/2019 CLINICAL DATA:  Abdominal pain. EXAM: ABDOMEN - 1 VIEW COMPARISON:  CT scan January 15, 2019 FINDINGS: The lung bases are unremarkable. No free air,  portal venous gas, or pneumatosis. Contrast is seen throughout the colon. No evidence of bowel obstruction. No other acute abnormalities identified. IMPRESSION: No acute abnormality seen.  No evidence of bowel obstruction. Electronically Signed   By: Dorise Bullion III M.D   On: 01/15/2019 22:58   Ct Head Wo Contrast  Result Date: 01/16/2019 CLINICAL DATA:  Unexplained altered level of consciousness. EXAM: CT HEAD WITHOUT CONTRAST TECHNIQUE: Contiguous axial images were obtained from the base of the skull through the vertex without intravenous contrast. COMPARISON:  11/05/2015 FINDINGS: Brain: Some motion degradation. Mild atrophy as seen previously. No sign of acute infarction, mass lesion, hemorrhage, hydrocephalus or extra-axial collection. Vascular: There is atherosclerotic calcification of the major vessels at the base of the brain. Skull: Negative Sinuses/Orbits: Clear/normal Other: None IMPRESSION: No acute finding.  Mild atrophy.  Electronically Signed   By: Nelson Chimes M.D.   On: 01/16/2019 13:42   Mr Brain Wo Contrast  Result Date: 01/16/2019 CLINICAL DATA:  63 year old female with unexplained altered mental status. History of renal cell carcinoma status post nephrectomy, and lupus on chronic immunosuppressive therapy. EXAM: MRI HEAD WITHOUT CONTRAST TECHNIQUE: Multiplanar, multiecho pulse sequences of the brain and surrounding structures were obtained without intravenous contrast. COMPARISON:  Head CT earlier today. [facility]. Brain MRI 07/27/2014 FINDINGS: Brain: Generalized cerebral volume loss since 2016, mild-to-moderate. No restricted diffusion to suggest acute infarction. No midline shift, mass effect, evidence of mass lesion, ventriculomegaly, extra-axial collection or acute intracranial hemorrhage. Cervicomedullary junction and pituitary are within normal limits. Scattered mostly subcortical white matter T2 and FLAIR hyperintensity is not significantly changed since 2016. No superimposed cortical encephalomalacia or chronic cerebral blood products. Deep gray matter nuclei, brainstem and cerebellum appear negative. Vascular: Major intracranial vascular flow voids are stable since 2016. Chronic generalized intracranial artery tortuosity. Skull and upper cervical spine: Normal visible cervical spine. Visualized bone marrow signal is within normal limits. Sinuses/Orbits: Negative orbits. Trace paranasal sinus mucosal thickening is stable. Other: Trace left mastoid effusion Right mastoids has regressed remain clear. Negative visible internal auditory structures. Scalp and face soft tissues appear negative. IMPRESSION: 1. No acute intracranial abnormality. 2. Stable white matter signal changes since 2016, nonspecific but most commonly due to chronic small vessel disease. 3. Mild to moderate generalized cerebral volume loss since 2006. Electronically Signed   By: Genevie Ann M.D.   On:  01/16/2019 14:50   Scheduled Meds:  darbepoetin (ARANESP) injection - NON-DIALYSIS  150 mcg Subcutaneous Q Fri-1800   DULoxetine  60 mg Oral BID   heparin  5,000 Units Subcutaneous Q8H   hydroxychloroquine  200 mg Oral BID   levothyroxine  25 mcg Oral Q0600   lidocaine  1 patch Transdermal Q24H   pantoprazole  40 mg Oral BID   phosphorus  500 mg Oral BID   polyethylene glycol  17 g Oral BID   potassium chloride  40 mEq Oral Once   [START ON 01/17/2019] predniSONE  30 mg Oral Q breakfast   senna-docusate  1 tablet Oral BID   Continuous Infusions:  dextrose 5 % with KCl 20 mEq / L 20 mEq (01/16/19 0840)    LOS: 5 days   Kerney Elbe, DO [facility] is on AMION  If 7PM-7AM, please contact night-coverage [email or web address]

## 2019-01-16 NOTE — Procedures (Signed)
ELECTROENCEPHALOGRAM REPORT   Patient: Carolyn Zamora       Room #: 7D42A EEG No. ID: [id number] Age: 63 y.o.        Sex: female Referring Physician: Alfredia Ferguson Report Date:  01/16/2019        Interpreting Physician: Alexis Goodell  History: Carolyn Zamora is an 63 y.o. female with sepsis  Medications:  Aranesp, Cymbalta, Plaquenil, Synthroid, Deltasone  Conditions of Recording:  This is a 21 channel routine scalp EEG performed with bipolar and monopolar montages arranged in accordance to the international 10/20 system of electrode placement. One channel was dedicated to EKG recording.  The patient is in the altered state.  Description:  The background activity is slow and disorganized.  This slow and poorly organized background is diffusely distributed.  There are also frequent diffusely distributed periodic discharges of triphasic morphology seen throughout the recording.   Hyperventilation and intermittent photic stimulation were not performed.  IMPRESSION: This is a markedly abnormal electroencephalogram secondary to a slow and disorganized background with frequent diffusely distributed triphasic waves.  This finding is consistent with a severe encephalopathy, etiologically nonspecific.    Alexis Goodell, MD Neurology [phone] 01/16/2019, 4:00 PM

## 2019-01-16 NOTE — Progress Notes (Signed)
Pt noted to be less oriented than the beginning of shift. Pt not telling this nurse where she is or what year it is. BGM taken. VS taken and charted. Pt alert and moving all limbs. Provider on call notified. Orders rec'd

## 2019-01-16 NOTE — Progress Notes (Signed)
Patient is still not waking up fully. She will rouse when touched/spoken to and answer, but she is unable to stay awake. She states, "I don't feel good" and "I'm tired," but is unable to answer questions about where she is, her birthdate, what year it is or any other LOC questions. She nods "yes," but then does not verbalize an answer. She has been waking up intermittently to retch/dry-heave and nothing is coming up. She was unable to take any of her medications this morning and she is not eating or drinking. She had 12-lead, troponin and abd CT last night, which were negative. Updated Dr. Alfredia Ferguson on patient's condition. She orders. Will continue to monitor.

## 2019-01-16 NOTE — Progress Notes (Signed)
EEG completed, results pending. 

## 2019-01-17 LAB — CBC WITH DIFFERENTIAL/PLATELET
Abs Immature Granulocytes: 0.29 10*3/uL — ABNORMAL HIGH (ref 0.00–0.07)
Basophils Absolute: 0.1 10*3/uL (ref 0.0–0.1)
Basophils Relative: 0 %
Eosinophils Absolute: 0.3 10*3/uL (ref 0.0–0.5)
Eosinophils Relative: 2 %
HCT: 28.7 % — ABNORMAL LOW (ref 36.0–46.0)
Hemoglobin: 8.8 g/dL — ABNORMAL LOW (ref 12.0–15.0)
Immature Granulocytes: 2 %
Lymphocytes Relative: 11 %
Lymphs Abs: 1.7 10*3/uL (ref 0.7–4.0)
MCH: 22.5 pg — ABNORMAL LOW (ref 26.0–34.0)
MCHC: 30.7 g/dL (ref 30.0–36.0)
MCV: 73.4 fL — ABNORMAL LOW (ref 80.0–100.0)
Monocytes Absolute: 1 10*3/uL (ref 0.1–1.0)
Monocytes Relative: 7 %
Neutro Abs: 12 10*3/uL — ABNORMAL HIGH (ref 1.7–7.7)
Neutrophils Relative %: 78 %
Platelets: 183 10*3/uL (ref 150–400)
RBC: 3.91 MIL/uL (ref 3.87–5.11)
RDW: 15.6 % — ABNORMAL HIGH (ref 11.5–15.5)
WBC: 15.3 10*3/uL — ABNORMAL HIGH (ref 4.0–10.5)
nRBC: 2 % — ABNORMAL HIGH (ref 0.0–0.2)

## 2019-01-17 LAB — COMPREHENSIVE METABOLIC PANEL
ALT: 17 U/L (ref 0–44)
AST: 21 U/L (ref 15–41)
Albumin: 2.5 g/dL — ABNORMAL LOW (ref 3.5–5.0)
Alkaline Phosphatase: 83 U/L (ref 38–126)
Anion gap: 13 (ref 5–15)
BUN: 33 mg/dL — ABNORMAL HIGH (ref 8–23)
CO2: 28 mmol/L (ref 22–32)
Calcium: 8.5 mg/dL — ABNORMAL LOW (ref 8.9–10.3)
Chloride: 107 mmol/L (ref 98–111)
Creatinine, Ser: 3.03 mg/dL — ABNORMAL HIGH (ref 0.44–1.00)
GFR calc Af Amer: 18 mL/min — ABNORMAL LOW (ref >60)
GFR calc non Af Amer: 16 mL/min — ABNORMAL LOW (ref >60)
Glucose, Bld: 66 mg/dL — ABNORMAL LOW (ref 70–99)
Potassium: 4.1 mmol/L (ref 3.5–5.1)
Sodium: 148 mmol/L — ABNORMAL HIGH (ref 135–145)
Total Bilirubin: 0.9 mg/dL (ref 0.3–1.2)
Total Protein: 5.3 g/dL — ABNORMAL LOW (ref 6.5–8.1)

## 2019-01-17 LAB — PHOSPHORUS: Phosphorus: 2.3 mg/dL — ABNORMAL LOW (ref 2.5–4.6)

## 2019-01-17 LAB — PROCALCITONIN: Procalcitonin: 0.54 ng/mL

## 2019-01-17 LAB — AMMONIA: Ammonia: 14 umol/L (ref 9–35)

## 2019-01-17 LAB — MAGNESIUM: Magnesium: 1.8 mg/dL (ref 1.7–2.4)

## 2019-01-17 MED ORDER — DEXTROSE 5 % IV SOLN
INTRAVENOUS | Status: AC
  Administered 2019-01-17 – 2019-01-18 (×2): via INTRAVENOUS

## 2019-01-17 NOTE — Progress Notes (Signed)
PROGRESS NOTE    Carolyn Zamora  RSW:[id number] DOB: 05-Apr-1955 DOA: 01/10/2019 PCP: Carolyn Salts, MD   Brief Narrative:  HPI per Dr. Early Zamora on 01/11/2019 Carolyn Zamora is a 63 y.o. female with medical history significant of renal cell carcinoma status post left nephrectomy, history of splenectomy, CKD stage III, chronic pain syndrome, morbid obesity, lupus-on chronic immunosuppressive therapy (mycophenolate, hydroxychloroquine, prednisone), hypertension, anemia of chronic disease came from [place] for the concern of sepsis secondary to UTI and worsening kidney function.  Patient tells me that she has feet pain which is severe in intensity since 5 days.  Unable to walk/move, could not sleep at night, no aggravating or relieving factors, associated with chills denies association with trauma, fever, decreased appetite, generalized weakness or lethargy.  Had history of gout however currently not on any medicines.  X-ray of right foot obtained at [facility]-which came back negative.  She reports lower abdominal pain and chronic back pain however denies urinary symptoms such as dysuria, hematuria, foul-smelling urine, nocturia, nausea, vomiting.  She lives alone at home and her daughter and sister helps her.  No history of smoking, alcohol, illicit drug use.  At [facility]: Patient was tachycardic, tachypneic, leukocytosis of 20.3, UA positive for small leukocytes.  CK, lipase level: WNL, COVID-19 negative, magnesium: Low-- was replaced.  CMP shows worsening kidney function.  Patient received IV fluids and Rocephin for the concern of urosepsis.  Patient transferred to [facility] for further evaluation and management.  **Interim History Nephrology does not feel that she has a lupus flare but her stress dosing her steroids and checking complement level and serological work-up which was negative for Lupus flare.  WBC elevated likely in the setting of her steroids and is  slowly trending down slightly.  Patient complaining of foot pain still but this is improving after being on Steroids.  No evidence of DVT on lower extremity Dopplers.  Patient's hemoglobin is also trending downward so nephrology will start ESA.  Patient had complained of some foot pain and yesterday her IV infiltrated and had some extravasation of her vancomycin.  IV was removed and applied warm compresses.  IV vancomycin has been stopped given her negative MRSA PCR. PT/OT recommending SNF but Patient refusing and wanting to go Home.  Stress dose steroids have been tapered down to prednisone 40 mg and will be further tapered down to 5 mg prednisone daily to her baseline dose.  Renal function is still improving and nephrology stopped her fluids yesterday but I restarted him last night as patient became nauseous with dry heaving and complaining of abdominal pain.  CT scan of the abdomen pelvis was done which showed a small left pleural effusion with the left lung base partial atelectasis versus infiltrate.  She had mild right hydronephrosis which was new and no bowel obstruction or active inflammation.  Yesterday morning patient was persistently drowsy and somnolent obtain an ABG, head CT scan without contrast, and MRI as well as an EEG and her pregabalin, tizanidine and trazodone were all discontinued. Case was also discussed with Neurology and they will formally consult today.  Case was also discussed with urology who will review the new CT scan and make recommendations for the new hydronephrosis and perinephric stranding and in the interim we will check a bladder scan and insert a Foley catheter and restart antibiotics with IV ceftriaxone.  Unfortunately Foley catheter came out and was reinserted.  Because patient persistently has not improving she was moved to  stepdown/progressive bed status.  Assessment & Plan:   Principal Problem:   Sepsis ([place]) Active Problems:   Hypertension   Systemic lupus  erythematosus (HCC)   Chronic pain syndrome   Renal cell carcinoma of left kidney s/p nephrectomy   Anemia of chronic disease   Renal failure (ARF), acute on chronic (HCC)   Pain in both feet   Hypomagnesemia  Sepsis of Unknown Etiology but likely in the setting of Pyelonephritis and Complicated by Gout Placement -Denies any urinary symptoms but now having Nausea, Dry Heaving and Abdominal Pain. Initial UA is positive only for trace leukocytes. -UA was repeated and showed a hazy appearance with small hemoglobin, large leukocytes, negative nitrites, many bacteria, 21-50 WBCs, 0-5 red blood cells per high-power field, and repeat urine culture showed no growth but patient had already been on antibiotics -MRSA PCR was negative and now I have stopped IV vancomycin.  Unfortunately yesterday her IV infiltrated and she had some extravasation of the vancomycin and now has a swollen left arm -CT Abd/Pelvis showed "Patchy airspace opacity seen at both lung bases which is non-specific could be related to Carolyn infectious etiology and/or atelectasis. Mild inflammatory changes surrounding the right kidney which could be due to pyelonephritis. No right-sided renal or collecting system calculi. Aortic Atherosclerosis." -Patient is afebrile, tachycardic (HR still on the faster side), tachypneic, leukocytosis of 20.3-->Trended up to 28.5 and worsened to 36.6 but  Related to Steroids; now WBC is trended down and is going from 30,600 and now is further trended down to 13,400 but had a slight bump with at Leukocytosis of 18.4 -> 15.3 -KUB showed "No acute abnormality seen.  No evidence of bowel obstruction." -Repeat CT Scan  On 01/15/2019 showed "Small left pleural effusion with left lung base partial atelectasis versus infiltrate. Clinical correlation is recommended. Mild right hydronephrosis, new since the prior CT. There is right perinephric stranding. Correlation with urinalysis recommended to exclude UTI. No bowel  obstruction or active inflammation. Normal appendix. Aortic Atherosclerosis (ICD10-I70.0). Lower lumbar listhesis and degenerative changes with associated narrowing of the central canal noted."  -Chest x-ray was negative but CT did show patchy airspace opacites; states that she has a mild cough but is nonproductive -Right foot x-ray: Negative.   -COVID-19 negative, lipase: WNL.  -Patient does not have chronic leukocytosis although she is on prednisone at home.   -Patient has feet pain, worsening kidney function along with anemia-multiple myeloma could have been a  consideration?. -Patient is obese with limited mobility due to feet pain with history of lupus-she is on intermediate risk for PE however could not get CT angiogram due to worsening kidney function and D-dimer will be non specific due to worsening kidney function. -We will get Doppler ultrasound of bilateral lower extremity to rule out DVT and showed no evidence of DVT but was limited   -Started patient on broad-spectrum antibiotics IV Vanco and cefepime but now stopped the IV vancomycin and will continue IV cefepime for now likely stop the cefepime today as she will have had 5 days of antibiotics but will start IV ceftriaxone and de-escalate antibiotics given her continued perinephric stranding and concern for urinary tract infection and pyelonephritis.  Blood cultures 01/11/2019 and showed NGTD at 5 Days -Has some intermittent nausea and abdominal pain but this is now worsening -Repeat CT scan of the abdomen pelvis as above -We will discuss with Urology about perinephric stranding and hydronephrosis -May need some Ceftriaxone?   Bilateral Feet Pain  -Unknown etiology?,  Had a history of gout in the past. -We will check uric acid, CRP, sed rate; LDH was 238, Uric Acid was 11.5, and ESR was <4; Repeat  -Avoid NSAIDs; May need a Larger Prednisone Taper but will need to address Infection first and she is on Stress Dose Steroids with  Hydrocortisone 100 mg IV q8h and now changed to po Prednisone 40 mg by Nephrology and will continue today and transition to 30 mg p.o. tomorrow -Dilaudid as needed for pain control -May need Colchicine  -Repeat Uric Acid Level was 10.3 -Hold sedative medications as she is somnolent and difficult to arouse  AKI on CKD stage IV Metabolic Acidosis, improved -Likely secondary from underlying infection and nephrology feels presumably due to ischemic ATN in setting of sepsis and pyelonephritis with a solitary functioning kidney do not feel that this is consistent with a lupus flare -Patient is BUN/creatinine went from 51/4.42 -> 45/4.09 -> 44/4.20 -> 40/3.39 -> 37/2.92 -> 35/2.70 -> 34/2.83 -> 34/2.84 -> 33/2.03 -Patient's CO2 was 13, anion gap was 14, and chloride levels 111; Now AG is 13, CO2 is 28 30, and Chloride is 107 -Nephrology started the patient on sodium bicarbonate infusion with 150 mEq at 75 mL's per hour and will discontinue now; Was started back no NS yesterday due to Nausea and Vomiting but changed to D5W + 20 mEQ of KCl at 50 mL/hr given Hypernatremia; now fluids have been increased to D5W at 100 mL/hr for 20 hours -Patient has history of left nephrectomy due to renal cell carcinoma. -Potassium was hypokalemic and potassium was 3.0 and magnesium was 1.5  -Renal ultrasound showed "Mild increased renal echogenicity. No hydronephrosis or shadowing stone."  -Repeat CT Scan showed Mild Hydronephrosis  -Avoid nephrotoxic medications as well as contrast dyes and patient is currently having her furosemide held [facility] nephrology for further evaluation and management. Appreciated Assistance -Per Nephrology no indication for HD at this time and IV fluids had been discontinued by Nephrology but will be resumed given yesterday's findings  -Will discuss with Urology about perinephric stranding; will resume IV ceftriaxone for antibiotic coverage and discontinue cefepime given the neurotoxicity  associated with lupus -Bladder scan done she was retaining so Foley catheter was inserted however unfortunately Foley catheter came out this morning and will be reinserted as likely the balloon was done for evaluation -Repeat CMP in AM   Microcytic Anemia/Anemia of Chronic Kidney Disease  -Has a history of warm hemolytic anemia but her T bili is normal and LDH is elevated above normal at 238 and now down to 176 -Patient's Hb/Hct has been stable and has been 8.8/28.7 -T Bili today was 0.9 -Initial Anemia Panel and showed an iron level of 14, U IBC of 172, TIBC of 186, saturation ratios of 8%, ferritin level 371 -Repeat anemia panel showed an iron level of 107, U IBC of 62, TIBC 169, saturation ratios of 63%, ferritin of 355 -Nephrology gave the patient darbepoetin alfa 150 mcg subcu today -No signs of active bleeding, monitor H&H closely. -We will check iron studies.  Hypomagnesemia -Magnesium is now 1.8 today -Continue to Monitor and Replete as Necessary -Repeat Mag Level in AM   Lupus, currently not in flare -Mycophenolate 500 mg po BID held by Nephrology initially; Will defer to them to Resume and I spoke to Dr. Posey Pronto of Nephrology and he recommended resuming today at 500 mg BID initially but now recommends holding it due to her Somonlence  -Prednisone 5 mg po Daily held for  Stress Dose Steroids but now back on po Prednisone 40 and will taper down to 5 mg in the coming weeks if she is able to tolerate p.o. -C/w Hydroxychloroquine 200 mg po BID -Checking Complement Serologies as well ANA/dsDNA to asses for Lupus Flare and this was negative -Nephrology also checking UP/C but has "Dirty U/A"  Hypokalemia -Potassium this AM was 4.0 -Mag Level ok at 1.8 -Continue to monitor and replete as necessary -Repeat CMP in AM   Hypertension  -On Amlodipine at home but this was held due to Sepsis and currently still being held -Monitor her blood pressure closely. -Last BP was stable and was  133/77  Depression/Anxiety -Discontinued Trazodone 50 mg po Daily due to somnolence -C/w Duloxetine 60 mg po BID   GERD -C/w Pantoprazole 40 mg pO BID   Chronic Pain Syndrome -On Hydrocodone-Acetaminophen 1 tab po q8hprn Moderate pain, Duloxetine 60 mg po BID, Pregabalin 75 mg po TID usually  -Also have Hydromorphone 1 mg IV q4hprn Severe Pain  -STOPPED Tizanidine 6 mg po BIDprn Muscle Spasms and will also stop Pregabalin 75 mg BID (Renally adjusted here)  Obesity -Estimated body mass index is 33.63 kg/m as calculated from the following:   Height as of this encounter: '5\' 2"'  (1.575 m).   Weight as of this encounter: 83.4 kg. -Weight Loss and Dietary Counseling given   Left Arm Swelling -In the setting of IV infiltration and extravasation of her vancomycin -Continue elevation and warm compresses -If necessary will obtain a Doppler of the left upper extremity  Hypernatremia, slightly worsened -Patient's Na+ this AM was 148  -Changed to D5W + 20 mEQ of KCl yesterday but is now just on D5W at 100 mL's per hour -Continue to Monitor and Trend -Repeat CMP in AM  Somnolence/Drowsiness, persistent -Patient has been more somnolent and not as responsive the last few days; persistently worsened and sodium is trending up -She will wake up briefly but then closed her eyes and answer some questions but falls back to sleep -We will discontinue all sedative medications including tizanidine, pregabalin, and trazodone -Check an ABG and showed a pH of 7.486, PCO2 of 43.4, P O2 of 80.7, bicarbonate level 32.4, and an ABG O2 saturation of 96.4% on 21% FiO2 -Checked a stat head CT which showed no acute finding and mild atrophy -Also check an MRI of the brain without contrast given her renal function and shows ". No acute intracranial abnormality. Stable white matter signal changes since 2016, nonspecific but most commonly due to chronic small vessel disease.  Mild to moderate generalized cerebral  volume loss since 2006." -We will also check an EEG which is done and was read as "This is a markedly abnormal electroencephalogram secondary to a slow and disorganized background with frequent diffusely distributed triphasic waves.  This finding is consistent with a severe encephalopathy, etiologically nonspecific." -Neurology formally see the patient today -The patient remains significantly drowsy and somnolent we will escalate her level of care to a progressive status bed -For now neurology recommends correcting metabolic derangements and treating metabolic acidosis  DVT prophylaxis: Heparin 5,000 units sq q8h Code Status: FULL CODE  Family Communication: No family present at bedside  Disposition Plan: Pending further improvement back to baseline and evaluation by PT/OT; Refusing SNF and will go home with home health and will continue antibiotics today and stop tomorrow and repeat blood work and imaging in the a.m.  Consultants:   Nephrology  Neurology  Discussed the case with Urology Dr.  MaryEllen Pace   Procedures: None   Antimicrobials:  Anti-infectives (From admission, onward)   Start     Dose/Rate Route Frequency Ordered Stop   01/16/19 1630  cefTRIAXone (ROCEPHIN) 1 g in sodium chloride 0.9 % 100 mL IVPB     1 g 200 mL/hr over 30 Minutes Intravenous Every 24 hours 01/16/19 1608     01/14/19 0600  vancomycin (VANCOCIN) IVPB 1000 mg/200 mL premix  Status:  Discontinued     1,000 mg 200 mL/hr over 60 Minutes Intravenous Every 48 hours 01/12/19 1041 01/15/19 0816   01/12/19 0445  vancomycin (VANCOCIN) 1,500 mg in sodium chloride 0.9 % 500 mL IVPB     1,500 mg 250 mL/hr over 120 Minutes Intravenous NOW 01/12/19 0426 01/12/19 0750   01/11/19 2200  hydroxychloroquine (PLAQUENIL) tablet 200 mg     200 mg Oral 2 times daily 01/11/19 2105     01/11/19 1830  vancomycin (VANCOCIN) 1,500 mg in sodium chloride 0.9 % 500 mL IVPB  Status:  Discontinued     1,500 mg 250 mL/hr over 120  Minutes Intravenous  Once 01/11/19 1817 01/12/19 0426   01/11/19 1830  ceFEPIme (MAXIPIME) 2 g in sodium chloride 0.9 % 100 mL IVPB  Status:  Discontinued     2 g 200 mL/hr over 30 Minutes Intravenous Every 24 hours 01/11/19 1817 01/16/19 0928   01/11/19 1818  vancomycin variable dose per unstable renal function (pharmacist dosing)  Status:  Discontinued      Does not apply See admin instructions 01/11/19 1818 01/15/19 0816   01/10/19 1845  cefTRIAXone (ROCEPHIN) 1 g in sodium chloride 0.9 % 100 mL IVPB     1 g 200 mL/hr over 30 Minutes Intravenous  Once 01/10/19 1835 01/10/19 2004     Subjective: Seen and examined at bedside remains significantly somnolent and drowsy.  Wakes up slightly but goes right back to bed.  Have to vigorously sternal rub her to open her eyes and she will say 1 or 2 words and then go back to sleep.  Nursing reports that her Foley came out earlier.  No other concerns or complaints at this time.  Objective: Vitals:   01/17/19 0343 01/17/19 0817 01/17/19 1200 01/17/19 1521  BP:  (!) 161/108 132/81 133/77  Pulse:  88 96 97  Resp:  '16 19 20  ' Temp:  97.8 F (36.6 C)  98.2 F (36.8 C)  TempSrc:  Axillary  Axillary  SpO2:  100% 100% 96%  Weight: 83.4 kg     Height:        Intake/Output Summary (Last 24 hours) at 01/17/2019 1716 Last data filed at 01/16/2019 2226 Gross per 24 hour  Intake 1200 ml  Output --  Net 1200 ml   Filed Weights   01/15/19 0500 01/16/19 0507 01/17/19 0343  Weight: 87.1 kg 84.5 kg 83.4 kg   Examination: Physical Exam:  Constitutional: WN/WD OBESE AFRICAN-AMERICAN FEMALE CURRENTLY REMAINS SIGNIFICANTLY SOMNOLENT AND DROWSY Eyes:  Lids and conjunctivae normal, sclerae anicteric  ENMT: External Ears, Nose appear normal.  Neck: Appears normal, supple, no cervical masses, normal ROM, no appreciable thyromegaly; no JVD Respiratory: Diminished to auscultation bilaterally with coarse breath sounds, no wheezing, rales, rhonchi or crackles.  Normal respiratory effort and patient is not tachypenic. No accessory muscle use.  Unlabored breathing Cardiovascular: RRR, no murmurs / rubs / gallops. S1 and S2 auscultated.  Trace extremity edema.  Abdomen: Soft, non-tender,Distended. Bowel sounds positive x4.  GU: Deferred. Musculoskeletal: No clubbing /  cyanosis of digits/nails. No joint deformity upper and lower extremities..  Skin: As some lower extremity skin changes in the setting of her lupus disease. No induration; Warm and dry.  Neurologic: Extremely somnolent and does not really wake up to verbal commands but has to be woken up with vigorous physical stimuli and sternal rub Psychiatric: Impaired judgment and insight.  Somnolent and drowsy and she is not alert or oriented x3.   Data Reviewed: I have personally reviewed following labs and imaging studies  CBC: Recent Labs  Lab 01/14/19 0135 01/15/19 0234 01/15/19 1815 01/16/19 0227 01/17/19 0941  WBC 30.6* 17.2* 13.4* 18.4* 15.3*  NEUTROABS 29.2* 13.2* 12.3* 15.3* 12.0*  HGB 7.6* 7.7* 9.1* 8.2* 8.8*  HCT 23.1* 23.8* 28.2* 24.8* 28.7*  MCV 68.5* 69.2* 70.0* 69.1* 73.4*  PLT 270 329 371 318 341   Basic Metabolic Panel: Recent Labs  Lab 01/14/19 0135 01/15/19 0234 01/15/19 1815 01/16/19 0227 01/17/19 0941  NA 138 141   140 142 146* 148*  K 4.0 3.0*   3.0* 3.6 3.1* 4.1  CL 106 101   99 100 107 107  CO2 '22 29   30 27 30 28  ' GLUCOSE 119* 72   73 105* 91 66*  BUN 37* 38*   35* 34* 34* 33*  CREATININE 2.92* 2.70*   2.74* 2.83* 2.84* 3.03*  CALCIUM 8.1* 7.8*   7.8* 8.6* 8.2* 8.5*  MG 1.7 1.5* 2.3 2.0 1.8  PHOS 3.3 2.7   2.6 2.2* 2.1* 2.3*   GFR: Estimated Creatinine Clearance: 19.3 mL/min (A) (by C-G formula based on SCr of 3.03 mg/dL (H)). Liver Function Tests: Recent Labs  Lab 01/14/19 0135 01/15/19 0234 01/15/19 1815 01/16/19 0227 01/17/19 0941  AST 14* '15 18 16 21  ' ALT '14 15 16 15 17  ' ALKPHOS 86 79 93 76 83  BILITOT 0.6 0.6 0.9 0.7 0.9  PROT 5.4* 5.1*  6.4* 5.3* 5.3*  ALBUMIN 2.4*   2.4* 2.4*   2.3* 2.9* 2.4* 2.5*   No results for input(s): LIPASE, AMYLASE in the last 168 hours. Recent Labs  Lab 01/17/19 1146  AMMONIA 14   Coagulation Profile: No results for input(s): INR, PROTIME in the last 168 hours. Cardiac Enzymes: No results for input(s): CKTOTAL, CKMB, CKMBINDEX, TROPONINI in the last 168 hours. BNP (last 3 results) No results for input(s): PROBNP in the last 8760 hours. HbA1C: No results for input(s): HGBA1C in the last 72 hours. CBG: Recent Labs  Lab 01/16/19 0455  GLUCAP 80   Lipid Profile: No results for input(s): CHOL, HDL, LDLCALC, TRIG, CHOLHDL, LDLDIRECT in the last 72 hours. Thyroid Function Tests: Recent Labs    01/15/19 1815  TSH 3.797   Anemia Panel: No results for input(s): VITAMINB12, FOLATE, FERRITIN, TIBC, IRON, RETICCTPCT in the last 72 hours. Sepsis Labs: Recent Labs  Lab 01/11/19 1802 01/11/19 1807 01/11/19 2004 01/15/19 1815 01/16/19 0227 01/17/19 0941  PROCALCITON 3.53  --   --  0.62 0.65 0.54  LATICACIDVEN  --  0.9 1.1  --   --   --     Recent Results (from the past 240 hour(s))  SARS Coronavirus 2 Ag (30 min TAT) - Nasal Swab (BD Veritor Kit)     Status: None   Collection Time: 01/10/19  3:49 PM   Specimen: Nasal Swab (BD Veritor Kit)  Result Value Ref Range Status   SARS Coronavirus 2 Ag NEGATIVE NEGATIVE Final    Comment: (NOTE) SARS-CoV-2 antigen NOT DETECTED.  Negative results are presumptive.  Negative results do not preclude SARS-CoV-2 infection and should not be used as the sole basis for treatment or other patient management decisions, including infection  control decisions, particularly in the presence of clinical signs and  symptoms consistent with COVID-19, or in those who have been in contact with the virus.  Negative results must be combined with clinical observations, patient history, and epidemiological information. The expected result is Negative. Fact Sheet  for Patients: PodPark.tn Fact Sheet for Healthcare Providers: GiftContent.is This test is not yet approved or cleared by the [place] FDA and  has been authorized for detection and/or diagnosis of SARS-CoV-2 by FDA under an Emergency Use Authorization (EUA).  This EUA will remain in effect (meaning this test can be used) for the duration of  the COVID-19 de claration under Section 564(b)(1) of the Act, 21 U.S.C. section 360bbb-3(b)(1), unless the authorization is terminated or revoked sooner. Performed at [facility], [address] [place], [place] [address]   SARS CORONAVIRUS 2 (TAT 6-24 HRS) Nasopharyngeal Urine, Catheterized     Status: None   Collection Time: 01/10/19  6:03 PM   Specimen: Urine, Catheterized; Nasopharyngeal  Result Value Ref Range Status   SARS Coronavirus 2 NEGATIVE NEGATIVE Final    Comment: (NOTE) SARS-CoV-2 target nucleic acids are NOT DETECTED. The SARS-CoV-2 RNA is generally detectable in upper and lower respiratory specimens during the acute phase of infection. Negative results do not preclude SARS-CoV-2 infection, do not rule out co-infections with other pathogens, and should not be used as the sole basis for treatment or other patient management decisions. Negative results must be combined with clinical observations, patient history, and epidemiological information. The expected result is Negative. Fact Sheet for Patients: SugarRoll.be Fact Sheet for Healthcare Providers: https://www.woods-mathews.com/ This test is not yet approved or cleared by the [place] FDA and  has been authorized for detection and/or diagnosis of SARS-CoV-2 by FDA under an Emergency Use Authorization (EUA). This EUA will remain  in effect (meaning this test can be used) for the duration of the COVID-19 declaration under Section 56 4(b)(1) of the Act, 21  U.S.C. section 360bbb-3(b)(1), unless the authorization is terminated or revoked sooner. Performed at [facility], [place] [address], [place], [place] [address]   Culture, blood (routine x 2)     Status: None   Collection Time: 01/11/19  6:02 PM   Specimen: BLOOD RIGHT HAND  Result Value Ref Range Status   Specimen Description BLOOD RIGHT HAND  Final   Special Requests   Final    BOTTLES DRAWN AEROBIC ONLY Blood Culture results may not be optimal due to an inadequate volume of blood received in culture bottles   Culture   Final    NO GROWTH 5 DAYS Performed at [facility], [place] [address], [place], [place] [address]    Report Status 01/16/2019 FINAL  Final  Culture, blood (routine x 2)     Status: None   Collection Time: 01/11/19  6:07 PM   Specimen: BLOOD LEFT HAND  Result Value Ref Range Status   Specimen Description BLOOD LEFT HAND  Final   Special Requests   Final    BOTTLES DRAWN AEROBIC ONLY Blood Culture results may not be optimal due to an inadequate volume of blood received in culture bottles   Culture   Final    NO GROWTH 5 DAYS Performed at [facility], [place],  [place] [address]    Report Status 01/16/2019 FINAL  Final  Urine culture     Status: None   Collection Time: 01/12/19  5:10 PM   Specimen: Urine, Clean Catch  Result Value Ref Range Status   Specimen Description URINE, CLEAN CATCH  Final   Special Requests Immunocompromised  Final   Culture   Final    NO GROWTH Performed at [facility], [place] [address] [place], [place] [address]    Report Status 01/13/2019 FINAL  Final  MRSA PCR Screening     Status: None   Collection Time: 01/12/19 11:05 PM   Specimen: Nasopharyngeal  Result Value Ref Range Status   MRSA by PCR NEGATIVE NEGATIVE Final    Comment:        The GeneXpert MRSA Assay (FDA approved for NASAL specimens only), is one component of a comprehensive MRSA colonization surveillance program. It is  not intended to diagnose MRSA infection nor to guide or monitor treatment for MRSA infections. Performed at [facility], [place] [address], [place], [place] [address]     Radiology Studies: Ct Abdomen Pelvis Wo Contrast  Result Date: 01/15/2019 CLINICAL DATA:  63 year old female with abdominal distention, nausea vomiting. EXAM: CT ABDOMEN AND PELVIS WITHOUT CONTRAST TECHNIQUE: Multidetector CT imaging of the abdomen and pelvis was performed following the standard protocol without IV contrast. COMPARISON:  CT abdomen pelvis dated 01/12/2019. FINDINGS: Evaluation of this exam is limited in the absence of intravenous contrast. Evaluation is also limited due to streak artifact caused by patient's arms. Lower chest: Partially visualized small left pleural effusion with left lung base partial atelectasis versus infiltrate. Clinical correlation is recommended. There is a 7 mm subpleural nodule at the right lung base (series 5 image 28) similar to the study of 2019. There is hypoattenuation of the cardiac blood pool suggestive of a degree of anemia. Clinical correlation is recommended. No intra-abdominal free air or free fluid. Hepatobiliary: The liver is grossly unremarkable. Minimal layering sludge may be present within the gallbladder. No pericholecystic fluid or evidence of acute cholecystitis by CT. Pancreas: The pancreas is mildly atrophic. No active inflammatory changes. 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 Adrenals/Urinary Tract: Status post prior left nephrectomy. There is mild right hydronephrosis. Right perinephric stranding noted. Correlation with urinalysis recommended to exclude UTI. No obstructing stone identified. The urinary bladder is grossly unremarkable. Stomach/Bowel: There is no bowel obstruction or active inflammation. The appendix is normal. Vascular/Lymphatic: Moderate aortoiliac atherosclerotic disease. The IVC is grossly unremarkable. No  portal venous gas. There is no adenopathy. Reproductive: Probable partially calcified exophytic fibroid from the uterine fundus. No adnexal masses. Other: Mild diffuse subcutaneous edema. No drainable fluid collection. Musculoskeletal: Avascular necrosis of the femoral heads bilaterally. There is irregularity of the right femoral head consistent with subcortical collapse, chronic. Severe degenerative changes at L4-L5 and L5-S1. There is grade 2 L4-L5 anterolisthesis and grade 1 L5-S1 retrolisthesis. There is associated moderate narrowing of the central canal at this level. No acute osseous pathology. IMPRESSION: 1. Small left pleural effusion with left lung base partial atelectasis versus infiltrate. Clinical correlation is recommended. 2. Mild right hydronephrosis, new since the prior CT. There is right perinephric stranding. Correlation with urinalysis recommended to exclude UTI. 3. No bowel obstruction or active inflammation. Normal appendix. 4. Aortic Atherosclerosis (ICD10-I70.0). 5. Lower lumbar listhesis and degenerative changes with associated narrowing of the central canal noted Electronically Signed   By: Laren Everts.D.  On: 01/15/2019 22:48   Dg Abd 1 View  Result Date: 01/15/2019 CLINICAL DATA:  Abdominal pain. EXAM: ABDOMEN - 1 VIEW COMPARISON:  CT scan January 15, 2019 FINDINGS: The lung bases are unremarkable. No free air, portal venous gas, or pneumatosis. Contrast is seen throughout the colon. No evidence of bowel obstruction. No other acute abnormalities identified. IMPRESSION: No acute abnormality seen.  No evidence of bowel obstruction. Electronically Signed   By: Dorise Bullion III M.D   On: 01/15/2019 22:58   Ct Head Wo Contrast  Result Date: 01/16/2019 CLINICAL DATA:  Unexplained altered level of consciousness. EXAM: CT HEAD WITHOUT CONTRAST TECHNIQUE: Contiguous axial images were obtained from the base of the skull through the vertex without intravenous contrast. COMPARISON:   11/05/2015 FINDINGS: Brain: Some motion degradation. Mild atrophy as seen previously. No sign of acute infarction, mass lesion, hemorrhage, hydrocephalus or extra-axial collection. Vascular: There is atherosclerotic calcification of the major vessels at the base of the brain. Skull: Negative Sinuses/Orbits: Clear/normal Other: None IMPRESSION: No acute finding.  Mild atrophy. Electronically Signed   By: Nelson Chimes M.D.   On: 01/16/2019 13:42   Mr Brain Wo Contrast  Result Date: 01/16/2019 CLINICAL DATA:  63 year old female with unexplained altered mental status. History of renal cell carcinoma status post nephrectomy, and lupus on chronic immunosuppressive therapy. EXAM: MRI HEAD WITHOUT CONTRAST TECHNIQUE: Multiplanar, multiecho pulse sequences of the brain and surrounding structures were obtained without intravenous contrast. COMPARISON:  Head CT earlier today. [facility]. Brain MRI 07/27/2014 FINDINGS: Brain: Generalized cerebral volume loss since 2016, mild-to-moderate. No restricted diffusion to suggest acute infarction. No midline shift, mass effect, evidence of mass lesion, ventriculomegaly, extra-axial collection or acute intracranial hemorrhage. Cervicomedullary junction and pituitary are within normal limits. Scattered mostly subcortical white matter T2 and FLAIR hyperintensity is not significantly changed since 2016. No superimposed cortical encephalomalacia or chronic cerebral blood products. Deep gray matter nuclei, brainstem and cerebellum appear negative. Vascular: Major intracranial vascular flow voids are stable since 2016. Chronic generalized intracranial artery tortuosity. Skull and upper cervical spine: Normal visible cervical spine. Visualized bone marrow signal is within normal limits. Sinuses/Orbits: Negative orbits. Trace paranasal sinus mucosal thickening is stable. Other: Trace left mastoid effusion Right mastoids has regressed remain clear.  Negative visible internal auditory structures. Scalp and face soft tissues appear negative. IMPRESSION: 1. No acute intracranial abnormality. 2. Stable white matter signal changes since 2016, nonspecific but most commonly due to chronic small vessel disease. 3. Mild to moderate generalized cerebral volume loss since 2006. Electronically Signed   By: Genevie Ann M.D.   On: 01/16/2019 14:50   Scheduled Meds:  Chlorhexidine Gluconate Cloth  6 each Topical Daily   darbepoetin (ARANESP) injection - NON-DIALYSIS  150 mcg Subcutaneous Q Fri-1800   DULoxetine  60 mg Oral BID   heparin  5,000 Units Subcutaneous Q8H   hydroxychloroquine  200 mg Oral BID   levothyroxine  25 mcg Oral Q0600   lidocaine  1 patch Transdermal Q24H   pantoprazole  40 mg Oral BID   polyethylene glycol  17 g Oral BID   predniSONE  30 mg Oral Q breakfast   senna-docusate  1 tablet Oral BID   Continuous Infusions:  cefTRIAXone (ROCEPHIN)  IV 1 g (01/17/19 1659)   dextrose 100 mL/hr at 01/17/19 1631    LOS: 6 days   Kerney Elbe, DO [facility] PAGER is on AMION  If 7PM-7AM, please contact night-coverage [email or web address]

## 2019-01-17 NOTE — Care Management Important Message (Signed)
Important Message  Patient Details  Name: Carolyn Zamora MRN: [id number] Date of Birth: May 14, 1955   Medicare Important Message Given:  Yes     Raghad Lorenz [address] 01/17/2019, 4:41 PM

## 2019-01-17 NOTE — Progress Notes (Signed)
RN paged MD about seizure like activity. Pt BLE twitching and eyes devatated to the left. Pt unable to answer questions during episode. O2 decreased to 88% during episode.

## 2019-01-17 NOTE — Consult Note (Signed)
Neurology Consultation  Reason for Consult: Altered mental status  Referring Physician: Dr. Alfredia Ferguson  History is obtained from: Chart  HPI: Carolyn Zamora is a 63 y.o. female with past medical history of renal cell carcinoma status post left nephrectomy, history of splenectomy, CKD stage III, chronic pain syndrome, morbid obesity, lupus on chronic immunosuppressive therapy (mycophenolate, hydroxychloroquine, prednisone), who came from [facility] for the concern of sepsis secondary to UTI and worsening of kidney function.  Patient was transferred to [facility].  At that time she was noted to be tachycardic, tachypneic, with leukocytosis of 20.3, UA positive for small number of leukocytes.  Patient was Covid-19 negative. She had evidence for worsening kidney function on CMP.  Patient received IV fluids and Rocephin for possible sepsis.  Serological work-up was negative for lupus flare.  Patient's Lyrica was discontinued 4 days ago and trazodone discontinued 3 days ago.   Past Medical History:  Diagnosis Date  . Abdominal wall hematoma 08/01/2017  . Anemia   . AVN (avascular necrosis of bone) ([place]) 08/01/2017  . CKD (chronic kidney disease), stage IV ([place])   . History of dental abscess with sepsis 2017 08/02/2017  . History of kidney cancer   . Hypertension   . Lupus ([place])   . 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 Ventral hernia without obstruction or gangrene 10/24/2015    Family History  Problem Relation Age of Onset  . Hypertension Mother   . Diabetes Mellitus I Mother   . Lung cancer Father   . Hypertension Sister   . Colon cancer Brother    Social History:   reports that she has never smoked. She has never used smokeless tobacco. She reports that she does not drink alcohol or use drugs.  Medications  Current Facility-Administered Medications:  .  acetaminophen (TYLENOL) tablet 650 mg, 650 mg, Oral, Q6H PRN **OR** acetaminophen  (TYLENOL) suppository 650 mg, 650 mg, Rectal, Q6H PRN, Pahwani, Rinka R, MD .  acetaminophen (TYLENOL) tablet 650 mg, 650 mg, Oral, N0N PRN, Delora Fuel, MD, 397 mg at 01/12/19 0543 .  cefTRIAXone (ROCEPHIN) 1 g in sodium chloride 0.9 % 100 mL IVPB, 1 g, Intravenous, Q24H, Sheikh, Omair Smock, DO, Last Rate: 200 mL/hr at 01/16/19 1805, 1 g at 01/16/19 1805 .  Chlorhexidine Gluconate Cloth 2 % PADS 6 each, 6 each, Topical, Daily, Sheikh, [place], DO .  Darbepoetin Alfa (ARANESP) injection 150 mcg, 150 mcg, Subcutaneous, Q Fri-1800, Elmarie Shiley, MD, 150 mcg at 01/14/19 1852 .  DULoxetine (CYMBALTA) DR capsule 60 mg, 60 mg, Oral, BID, Raiford Noble Mamers, DO, 60 mg at 01/15/19 6734 .  heparin injection 5,000 Units, 5,000 Units, Subcutaneous, Q8H, Pahwani, Rinka R, MD, 5,000 Units at 01/17/19 0540 .  HYDROcodone-acetaminophen (NORCO/VICODIN) 5-325 MG per tablet 1 tablet, 1 tablet, Oral, Q8H PRN, Raiford Noble [place], DO, 1 tablet at 01/15/19 1937 .  HYDROmorphone (DILAUDID) injection 1 mg, 1 mg, Intravenous, Q4H PRN, Pahwani, Rinka R, MD, 1 mg at 01/15/19 1130 .  hydroxychloroquine (PLAQUENIL) tablet 200 mg, 200 mg, Oral, BID, Madelon Lips, MD, 200 mg at 01/15/19 9024 .  levothyroxine (SYNTHROID) tablet 25 mcg, 25 mcg, Oral, Q0600, Raiford Noble [place], DO, 25 mcg at 01/17/19 0540 .  lidocaine (LIDODERM) 5 % 1 patch, 1 patch, Transdermal, Q24H, Raiford Noble [place], [place], 1 patch at 01/14/19 1849 .  ondansetron (ZOFRAN) tablet 4 mg, 4 mg, Oral, Q6H PRN **OR** ondansetron (ZOFRAN) injection 4 mg, 4 mg,  Intravenous, Q6H PRN, Pahwani, Rinka R, MD, 4 mg at 01/16/19 0546 .  pantoprazole (PROTONIX) EC tablet 40 mg, 40 mg, Oral, BID, Raiford Noble [place], DO, 40 mg at 01/15/19 4268 .  polyethylene glycol (MIRALAX / GLYCOLAX) packet 17 g, 17 g, Oral, BID, Sheikh, Omair Latif, DO .  predniSONE (DELTASONE) tablet 30 mg, 30 mg, Oral, Q breakfast, Sheikh, Omair Latif, DO .  prochlorperazine (COMPAZINE) injection  10 mg, 10 mg, Intravenous, Q6H PRN, Raiford Noble Latif, DO, 10 mg at 01/16/19 0108 .  senna-docusate (Senokot-S) tablet 1 tablet, 1 tablet, Oral, BID, Raiford Noble [place], DO  Exam: Current vital signs: BP (!) 161/108 (BP Location: Left Wrist)   Pulse 88   Temp 97.8 F (36.6 C)   Resp 16   Ht 5\' 2"  (1.575 m)   Wt 83.4 kg   SpO2 100%   BMI 33.63 kg/m  Vital signs in last 24 hours: Temp:  [97.8 F (36.6 C)-98.2 F (36.8 C)] 97.8 F (36.6 C) (12/07 0817) Pulse Rate:  [87-90] 88 (12/07 0817) Resp:  [16-20] 16 (12/07 0817) BP: (149-169)/(83-108) 161/108 (12/07 0817) SpO2:  [98 %-100 %] 100 % (12/07 0817) Weight:  [83.4 kg] 83.4 kg (12/07 0343)  ROS:  Unable to obtain due to altered mental status.     Physical Exam   Constitutional: Appears well-developed and well-nourished.  Psych: Somnolent Eyes: No scleral injection HENT: No OP obstrucion. No nuchal rigidity.  Head: Normocephalic.  Cardiovascular:  regular rhythm.  Respiratory: Effort normal, non-labored breathing GI: Soft.  No distension.   Skin: WDI, intermittent lesions throughout looks like scabs that have healed  Neuro: Mental Status: Patient is awake with eyes open, but not attending to external stimuli.  Intermittent yawning is noted throughout the exam. Will not respond to verbal stimuli, or follow commands.  Patient does withdraw from pain vigorously in all extremities and somewhat localizes to sternal rub. Also calls out incoherently to pain and on one occasion briefly glanced at examiner during sternal rub. She has moderate agitation at baseline, which increases with noxious stimuli.  Cranial Nerves: II: Positive blink to threat. PERRL.  III,IV, VI: Doll's eye reflexes intact.  Eyes are slightly disconjugate. Will gaze to left and right spontaneously.  V: Facial sensation is symmetric to tactile stimuli VII: Facial movement is symmetric.  VIII: No response to voice X: Unable to visualize palate XI: Rolls  head from side to side when agitated, without asymmetry XII: Tongue is midline without atrophy or fasciculations.  Motor: Moves all extremities antigravity, especially to pain.  Sensory: Sensation is symmetric to noxious stimuli Deep Tendon Reflexes: 2+ reflexes in BUE. Hyperactive patellar reflexes bilaterally.  Plantars: Toes are downgoing bilaterally.  Cerebellar/Gait: Unable to assess   Labs/diagnostic tests I have reviewed labs in Epic and the results pertinent to this consultation are:  -White blood cell count-3 days ago 30.6, 2 days ago 17.2, 1 day ago 18.4--- predominantly neutrophils --Patient BUN/creatinine went from 51/4.42 -> 45/4.09 -> 44/4.20 -> 40/3.39 -> 37/2.92 -> 35/2.70 -> 34/2.83 -> 34/2.84 -Urine culture negative -ANA with reflex negative -Anti--DNA antibody negative -C4 complement within normal limits -C3 complement within normal limits -C-reactive protein 6 days ago 20.3  EEG-markedly abnormal electroencephalogram secondary to slow and disorganized background with frequent diffuse disruptive triphasic waves.  This finding is consistent with severe encephalopathy.   Ct Head Wo Contrast Result Date: 01/16/2019 CLINICAL DATA:  Unexplained altered level of consciousness. EXAM: CT HEAD WITHOUT CONTRAST MPRESSION: No acute finding.  Mild atrophy. Electronically Signed   By: Nelson Chimes M.D.   On: 01/16/2019 13:42   Mr Brain Wo Contrast Result Date: 01/16/2019 . IMPRESSION: 1. No acute intracranial abnormality. 2. Stable white matter signal changes since 2016, nonspecific but most commonly due to chronic small vessel disease. 3. Mild to moderate generalized cerebral volume loss since 2006. Electronically Signed   By: Genevie Ann M.D.   On: 01/16/2019 14:50    CBC    Component Value Date/Time   WBC 18.4 (H) 01/16/2019 0227   RBC 3.59 (L) 01/16/2019 0227   HGB 8.2 (L) 01/16/2019 0227   HCT 24.8 (L) 01/16/2019 0227   PLT 318 01/16/2019 0227   MCV 69.1 (L)  01/16/2019 0227   MCH 22.8 (L) 01/16/2019 0227   MCHC 33.1 01/16/2019 0227   RDW 14.3 01/16/2019 0227   LYMPHSABS 1.6 01/16/2019 0227   MONOABS 1.2 (H) 01/16/2019 0227   EOSABS 0.0 01/16/2019 0227   BASOSABS 0.0 01/16/2019 0227    CMP     Component Value Date/Time   NA 146 (H) 01/16/2019 0227   K 3.1 (L) 01/16/2019 0227   CL 107 01/16/2019 0227   CO2 30 01/16/2019 0227   GLUCOSE 91 01/16/2019 0227   BUN 34 (H) 01/16/2019 0227   CREATININE 2.84 (H) 01/16/2019 0227   CALCIUM 8.2 (L) 01/16/2019 0227   PROT 5.3 (L) 01/16/2019 0227   ALBUMIN 2.4 (L) 01/16/2019 0227   AST 16 01/16/2019 0227   ALT 15 01/16/2019 0227   ALKPHOS 76 01/16/2019 0227   BILITOT 0.7 01/16/2019 0227   GFRNONAA 17 (L) 01/16/2019 0227   GFRAA 20 (L) 01/16/2019 0227    Lipid Panel  No results found for: CHOL, TRIG, HDL, CHOLHDL, VLDL, LDLCALC, LDLDIRECT  EEG 12/6: This is a markedly abnormal electroencephalogram secondary to a slow and disorganized background with frequent diffusely distributed triphasic waves.  This finding is consistent with a severe encephalopathy, etiologically nonspecific  MRI brain 12/6:  1. No acute intracranial abnormality. 2. Stable white matter signal changes since 2016, nonspecific but most commonly due to chronic small vessel disease. 3. Mild to moderate generalized cerebral volume loss since 2006.  Etta Quill PA-C [facility] [phone] 01/17/2019, 10:23 AM     Assessment: 63 year old female with a complicated past medical history including renal cell carcinoma s/p left nephrectomy, lupus on chronic immunosuppressive therapy, splenectomy, chronic kidney disease and HTN who presented in transfer from O[facility] on 12/1 with concern for sepsis and worsening renal function. Neurology has been consulted for progressive worsening of mental status. On arrival she was able to converse, currently in a delirious to somnolent state. She has not improved with discontinuation of  trazodone and Lyrica 1. Multiple tests have come back negative including labs ruling out a lupus flare. Patient's MRI did not show any abnormalities. EEG showed severe encephalopathy but no seizures.   2. On exam she does show mild twitching; however, it is not rhythmic.  She continues to yawn intermittently, which is of unknown etiology but can occur with opiate withdrawal.   3. Exhibits waxing and waning of her mental status on successive exams by Neurology PA and attending.  She does quickly respond to noxious stimuli but then again goes back into a drowsy state on initial PA exam. On follow up attending exam, she is floridly delirious in a mild to moderately agitated state. Overall exam and EEG findings most consistent with a delirium.  We do question if her Lyrica and  trazodone are lingering within her system. More likely the delirium is secondary to a metabolic encephalopathy, which may take time to clear with a delay despite normalizing BUN and Cr levels. Also on the DDx would be subclinical seizures with postictal confusion, but EEG yesterday showed no electrographic seizures.  Recommendations: -Continue to correct metabolic derangements -Continue to treat metabolic acidosis -We will likely have to watch daily to see if patient improves as medications clear to her system --Repeat EEG tomorrow (ordered)  I have seen and examined the patient. I have formulated the assessment and recommendations. 63 year old female with progressive encephalopathy. Exam and EEG findings most consistent with a delirium. No neck stiffness on exam. MRI brain without acute abnormality. Will repeat EEG tomorrow.  Electronically signed: Dr. Kerney Elbe

## 2019-01-18 ENCOUNTER — Inpatient Hospital Stay (HOSPITAL_COMMUNITY): Payer: Medicare Other

## 2019-01-18 LAB — CBC WITH DIFFERENTIAL/PLATELET
Abs Immature Granulocytes: 0.45 10*3/uL — ABNORMAL HIGH (ref 0.00–0.07)
Basophils Absolute: 0.1 10*3/uL (ref 0.0–0.1)
Basophils Relative: 0 %
Eosinophils Absolute: 0.3 10*3/uL (ref 0.0–0.5)
Eosinophils Relative: 2 %
HCT: 24.6 % — ABNORMAL LOW (ref 36.0–46.0)
Hemoglobin: 7.7 g/dL — ABNORMAL LOW (ref 12.0–15.0)
Immature Granulocytes: 3 %
Lymphocytes Relative: 10 %
Lymphs Abs: 1.5 10*3/uL (ref 0.7–4.0)
MCH: 22.8 pg — ABNORMAL LOW (ref 26.0–34.0)
MCHC: 31.3 g/dL (ref 30.0–36.0)
MCV: 73 fL — ABNORMAL LOW (ref 80.0–100.0)
Monocytes Absolute: 1.1 10*3/uL — ABNORMAL HIGH (ref 0.1–1.0)
Monocytes Relative: 8 %
Neutro Abs: 11.7 10*3/uL — ABNORMAL HIGH (ref 1.7–7.7)
Neutrophils Relative %: 77 %
Platelets: 249 10*3/uL (ref 150–400)
RBC: 3.37 MIL/uL — ABNORMAL LOW (ref 3.87–5.11)
RDW: 15.2 % (ref 11.5–15.5)
WBC: 15.1 10*3/uL — ABNORMAL HIGH (ref 4.0–10.5)
nRBC: 5.4 % — ABNORMAL HIGH (ref 0.0–0.2)

## 2019-01-18 LAB — COMPREHENSIVE METABOLIC PANEL
ALT: 15 U/L (ref 0–44)
AST: 21 U/L (ref 15–41)
Albumin: 2.4 g/dL — ABNORMAL LOW (ref 3.5–5.0)
Alkaline Phosphatase: 75 U/L (ref 38–126)
Anion gap: 10 (ref 5–15)
BUN: 31 mg/dL — ABNORMAL HIGH (ref 8–23)
CO2: 32 mmol/L (ref 22–32)
Calcium: 8.3 mg/dL — ABNORMAL LOW (ref 8.9–10.3)
Chloride: 105 mmol/L (ref 98–111)
Creatinine, Ser: 3 mg/dL — ABNORMAL HIGH (ref 0.44–1.00)
GFR calc Af Amer: 19 mL/min — ABNORMAL LOW (ref >60)
GFR calc non Af Amer: 16 mL/min — ABNORMAL LOW (ref >60)
Glucose, Bld: 82 mg/dL (ref 70–99)
Potassium: 3.7 mmol/L (ref 3.5–5.1)
Sodium: 147 mmol/L — ABNORMAL HIGH (ref 135–145)
Total Bilirubin: 1 mg/dL (ref 0.3–1.2)
Total Protein: 4.9 g/dL — ABNORMAL LOW (ref 6.5–8.1)

## 2019-01-18 LAB — URINE CULTURE: Culture: NO GROWTH

## 2019-01-18 LAB — MAGNESIUM: Magnesium: 1.6 mg/dL — ABNORMAL LOW (ref 1.7–2.4)

## 2019-01-18 LAB — PATHOLOGIST SMEAR REVIEW

## 2019-01-18 LAB — PHOSPHORUS: Phosphorus: 2.4 mg/dL — ABNORMAL LOW (ref 2.5–4.6)

## 2019-01-18 MED ORDER — SODIUM CHLORIDE 0.9 % IV SOLN
1500.0000 mg | Freq: Once | INTRAVENOUS | Status: AC
  Administered 2019-01-18: 1500 mg via INTRAVENOUS
  Filled 2019-01-18: qty 30

## 2019-01-18 MED ORDER — MAGNESIUM SULFATE IN D5W 1-5 GM/100ML-% IV SOLN
1.0000 g | Freq: Once | INTRAVENOUS | Status: AC
  Administered 2019-01-18: 1 g via INTRAVENOUS
  Filled 2019-01-18: qty 100

## 2019-01-18 MED ORDER — POTASSIUM PHOSPHATES 15 MMOLE/5ML IV SOLN
10.0000 mmol | Freq: Once | INTRAVENOUS | Status: AC
  Administered 2019-01-18: 10 mmol via INTRAVENOUS
  Filled 2019-01-18: qty 3.33

## 2019-01-18 MED ORDER — PHENYTOIN SODIUM 50 MG/ML IJ SOLN
100.0000 mg | Freq: Three times a day (TID) | INTRAMUSCULAR | Status: DC
  Administered 2019-01-19 – 2019-01-23 (×11): 100 mg via INTRAVENOUS
  Filled 2019-01-18 (×17): qty 2

## 2019-01-18 NOTE — Progress Notes (Signed)
Charge RN and MD updated on the status of this pt. Pt has had decreased LOC for 2 days. Pulse elevated into the low 100s. Abnormally high readings come from the pt having seizure like activity, the monitor alarms VT during these episodes. RN requested to follow up with MD if seizure like activity last for > 30 seconds.

## 2019-01-18 NOTE — Progress Notes (Signed)
PROGRESS NOTE    Carolyn Zamora  DVV:[id number] DOB: 06/19/1955 DOA: 01/10/2019 PCP: Beckie Salts, MD   Brief Narrative:  HPI per Dr. Early Osmond on 01/11/2019 Carolyn Zamora is a 63 y.o. female with medical history significant of renal cell carcinoma status post left nephrectomy, history of splenectomy, CKD stage III, chronic pain syndrome, morbid obesity, lupus-on chronic immunosuppressive therapy (mycophenolate, hydroxychloroquine, prednisone), hypertension, anemia of chronic disease came from [place] for the concern of sepsis secondary to UTI and worsening kidney function.  Patient tells me that she has feet pain which is severe in intensity since 5 days.  Unable to walk/move, could not sleep at night, no aggravating or relieving factors, associated with chills denies association with trauma, fever, decreased appetite, generalized weakness or lethargy.  Had history of gout however currently not on any medicines.  X-ray of right foot obtained at [facility]-which came back negative.  She reports lower abdominal pain and chronic back pain however denies urinary symptoms such as dysuria, hematuria, foul-smelling urine, nocturia, nausea, vomiting.  She lives alone at home and her daughter and sister helps her.  No history of smoking, alcohol, illicit drug use.  At [facility]: Patient was tachycardic, tachypneic, leukocytosis of 20.3, UA positive for small leukocytes.  CK, lipase level: WNL, COVID-19 negative, magnesium: Low-- was replaced.  CMP shows worsening kidney function.  Patient received IV fluids and Rocephin for the concern of urosepsis.  Patient transferred to [facility] for further evaluation and management.  **Interim History Nephrology does not feel that she has a lupus flare but her stress dosing her steroids and checking complement level and serological work-up which was negative for Lupus flare.  WBC elevated likely in the setting of her steroids and is  slowly trending down slightly.  Patient complaining of foot pain still but this is improving after being on Steroids.  No evidence of DVT on lower extremity Dopplers.  Patient's hemoglobin is also trending downward so nephrology will start ESA.  Patient had complained of some foot pain and yesterday her IV infiltrated and had some extravasation of her vancomycin.  IV was removed and applied warm compresses.  IV vancomycin has been stopped given her negative MRSA PCR. PT/OT recommending SNF but Patient refusing and wanting to go Home.  Stress dose steroids have been tapered down to prednisone 40 mg and will be further tapered down to 5 mg prednisone daily to her baseline dose.  Renal function is still improving and nephrology stopped her fluids yesterday but I restarted him last night as patient became nauseous with dry heaving and complaining of abdominal pain.  CT scan of the abdomen pelvis was done which showed a small left pleural effusion with the left lung base partial atelectasis versus infiltrate.  She had mild right hydronephrosis which was new and no bowel obstruction or active inflammation.  Patient has been persistently drowsy and somnolent so we obtained an ABG, head CT scan without contrast, and MRI as well as an EEG and her pregabalin, tizanidine and trazodone were all discontinued. Case was also discussed with Neurology and they will formally consulted yesterday.  Case was also discussed with urology who will review the new CT scan and make recommendations for the new hydronephrosis and perinephric stranding and in the interim we will check a bladder scan and insert a Foley catheter and restart antibiotics with IV ceftriaxone.  Unfortunately Foley catheter came out and was reinserted.  Because patient persistently has not improving she was moved  to stepdown/progressive bed status.  Overnight nursing reported seizure-like activity and the nurse this morning stated it was tonic-clonic jerking which  was not lasting more than 30 seconds.  Neurology is repeating an EEG and likely doing LTM and they have consulted the patient with fosphenytoin.  She is more awake today than she was been but she still remains significantly confused  Assessment & Plan:   Principal Problem:   Sepsis ([place]) Active Problems:   Hypertension   Systemic lupus erythematosus ([place])   Chronic pain syndrome   Renal cell carcinoma of left kidney s/p nephrectomy   Anemia of chronic disease   Renal failure (ARF), acute on chronic (HCC)   Pain in both feet   Hypomagnesemia  Sepsis of Unknown Etiology but likely in the setting of Pyelonephritis and Complicated by Gout fFare -Denied any urinary symptoms but has had Nausea, Dry Heaving and Abdominal Pain. Initial UA is positive only for trace leukocytes. -UA was repeated and showed a hazy appearance with small hemoglobin, large leukocytes, negative nitrites, many bacteria, 21-50 WBCs, 0-5 red blood cells per high-power field, and repeat urine culture showed no growth but patient had already been on antibiotics -MRSA PCR was negative and now I have stopped IV vancomycin.  Unfortunately yesterday her IV infiltrated and she had some extravasation of the vancomycin and now has a swollen left arm -CT Abd/Pelvis showed "Patchy airspace opacity seen at both lung bases which is non-specific could be related to early infectious etiology and/or atelectasis. Mild inflammatory changes surrounding the right kidney which could be due to pyelonephritis. No right-sided renal or collecting system calculi. Aortic Atherosclerosis." -Patient is afebrile, tachycardic (HR still on the faster side), tachypneic, leukocytosis of 20.3-->Trended up to 28.5 and worsened to 36.6 but  Related to Steroids; now WBC is trended down and is going from 30,600 and now is further trended down to 13,400 but had a slight bump with at Leukocytosis of 18.4 -> 15.3 -> 15.1 -KUB showed "No acute abnormality seen.  No  evidence of bowel obstruction." -Repeat CT Scan  On 01/15/2019 showed "Small left pleural effusion with left lung base partial atelectasis versus infiltrate. Clinical correlation is recommended. Mild right hydronephrosis, new since the prior CT. There is right perinephric stranding. Correlation with urinalysis recommended to exclude UTI. No bowel obstruction or active inflammation. Normal appendix. Aortic Atherosclerosis (ICD10-I70.0). Lower lumbar listhesis and degenerative changes with associated narrowing of the central canal noted."  -Chest x-ray was negative but CT did show patchy airspace opacites; states that she has a mild cough but is nonproductive -Right foot x-ray: Negative.   -COVID-19 negative, lipase: WNL.  -Patient does not have chronic leukocytosis although she is on prednisone at home.   -Patient has feet pain, worsening kidney function along with anemia-multiple myeloma could have been a  consideration?. -Patient is obese with limited mobility due to feet pain with history of lupus-she is on intermediate risk for PE however could not get CT angiogram due to worsening kidney function and D-dimer will be non specific due to worsening kidney function. -We will get Doppler ultrasound of bilateral lower extremity to rule out DVT and showed no evidence of DVT but was limited   -Started patient on broad-spectrum antibiotics IV Vanco and cefepime but now stopped the IV vancomycin and IV cefepime as she will have had 5 days of antibiotics however will start IV ceftriaxone and de-escalate antibiotics given her continued perinephric stranding and concern for urinary tract infection and pyelonephritis.  Blood  cultures 01/11/2019 and showed NGTD at 5 Days -Has some intermittent nausea and abdominal pain but this is now worsening -Repeat CT scan of the abdomen pelvis as above -We will discuss with Urology about perinephric stranding and hydronephrosis -Continue to Monitor Closely   Bilateral Feet  Pain  -Unknown etiology?,  Had a history of gout in the past. -We will check uric acid, CRP, sed rate; LDH was 238, Uric Acid was 11.5, and ESR was <4; Repeat  -Avoid NSAIDs; May need a Larger Prednisone Taper but will need to address Infection first and she is on Stress Dose Steroids with Hydrocortisone 100 mg IV q8h and now changed to po Prednisone 40 mg by Nephrology and will continue today and transition to 30 mg p.o. tomorrow -Dilaudid as needed for pain control -May need Colchicine  -Repeat Uric Acid Level was 10.3 -Held sedative medications as she was somnolent and difficult to arouse; now awake but she still not alert and oriented and remains significantly confused  AKI on CKD stage IV Metabolic Acidosis, improved -Likely secondary from underlying infection and nephrology feels presumably due to ischemic ATN in setting of sepsis and pyelonephritis with a solitary functioning kidney do not feel that this is consistent with a lupus flare -Patient is BUN/creatinine went from 51/4.42 -> 45/4.09 -> 44/4.20 -> 40/3.39 -> 37/2.92 -> 35/2.70 -> 34/2.83 -> 34/2.84 -> 33/3.03 -> 31/3.00 -Patient's CO2 was 13, anion gap was 14, and chloride levels 111; Now AG is 12, CO2 is 32, and Chloride is 105 -IVF now stopped and she was on IV Sodium Bicarbonate and then D5W;  -Patient has history of left nephrectomy due to renal cell carcinoma. -Potassium was hypokalemic and potassium was 3.0 and magnesium was 1.5  -Renal ultrasound showed "Mild increased renal echogenicity. No hydronephrosis or shadowing stone."  -Repeat CT Scan showed Mild Hydronephrosis as below  -Avoid nephrotoxic medications as well as contrast dyes and patient is currently having her furosemide held [facility] nephrology for further evaluation and management. Appreciated Assistance -Per Nephrology no indication for HD at this time and IV fluids had been discontinued by Nephrology but will be resumed given yesterday's findings  -Dscuss  with Urology about perinephric stranding; will resume IV ceftriaxone for antibiotic coverage and discontinue cefepime given the neurotoxicity associated with lupus -Bladder scan done she was retaining so Foley catheter was inserted however unfortunately Foley catheter came out yesterday morning and will be reinserted as likely the balloon was not fully inflated  -Repeat CMP in AM   Microcytic Anemia/Anemia of Chronic Kidney Disease  -Has a history of warm hemolytic anemia but her T bili is normal and LDH is elevated above normal at 238 and now down to 176 -Patient's Hb/Hct has been stable but did drop slightly to 7.7/34.6 -T Bili today was 0.9 -Initial Anemia Panel and showed an iron level of 14, U IBC of 172, TIBC of 186, saturation ratios of 8%, ferritin level 371 -Repeat anemia panel showed an iron level of 107, U IBC of 62, TIBC 169, saturation ratios of 63%, ferritin of 355 -Nephrology gave the patient darbepoetin alfa 150 mcg subcu  -No signs of active bleeding, monitor H&H closely. -Repeat CMP in AM   Hypomagnesemia -Magnesium is now 1.6 today -Replete with IV Mag Sulfate 1 gram  -Continue to Monitor and Replete as Necessary -Repeat Mag Level in AM   Lupus, currently not in flare -Mycophenolate 500 mg po BID held by Nephrology initially; Will defer to them to Resume and  I spoke to Dr. Posey Pronto of Nephrology and he recommended resuming today at 500 mg BID initially but now recommends holding it due to her Somonlence  -Prednisone 5 mg po Daily held for Stress Dose Steroids but now back on po Prednisone 40 and will taper down to 5 mg in the coming weeks if she is able to tolerate p.o. -C/w Hydroxychloroquine 200 mg po BID -Checking Complement Serologies as well ANA/dsDNA to asses for Lupus Flare and this was negative -Nephrology also checking UP/C but has "Dirty U/A"  Hypokalemia -Potassium this AM was 3.7 -Mag Level ok at 1.6 -Continue to monitor and replete as necessary -Repeat  CMP in AM   Hypertension  -On Amlodipine at home but this was held due to Sepsis and currently still being held -Monitor her blood pressure closely. -Last BP was stable and was 143/84  Depression/Anxiety -Discontinued Trazodone 50 mg po Daily due to somnolence -C/w Duloxetine 60 mg po BID   GERD -C/w Pantoprazole 40 mg pO BID   Chronic Pain Syndrome -On Hydrocodone-Acetaminophen 1 tab po q8hprn Moderate pain, Duloxetine 60 mg po BID, Pregabalin 75 mg po TID usually  -Also have Hydromorphone 1 mg IV q4hprn Severe Pain but currently not getting any medications  -STOPPED Tizanidine 6 mg po BIDprn Muscle Spasms and will also stop Pregabalin 75 mg BID (Renally adjusted here)  Obesity -Estimated body mass index is 33.63 kg/m as calculated from the following:   Height as of this encounter: '5\' 2"'  (1.575 m).   Weight as of this encounter: 83.4 kg. -Weight Loss and Dietary Counseling given   Left Arm Swelling -In the setting of IV infiltration and extravasation of her vancomycin -Continue elevation and warm compresses -If necessary will obtain a Doppler of the left upper extremity  Hypernatremia -Patient's Na+ was 148 and improved to 147 slightly  -Changed to D5W + 20 mEQ of KCl yesterday but is now just on D5W at 100 mL's per hour yesterday and now IF stopped  -Continue to Monitor and Trend -Repeat CMP in AM  Acute Urinary Retention -Foley Catheter in place now as it was not in fully yesterday -U/A repeated and Negative and Urine Cx showed No Growth -Urology following and appreciate further evaluation and Recommendations as she had some Mild Right Hydronephrosis   Somnolence/Drowsiness, persistent and improved slightly  -Patient has been more somnolent and not as responsive the last few days; persistently worsened and sodium is trending up -She will wake up briefly but then closed her eyes and answer some questions but falls back to sleep -We will discontinue all sedative  medications including tizanidine, pregabalin, and trazodone -Check an ABG and showed a pH of 7.486, PCO2 of 43.4, P O2 of 80.7, bicarbonate level 32.4, and an ABG O2 saturation of 96.4% on 21% FiO2 -Ammonia Level was 14 -Checked a stat head CT which showed no acute finding and mild atrophy -Also check an MRI of the brain without contrast given her renal function and shows ". No acute intracranial abnormality. Stable white matter signal changes since 2016, nonspecific but most commonly due to chronic small vessel disease.  Mild to moderate generalized cerebral volume loss since 2006." -We will also check an EEG which is done and was read as "This is a markedly abnormal electroencephalogram secondary to a slow and disorganized background with frequent diffusely distributed triphasic waves.  This finding is consistent with a severe encephalopathy, etiologically nonspecific." -Neurology formally saw the patient today and were repeating an EEG -The  patient remains significantly drowsy and somnolent we will escalate her level of care to a progressive status bed -For now neurology recommends correcting metabolic derangements and treating metabolic acidosis  Confusion and Seizure Like Activity but now with witnessed Tonic-Clonic Seizures x2 overnight -? Post-Ictal State today and she had some form around her mouth -See as above; Ammonia Level was 14 -Neurology was to repeat EEG but likely will do LTM EEG and this has been ordered -They have loaded the patient with IV Fosphenytoin 1500 mg and checking a Phenytoin level and then they are following with 100 g of Dilantin IV 3 times daily; this stated that Keppra is not a good choice given her impaired renal function -Also checking a Dilantin level tomorrow at noon -Appreciate further Neurology evaluation and Recommendations   DVT prophylaxis: Heparin 5,000 units sq q8h Code Status: FULL CODE  Family Communication: No family present at bedside  Disposition  Plan: Pending further improvement back to baseline and evaluation by PT/OT; Refusing SNF and will go home with home health and will continue antibiotics today and stop tomorrow and repeat blood work and imaging in the a.m.  Consultants:   Nephrology  Neurology  Discussed the case with Urology Dr. Jacalyn Lefevre   Procedures: None   Antimicrobials:  Anti-infectives (From admission, onward)   Start     Dose/Rate Route Frequency Ordered Stop   01/16/19 1630  cefTRIAXone (ROCEPHIN) 1 g in sodium chloride 0.9 % 100 mL IVPB     1 g 200 mL/hr over 30 Minutes Intravenous Every 24 hours 01/16/19 1608     01/14/19 0600  vancomycin (VANCOCIN) IVPB 1000 mg/200 mL premix  Status:  Discontinued     1,000 mg 200 mL/hr over 60 Minutes Intravenous Every 48 hours 01/12/19 1041 01/15/19 0816   01/12/19 0445  vancomycin (VANCOCIN) 1,500 mg in sodium chloride 0.9 % 500 mL IVPB     1,500 mg 250 mL/hr over 120 Minutes Intravenous NOW 01/12/19 0426 01/12/19 0750   01/11/19 2200  hydroxychloroquine (PLAQUENIL) tablet 200 mg     200 mg Oral 2 times daily 01/11/19 2105     01/11/19 1830  vancomycin (VANCOCIN) 1,500 mg in sodium chloride 0.9 % 500 mL IVPB  Status:  Discontinued     1,500 mg 250 mL/hr over 120 Minutes Intravenous  Once 01/11/19 1817 01/12/19 0426   01/11/19 1830  ceFEPIme (MAXIPIME) 2 g in sodium chloride 0.9 % 100 mL IVPB  Status:  Discontinued     2 g 200 mL/hr over 30 Minutes Intravenous Every 24 hours 01/11/19 1817 01/16/19 0928   01/11/19 1818  vancomycin variable dose per unstable renal function (pharmacist dosing)  Status:  Discontinued      Does not apply See admin instructions 01/11/19 1818 01/15/19 0816   01/10/19 1845  cefTRIAXone (ROCEPHIN) 1 g in sodium chloride 0.9 % 100 mL IVPB     1 g 200 mL/hr over 30 Minutes Intravenous  Once 01/10/19 1835 01/10/19 2004     Subjective: Seen and examined at bedside she was more awake today and kept saying "Oh Lord" and would answer yes  to her name but would not say anything else.  Was very fidgety and very restless and kept crossing on crossing her legs and is not able to follow commands or give a subjective history.  Objective: Vitals:   01/17/19 2330 01/18/19 0000 01/18/19 0427 01/18/19 0803  BP: (!) 161/91  (!) 143/84   Pulse:   (!) 102 100  Resp:  (!) 21  (!) 21  Temp: 98.2 F (36.8 C)  98 F (36.7 C) 97.6 F (36.4 C)  TempSrc: Axillary  Axillary Axillary  SpO2:  100% 100% 100%  Weight:      Height:        Intake/Output Summary (Last 24 hours) at 01/18/2019 1316 Last data filed at 01/18/2019 0700 Gross per 24 hour  Intake --  Output 1300 ml  Net -1300 ml   Filed Weights   01/15/19 0500 01/16/19 0507 01/17/19 0343  Weight: 87.1 kg 84.5 kg 83.4 kg   Examination: Physical Exam:  Constitutional: WN/WD obese AAF remains confused but is more awake today and is restless and crying out "Oh Lord" and unable to elaborate Eyes: Lids and conjunctivae normal but has Some Proptosis  ENMT: External Ears, Nose appear normal. .  Neck: Appears normal, supple, no cervical masses, normal ROM, no appreciable thyromegaly; no JVD Respiratory: Diminished to auscultation bilaterally, no wheezing, rales, rhonchi or crackles. Mildly increased respiratory effort. No accessory muscle use. Wearing Supplemental O2 today  Cardiovascular: Mildly tachycardic rate but regular rhythm, no murmurs / rubs / gallops. S1 and S2 auscultated. Trace extremity edema Abdomen: Soft, non-tender, Distended due to body habitus. Bowel sounds positive x4.  GU: Deferred but has a Foley in place Musculoskeletal: No clubbing / cyanosis of digits/nails. No joint deformity upper and lower extremities.  Skin: Has lower extremity skin changes and discoloration in the LE from Lupus. No induration; Warm and dry.  Neurologic: Unable to follow commands. Awake and only responds to her name but does not elaborate; Moves Extremities independently   Psychiatric:  Impaired judgment and insight. Awake but not Alert and oriented x 3. Appears anxious and fidgety mood.  Data Reviewed: I have personally reviewed following labs and imaging studies  CBC: Recent Labs  Lab 01/15/19 0234 01/15/19 1815 01/16/19 0227 01/17/19 0941 01/18/19 0304  WBC 17.2* 13.4* 18.4* 15.3* 15.1*  NEUTROABS 13.2* 12.3* 15.3* 12.0* 11.7*  HGB 7.7* 9.1* 8.2* 8.8* 7.7*  HCT 23.8* 28.2* 24.8* 28.7* 24.6*  MCV 69.2* 70.0* 69.1* 73.4* 73.0*  PLT 329 371 318 183 409   Basic Metabolic Panel: Recent Labs  Lab 01/15/19 0234 01/15/19 1815 01/16/19 0227 01/17/19 0941 01/18/19 0304  NA 141   140 142 146* 148* 147*  K 3.0*   3.0* 3.6 3.1* 4.1 3.7  CL 101   99 100 107 107 105  CO2 '29   30 27 30 28 ' 32  GLUCOSE 72   73 105* 91 66* 82  BUN 38*   35* 34* 34* 33* 31*  CREATININE 2.70*   2.74* 2.83* 2.84* 3.03* 3.00*  CALCIUM 7.8*   7.8* 8.6* 8.2* 8.5* 8.3*  MG 1.5* 2.3 2.0 1.8 1.6*  PHOS 2.7   2.6 2.2* 2.1* 2.3* 2.4*   GFR: Estimated Creatinine Clearance: 19.5 mL/min (A) (by C-G formula based on SCr of 3 mg/dL (H)). Liver Function Tests: Recent Labs  Lab 01/15/19 0234 01/15/19 1815 01/16/19 0227 01/17/19 0941 01/18/19 0304  AST '15 18 16 21 21  ' ALT '15 16 15 17 15  ' ALKPHOS 79 93 76 83 75  BILITOT 0.6 0.9 0.7 0.9 1.0  PROT 5.1* 6.4* 5.3* 5.3* 4.9*  ALBUMIN 2.4*   2.3* 2.9* 2.4* 2.5* 2.4*   No results for input(s): LIPASE, AMYLASE in the last 168 hours. Recent Labs  Lab 01/17/19 1146  AMMONIA 14   Coagulation Profile: No results for input(s): INR, PROTIME in the last  168 hours. Cardiac Enzymes: No results for input(s): CKTOTAL, CKMB, CKMBINDEX, TROPONINI in the last 168 hours. BNP (last 3 results) No results for input(s): PROBNP in the last 8760 hours. HbA1C: No results for input(s): HGBA1C in the last 72 hours. CBG: Recent Labs  Lab 01/16/19 0455  GLUCAP 80   Lipid Profile: No results for input(s): CHOL, HDL, LDLCALC, TRIG, CHOLHDL, LDLDIRECT in the  last 72 hours. Thyroid Function Tests: Recent Labs    01/15/19 1815  TSH 3.797   Anemia Panel: No results for input(s): VITAMINB12, FOLATE, FERRITIN, TIBC, IRON, RETICCTPCT in the last 72 hours. Sepsis Labs: Recent Labs  Lab 01/11/19 1802 01/11/19 1807 01/11/19 2004 01/15/19 1815 01/16/19 0227 01/17/19 0941  PROCALCITON 3.53  --   --  0.62 0.65 0.54  LATICACIDVEN  --  0.9 1.1  --   --   --     Recent Results (from the past 240 hour(s))  SARS Coronavirus 2 Ag (30 min TAT) - Nasal Swab (BD Veritor Kit)     Status: None   Collection Time: 01/10/19  3:49 PM   Specimen: Nasal Swab (BD Veritor Kit)  Result Value Ref Range Status   SARS Coronavirus 2 Ag NEGATIVE NEGATIVE Final    Comment: (NOTE) SARS-CoV-2 antigen NOT DETECTED.  Negative results are presumptive.  Negative results do not preclude SARS-CoV-2 infection and should not be used as the sole basis for treatment or other patient management decisions, including infection  control decisions, particularly in the presence of clinical signs and  symptoms consistent with COVID-19, or in those who have been in contact with the virus.  Negative results must be combined with clinical observations, patient history, and epidemiological information. The expected result is Negative. Fact Sheet for Patients: PodPark.tn Fact Sheet for Healthcare Providers: GiftContent.is This test is not yet approved or cleared by the [place] FDA and  has been authorized for detection and/or diagnosis of SARS-CoV-2 by FDA under an Emergency Use Authorization (EUA).  This EUA will remain in effect (meaning this test can be used) for the duration of  the COVID-19 de claration under Section 564(b)(1) of the Act, 21 U.S.C. section 360bbb-3(b)(1), unless the authorization is terminated or revoked sooner. Performed at [facility], [address] [place], [place] [address]     SARS CORONAVIRUS 2 (TAT 6-24 HRS) Nasopharyngeal Urine, Catheterized     Status: None   Collection Time: 01/10/19  6:03 PM   Specimen: Urine, Catheterized; Nasopharyngeal  Result Value Ref Range Status   SARS Coronavirus 2 NEGATIVE NEGATIVE Final    Comment: (NOTE) SARS-CoV-2 target nucleic acids are NOT DETECTED. The SARS-CoV-2 RNA is generally detectable in upper and lower respiratory specimens during the acute phase of infection. Negative results do not preclude SARS-CoV-2 infection, do not rule out co-infections with other pathogens, and should not be used as the sole basis for treatment or other patient management decisions. Negative results must be combined with clinical observations, patient history, and epidemiological information. The expected result is Negative. Fact Sheet for Patients: SugarRoll.be Fact Sheet for Healthcare Providers: https://www.woods-mathews.com/ This test is not yet approved or cleared by the [place] FDA and  has been authorized for detection and/or diagnosis of SARS-CoV-2 by FDA under an Emergency Use Authorization (EUA). This EUA will remain  in effect (meaning this test can be used) for the duration of the COVID-19 declaration under Section 56 4(b)(1) of the Act, 21 U.S.C. section 360bbb-3(b)(1), unless the authorization is terminated or  revoked sooner. Performed at [facility], [place] [address], [place], [place] [address]   Culture, blood (routine x 2)     Status: None   Collection Time: 01/11/19  6:02 PM   Specimen: BLOOD RIGHT HAND  Result Value Ref Range Status   Specimen Description BLOOD RIGHT HAND  Final   Special Requests   Final    BOTTLES DRAWN AEROBIC ONLY Blood Culture results may not be optimal due to an inadequate volume of blood received in culture bottles   Culture   Final    NO GROWTH 5 DAYS Performed at [facility], [place] [address], [place], [place] [address]     Report Status 01/16/2019 FINAL  Final  Culture, blood (routine x 2)     Status: None   Collection Time: 01/11/19  6:07 PM   Specimen: BLOOD LEFT HAND  Result Value Ref Range Status   Specimen Description BLOOD LEFT HAND  Final   Special Requests   Final    BOTTLES DRAWN AEROBIC ONLY Blood Culture results may not be optimal due to an inadequate volume of blood received in culture bottles   Culture   Final    NO GROWTH 5 DAYS Performed at [facility], [place] [address], [place], [place] [address]    Report Status 01/16/2019 FINAL  Final  Urine culture     Status: None   Collection Time: 01/12/19  5:10 PM   Specimen: Urine, Clean Catch  Result Value Ref Range Status   Specimen Description URINE, CLEAN CATCH  Final   Special Requests Immunocompromised  Final   Culture   Final    NO GROWTH Performed at [facility], [place] [address], [place], [place] [address]    Report Status 01/13/2019 FINAL  Final  MRSA PCR Screening     Status: None   Collection Time: 01/12/19 11:05 PM   Specimen: Nasopharyngeal  Result Value Ref Range Status   MRSA by PCR NEGATIVE NEGATIVE Final    Comment:        The GeneXpert MRSA Assay (FDA approved for NASAL specimens only), is one component of a comprehensive MRSA colonization surveillance program. It is not intended to diagnose MRSA infection nor to guide or monitor treatment for MRSA infections. Performed at [facility], [place] [address] [place], [place] [address]   Culture, Urine     Status: None   Collection Time: 01/16/19  5:57 PM   Specimen: Urine, Random  Result Value Ref Range Status   Specimen Description URINE, RANDOM  Final   Special Requests NONE  Final   Culture   Final    NO GROWTH Performed at [facility], [place] [address], [place], [place] [address]    Report Status 01/18/2019 FINAL  Final    Radiology Studies: Ct Head Wo Contrast  Result Date: 01/16/2019 CLINICAL DATA:  Unexplained altered level of  consciousness. EXAM: CT HEAD WITHOUT CONTRAST TECHNIQUE: Contiguous axial images were obtained from the base of the skull through the vertex without intravenous contrast. COMPARISON:  11/05/2015 FINDINGS: Brain: Some motion degradation. Mild atrophy as seen previously. No sign of acute infarction, mass lesion, hemorrhage, hydrocephalus or extra-axial collection. Vascular: There is atherosclerotic calcification of the major vessels at the base of the brain. Skull: Negative Sinuses/Orbits: Clear/normal Other: None IMPRESSION: No acute finding.  Mild atrophy. Electronically Signed   By: Nelson Chimes M.D.   On: 01/16/2019 13:42   Mr Brain  Wo Contrast  Result Date: 01/16/2019 CLINICAL DATA:  63 year old female with unexplained altered mental status. History of renal cell carcinoma status post nephrectomy, and lupus on chronic immunosuppressive therapy. EXAM: MRI HEAD WITHOUT CONTRAST TECHNIQUE: Multiplanar, multiecho pulse sequences of the brain and surrounding structures were obtained without intravenous contrast. COMPARISON:  Head CT earlier today. [facility]. Brain MRI 07/27/2014 FINDINGS: Brain: Generalized cerebral volume loss since 2016, mild-to-moderate. No restricted diffusion to suggest acute infarction. No midline shift, mass effect, evidence of mass lesion, ventriculomegaly, extra-axial collection or acute intracranial hemorrhage. Cervicomedullary junction and pituitary are within normal limits. Scattered mostly subcortical white matter T2 and FLAIR hyperintensity is not significantly changed since 2016. No superimposed cortical encephalomalacia or chronic cerebral blood products. Deep gray matter nuclei, brainstem and cerebellum appear negative. Vascular: Major intracranial vascular flow voids are stable since 2016. Chronic generalized intracranial artery tortuosity. Skull and upper cervical spine: Normal visible cervical spine. Visualized bone marrow signal is  within normal limits. Sinuses/Orbits: Negative orbits. Trace paranasal sinus mucosal thickening is stable. Other: Trace left mastoid effusion Right mastoids has regressed remain clear. Negative visible internal auditory structures. Scalp and face soft tissues appear negative. IMPRESSION: 1. No acute intracranial abnormality. 2. Stable white matter signal changes since 2016, nonspecific but most commonly due to chronic small vessel disease. 3. Mild to moderate generalized cerebral volume loss since 2006. Electronically Signed   By: Genevie Ann M.D.   On: 01/16/2019 14:50   Scheduled Meds:  Chlorhexidine Gluconate Cloth  6 each Topical Daily   darbepoetin (ARANESP) injection - NON-DIALYSIS  150 mcg Subcutaneous Q Fri-1800   DULoxetine  60 mg Oral BID   heparin  5,000 Units Subcutaneous Q8H   hydroxychloroquine  200 mg Oral BID   levothyroxine  25 mcg Oral Q0600   lidocaine  1 patch Transdermal Q24H   pantoprazole  40 mg Oral BID   phenytoin (DILANTIN) IV  100 mg Intravenous Q8H   polyethylene glycol  17 g Oral BID   predniSONE  30 mg Oral Q breakfast   senna-docusate  1 tablet Oral BID   Continuous Infusions:  cefTRIAXone (ROCEPHIN)  IV 1 g (01/17/19 1659)   fosPHENYtoin (CEREBYX) IV     potassium PHOSPHATE IVPB (in mmol) 10 mmol (01/18/19 1216)    LOS: 7 days   Kerney Elbe, DO [facility] PAGER is on AMION  If 7PM-7AM, please contact night-coverage [email or web address]

## 2019-01-18 NOTE — Progress Notes (Signed)
Subjective: The patient had 2 GTC seizures overnight witnessed by staff. Each seizure lasted for approximately 30 seconds.   Objective: Current vital signs: BP (!) 143/84 (BP Location: Left Arm)   Pulse 100   Temp 97.6 F (36.4 C) (Axillary)   Resp (!) 21   Ht _0  (1.575 m)   Wt 83.4 kg   SpO2 100%   BMI 33.63 kg/m  Vital signs in last 24 hours: Temp:  [97.6 F (36.4 C)-98.2 F (36.8 C)] 97.6 F (36.4 C) (12/08 0803) Pulse Rate:  [97-102] 100 (12/08 0803) Resp:  [20-21] 21 (12/08 0803) BP: (133-161)/(77-91) 143/84 (12/08 0427) SpO2:  [96 %-100 %] 100 % (12/08 0803)  Intake/Output from previous day: 12/07 0701 - 12/08 0700 In: -  Out: 1300 [Urine:1300] Intake/Output this shift: No intake/output data recorded. Nutritional status:  Diet Order            Diet renal with fluid restriction Fluid restriction: 1200 mL Fluid; Room service appropriate? Yes; Fluid consistency: Thin  Diet effective now              Neurologic Exam: Mental Status: Patient is awake again today with eyes open. She will attend to noxious stimuli but did turn her head towards her name. She did exclaim a two word phrase intelligibly "Oh Lord" to one noxious stimulus. Somewhat less agitated than yesterday. Intermittent yawning not present on today's exam - this was a prominent feature yesterday. the exam. Continues to not follow commands or answer questions. Patient continues to withdraw from pain vigorously in all extremities. Overall slightly improved since yesterday.   Cranial Nerves: II: PERRL.  III,IV, VI: Will gaze to left and right spontaneously. No forced gaze deviation noted.  VII: Facial movement is symmetric.  X: Unable to visualize palate XI: Rolls head from side to side when agitated, without asymmetry Motor: Moves all extremities vigorously to noxious as well as spontaneously due to agitation. No asymmetry noted.  Sensory: Sensation is symmetric to noxious stimuli  Lab  Results: Results for orders placed or performed during the hospital encounter of 01/10/19 (from the past 48 hour(s))  Urinalysis, Routine w reflex microscopic     Status: None   Collection Time: 01/16/19  5:57 PM  Result Value Ref Range   Color, Urine YELLOW YELLOW   APPearance CLEAR CLEAR   Specific Gravity, Urine 1.010 1.005 - 1.030   pH 7.0 5.0 - 8.0   Glucose, UA NEGATIVE NEGATIVE mg/dL   Hgb urine dipstick NEGATIVE NEGATIVE   Bilirubin Urine NEGATIVE NEGATIVE   Ketones, ur NEGATIVE NEGATIVE mg/dL   Protein, ur NEGATIVE NEGATIVE mg/dL   Nitrite NEGATIVE NEGATIVE   Leukocytes,Ua NEGATIVE NEGATIVE    Comment: Performed at [facility] [address], [place], [place] [address]  Culture, Urine     Status: None   Collection Time: 01/16/19  5:57 PM   Specimen: Urine, Random  Result Value Ref Range   Specimen Description URINE, RANDOM    Special Requests NONE    Culture      NO GROWTH Performed at [facility], [place] [address], [place], [place] [address]    Report Status 01/18/2019 FINAL   Procalcitonin     Status: None   Collection Time: 01/17/19  9:41 AM  Result Value Ref Range   Procalcitonin 0.54 ng/mL    Comment:        Interpretation: PCT > 0.5 ng/mL and <= 2 ng/mL: Systemic infection (sepsis) is possible,  but other conditions are known to elevate PCT as well. (NOTE)       Sepsis PCT Algorithm           Lower Respiratory Tract                                      Infection PCT Algorithm    ----------------------------     ----------------------------         PCT < 0.25 ng/mL                PCT < 0.10 ng/mL         Strongly encourage             Strongly discourage   discontinuation of antibiotics    initiation of antibiotics    ----------------------------     -----------------------------       PCT 0.25 - 0.50 ng/mL            PCT 0.10 - 0.25 ng/mL               OR       >80% decrease in PCT            Discourage initiation of                                             antibiotics      Encourage discontinuation           of antibiotics    ----------------------------     -----------------------------         PCT >= 0.50 ng/mL              PCT 0.26 - 0.50 ng/mL                AND       <80% decrease in PCT             Encourage initiation of                                             antibiotics       Encourage continuation           of antibiotics    ----------------------------     -----------------------------        PCT >= 0.50 ng/mL                  PCT > 0.50 ng/mL               AND         increase in PCT                  Strongly encourage                                      initiation of antibiotics    Strongly encourage escalation           of antibiotics                                     -----------------------------  PCT <= 0.25 ng/mL                                                 OR                                        > 80% decrease in PCT                                     Discontinue / Do not initiate                                             antibiotics Performed at [facility], [place] [address], [place], [place] [address]   Magnesium     Status: None   Collection Time: 01/17/19  9:41 AM  Result Value Ref Range   Magnesium 1.8 1.7 - 2.4 mg/dL    Comment: Performed at [facility] [address] [place], [place] [address]  Phosphorus     Status: Abnormal   Collection Time: 01/17/19  9:41 AM  Result Value Ref Range   Phosphorus 2.3 (L) 2.5 - 4.6 mg/dL    Comment: Performed at [facility] [address] [place], [place] [address]  CBC with Differential/Platelet     Status: Abnormal   Collection Time: 01/17/19  9:41 AM  Result Value Ref Range   WBC 15.3 (H) 4.0 - 10.5 K/uL   RBC 3.91 3.87 - 5.11 MIL/uL   Hemoglobin 8.8 (L) 12.0 - 15.0 g/dL    Comment: Reticulocyte Hemoglobin testing may be clinically indicated, consider ordering this  additional test [id number]    HCT 28.7 (L) 36.0 - 46.0 %   MCV 73.4 (L) 80.0 - 100.0 fL   MCH 22.5 (L) 26.0 - 34.0 pg   MCHC 30.7 30.0 - 36.0 g/dL   RDW 15.6 (H) 11.5 - 15.5 %   Platelets 183 150 - 400 K/uL   nRBC 2.0 (H) 0.0 - 0.2 %   Neutrophils Relative % 78 %   Neutro Abs 12.0 (H) 1.7 - 7.7 K/uL   Lymphocytes Relative 11 %   Lymphs Abs 1.7 0.7 - 4.0 K/uL   Monocytes Relative 7 %   Monocytes Absolute 1.0 0.1 - 1.0 K/uL   Eosinophils Relative 2 %   Eosinophils Absolute 0.3 0.0 - 0.5 K/uL   Basophils Relative 0 %   Basophils Absolute 0.1 0.0 - 0.1 K/uL   Immature Granulocytes 2 %   Abs Immature Granulocytes 0.29 (H) 0.00 - 0.07 K/uL    Comment: Performed at [facility] [address] [place], [place] [address]  Comprehensive metabolic panel     Status: Abnormal   Collection Time: 01/17/19  9:41 AM  Result Value Ref Range   Sodium 148 (H) 135 - 145 mmol/L   Potassium 4.1 3.5 - 5.1 mmol/L   Chloride 107 98 - 111 mmol/L   CO2 28 22 - 32 mmol/L   Glucose, Bld 66 (L) 70 - 99 mg/dL   BUN 33 (  H) 8 - 23 mg/dL   Creatinine, Ser 3.03 (H) 0.44 - 1.00 mg/dL   Calcium 8.5 (L) 8.9 - 10.3 mg/dL   Total Protein 5.3 (L) 6.5 - 8.1 g/dL   Albumin 2.5 (L) 3.5 - 5.0 g/dL   AST 21 15 - 41 U/L   ALT 17 0 - 44 U/L   Alkaline Phosphatase 83 38 - 126 U/L   Total Bilirubin 0.9 0.3 - 1.2 mg/dL   GFR calc non Af Amer 16 (L) >60 mL/min   GFR calc Af Amer 18 (L) >60 mL/min   Anion gap 13 5 - 15    Comment: Performed at [facility] [address], [place], [place] [address]  Ammonia     Status: None   Collection Time: 01/17/19 11:46 AM  Result Value Ref Range   Ammonia 14 9 - 35 umol/L    Comment: Performed at [facility], [place] [address], [place], [place] [address]  Magnesium     Status: Abnormal   Collection Time: 01/18/19  3:04 AM  Result Value Ref Range   Magnesium 1.6 (L) 1.7 - 2.4 mg/dL    Comment: Performed at [facility] [address] [place], [place]  [address]  Phosphorus     Status: Abnormal   Collection Time: 01/18/19  3:04 AM  Result Value Ref Range   Phosphorus 2.4 (L) 2.5 - 4.6 mg/dL    Comment: Performed at [facility] [address] [place], [place] [address]  CBC with Differential/Platelet     Status: Abnormal   Collection Time: 01/18/19  3:04 AM  Result Value Ref Range   WBC 15.1 (H) 4.0 - 10.5 K/uL    Comment: WHITE COUNT CONFIRMED ON SMEAR   RBC 3.37 (L) 3.87 - 5.11 MIL/uL   Hemoglobin 7.7 (L) 12.0 - 15.0 g/dL    Comment: Reticulocyte Hemoglobin testing may be clinically indicated, consider ordering this additional test [id number]    HCT 24.6 (L) 36.0 - 46.0 %   MCV 73.0 (L) 80.0 - 100.0 fL   MCH 22.8 (L) 26.0 - 34.0 pg   MCHC 31.3 30.0 - 36.0 g/dL   RDW 15.2 11.5 - 15.5 %   Platelets 249 150 - 400 K/uL    Comment: REPEATED TO VERIFY   nRBC 5.4 (H) 0.0 - 0.2 %   Neutrophils Relative % 77 %   Neutro Abs 11.7 (H) 1.7 - 7.7 K/uL   Lymphocytes Relative 10 %   Lymphs Abs 1.5 0.7 - 4.0 K/uL   Monocytes Relative 8 %   Monocytes Absolute 1.1 (H) 0.1 - 1.0 K/uL   Eosinophils Relative 2 %   Eosinophils Absolute 0.3 0.0 - 0.5 K/uL   Basophils Relative 0 %   Basophils Absolute 0.1 0.0 - 0.1 K/uL   Immature Granulocytes 3 %   Abs Immature Granulocytes 0.45 (H) 0.00 - 0.07 K/uL   Schistocytes PRESENT    Target Cells PRESENT     Comment: Performed at [facility], [place] [address] [place], [place] [address]  Comprehensive metabolic panel     Status: Abnormal   Collection Time: 01/18/19  3:04 AM  Result Value Ref Range   Sodium 147 (H) 135 - 145 mmol/L   Potassium 3.7 3.5 - 5.1 mmol/L   Chloride 105 98 - 111 mmol/L   CO2 32 22 - 32 mmol/L   Glucose, Bld 82 70 - 99 mg/dL   BUN 31 (H)  8 - 23 mg/dL   Creatinine, Ser 3.00 (H) 0.44 - 1.00 mg/dL   Calcium 8.3 (L) 8.9 - 10.3 mg/dL   Total Protein 4.9 (L) 6.5 - 8.1 g/dL   Albumin 2.4 (L) 3.5 - 5.0 g/dL   AST 21 15 - 41 U/L   ALT 15 0 - 44 U/L   Alkaline  Phosphatase 75 38 - 126 U/L   Total Bilirubin 1.0 0.3 - 1.2 mg/dL   GFR calc non Af Amer 16 (L) >60 mL/min   GFR calc Af Amer 19 (L) >60 mL/min   Anion gap 10 5 - 15    Comment: Performed at [facility] [address] [place], [place] [address]  Pathologist smear review     Status: None   Collection Time: 01/18/19  3:04 AM  Result Value Ref Range   Path Review      Microcytic anemia with circulating nrbc and poikilocytosis    Comment: Leukocytosis with hypolobate and hypogranular neutrophils Reviewed by Arrie Aran L. Melina Copa, M.D. 01/18/2019 Performed at [facility], [place] [address] [place], [place] [address]     Recent Results (from the past 240 hour(s))  SARS Coronavirus 2 Ag (30 min TAT) - Nasal Swab (BD Veritor Kit)     Status: None   Collection Time: 01/10/19  3:49 PM   Specimen: Nasal Swab (BD Veritor Kit)  Result Value Ref Range Status   SARS Coronavirus 2 Ag NEGATIVE NEGATIVE Final    Comment: (NOTE) SARS-CoV-2 antigen NOT DETECTED.  Negative results are presumptive.  Negative results do not preclude SARS-CoV-2 infection and should not be used as the sole basis for treatment or other patient management decisions, including infection  control decisions, particularly in the presence of clinical signs and  symptoms consistent with COVID-19, or in those who have been in contact with the virus.  Negative results must be combined with clinical observations, patient history, and epidemiological information. The expected result is Negative. Fact Sheet for Patients: PodPark.tn Fact Sheet for Healthcare Providers: GiftContent.is This test is not yet approved or cleared by the [place] FDA and  has been authorized for detection and/or diagnosis of SARS-CoV-2 by FDA under an Emergency Use Authorization (EUA).  This EUA will remain in effect (meaning this test can be used) for the duration of  the COVID-19  de claration under Section 564(b)(1) of the Act, 21 U.S.C. section 360bbb-3(b)(1), unless the authorization is terminated or revoked sooner. Performed at [facility], [address] [place], [place] [address]   SARS CORONAVIRUS 2 (TAT 6-24 HRS) Nasopharyngeal Urine, Catheterized     Status: None   Collection Time: 01/10/19  6:03 PM   Specimen: Urine, Catheterized; Nasopharyngeal  Result Value Ref Range Status   SARS Coronavirus 2 NEGATIVE NEGATIVE Final    Comment: (NOTE) SARS-CoV-2 target nucleic acids are NOT DETECTED. The SARS-CoV-2 RNA is generally detectable in upper and lower respiratory specimens during the acute phase of infection. Negative results do not preclude SARS-CoV-2 infection, do not rule out co-infections with other pathogens, and should not be used as the sole basis for treatment or other patient management decisions. Negative results must be combined with clinical observations, patient history, and epidemiological information. The expected result is Negative. Fact Sheet for Patients: SugarRoll.be Fact Sheet for Healthcare Providers: https://www.woods-mathews.com/ This test is not yet approved or cleared by the [place] FDA and  has been authorized for detection and/or diagnosis of SARS-CoV-2 by FDA under  an Emergency Use Authorization (EUA). This EUA will remain  in effect (meaning this test can be used) for the duration of the COVID-19 declaration under Section 56 4(b)(1) of the Act, 21 U.S.C. section 360bbb-3(b)(1), unless the authorization is terminated or revoked sooner. Performed at [facility], [place] [address], [place], [place] [address]   Culture, blood (routine x 2)     Status: None   Collection Time: 01/11/19  6:02 PM   Specimen: BLOOD RIGHT HAND  Result Value Ref Range Status   Specimen Description BLOOD RIGHT HAND  Final   Special Requests   Final    BOTTLES DRAWN AEROBIC ONLY  Blood Culture results may not be optimal due to an inadequate volume of blood received in culture bottles   Culture   Final    NO GROWTH 5 DAYS Performed at [facility], [place] [address], [place], [place] [address]    Report Status 01/16/2019 FINAL  Final  Culture, blood (routine x 2)     Status: None   Collection Time: 01/11/19  6:07 PM   Specimen: BLOOD LEFT HAND  Result Value Ref Range Status   Specimen Description BLOOD LEFT HAND  Final   Special Requests   Final    BOTTLES DRAWN AEROBIC ONLY Blood Culture results may not be optimal due to an inadequate volume of blood received in culture bottles   Culture   Final    NO GROWTH 5 DAYS Performed at [facility], [place] [address], [place], [place] [address]    Report Status 01/16/2019 FINAL  Final  Urine culture     Status: None   Collection Time: 01/12/19  5:10 PM   Specimen: Urine, Clean Catch  Result Value Ref Range Status   Specimen Description URINE, CLEAN CATCH  Final   Special Requests Immunocompromised  Final   Culture   Final    NO GROWTH Performed at [facility], [place] [address], [place], [place] [address]    Report Status 01/13/2019 FINAL  Final  MRSA PCR Screening     Status: None   Collection Time: 01/12/19 11:05 PM   Specimen: Nasopharyngeal  Result Value Ref Range Status   MRSA by PCR NEGATIVE NEGATIVE Final    Comment:        The GeneXpert MRSA Assay (FDA approved for NASAL specimens only), is one component of a comprehensive MRSA colonization surveillance program. It is not intended to diagnose MRSA infection nor to guide or monitor treatment for MRSA infections. Performed at [facility], [place] [address] [place], [place] [address]   Culture, Urine     Status: None   Collection Time: 01/16/19  5:57 PM   Specimen: Urine, Random  Result Value Ref Range Status   Specimen Description URINE, RANDOM  Final   Special Requests NONE  Final   Culture   Final    NO GROWTH Performed at  [facility], [place] [address] [place],  [address]    Report Status 01/18/2019 FINAL  Final    Lipid Panel No results for input(s): CHOL, TRIG, HDL, CHOLHDL, VLDL, LDLCALC in the last 72 hours.  Studies/Results: Ct Head Wo Contrast  Result Date: 01/16/2019 CLINICAL DATA:  Unexplained altered level of consciousness. EXAM: CT HEAD WITHOUT CONTRAST TECHNIQUE: Contiguous axial images were obtained from the base of the skull through the vertex without intravenous contrast. COMPARISON:  11/05/2015 FINDINGS: Brain: Some motion degradation. Mild atrophy as seen  previously. No sign of acute infarction, mass lesion, hemorrhage, hydrocephalus or extra-axial collection. Vascular: There is atherosclerotic calcification of the major vessels at the base of the brain. Skull: Negative Sinuses/Orbits: Clear/normal Other: None IMPRESSION: No acute finding.  Mild atrophy. Electronically Signed   By: Nelson Chimes M.D.   On: 01/16/2019 13:42   Mr Brain Wo Contrast  Result Date: 01/16/2019 CLINICAL DATA:  63 year old female with unexplained altered mental status. History of renal cell carcinoma status post nephrectomy, and lupus on chronic immunosuppressive therapy. EXAM: MRI HEAD WITHOUT CONTRAST TECHNIQUE: Multiplanar, multiecho pulse sequences of the brain and surrounding structures were obtained without intravenous contrast. COMPARISON:  Head CT earlier today. [facility]. Brain MRI 07/27/2014 FINDINGS: Brain: Generalized cerebral volume loss since 2016, mild-to-moderate. No restricted diffusion to suggest acute infarction. No midline shift, mass effect, evidence of mass lesion, ventriculomegaly, extra-axial collection or acute intracranial hemorrhage. Cervicomedullary junction and pituitary are within normal limits. Scattered mostly subcortical white matter T2 and FLAIR hyperintensity is not significantly changed since 2016. No superimposed cortical encephalomalacia  or chronic cerebral blood products. Deep gray matter nuclei, brainstem and cerebellum appear negative. Vascular: Major intracranial vascular flow voids are stable since 2016. Chronic generalized intracranial artery tortuosity. Skull and upper cervical spine: Normal visible cervical spine. Visualized bone marrow signal is within normal limits. Sinuses/Orbits: Negative orbits. Trace paranasal sinus mucosal thickening is stable. Other: Trace left mastoid effusion Right mastoids has regressed remain clear. Negative visible internal auditory structures. Scalp and face soft tissues appear negative. IMPRESSION: 1. No acute intracranial abnormality. 2. Stable white matter signal changes since 2016, nonspecific but most commonly due to chronic small vessel disease. 3. Mild to moderate generalized cerebral volume loss since 2006. Electronically Signed   By: Genevie Ann M.D.   On: 01/16/2019 14:50    Medications:  Scheduled: . Chlorhexidine Gluconate Cloth  6 each Topical Daily  . darbepoetin (ARANESP) injection - NON-DIALYSIS  150 mcg Subcutaneous Q Fri-1800  . DULoxetine  60 mg Oral BID  . heparin  5,000 Units Subcutaneous Q8H  . hydroxychloroquine  200 mg Oral BID  . levothyroxine  25 mcg Oral Q0600  . lidocaine  1 patch Transdermal Q24H  . pantoprazole  40 mg Oral BID  . polyethylene glycol  17 g Oral BID  . predniSONE  30 mg Oral Q breakfast  . senna-docusate  1 tablet Oral BID   Continuous: . cefTRIAXone (ROCEPHIN)  IV 1 g (01/17/19 1659)  . potassium PHOSPHATE IVPB (in mmol) 10 mmol (01/18/19 1216)    Assessment: 64 year old female with a complicated past medical history, admitted for management of worsening renal function as well as concern for sepsis. Neurology consulted for progressive worsening of mental status. On arrival she was able to converse, currently in a delirious to somnolent state. She had new onset GTC seizures x 2 overnight.  1. Although EEG showed severe encephalopathy with no  seizures, given the new onset GTC seizures witnessed overnight, the most likely etiology for her AMS is subclinical status versus intermittent subclinical seizures with postictal state.   2. Of note, patient's MRI did not show any abnormalities.    Recommendations: -Continue to correct metabolic derangements -Continue to treat metabolic acidosis -Start LTM EEG (ordered) --Load STAT with fosphenytoin 20 mg/kg PE, followed by 100 mg Dilantin IV TID. Keppra not a good choice given her impaired renal function with eGFR of 19. --Dilantin level tomorrow at noon   LOS: 7 days   @  Electronically signed: Dr. Kerney Elbe 01/18/2019  12:59 PM

## 2019-01-18 NOTE — Progress Notes (Signed)
LTM EEG hooked up and running - no initial skin breakdown - push button tested - neuro notified.  

## 2019-01-18 NOTE — Progress Notes (Signed)
PT Cancellation Note  Patient Details Name: Carolyn Zamora MRN: [id number] DOB: [birth year]   Cancelled Treatment:    Reason Eval/Treat Not Completed: Patient not medically ready. RN asked PT to hold today as she isn't following any commands and she is going for another test to assess for seizures. Pt very restless in bed and unable to engage with RN, report needs, or follow commands. Acute PT to return as able, as appropriate to assess mobility.  Kittie Plater, PT, DPT Acute Rehabilitation Services Pager #: [phone] Office #: [phone]    Berline Lopes 01/18/2019, 10:14 AM

## 2019-01-18 NOTE — Progress Notes (Signed)
Pt's daughter updated on patient's condition and plan of care. Requesting MD to call her in am.

## 2019-01-19 DIAGNOSIS — R4182 Altered mental status, unspecified: Secondary | ICD-10-CM

## 2019-01-19 LAB — COMPREHENSIVE METABOLIC PANEL WITH GFR
ALT: 22 U/L (ref 0–44)
AST: 36 U/L (ref 15–41)
Albumin: 2.5 g/dL — ABNORMAL LOW (ref 3.5–5.0)
Alkaline Phosphatase: 75 U/L (ref 38–126)
Anion gap: 13 (ref 5–15)
BUN: 24 mg/dL — ABNORMAL HIGH (ref 8–23)
CO2: 30 mmol/L (ref 22–32)
Calcium: 8.3 mg/dL — ABNORMAL LOW (ref 8.9–10.3)
Chloride: 102 mmol/L (ref 98–111)
Creatinine, Ser: 3.01 mg/dL — ABNORMAL HIGH (ref 0.44–1.00)
GFR calc Af Amer: 18 mL/min — ABNORMAL LOW
GFR calc non Af Amer: 16 mL/min — ABNORMAL LOW
Glucose, Bld: 68 mg/dL — ABNORMAL LOW (ref 70–99)
Potassium: 3.4 mmol/L — ABNORMAL LOW (ref 3.5–5.1)
Sodium: 145 mmol/L (ref 135–145)
Total Bilirubin: 0.9 mg/dL (ref 0.3–1.2)
Total Protein: 4.8 g/dL — ABNORMAL LOW (ref 6.5–8.1)

## 2019-01-19 LAB — PHOSPHORUS: Phosphorus: 3.1 mg/dL (ref 2.5–4.6)

## 2019-01-19 LAB — PHENYTOIN LEVEL, TOTAL: Phenytoin Lvl: 9.5 ug/mL — ABNORMAL LOW (ref 10.0–20.0)

## 2019-01-19 LAB — CBC WITH DIFFERENTIAL/PLATELET
Abs Immature Granulocytes: 0.36 K/uL — ABNORMAL HIGH (ref 0.00–0.07)
Basophils Absolute: 0 K/uL (ref 0.0–0.1)
Basophils Relative: 0 %
Eosinophils Absolute: 0.3 K/uL (ref 0.0–0.5)
Eosinophils Relative: 3 %
HCT: 23.7 % — ABNORMAL LOW (ref 36.0–46.0)
Hemoglobin: 7.4 g/dL — ABNORMAL LOW (ref 12.0–15.0)
Immature Granulocytes: 3 %
Lymphocytes Relative: 20 %
Lymphs Abs: 2.2 K/uL (ref 0.7–4.0)
MCH: 22.5 pg — ABNORMAL LOW (ref 26.0–34.0)
MCHC: 31.2 g/dL (ref 30.0–36.0)
MCV: 72 fL — ABNORMAL LOW (ref 80.0–100.0)
Monocytes Absolute: 0.9 K/uL (ref 0.1–1.0)
Monocytes Relative: 9 %
Neutro Abs: 7 K/uL (ref 1.7–7.7)
Neutrophils Relative %: 65 %
Platelets: 242 K/uL (ref 150–400)
RBC: 3.29 MIL/uL — ABNORMAL LOW (ref 3.87–5.11)
RDW: 15.4 % (ref 11.5–15.5)
WBC: 10.8 K/uL — ABNORMAL HIGH (ref 4.0–10.5)
nRBC: 11.2 % — ABNORMAL HIGH (ref 0.0–0.2)

## 2019-01-19 LAB — MAGNESIUM: Magnesium: 1.6 mg/dL — ABNORMAL LOW (ref 1.7–2.4)

## 2019-01-19 MED ORDER — AMLODIPINE BESYLATE 10 MG PO TABS
10.0000 mg | ORAL_TABLET | Freq: Every day | ORAL | Status: DC
  Administered 2019-01-19 – 2019-01-24 (×6): 10 mg via ORAL
  Filled 2019-01-19 (×6): qty 1

## 2019-01-19 MED ORDER — HALOPERIDOL LACTATE 5 MG/ML IJ SOLN
2.0000 mg | Freq: Once | INTRAMUSCULAR | Status: AC
  Administered 2019-01-19: 2 mg via INTRAVENOUS
  Filled 2019-01-19: qty 1

## 2019-01-19 MED ORDER — MAGNESIUM OXIDE 400 (241.3 MG) MG PO TABS
400.0000 mg | ORAL_TABLET | Freq: Two times a day (BID) | ORAL | Status: DC
  Administered 2019-01-19 – 2019-01-24 (×11): 400 mg via ORAL
  Filled 2019-01-19 (×11): qty 1

## 2019-01-19 MED ORDER — PANTOPRAZOLE SODIUM 40 MG PO TBEC
40.0000 mg | DELAYED_RELEASE_TABLET | Freq: Every day | ORAL | Status: DC
  Administered 2019-01-19 – 2019-01-24 (×6): 40 mg via ORAL
  Filled 2019-01-19 (×6): qty 1

## 2019-01-19 MED ORDER — HYDROMORPHONE HCL 1 MG/ML IJ SOLN: 0.5000 mg | INTRAMUSCULAR | Status: DC | PRN

## 2019-01-19 MED ORDER — POTASSIUM CHLORIDE CRYS ER 20 MEQ PO TBCR
40.0000 meq | EXTENDED_RELEASE_TABLET | Freq: Once | ORAL | Status: AC
  Administered 2019-01-19: 40 meq via ORAL
  Filled 2019-01-19: qty 2

## 2019-01-19 NOTE — Progress Notes (Signed)
EEG maintenance complete. No skin breakdown at Fp1 Fp2

## 2019-01-19 NOTE — Progress Notes (Signed)
PT Cancellation Note  Patient Details Name: Carolyn Zamora MRN: [id number] DOB: December 19, 1955   Cancelled Treatment:    Reason Eval/Treat Not Completed: Patient not medically ready. Pt remains to have seizure like activity, Rapid response was called earlier. RN reports pt is still not following commands and is very restless. Acute PT to return as able, as appropriate to progress pt mobility.  Kittie Plater, PT, DPT Acute Rehabilitation Services Pager #: [phone] Office #: [phone]    Berline Lopes 01/19/2019, 8:20 AM

## 2019-01-19 NOTE — Progress Notes (Signed)
Rapid and MD paged for red MEWS. Pts HR triggered red MEWS due to it being elevated into the 120s. Pt is active and constantly moving in bed, resulting in spikes in HR. Rapid Loma Sousa) notified. Pt now resting at 93 bpm.  Awaiting MDs response.

## 2019-01-19 NOTE — Progress Notes (Signed)
Called to update Family, spoke with sister Carolyn Zamora and she was able to speak with Mrs. Carolyn Zamora. Mrs.Carolyn Zamora was able to recognize that she was talking to her sister and called her by nick name.

## 2019-01-19 NOTE — Progress Notes (Addendum)
PROGRESS NOTE  Carolyn Zamora VKP:[id number] DOB: [birth year] DOA: 01/10/2019 PCP: Beckie Salts, MD   LOS: 8 days   Brief narrative:  Carolyn Zamora a 63 y.o.femalewith medical history significant ofrenal cell carcinoma status post left nephrectomy, history of splenectomy, CKD stage III, chronic pain syndrome, morbid obesity, lupus-on chronic immunosuppressive therapy, hypertension, anemia of chronic disease was transferred from [facility] for the concern of sepsis secondary to UTI and worsening kidney function. She also complained of the severe bilateral lower extremity pain with difficulty ambulation decreased appetite fatigue and lethargy.  X-ray of right foot obtained at [facility]-which came back negative.She lives alone at home and her daughter and sister helps her.   At [facility]: Patient was tachycardic, tachypneic, leukocytosis of 20.3, UA positive for small leukocytes. CK, lipase level: WNL, COVID-19 negative, magnesium: Low--was replaced. CMP showed worsening kidney function. Patient received IV fluids and Rocephin for the concern of urosepsis. Patient was then transferred to [facility] for further evaluation and management.  Assessment/Plan:  Principal Problem:   Sepsis ([place]) Active Problems:   Hypertension   Systemic lupus erythematosus (HCC)   Chronic pain syndrome   Renal cell carcinoma of left kidney s/p nephrectomy   Anemia of chronic disease   Renal failure (ARF), acute on chronic (HCC)   Pain in both feet   Hypomagnesemia  Sepsisof Unknown Etiology but likely in the setting of Pyelonephritis  -Abnormal urinalysis, urine culture negative so far. MRSA PCR was negative. Off vancomycin.  Leukocytosis present but trending down.  CT scan abdomen on 01/15/2019 reviewed which showed a small left pleural effusion mild right hydronephrosis and perinephric stranding.COVID-19 negative, lipase: WNL. Patient does not have chronic leukocytosis  although she is on prednisone at home.  Blood cultures 01/11/2019 and showed NGTD at 5 Days.   Bilateral Feet Pain  Duplex ultrasound was negative for DVT. ,History of gout  LDH was 238, Uric Acid was 11.5, and ESR was <4;  -Avoid NSAIDs; on prednisone 30 mg now.  Gradually taper down.  On Norco and duloxetine.  Repeat Uric Acid Level was 10.3  Mild hypomagnesemia.  Will closely monitor.  Received replacement yesterday.  Check levels in a.m. add oral magnesium.   Mild hypokalemia.  Replenished orally.  Check BMP in a.m.  AKI on CKD stage IV Metabolic Acidosis, improved Likely secondary to sepsis. was on IV Sodium Bicarbonate and then D5W;  creatinine today of 3.0 and has remained stable. Patient has history of left nephrectomy due to renal cell carcinoma. Repeat CT Scan showed mild Hydronephrosis.  Continue to hold Lasix.  Nephrology on board.  Urology was consulted about perinephric stranding and hydronephrosis..   Microcytic Anemia/Anemia of Chronic Kidney Disease  Received darbepoetin alfa 150 mcg subcu ,No signs of active bleeding, monitor H&H closely.  Lupus, currently not in flare -Mycophenolate hold due to somnolence, now improved.  Prednisone taper down to 5 mg in the coming weeks if she is able to tolerate p.o. Continue hydroxychloroquine 200 mg po BID  Hypertension  -On Amlodipine at home but this was held due to Sepsis .  Blood pressure is starting to trend up so we will restart amlodipine today.  Depression/Anxiety Continue duloxetine.  Continue to hold trazodone due to somnolence  GERD On Pantoprazole   Chronic Pain Syndrome Continue Norco, duloxetine.    Off tizanidine and Lyrica.    Obesity Lifestyle modification advised  Left Arm Swelling From IV infiltration.  Supportive care  Hypernatremia Improved with D5 water.  Sodium level of 145 today.  Acute Urinary Retention -Seen by [facility], urology on board for mild hydronephrosis.  On a Foley  catheter.  Metabolic encephalopathy  -Ammonia Level was 14.  CT head scan was negative for acute findings.  EEG showed severe encephalopathy, nonspecific finding. Neurology on board and recommend treating metabolic issues.  Long-term EEG is under process  Confusion and Seizure Like Activity with witnessed Tonic-Clonic Seizures x2  Neurology on board and LT EEG is under process..  Patient was loaded with IV Fosphenytoin 1500 mg and continue on Dilantin.  Keppra is not a good choice given her impaired renal function  Follow neurology recommendations.  Phenytoin level of 9.5 borderline low.  VTE Prophylaxis: Heparin subcu  Code Status: Full code  Family Communication: Spoke with the patient's sister Ms Cannon Kettle and updated her about the clinical condition.  Answered all the queries.  Disposition Plan: Seen by PT recommend home health on discharge.  Follow neurology, urology recommendations.  Will need to address Foley catheter.  Addendum:  01/19/2019 3:51 PM   Placed order for home health PT OT and RN.  Consultants:  Neurology,   nephrology,   [facility]  Procedures:  Foley catheter  Antibiotics: Anti-infectives (From admission, onward)   Start     Dose/Rate Route Frequency Ordered Stop   01/16/19 1630  cefTRIAXone (ROCEPHIN) 1 g in sodium chloride 0.9 % 100 mL IVPB     1 g 200 mL/hr over 30 Minutes Intravenous Every 24 hours 01/16/19 1608     01/14/19 0600  vancomycin (VANCOCIN) IVPB 1000 mg/200 mL premix  Status:  Discontinued     1,000 mg 200 mL/hr over 60 Minutes Intravenous Every 48 hours 01/12/19 1041 01/15/19 0816   01/12/19 0445  vancomycin (VANCOCIN) 1,500 mg in sodium chloride 0.9 % 500 mL IVPB     1,500 mg 250 mL/hr over 120 Minutes Intravenous NOW 01/12/19 0426 01/12/19 0750   01/11/19 2200  hydroxychloroquine (PLAQUENIL) tablet 200 mg     200 mg Oral 2 times daily 01/11/19 2105     01/11/19 1830  vancomycin (VANCOCIN) 1,500 mg in sodium chloride 0.9 %  500 mL IVPB  Status:  Discontinued     1,500 mg 250 mL/hr over 120 Minutes Intravenous  Once 01/11/19 1817 01/12/19 0426   01/11/19 1830  ceFEPIme (MAXIPIME) 2 g in sodium chloride 0.9 % 100 mL IVPB  Status:  Discontinued     2 g 200 mL/hr over 30 Minutes Intravenous Every 24 hours 01/11/19 1817 01/16/19 0928   01/11/19 1818  vancomycin variable dose per unstable renal function (pharmacist dosing)  Status:  Discontinued      Does not apply See admin instructions 01/11/19 1818 01/15/19 0816   01/10/19 1845  cefTRIAXone (ROCEPHIN) 1 g in sodium chloride 0.9 % 100 mL IVPB     1 g 200 mL/hr over 30 Minutes Intravenous  Once 01/10/19 1835 01/10/19 2004       Subjective:  Today, patient seen during long-term EEG monitoring.  Denies any headache,dizziness, nausea or vomiting.  Denies shortness of breath cough or fever.  Objective: Vitals:   01/18/19 2347 01/19/19 0242  BP: (!) 147/107 (!) 151/95  Pulse: (!) 121 (!) 101  Resp: 19 14  Temp: 98.5 F (36.9 C) 98.5 F (36.9 C)  SpO2: 99% 100%    Intake/Output Summary (Last 24 hours) at 01/19/2019 0728 Last data filed at 01/18/2019 1730 Gross per 24 hour  Intake --  Output 200 ml  Net -200 ml   Filed Weights   01/15/19 0500 01/16/19 0507 01/17/19 0343  Weight: 87.1 kg 84.5 kg 83.4 kg   Body mass index is 33.63 kg/m.   Physical Exam: GENERAL: Patient is obese, not in obvious distress, alert awake and communicative. HENT: No scleral pallor or icterus. Pupils equally reactive to light. Oral mucosa is moist, EEG leads in place. NECK: is supple, no palpable thyroid enlargement. CHEST: Diminished breath sounds bilaterally.  No obvious crackles or wheezes noted. CVS: S1 and S2 heard, no murmur. Regular rate and rhythm. No pericardial rub. ABDOMEN: Soft, non-tender, bowel sounds are present.  Foley catheter in place No palpable hepato-splenomegaly. EXTREMITIES: No edema.  Legs with discoloration PSYCH: Alert awake communicative CNS:  Cranial nerves are intact. No focal motor or sensory deficits. SKIN: Skin changes and discoloration from lupus  Data Review: I have personally reviewed the following laboratory data and studies,  CBC: Recent Labs  Lab 01/15/19 1815 01/16/19 0227 01/17/19 0941 01/18/19 0304 01/19/19 0311  WBC 13.4* 18.4* 15.3* 15.1* 10.8*  NEUTROABS 12.3* 15.3* 12.0* 11.7* 7.0  HGB 9.1* 8.2* 8.8* 7.7* 7.4*  HCT 28.2* 24.8* 28.7* 24.6* 23.7*  MCV 70.0* 69.1* 73.4* 73.0* 72.0*  PLT 371 318 183 249 628   Basic Metabolic Panel: Recent Labs  Lab 01/15/19 1815 01/16/19 0227 01/17/19 0941 01/18/19 0304 01/19/19 0311  NA 142 146* 148* 147* 145  K 3.6 3.1* 4.1 3.7 3.4*  CL 100 107 107 105 102  CO2 '27 30 28 ' 32 30  GLUCOSE 105* 91 66* 82 68*  BUN 34* 34* 33* 31* 24*  CREATININE 2.83* 2.84* 3.03* 3.00* 3.01*  CALCIUM 8.6* 8.2* 8.5* 8.3* 8.3*  MG 2.3 2.0 1.8 1.6* 1.6*  PHOS 2.2* 2.1* 2.3* 2.4* 3.1   Liver Function Tests: Recent Labs  Lab 01/15/19 1815 01/16/19 0227 01/17/19 0941 01/18/19 0304 01/19/19 0311  AST '18 16 21 21 ' 36  ALT '16 15 17 15 22  ' ALKPHOS 93 76 83 75 75  BILITOT 0.9 0.7 0.9 1.0 0.9  PROT 6.4* 5.3* 5.3* 4.9* 4.8*  ALBUMIN 2.9* 2.4* 2.5* 2.4* 2.5*   No results for input(s): LIPASE, AMYLASE in the last 168 hours. Recent Labs  Lab 01/17/19 1146  AMMONIA 14   Cardiac Enzymes: No results for input(s): CKTOTAL, CKMB, CKMBINDEX, TROPONINI in the last 168 hours. BNP (last 3 results) No results for input(s): BNP in the last 8760 hours.  ProBNP (last 3 results) No results for input(s): PROBNP in the last 8760 hours.  CBG: Recent Labs  Lab 01/16/19 0455  GLUCAP 80   Recent Results (from the past 240 hour(s))  SARS Coronavirus 2 Ag (30 min TAT) - Nasal Swab (BD Veritor Kit)     Status: None   Collection Time: 01/10/19  3:49 PM   Specimen: Nasal Swab (BD Veritor Kit)  Result Value Ref Range Status   SARS Coronavirus 2 Ag NEGATIVE NEGATIVE Final    Comment:  (NOTE) SARS-CoV-2 antigen NOT DETECTED.  Negative results are presumptive.  Negative results do not preclude SARS-CoV-2 infection and should not be used as the sole basis for treatment or other patient management decisions, including infection  control decisions, particularly in the presence of clinical signs and  symptoms consistent with COVID-19, or in those who have been in contact with the virus.  Negative results must be combined with clinical observations, patient history, and epidemiological information. The expected result is Negative. Fact Sheet for Patients: PodPark.tn Fact Sheet for Healthcare  Providers: GiftContent.is This test is not yet approved or cleared by the [place] and  has been authorized for detection and/or diagnosis of SARS-CoV-2 by FDA under an Emergency Use Authorization (EUA).  This EUA will remain in effect (meaning this test can be used) for the duration of  the COVID-19 de claration under Section 564(b)(1) of the Act, 21 U.S.C. section 360bbb-3(b)(1), unless the authorization is terminated or revoked sooner. Performed at [facility], [address] [place], [place] [address]   SARS CORONAVIRUS 2 (TAT 6-24 HRS) Nasopharyngeal Urine, Catheterized     Status: None   Collection Time: 01/10/19  6:03 PM   Specimen: Urine, Catheterized; Nasopharyngeal  Result Value Ref Range Status   SARS Coronavirus 2 NEGATIVE NEGATIVE Final    Comment: (NOTE) SARS-CoV-2 target nucleic acids are NOT DETECTED. The SARS-CoV-2 RNA is generally detectable in upper and lower respiratory specimens during the acute phase of infection. Negative results do not preclude SARS-CoV-2 infection, do not rule out co-infections with other pathogens, and should not be used as the sole basis for treatment or other patient management decisions. Negative results must be combined with clinical observations, patient  history, and epidemiological information. The expected result is Negative. Fact Sheet for Patients: SugarRoll.be Fact Sheet for Healthcare Providers: https://www.woods-mathews.com/ This test is not yet approved or cleared by the [place] FDA and  has been authorized for detection and/or diagnosis of SARS-CoV-2 by FDA under an Emergency Use Authorization (EUA). This EUA will remain  in effect (meaning this test can be used) for the duration of the COVID-19 declaration under Section 56 4(b)(1) of the Act, 21 U.S.C. section 360bbb-3(b)(1), unless the authorization is terminated or revoked sooner. Performed at [facility], [place] [address] [place], [place] [address]   Culture, blood (routine x 2)     Status: None   Collection Time: 01/11/19  6:02 PM   Specimen: BLOOD RIGHT HAND  Result Value Ref Range Status   Specimen Description BLOOD RIGHT HAND  Final   Special Requests   Final    BOTTLES DRAWN AEROBIC ONLY Blood Culture results may not be optimal due to an inadequate volume of blood received in culture bottles   Culture   Final    NO GROWTH 5 DAYS Performed at [facility], [place] [address], [place], [place] [address]    Report Status 01/16/2019 FINAL  Final  Culture, blood (routine x 2)     Status: None   Collection Time: 01/11/19  6:07 PM   Specimen: BLOOD LEFT HAND  Result Value Ref Range Status   Specimen Description BLOOD LEFT HAND  Final   Special Requests   Final    BOTTLES DRAWN AEROBIC ONLY Blood Culture results may not be optimal due to an inadequate volume of blood received in culture bottles   Culture   Final    NO GROWTH 5 DAYS Performed at [facility], [place] [address] [place], [place] [address]    Report Status 01/16/2019 FINAL  Final  Urine culture     Status: None   Collection Time: 01/12/19  5:10 PM   Specimen: Urine, Clean Catch  Result Value Ref Range Status   Specimen Description URINE,  CLEAN CATCH  Final   Special Requests Immunocompromised  Final   Culture   Final    NO GROWTH Performed at [facility], [place] [address], [place],  [address]    Report Status  01/13/2019 FINAL  Final  MRSA PCR Screening     Status: None   Collection Time: 01/12/19 11:05 PM   Specimen: Nasopharyngeal  Result Value Ref Range Status   MRSA by PCR NEGATIVE NEGATIVE Final    Comment:        The GeneXpert MRSA Assay (FDA approved for NASAL specimens only), is one component of a comprehensive MRSA colonization surveillance program. It is not intended to diagnose MRSA infection nor to guide or monitor treatment for MRSA infections. Performed at [facility], [place] [address] [place], [place] [address]   Culture, Urine     Status: None   Collection Time: 01/16/19  5:57 PM   Specimen: Urine, Random  Result Value Ref Range Status   Specimen Description URINE, RANDOM  Final   Special Requests NONE  Final   Culture   Final    NO GROWTH Performed at [facility], [place] [address], [place], [place] [address]    Report Status 01/18/2019 FINAL  Final     Studies: No results found.  Scheduled Meds:  Chlorhexidine Gluconate Cloth  6 each Topical Daily   darbepoetin (ARANESP) injection - NON-DIALYSIS  150 mcg Subcutaneous Q Fri-1800   DULoxetine  60 mg Oral BID   heparin  5,000 Units Subcutaneous Q8H   hydroxychloroquine  200 mg Oral BID   levothyroxine  25 mcg Oral Q0600   lidocaine  1 patch Transdermal Q24H   pantoprazole  40 mg Oral BID   phenytoin (DILANTIN) IV  100 mg Intravenous Q8H   polyethylene glycol  17 g Oral BID   predniSONE  30 mg Oral Q breakfast   senna-docusate  1 tablet Oral BID    Continuous Infusions:  cefTRIAXone (ROCEPHIN)  IV 1 g (01/17/19 1659)     Flora Lipps, MD  [facility] 01/19/2019

## 2019-01-19 NOTE — Progress Notes (Addendum)
NEUROLOGY PROGRESS NOTE  Subjective: Patient remains encephalopathic.  Has no complaints.  Exam: Vitals:   01/19/19 0242 01/19/19 0750  BP: (!) 151/95 (!) 146/88  Pulse: (!) 101 98  Resp: 14 (!) 21  Temp: 98.5 F (36.9 C) 98.9 F (37.2 C)  SpO2: 100% 92%    ROS Unable to obtain secondary to severe encephalopathy  Physical Exam  Constitutional: Appears well-developed and well-nourished.  Psych: Confused Eyes: No scleral injection HENT: No OP obstrucion Head: Normocephalic.  Cardiovascular: Normal rate and regular rhythm.  Respiratory: Effort normal, non-labored breathing GI: Soft.  No distension. There is no tenderness.  Skin: WDI with bruises on both legs and shins   Neuro:  Mental Status: Patient is alert, upon entering the room patient is very cockeyed in bed.  Patient is singing.  She is attentive but does not follow commands and tends to perseverate on words that are said to her. Cranial Nerves: II: Blinks to threat bilaterally,  III,IV, VI:  extra-ocular motions intact bilaterally pupils equal, round, reactive to light and accommodation V,VII: Face symmetric, VIII: hearing intact to voice Motor: All extremities purposefully and antigravity. Sensory: Sensation intact to stimuli and withdraws from noxious stimuli Deep Tendon Reflexes: 2+ and symmetric throughout Plantars: Right: downgoing   Left: downgoing     Medications:  Scheduled: . Chlorhexidine Gluconate Cloth  6 each Topical Daily  . darbepoetin (ARANESP) injection - NON-DIALYSIS  150 mcg Subcutaneous Q Fri-1800  . DULoxetine  60 mg Oral BID  . heparin  5,000 Units Subcutaneous Q8H  . hydroxychloroquine  200 mg Oral BID  . levothyroxine  25 mcg Oral Q0600  . lidocaine  1 patch Transdermal Q24H  . pantoprazole  40 mg Oral BID  . phenytoin (DILANTIN) IV  100 mg Intravenous Q8H  . polyethylene glycol  17 g Oral BID  . predniSONE  30 mg Oral Q breakfast  . senna-docusate  1 tablet Oral BID    Continuous: . cefTRIAXone (ROCEPHIN)  IV 1 g (01/17/19 1659)   INO:[id number] **OR** acetaminophen, acetaminophen, HYDROcodone-acetaminophen, HYDROmorphone (DILAUDID) injection, ondansetron **OR** ondansetron (ZOFRAN) IV, prochlorperazine  Pertinent Labs/Diagnostics: BUN 24 Creatinine 3.01   Overnight EEG: Shows continuous slowing, generalized along with triphasic waves maximal bifrontal.  Shows evidence of moderate to severe diffuse encephalopathy, nonspecific etiology but could be secondary to toxic metabolic causes.  No seizure or definite epileptiform discharges were seen throughout the recording.  Etta Quill PA-C [facility] [phone]  Assessment: 63 year old female with a complicated past medical history, admitted for management of worsening renal function as well as concerns for sepsis.  Neurology consulted for progressive worsening of mental status.  On arrival she was able to converse, currently in a delirious to somnolent state.  She had new onset of generalized tonic-clonic seizures x2 overnight on 12 09/2018. -Overnight EEG did not show any epileptiform activity.  It did show triphasic waves consistent with a diffuse, nonspecific encephalopathy. -Patient was given loading dose of fosphenytoin followed by Dilantin 100 mg IV 3 times daily -On assessment today patient is more alert today than exam obtained yesterday.  In fact, she is in bed singing.  However, she continues to be encephalopathic and unable to participate in exam.   -- Continues to have an elevated WBC (15.3)   Recommendations: -Continue seizure precautions -Continue to resolve toxic/metabolic medical abnormalities -We will obtain Dilantin level and continue Dilantin at 100 mg TID IV  Electronically signed: Dr. Kerney Elbe 01/19/2019, 9:52 AM

## 2019-01-19 NOTE — Procedures (Addendum)
Patient Name: Carolyn Zamora  MRN: [id number]  Epilepsy Attending: Lora Havens  Referring Physician/Provider: Dr. Kerney Elbe Duration:01/18/2019 1359 to 01/19/2019 1359  Patient history: 63 year old female admitted for worsening renal function as well as concern for sepsis.  EEG obtained for altered mental status, to assess for seizures.  Level of alertness: Awake/lethargic, sleep  AEDs during EEG study: Phenytoin  Technical aspects: This EEG study was done with scalp electrodes positioned according to the 10-20 International system of electrode placement. Electrical activity was acquired at a sampling rate of 500Hz  and reviewed with a high frequency filter of 70Hz  and a low frequency filter of 1Hz . EEG data were recorded continuously and digitally stored.   Description: EEG showed continuous generalized 2-5 Hz theta-delta slowing.  Triphasic waves, generalized, maximal bifrontal, at 2-2.5Hz  were also seen, more frequent when patient was stimulated. Sharp transients were seen in left frontotemporal region. No clear posterior dominant rhythm was seen.  Hyperventilation and photic stimulation were not performed.  Abnormality -Continuous slow, generalized -Triphasic waves, generalized, maximal bifrontal  IMPRESSION: This study showed evidence of moderate to severe diffuse encephalopathy, nonspecific etiology but could be secondary to toxic-metabolic causes.  No seizures or definite epileptiform discharges were seen throughout the recording.

## 2019-01-20 ENCOUNTER — Inpatient Hospital Stay (HOSPITAL_COMMUNITY): Payer: Medicare Other

## 2019-01-20 LAB — BASIC METABOLIC PANEL
Anion gap: 14 (ref 5–15)
BUN: 22 mg/dL (ref 8–23)
CO2: 25 mmol/L (ref 22–32)
Calcium: 7.9 mg/dL — ABNORMAL LOW (ref 8.9–10.3)
Chloride: 107 mmol/L (ref 98–111)
Creatinine, Ser: 3.16 mg/dL — ABNORMAL HIGH (ref 0.44–1.00)
GFR calc Af Amer: 17 mL/min — ABNORMAL LOW (ref >60)
GFR calc non Af Amer: 15 mL/min — ABNORMAL LOW (ref >60)
Glucose, Bld: 45 mg/dL — ABNORMAL LOW (ref 70–99)
Potassium: 3.8 mmol/L (ref 3.5–5.1)
Sodium: 146 mmol/L — ABNORMAL HIGH (ref 135–145)

## 2019-01-20 LAB — PREPARE RBC (CROSSMATCH)

## 2019-01-20 LAB — CBC
HCT: 21.6 % — ABNORMAL LOW (ref 36.0–46.0)
Hemoglobin: 6.5 g/dL — CL (ref 12.0–15.0)
MCH: 22.4 pg — ABNORMAL LOW (ref 26.0–34.0)
MCHC: 30.1 g/dL (ref 30.0–36.0)
MCV: 74.5 fL — ABNORMAL LOW (ref 80.0–100.0)
Platelets: 197 10*3/uL (ref 150–400)
RBC: 2.9 MIL/uL — ABNORMAL LOW (ref 3.87–5.11)
RDW: 16.1 % — ABNORMAL HIGH (ref 11.5–15.5)
WBC: 8.6 10*3/uL (ref 4.0–10.5)
nRBC: 10.9 % — ABNORMAL HIGH (ref 0.0–0.2)

## 2019-01-20 LAB — MAGNESIUM: Magnesium: 1.5 mg/dL — ABNORMAL LOW (ref 1.7–2.4)

## 2019-01-20 MED ORDER — DIPHENHYDRAMINE HCL 25 MG PO CAPS
25.0000 mg | ORAL_CAPSULE | Freq: Every evening | ORAL | Status: DC | PRN
  Administered 2019-01-20 – 2019-01-23 (×4): 25 mg via ORAL
  Filled 2019-01-20 (×5): qty 1

## 2019-01-20 MED ORDER — SODIUM CHLORIDE 0.9% IV SOLUTION: Freq: Once | INTRAVENOUS | Status: DC

## 2019-01-20 MED ORDER — SODIUM CHLORIDE 0.9% IV SOLUTION
Freq: Once | INTRAVENOUS | Status: AC
  Administered 2019-01-20: 15:00:00 via INTRAVENOUS

## 2019-01-20 NOTE — Progress Notes (Signed)
PROGRESS NOTE  Carolyn Zamora DHR:[id number] DOB: 1955-05-19 DOA: 01/10/2019 PCP: Beckie Salts, MD   LOS: 9 days   Brief narrative: [name] a 63 y.o.femalewith medical history significant ofrenal cell carcinoma status post left nephrectomy, history of splenectomy, CKD stage III, chronic pain syndrome, morbid obesity, lupus-on chronic immunosuppressive therapy, hypertension, anemia of chronic disease was transferred from [facility] for the concern of sepsis secondary to UTI and worsening kidney function. She also complained of the severe bilateral lower extremity pain with difficulty ambulation decreased appetite fatigue and lethargy.  X-ray of right foot obtained at [facility]-which came back negative.She lives alone at home and her daughter and sister helps her.   At [facility]: Patient was tachycardic, tachypneic, leukocytosis of 20.3, UA positive for small leukocytes. CK, lipase level: WNL, COVID-19 negative, magnesium: Low--was replaced. CMP showed worsening kidney function. Patient received IV fluids and Rocephin for the concern of urosepsis. Patient was then transferred to [facility] for further evaluation and management.  Assessment/Plan:  Principal Problem:   Sepsis ([place]) Active Problems:   Hypertension   Systemic lupus erythematosus (HCC)   Chronic pain syndrome   Renal cell carcinoma of left kidney s/p nephrectomy   Anemia of chronic disease   Renal failure ([facility]), acute on chronic (HCC)   Pain in both feet   Hypomagnesemia  Sepsisof Unknown Etiology but likely in the setting of Pyelonephritis/urinary retention and hydronephrosis -Abnormal urinalysis, urine culture negative so far. MRSA PCR was negative. Off vancomycin.  Leukocytosis present but trending down.  CT scan abdomen on 01/15/2019 reviewed which showed a small left pleural effusion mild right hydronephrosis and perinephric stranding.COVID-19 negative, lipase within normal limits.   Patient does not have chronic leukocytosis although she is on prednisone at home.  WBC today however is 8.6.  Blood cultures 01/11/2019 and showed NGTD at 5 Days.   Bilateral Feet Pain with tophaceous gout. Duplex ultrasound was negative for DVT. ,History of gout with LDH was 238, Uric Acid was 11.5, and ESR was <4;  Avoid NSAIDs due to underlying renal failure; on prednisone 30 mg now.  We will continue to gradually taper down.  On Norco and duloxetine.  Repeat Uric Acid Level was 10.3  Mild hypomagnesemia.  Will closely monitor.  Magnesium of 1.5 today.  On oral magnesium.  We will continue to monitor  Mild hypokalemia.  Replenished orally.  Potassium 3.8 today.  AKI on CKD stage IV Metabolic Acidosis, improved Likely secondary to sepsis. was on IV Sodium Bicarbonate and then D5W;  creatinine today of 3.1 and has remained stable and plateaued. Patient has history of left nephrectomy due to renal cell carcinoma. Repeat CT Scan showed mild Hydronephrosis.  Continue to hold Lasix.  Nephrology on board.  Urology was consulted about perinephric stranding and hydronephrosis.  Urology recommended follow-up renal ultrasound today.  Microcytic Anemia/Anemia of Chronic Kidney Disease  Received darbepoetin alfa 150 mcg subcu ,No signs of active bleeding, monitor H&H closely.  Hemoglobin is 6.5 today.  No obvious bleeding reported.  Will transfuse 1 unit of packed RBC.  Check CBC in a.m.  Lupus, currently not in flare -Mycophenolate hold due to somnolence, now improved.  Could consider starting by the a.m. prednisone taper down to 5 mg in the coming weeks if she is able to tolerate p.o. Continue hydroxychloroquine 200 mg po BID   hypertension  -On Amlodipine at home but this was held due to Sepsis .  Amlodipine was initiated yesterday.    Depression/Anxiety  Continue duloxetine.  Continue to hold trazodone due to somnolence  GERD On Pantoprazole -continue  Chronic Pain Syndrome Continue Norco,  duloxetine.    Off tizanidine and Lyrica.    Obesity Lifestyle modification advised  Left Arm Swelling From IV infiltration.  Supportive care  Hypernatremia Improved with D5 water.  Sodium level of 146 today.  On oral fluids now  Acute Urinary Retention -Seen by [facility], urology on board for mild hydronephrosis.  On a Foley catheter.  Check renal ultrasound today.  Foley management as per urology  Metabolic encephalopathy  -Improving.  Ammonia Level was 14.  CT head scan was negative for acute findings.  EEG showed severe encephalopathy, nonspecific finding. Neurology on board and recommend treating metabolic issues.  Long-term EEG report pending.  Confusion and Seizure Like Activity with witnessed Tonic-Clonic Seizures x2  Neurology on board and is on long-term EEG .  Patient was loaded with IV Fosphenytoin 1500 mg and continue on Dilantin.  Keppra is not a good choice given her impaired renal function.  Follow neurology recommendations.  Phenytoin level of 9.5 borderline low.  VTE Prophylaxis: Hold Heparin subcu today.  Code Status: Full code  Family Communication:  None today.  Disposition Plan: Home health PT likely in 1 to 2 days, home health  orders have been placed.  Follow neurology recommendation, long-term EEG findings, urology managing hydronephrosis and Foley catheter.  Transfuse PRBC today  Consultants:  Neurology,   nephrology,   [facility]  Procedures:  Foley catheter  Antibiotics: Anti-infectives (From admission, onward)   Start     Dose/Rate Route Frequency Ordered Stop   01/16/19 1630  cefTRIAXone (ROCEPHIN) 1 g in sodium chloride 0.9 % 100 mL IVPB     1 g 200 mL/hr over 30 Minutes Intravenous Every 24 hours 01/16/19 1608     01/14/19 0600  vancomycin (VANCOCIN) IVPB 1000 mg/200 mL premix  Status:  Discontinued     1,000 mg 200 mL/hr over 60 Minutes Intravenous Every 48 hours 01/12/19 1041 01/15/19 0816   01/12/19 0445  vancomycin  (VANCOCIN) 1,500 mg in sodium chloride 0.9 % 500 mL IVPB     1,500 mg 250 mL/hr over 120 Minutes Intravenous NOW 01/12/19 0426 01/12/19 0750   01/11/19 2200  hydroxychloroquine (PLAQUENIL) tablet 200 mg     200 mg Oral 2 times daily 01/11/19 2105     01/11/19 1830  vancomycin (VANCOCIN) 1,500 mg in sodium chloride 0.9 % 500 mL IVPB  Status:  Discontinued     1,500 mg 250 mL/hr over 120 Minutes Intravenous  Once 01/11/19 1817 01/12/19 0426   01/11/19 1830  ceFEPIme (MAXIPIME) 2 g in sodium chloride 0.9 % 100 mL IVPB  Status:  Discontinued     2 g 200 mL/hr over 30 Minutes Intravenous Every 24 hours 01/11/19 1817 01/16/19 0928   01/11/19 1818  vancomycin variable dose per unstable renal function (pharmacist dosing)  Status:  Discontinued      Does not apply See admin instructions 01/11/19 1818 01/15/19 0816   01/10/19 1845  cefTRIAXone (ROCEPHIN) 1 g in sodium chloride 0.9 % 100 mL IVPB     1 g 200 mL/hr over 30 Minutes Intravenous  Once 01/10/19 1835 01/10/19 2004     Subjective:  Today, patient still undergoing long-term EEG monitoring.  Denies any dizziness, lightheadedness shortness of breath cough fever.  No further report of seizure-like activity during hospitalization.  Objective: Vitals:   01/19/19 2300 01/20/19 0335  BP: (!) 173/65 Marland Kitchen)  156/64  Pulse: (!) 106 (!) 102  Resp: 19 (!) 21  Temp: 97.6 F (36.4 C) 97.7 F (36.5 C)  SpO2: 100% 98%   No intake or output data in the 24 hours ending 01/20/19 0804 Filed Weights   01/15/19 0500 01/16/19 0507 01/17/19 0343  Weight: 87.1 kg 84.5 kg 83.4 kg   Body mass index is 33.63 kg/m.   Physical Exam: GENERAL: Patient is obese, not in obvious distress, alert awake and communicative. HENT: No scleral pallor or icterus. Pupils equally reactive to light. Oral mucosa is moist, EEG leads in place. NECK: is supple, no palpable thyroid enlargement. CHEST: Diminished breath sounds bilaterally.  No obvious crackles or wheezes  noted. CVS: S1 and S2 heard, no murmur. Regular rate and rhythm. No pericardial rub. ABDOMEN: Soft, non-tender, bowel sounds are present.  Foley catheter in place No palpable hepato-splenomegaly. EXTREMITIES: No edema.  Lower legs with discoloration PSYCH: Alert awake communicative.  Oriented to time place and person.  Could not answer the president. CNS: Cranial nerves are intact. No focal motor or sensory deficits. SKIN: Skin changes and discoloration from lupus  Data Review: I have personally reviewed the following laboratory data and studies,  CBC: Recent Labs  Lab 01/15/19 1815 01/16/19 0227 01/17/19 0941 01/18/19 0304 01/19/19 0311 01/20/19 0227  WBC 13.4* 18.4* 15.3* 15.1* 10.8* 8.6  NEUTROABS 12.3* 15.3* 12.0* 11.7* 7.0  --   HGB 9.1* 8.2* 8.8* 7.7* 7.4* 6.5*  HCT 28.2* 24.8* 28.7* 24.6* 23.7* 21.6*  MCV 70.0* 69.1* 73.4* 73.0* 72.0* 74.5*  PLT 371 318 183 249 242 542   Basic Metabolic Panel: Recent Labs  Lab 01/15/19 1815 01/16/19 0227 01/17/19 0941 01/18/19 0304 01/19/19 0311 01/20/19 0227  NA 142 146* 148* 147* 145 146*  K 3.6 3.1* 4.1 3.7 3.4* 3.8  CL 100 107 107 105 102 107  CO2 '27 30 28 ' 32 30 25  GLUCOSE 105* 91 66* 82 68* 45*  BUN 34* 34* 33* 31* 24* 22  CREATININE 2.83* 2.84* 3.03* 3.00* 3.01* 3.16*  CALCIUM 8.6* 8.2* 8.5* 8.3* 8.3* 7.9*  MG 2.3 2.0 1.8 1.6* 1.6* 1.5*  PHOS 2.2* 2.1* 2.3* 2.4* 3.1  --    Liver Function Tests: Recent Labs  Lab 01/15/19 1815 01/16/19 0227 01/17/19 0941 01/18/19 0304 01/19/19 0311  AST '18 16 21 21 ' 36  ALT '16 15 17 15 22  ' ALKPHOS 93 76 83 75 75  BILITOT 0.9 0.7 0.9 1.0 0.9  PROT 6.4* 5.3* 5.3* 4.9* 4.8*  ALBUMIN 2.9* 2.4* 2.5* 2.4* 2.5*   No results for input(s): LIPASE, AMYLASE in the last 168 hours. Recent Labs  Lab 01/17/19 1146  AMMONIA 14   Cardiac Enzymes: No results for input(s): CKTOTAL, CKMB, CKMBINDEX, TROPONINI in the last 168 hours. BNP (last 3 results) No results for input(s): BNP in the  last 8760 hours.  ProBNP (last 3 results) No results for input(s): PROBNP in the last 8760 hours.  CBG: Recent Labs  Lab 01/16/19 0455  GLUCAP 80   Recent Results (from the past 240 hour(s))  SARS Coronavirus 2 Ag (30 min TAT) - Nasal Swab (BD Veritor Kit)     Status: None   Collection Time: 01/10/19  3:49 PM   Specimen: Nasal Swab (BD Veritor Kit)  Result Value Ref Range Status   SARS Coronavirus 2 Ag NEGATIVE NEGATIVE Final    Comment: (NOTE) SARS-CoV-2 antigen NOT DETECTED.  Negative results are presumptive.  Negative results do not preclude SARS-CoV-2 infection  and should not be used as the sole basis for treatment or other patient management decisions, including infection  control decisions, particularly in the presence of clinical signs and  symptoms consistent with COVID-19, or in those who have been in contact with the virus.  Negative results must be combined with clinical observations, patient history, and epidemiological information. The expected result is Negative. Fact Sheet for Patients: PodPark.tn Fact Sheet for Healthcare Providers: GiftContent.is This test is not yet approved or cleared by the [place] FDA and  has been authorized for detection and/or diagnosis of SARS-CoV-2 by FDA under an Emergency Use Authorization (EUA).  This EUA will remain in effect (meaning this test can be used) for the duration of  the COVID-19 de claration under Section 564(b)(1) of the Act, 21 U.S.C. section 360bbb-3(b)(1), unless the authorization is terminated or revoked sooner. Performed at [facility], [address] [place], [place] [address]   SARS CORONAVIRUS 2 (TAT 6-24 HRS) Nasopharyngeal Urine, Catheterized     Status: None   Collection Time: 01/10/19  6:03 PM   Specimen: Urine, Catheterized; Nasopharyngeal  Result Value Ref Range Status   SARS Coronavirus 2 NEGATIVE NEGATIVE Final     Comment: (NOTE) SARS-CoV-2 target nucleic acids are NOT DETECTED. The SARS-CoV-2 RNA is generally detectable in upper and lower respiratory specimens during the acute phase of infection. Negative results do not preclude SARS-CoV-2 infection, do not rule out co-infections with other pathogens, and should not be used as the sole basis for treatment or other patient management decisions. Negative results must be combined with clinical observations, patient history, and epidemiological information. The expected result is Negative. Fact Sheet for Patients: SugarRoll.be Fact Sheet for Healthcare Providers: https://www.woods-mathews.com/ This test is not yet approved or cleared by the [place] FDA and  has been authorized for detection and/or diagnosis of SARS-CoV-2 by FDA under an Emergency Use Authorization (EUA). This EUA will remain  in effect (meaning this test can be used) for the duration of the COVID-19 declaration under Section 56 4(b)(1) of the Act, 21 U.S.C. section 360bbb-3(b)(1), unless the authorization is terminated or revoked sooner. Performed at [facility], [place] [address] [place], [place] [address]   Culture, blood (routine x 2)     Status: None   Collection Time: 01/11/19  6:02 PM   Specimen: BLOOD RIGHT HAND  Result Value Ref Range Status   Specimen Description BLOOD RIGHT HAND  Final   Special Requests   Final    BOTTLES DRAWN AEROBIC ONLY Blood Culture results may not be optimal due to an inadequate volume of blood received in culture bottles   Culture   Final    NO GROWTH 5 DAYS Performed at [facility], [place] [address], [place], [place] [address]    Report Status 01/16/2019 FINAL  Final  Culture, blood (routine x 2)     Status: None   Collection Time: 01/11/19  6:07 PM   Specimen: BLOOD LEFT HAND  Result Value Ref Range Status   Specimen Description BLOOD LEFT HAND  Final   Special Requests   Final     BOTTLES DRAWN AEROBIC ONLY Blood Culture results may not be optimal due to an inadequate volume of blood received in culture bottles   Culture   Final    NO GROWTH 5 DAYS Performed at [facility], [place] [address] [place] [address], [place], [place] [address]    Report Status 01/16/2019 FINAL  Final  Urine culture     Status: None   Collection Time: 01/12/19  5:10 PM   Specimen: Urine, Clean Catch  Result Value Ref Range Status   Specimen Description URINE, CLEAN CATCH  Final   Special Requests Immunocompromised  Final   Culture   Final    NO GROWTH Performed at [facility], [address], [place], [place] [address]    Report Status 01/13/2019 FINAL  Final  MRSA PCR Screening     Status: None   Collection Time: 01/12/19 11:05 PM   Specimen: Nasopharyngeal  Result Value Ref Range Status   MRSA by PCR NEGATIVE NEGATIVE Final    Comment:        The GeneXpert MRSA Assay (FDA approved for NASAL specimens only), is one component of a comprehensive MRSA colonization surveillance program. It is not intended to diagnose MRSA infection nor to guide or monitor treatment for MRSA infections. Performed at [facility], [place] [address], [place], [place] [address]   Culture, Urine     Status: None   Collection Time: 01/16/19  5:57 PM   Specimen: Urine, Random  Result Value Ref Range Status   Specimen Description URINE, RANDOM  Final   Special Requests NONE  Final   Culture   Final    NO GROWTH Performed at [facility], [place] [address] [place], [place] [address]    Report Status 01/18/2019 FINAL  Final     Studies: Overnight EEG with video  Result Date: 01/19/2019 Lora Havens, MD     01/19/2019  9:43 AM Patient Name: Nadia Viar MRN: [id number] Epilepsy Attending: Lora Havens Referring Physician/Provider: Dr. Kerney Elbe Duration:01/18/2019 1359 to 01/19/2019 0930 Patient history: 63 year old female admitted for worsening renal function as well as concern for  sepsis.  EEG obtained for altered mental status, to assess for seizures. Level of alertness: Awake/lethargic, sleep AEDs during EEG study: Phenytoin Technical aspects: This EEG study was done with scalp electrodes positioned according to the 10-20 International system of electrode placement. Electrical activity was acquired at a sampling rate of '500Hz'  and reviewed with a high frequency filter of '70Hz'  and a low frequency filter of '1Hz' . EEG data were recorded continuously and digitally stored. Description: EEG showed continuous generalized 2-5 Hz theta-delta slowing.  Triphasic waves, generalized, maximal bifrontal, at 2-2.'5Hz'  were also seen, more frequent when patient was stimulated.  No clear posterior dominant rhythm was seen.  Hyperventilation and photic stimulation were not performed. Abnormality -Continuous slow, generalized -Triphasic waves, generalized, maximal bifrontal IMPRESSION: This study showed evidence of moderate to severe diffuse encephalopathy, nonspecific etiology but could be secondary to toxic-metabolic causes.  No seizures or definite epileptiform discharges were seen throughout the recording.    Scheduled Meds:  sodium chloride   Intravenous Once   sodium chloride   Intravenous Once   amLODipine  10 mg Oral Daily   Chlorhexidine Gluconate Cloth  6 each Topical Daily   darbepoetin (ARANESP) injection - NON-DIALYSIS  150 mcg Subcutaneous Q Fri-1800   DULoxetine  60 mg Oral BID   hydroxychloroquine  200 mg Oral BID   levothyroxine  25 mcg Oral Q0600   lidocaine  1 patch Transdermal Q24H   magnesium oxide  400 mg Oral BID   pantoprazole  40 mg Oral Daily   phenytoin (DILANTIN) IV  100 mg Intravenous Q8H   polyethylene glycol  17 g Oral BID   predniSONE  30 mg Oral Q breakfast  senna-docusate  1 tablet Oral BID    Continuous Infusions:  cefTRIAXone (ROCEPHIN)  IV 1 g (01/19/19 1634)     Flora Lipps, MD  [facility] 01/20/2019

## 2019-01-20 NOTE — Plan of Care (Signed)

## 2019-01-20 NOTE — Procedures (Addendum)
Patient Name: Carolyn Zamora  MRN: [id number]  Epilepsy Attending: Lora Havens  Referring Physician/Provider: Dr. Kerney Elbe Duration:01/19/2019 1359 to 01/20/2019 1001  Patient history: 63 year old female admitted for worsening renal function as well as concern for sepsis.  EEG obtained for altered mental status, to assess for seizures.  Level of alertness: Awake/lethargic, sleep  AEDs during EEG study: Phenytoin  Technical aspects: This EEG study was done with scalp electrodes positioned according to the 10-20 International system of electrode placement. Electrical activity was acquired at a sampling rate of 500Hz  and reviewed with a high frequency filter of 70Hz  and a low frequency filter of 1Hz . EEG data were recorded continuously and digitally stored.   Description: EEG showed continuous generalized 2-5 Hz theta-delta slowing.  Triphasic waves, generalized, maximal bifrontal, at 2-2.5Hz  were also seen, more frequent when patient was stimulated. Sharp transients were seen in left frontotemporal region. No clear posterior dominant rhythm was seen.  Hyperventilation and photic stimulation were not performed.  Abnormality -Continuous slow, generalized -Triphasic waves, generalized, maximal bifrontal  IMPRESSION: This study showed evidence of moderate to severe diffuse encephalopathy, nonspecific etiology but could be secondary to toxic-metabolic causes.  No seizures or definite epileptiform discharges were seen throughout the recording.  EEG appears improved compared to previous study.

## 2019-01-20 NOTE — Progress Notes (Signed)
Reviewed renal US from today. Resolution of right hydro with decompressed bladder (foley in place).  No further intervention recommended from urology.   Renal US 12/10 CLINICAL DATA:  Renal failure, history of left nephrectomy  EXAM: RENAL / URINARY TRACT ULTRASOUND COMPLETE  COMPARISON:  01/11/2019  FINDINGS: Right Kidney:  Renal measurements: 11.1 x 4.9 x 5.3 cm = volume: 152 mL . Echogenicity within normal limits. There is a 1.6 cm cyst at the upper pole. No new mass or hydronephrosis visualized.  Left Kidney:  Absent  Bladder:  Decompressed by Foley catheter.  Other:  None.  IMPRESSION: No hydronephrosis.

## 2019-01-20 NOTE — Progress Notes (Signed)
CRITICAL VALUE ALERT  Critical Value: HGB 6.5  Date & Time Notied:  01/20/19 0340  Provider Notified: Y  Orders Received/Actions taken: awaiting

## 2019-01-20 NOTE — Progress Notes (Addendum)
NEUROLOGY PROGRESS NOTE  Subjective:  Has no complaints.  Still slightly confused but improved significantly  Exam: Vitals:   01/19/19 2300 01/20/19 0335  BP: (!) 173/65 (!) 156/64  Pulse: (!) 106 (!) 102  Resp: 19 (!) 21  Temp: 97.6 F (36.4 C) 97.7 F (36.5 C)  SpO2: 100% 98%    ROS General ROS: negative for - chills, fatigue, fever, night sweats, weight gain or weight loss Psychological ROS: negative for - behavioral disorder, hallucinations, memory difficulties, mood swings or suicidal ideation Ophthalmic ROS: negative for - blurry vision, double vision, eye pain or loss of vision ENT ROS: negative for - epistaxis, nasal discharge, oral lesions, sore throat, tinnitus or vertigo Allergy and Immunology ROS: negative for - hives or itchy/watery eyes Hematological and Lymphatic ROS: negative for - bleeding problems, bruising or swollen lymph nodes Endocrine ROS: negative for - galactorrhea, hair pattern changes, polydipsia/polyuria or temperature intolerance Respiratory ROS: negative for - cough, hemoptysis, shortness of breath or wheezing Cardiovascular ROS: negative for - chest pain, dyspnea on exertion, edema or irregular heartbeat Gastrointestinal ROS: negative for - abdominal pain, diarrhea, hematemesis, nausea/vomiting or stool incontinence Genito-Urinary ROS: negative for - dysuria, hematuria, incontinence or urinary frequency/urgency Musculoskeletal ROS: negative for - joint swelling or muscular weakness Neurological ROS: as noted in HPI Dermatological ROS: negative for rash and skin lesion changes   Physical Exam  Constitutional: Appears well-developed and well-nourished.  Psych: Continues to be slightly encephalopathic Eyes: No scleral injection HENT: No OP obstrucion Head: Normocephalic.  Cardiovascular: Normal rate and regular rhythm.  Respiratory: Effort normal, non-labored breathing GI: Soft.  No distension. Skin: WDI   Neuro:  Mental Status: Alert,  oriented to [facility], state but believes she is in a gel house. Speech fluent without evidence of aphasia.  Able to follow simple step commands without difficulty. Cranial Nerves: II:  Visual fields grossly normal,  III,IV, VI: ptosis not present, extra-ocular motions intact bilaterally pupils equal, round, reactive to light and accommodation V,VII: At rest slight left facial droop noted likely secondary to being edentulous VIII: hearing intact to voice Motor: Moving all extremities antigravity and purposefully Cerebellar: No dysmetria noticed with finger-nose and movements  Medications:  Scheduled: . sodium chloride   Intravenous Once  . sodium chloride   Intravenous Once  . amLODipine  10 mg Oral Daily  . Chlorhexidine Gluconate Cloth  6 each Topical Daily  . darbepoetin (ARANESP) injection - NON-DIALYSIS  150 mcg Subcutaneous Q Fri-1800  . DULoxetine  60 mg Oral BID  . hydroxychloroquine  200 mg Oral BID  . levothyroxine  25 mcg Oral Q0600  . lidocaine  1 patch Transdermal Q24H  . magnesium oxide  400 mg Oral BID  . pantoprazole  40 mg Oral Daily  . phenytoin (DILANTIN) IV  100 mg Intravenous Q8H  . polyethylene glycol  17 g Oral BID  . predniSONE  30 mg Oral Q breakfast  . senna-docusate  1 tablet Oral BID   Continuous: . cefTRIAXone (ROCEPHIN)  IV 1 g (01/19/19 1634)   VPX:TGGYIRSWNIOEV **OR** acetaminophen, acetaminophen, HYDROcodone-acetaminophen, HYDROmorphone (DILAUDID) injection, ondansetron **OR** ondansetron (ZOFRAN) IV, prochlorperazine  Pertinent Labs/Diagnostics: Dilantin level-corrected is 15.8 which is within therapeutic level  Overnight EEG with video  Result Date: 01/19/2019 Lora Havens, MD     01/20/2019  8:49 AM Patient Name: Carolyn Zamora MRN: [id number] Epilepsy Attending: Lora Havens Referring Physician/Provider: Dr. Kerney Elbe Duration:01/18/2019 1359 to 01/19/2019 1359 Patient history: 63 year old female admitted for worsening renal  function as well as concern for sepsis.  EEG obtained for altered mental status, to assess for seizures. Level of alertness: Awake/lethargic, sleep AEDs during EEG study: Phenytoin Technical aspects: This EEG study was done with scalp electrodes positioned according to the 10-20 International system of electrode placement. Electrical activity was acquired at a sampling rate of 500Hz  and reviewed with a high frequency filter of 70Hz  and a low frequency filter of 1Hz . EEG data were recorded continuously and digitally stored. Description: EEG showed continuous generalized 2-5 Hz theta-delta slowing.  Triphasic waves, generalized, maximal bifrontal, at 2-2.5Hz  were also seen, more frequent when patient was stimulated. Sharp transients were seen in left frontotemporal region. No clear posterior dominant rhythm was seen.  Hyperventilation and photic stimulation were not performed. Abnormality -Continuous slow, generalized -Triphasic waves, generalized, maximal bifrontal IMPRESSION: This study showed evidence of moderate to severe diffuse encephalopathy, nonspecific etiology but could be secondary to toxic-metabolic causes.  No seizures or definite epileptiform discharges were seen throughout the recording.   Etta Quill PA-C [facility] [phone]  Assessment: 63 year old female with complicated past medical history, admitted for management of worsening of renal function as well as concerns for sepsis.  Neurology consulted due to patient having new onset of generalized tonic-clonic seizures x 2 overnight on 01/18/2019. 1.  Overnight EEG x 2 nights did not show epileptiform activity, again showed continuous slow, generalized and triphasic waves, maximum in the bifrontal lobes.  Again study showed evidence of moderate to severe diffuse encephalopathy, nonspecific etiology but could be secondary to toxic/metabolic causes. 2.  Patient placed on Dilantin 100 mg IV 3 times a day with therapeutic level after  corrected 3.  On exam today patient is more alert, able to tell me that she is in [place] is in [place].  She is able to follow simple commands but still remains mildly encephalopathic.  Recommendations: 1.  Continue Dilantin 100 mg 3 times daily IV.  When able to take p.o. medications can switch to 300 mg nightly 2.  Continue to resolve toxic/metabolic/renal abnormalities-hypoglycemia 3.  At this point in time due to negative EEG for seizures, as well as improvement in mental status most likely being due to successful treatment of subclinical seizures with Dilantin, Neurology will sign off. Please call if there are additional questions.   Electronically signed: Dr. Kerney Elbe 01/20/2019, 8:54 AM

## 2019-01-20 NOTE — Progress Notes (Addendum)
  Subjective: Pt much more alert and interactive today.  Denies right flank pain or right abdominal pain.  Started having seizures on 12/8 but none overnight.  Foley in place since 12/8 and draining well.    Objective: Vital signs in last 24 hours: Temp:  [97.3 F (36.3 C)-98.9 F (37.2 C)] 97.7 F (36.5 C) (12/10 0335) Pulse Rate:  [98-106] 102 (12/10 0335) Resp:  [17-21] 21 (12/10 0335) BP: (146-173)/(64-93) 156/64 (12/10 0335) SpO2:  [92 %-100 %] 98 % (12/10 0335)  Intake/Output from previous day: No intake/output data recorded. Intake/Output this shift: No intake/output data recorded.  Physical Exam:  General: Alert and oriented CV: RRR Abdomen: Soft, ND, NT, no right CVA tenderness Ext: NT, No erythema  Lab Results: Recent Labs    01/18/19 0304 01/19/19 0311 01/20/19 0227  HGB 7.7* 7.4* 6.5*  HCT 24.6* 23.7* 21.6*   BMET Recent Labs    01/19/19 0311 01/20/19 0227  NA 145 146*  K 3.4* 3.8  CL 102 107  CO2 30 25  GLUCOSE 68* 45*  BUN 24* 22  CREATININE 3.01* 3.16*  CALCIUM 8.3* 7.9*     Studies/Results: Overnight EEG with video  Result Date: 01/19/2019 Lora Havens, MD     01/19/2019  9:43 AM Patient Name: Carolyn Zamora MRN: [id number] Epilepsy Attending: Lora Havens Referring Physician/Provider: Dr. Kerney Elbe Duration:01/18/2019 1359 to 01/19/2019 0930 Patient history: 63 year old female admitted for worsening renal function as well as concern for sepsis.  EEG obtained for altered mental status, to assess for seizures. Level of alertness: Awake/lethargic, sleep AEDs during EEG study: Phenytoin Technical aspects: This EEG study was done with scalp electrodes positioned according to the 10-20 International system of electrode placement. Electrical activity was acquired at a sampling rate of 500Hz  and reviewed with a high frequency filter of 70Hz  and a low frequency filter of 1Hz . EEG data were recorded continuously and digitally stored.  Description: EEG showed continuous generalized 2-5 Hz theta-delta slowing.  Triphasic waves, generalized, maximal bifrontal, at 2-2.5Hz  were also seen, more frequent when patient was stimulated.  No clear posterior dominant rhythm was seen.  Hyperventilation and photic stimulation were not performed. Abnormality -Continuous slow, generalized -Triphasic waves, generalized, maximal bifrontal IMPRESSION: This study showed evidence of moderate to severe diffuse encephalopathy, nonspecific etiology but could be secondary to toxic-metabolic causes.  No seizures or definite epileptiform discharges were seen throughout the recording.    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  Patient had new onset seizures overnight 12/8 and imaging consistent with encephalopathy.  Urine culture 12/6 negative.   Imaging showed new mild right hydronephrosis in setting of solitary kidney.  Creatinine had been trending down but now rising again.  Recommended foley be placed however it was placed then removed and replaced on 12/8.  -recommend renal US to evaluate for interval assessment of hydro on right side -continue foley catheter -if hydro still present may need PCN tube (ureteral stent may not be ideal due to encaphalopathy and poor candidate for OR)    LOS: 9 days   Lien Lyman D Cerita Rabelo 01/20/2019, 7:33 AM

## 2019-01-20 NOTE — Progress Notes (Deleted)
LAB called, RN notified of hgb being 6.5. on call MD contacted, awaiting orders.

## 2019-01-20 NOTE — Progress Notes (Signed)
LTM EEG discontinued - no skin breakdown at unhook.   

## 2019-01-21 LAB — BASIC METABOLIC PANEL
Anion gap: 10 (ref 5–15)
Anion gap: 11 (ref 5–15)
BUN: 19 mg/dL (ref 8–23)
BUN: 18 mg/dL (ref 8–23)
CO2: 28 mmol/L (ref 22–32)
CO2: 26 mmol/L (ref 22–32)
Calcium: 8.1 mg/dL — ABNORMAL LOW (ref 8.9–10.3)
Calcium: 7.9 mg/dL — ABNORMAL LOW (ref 8.9–10.3)
Chloride: 108 mmol/L (ref 98–111)
Chloride: 103 mmol/L (ref 98–111)
Creatinine, Ser: 3.71 mg/dL — ABNORMAL HIGH (ref 0.44–1.00)
Creatinine, Ser: 3.97 mg/dL — ABNORMAL HIGH (ref 0.44–1.00)
GFR calc Af Amer: 14 mL/min — ABNORMAL LOW (ref >60)
GFR calc Af Amer: 13 mL/min — ABNORMAL LOW (ref >60)
GFR calc non Af Amer: 12 mL/min — ABNORMAL LOW (ref >60)
GFR calc non Af Amer: 11 mL/min — ABNORMAL LOW (ref >60)
Glucose, Bld: 73 mg/dL (ref 70–99)
Glucose, Bld: 130 mg/dL — ABNORMAL HIGH (ref 70–99)
Potassium: 3.9 mmol/L (ref 3.5–5.1)
Potassium: 4.7 mmol/L (ref 3.5–5.1)
Sodium: 146 mmol/L — ABNORMAL HIGH (ref 135–145)
Sodium: 140 mmol/L (ref 135–145)

## 2019-01-21 LAB — CREATININE, URINE, RANDOM: Creatinine, Urine: 172.38 mg/dL

## 2019-01-21 LAB — CBC
HCT: 24.4 % — ABNORMAL LOW (ref 36.0–46.0)
Hemoglobin: 7.7 g/dL — ABNORMAL LOW (ref 12.0–15.0)
MCH: 24 pg — ABNORMAL LOW (ref 26.0–34.0)
MCHC: 31.6 g/dL (ref 30.0–36.0)
MCV: 76 fL — ABNORMAL LOW (ref 80.0–100.0)
Platelets: 167 10*3/uL (ref 150–400)
RBC: 3.21 MIL/uL — ABNORMAL LOW (ref 3.87–5.11)
RDW: 17.2 % — ABNORMAL HIGH (ref 11.5–15.5)
WBC: 11.9 10*3/uL — ABNORMAL HIGH (ref 4.0–10.5)
nRBC: 13.6 % — ABNORMAL HIGH (ref 0.0–0.2)

## 2019-01-21 LAB — SODIUM, URINE, RANDOM: Sodium, Ur: 69 mmol/L

## 2019-01-21 MED ORDER — DICYCLOMINE HCL 20 MG PO TABS
20.0000 mg | ORAL_TABLET | Freq: Two times a day (BID) | ORAL | Status: DC | PRN
  Filled 2019-01-21: qty 1

## 2019-01-21 MED ORDER — MYCOPHENOLATE MOFETIL 250 MG PO CAPS
500.0000 mg | ORAL_CAPSULE | Freq: Every morning | ORAL | Status: DC
  Administered 2019-01-23 – 2019-01-24 (×2): 500 mg via ORAL
  Filled 2019-01-21 (×3): qty 2

## 2019-01-21 MED ORDER — DEXTROSE-NACL 5-0.45 % IV SOLN
INTRAVENOUS | Status: AC
  Administered 2019-01-21 – 2019-01-22 (×2): via INTRAVENOUS

## 2019-01-21 MED ORDER — HEPARIN SODIUM (PORCINE) 5000 UNIT/ML IJ SOLN
5000.0000 [IU] | Freq: Three times a day (TID) | INTRAMUSCULAR | Status: DC
  Administered 2019-01-21 – 2019-01-24 (×9): 5000 [IU] via SUBCUTANEOUS
  Filled 2019-01-21 (×9): qty 1

## 2019-01-21 NOTE — Progress Notes (Signed)
Patient ID: Carolyn Zamora, female   DOB: 01-18-1956, 63 y.o.   MRN: [id number] [facility] Progress Note   I was asked by Dr. Louanne Belton to evaluate Ms. Matuszak for abrupt rise of her creatinine overnight with a drop of her urine output.  I evaluated her chart and do not see any recent iodinated intravenous contrast exposure or typical nephrotoxin exposures.  It does appear that she has recently been started on Dilantin after developing generalized tonic-clonic seizures.  Earlier in her hospitalization, we evaluated for possible lupus flare and her complement levels were normal and double-stranded DNA within normal range.  Based on this, we had started titrating down her corticosteroids.   Assessment/ Plan:   1. Acute kidney Injury on chronic kidney disease stage IV patient with solitary kidney baseline creatinine around 2.8: Etiology of acute rise of creatinine overnight appears to be unclear but typically in the hospital setting is ATN of unexplained origin/etiology.  She does have concomitant hypernatremia and metabolic alkalosis that makes me question her intravascular volume status.  She does not have edema on exam and does not have any acute electrolyte abnormality/indication for dialysis.  I will give her some fluids overnight and recheck a stat basic metabolic panel along with urine electrolytes.  She had mild urinary tract obstruction that seems to have been alleviated by Foley catheter placement. 2.  Urinary tract infection with sepsis: Appears to have improved clinically, she remains on ceftriaxone with resolution of sepsis markers. 3.  Systemic lupus erythematosus: Continue CellCept, hydroxychloroquine and currently prednisone 30 mg daily that is being weaned down to her outpatient dose of 5 mg daily. 4.  Generalized tonic-clonic seizures: Seen earlier by neurology who feel that these were consistent with her metabolic encephalopathy.  Status post intravenous Dilantin with  directions to switch to oral Dilantin when oral intake possible. 7.  Hypernatremia: She informs me that she is drinking plenty of fluids however, she has a nearly 2 L free water deficit based on labs.  Will begin D5/half-normal.  Subjective:   Reports to be feeling well, denies any chest pain or shortness of breath and would like to go home.   Objective:   BP 132/89 (BP Location: Right Wrist)   Pulse 99   Temp 98.3 F (36.8 C) (Axillary)   Resp (!) 22   Ht 5\' 2"  (1.575 m)   Wt 83.4 kg   SpO2 100%   BMI 33.63 kg/m   Intake/Output Summary (Last 24 hours) at 01/21/2019 1402 Last data filed at 01/20/2019 1700 Gross per 24 hour  Intake 315 ml  Output 350 ml  Net -35 ml   Weight change:   Physical Exam: Gen: Comfortably in up in recliner CVS: Pulse regular rhythm, normal rate, S1 and S2 with ejection systolic murmur Resp: Diminished breath sounds over bases, no rales/rhonchi Abd: Soft, obese, nontender Ext: Minimal lower extremity edema around ankles.  Left upper arm swelling noted (chronic)  Imaging: US RENAL  Result Date: 01/20/2019 CLINICAL DATA:  Renal failure, history of left nephrectomy EXAM: RENAL / URINARY TRACT ULTRASOUND COMPLETE COMPARISON:  01/11/2019 FINDINGS: Right Kidney: Renal measurements: 11.1 x 4.9 x 5.3 cm = volume: 152 mL . Echogenicity within normal limits. There is a 1.6 cm cyst at the upper pole. No new mass or hydronephrosis visualized. Left Kidney: Absent Bladder: Decompressed by Foley catheter. Other: None. IMPRESSION: No hydronephrosis. Electronically Signed   By: Macy Mis M.D.   On: 01/20/2019 10:22    Labs: BMET Recent  Labs  Lab 01/15/19 0234 01/15/19 1815 01/16/19 0227 01/17/19 0941 01/18/19 0304 01/19/19 0311 01/20/19 0227 01/21/19 0313  NA 141  140 142 146* 148* 147* 145 146* 146*  K 3.0*  3.0* 3.6 3.1* 4.1 3.7 3.4* 3.8 3.9  CL 101  99 100 107 107 105 102 107 108  CO2 29  30 27 30 28  32 30 25 28   GLUCOSE 72  73 105* 91 66*  82 68* 45* 73  BUN 38*  35* 34* 34* 33* 31* 24* 22 19  CREATININE 2.70*  2.74* 2.83* 2.84* 3.03* 3.00* 3.01* 3.16* 3.71*  CALCIUM 7.8*  7.8* 8.6* 8.2* 8.5* 8.3* 8.3* 7.9* 8.1*  PHOS 2.7  2.6 2.2* 2.1* 2.3* 2.4* 3.1  --   --    CBC Recent Labs  Lab 01/16/19 0227 01/17/19 0941 01/18/19 0304 01/19/19 0311 01/20/19 0227 01/21/19 0313  WBC 18.4* 15.3* 15.1* 10.8* 8.6 11.9*  NEUTROABS 15.3* 12.0* 11.7* 7.0  --   --   HGB 8.2* 8.8* 7.7* 7.4* 6.5* 7.7*  HCT 24.8* 28.7* 24.6* 23.7* 21.6* 24.4*  MCV 69.1* 73.4* 73.0* 72.0* 74.5* 76.0*  PLT 318 183 249 242 197 167    Medications:    . sodium chloride   Intravenous Once  . amLODipine  10 mg Oral Daily  . Chlorhexidine Gluconate Cloth  6 each Topical Daily  . darbepoetin (ARANESP) injection - NON-DIALYSIS  150 mcg Subcutaneous Q Fri-1800  . DULoxetine  60 mg Oral BID  . hydroxychloroquine  200 mg Oral BID  . levothyroxine  25 mcg Oral Q0600  . lidocaine  1 patch Transdermal Q24H  . magnesium oxide  400 mg Oral BID  . pantoprazole  40 mg Oral Daily  . phenytoin (DILANTIN) IV  100 mg Intravenous Q8H  . polyethylene glycol  17 g Oral BID  . predniSONE  30 mg Oral Q breakfast  . senna-docusate  1 tablet Oral BID   Elmarie Shiley, MD 01/21/2019, 2:02 PM

## 2019-01-21 NOTE — Progress Notes (Signed)
Physical Therapy Treatment Patient Details Name: Carolyn Zamora MRN: [id number] DOB: [birth year] Today's Date: 01/21/2019    History of Present Illness Pt is a 63 y/o F admitted on 01/10/19 for feet pain x 5 days & worsening renal function. Pt found to have sepsis of unknown etiology. Pt developed seizures. PMH significant for renal cell carcinoma s/p L nephrectomy, splenectomy, CKD stage 3, chronic pain syndrome, morbid obesity, lupus on chronic immunosuppressive therapy, HTN, anemia.    PT Comments    Pt much improved. Able to amb in room with supervision. Updated recommendations to home with [facility].    Follow Up Recommendations  Home health PT;Supervision - Intermittent     Equipment Recommendations  None recommended by PT    Recommendations for Other Services       Precautions / Restrictions Precautions Precautions: Fall Restrictions Weight Bearing Restrictions: No    Mobility  Bed Mobility               General bed mobility comments: Pt sitting EOB.  Transfers Overall transfer level: Needs assistance Equipment used: 4-wheeled walker;None Transfers: Sit to/from [facility] Sit to Stand: Supervision Stand pivot transfers: Supervision       General transfer comment: supervision for lines/safety. Stand pivot from bed to bsc to recliner  Ambulation/Gait Ambulation/Gait assistance: Supervision Gait Distance (Feet): 25 Feet Assistive device: 4-wheeled walker Gait Pattern/deviations: Step-through pattern;Decreased stride length;Trunk flexed Gait velocity: decr Gait velocity interpretation: <1.31 ft/sec, indicative of household ambulator General Gait Details: supervision for lines/safety   Stairs             Wheelchair Mobility    Modified Rankin (Stroke Patients Only)       Balance Overall balance assessment: Needs assistance Sitting-balance support: Feet supported Sitting balance-Leahy Scale: Fair     Standing balance  support: During functional activity;No upper extremity supported Standing balance-Leahy Scale: Fair                              Cognition Arousal/Alertness: Awake/alert Behavior During Therapy: WFL for tasks assessed/performed Overall Cognitive Status: Within Functional Limits for tasks assessed                                        Exercises      General Comments        Pertinent Vitals/Pain Pain Assessment: Faces Faces Pain Scale: Hurts a little bit Pain Location: bil feet Pain Descriptors / Indicators: Grimacing;Guarding Pain Intervention(s): Limited activity within patient's tolerance;Monitored during session;Repositioned    Home Living                      Prior Function            PT Goals (current goals can now be found in the care plan section) Acute Rehab PT Goals Patient Stated Goal: decreased pain PT Goal Formulation: With patient Time For Goal Achievement: 01/28/19 Potential to Achieve Goals: Good Progress towards PT goals: Goals met and updated - see care plan    Frequency    Min 3X/week      PT Plan Discharge plan needs to be updated    Co-evaluation              AM-PAC PT "6 Clicks" Mobility   Outcome Measure  Help needed turning from your back to your  side while in a flat bed without using bedrails?: None Help needed moving from lying on your back to sitting on the side of a flat bed without using bedrails?: A Little Help needed moving to and from a bed to a chair (including a wheelchair)?: A Little Help needed standing up from a chair using your arms (e.g., wheelchair or bedside chair)?: A Little Help needed to walk in hospital room?: A Little Help needed climbing 3-5 steps with a railing? : A Little 6 Click Score: 19    End of Session Equipment Utilized During Treatment: Gait belt Activity Tolerance: Patient tolerated treatment well Patient left: with call bell/phone within reach;in  chair;with chair alarm set Nurse Communication: Mobility status PT Visit Diagnosis: Other abnormalities of gait and mobility (R26.89);Muscle weakness (generalized) (M62.81);Pain Pain - Right/Left: (bilateral) Pain - part of body: Ankle and joints of foot     Time: 3612-2449 PT Time Calculation (min) (ACUTE ONLY): 25 min  Charges:  $Gait Training: 23-37 mins                     [place] Pager [phone] Office [place] 01/21/2019, 11:59 AM

## 2019-01-21 NOTE — TOC Progression Note (Addendum)
Transition of Care [facility]) - Progression Note    Patient Details  Name: Tiawanna Luchsinger MRN: [id number] Date of Birth: [birth year]  Transition of [facility]) CM/SW Contact  Sharin Mons, RN Phone Number: 01/21/2019, 12:27 PM  Clinical Narrative:    Admitted with sepsis 2/2UTI / worsening RF, hx of renal cell carcinoma s/p L nephrectomy, splenectomy, CKD stage 3, chronic pain syndrome, morbid obesity, lupus on chronic immunosuppressive therapy, HTN, anemia.  NCM spoke with pt @ bedside about d/c planning. Pt states she wants to go home and declines home health services. States resides with daughter Gae Bon [phone]). NCM confirmed with daughter pt will have 24/7 supervision/assistance from family once d/c. Supportive sister, Cannon Kettle [phone]).    Already has W/C, rolling walker, BSC @ home. Per pt has transportation to home when medically ready for d/c.  TOC team will continue to monitor for d/c needs.  Expected Discharge Plan: Home/Self Care(Pt declined home health services) Barriers to Discharge: Continued Medical Work up  Expected Discharge Plan and Services Expected Discharge Plan: Home/Self Care(Pt declined home health services) In-house Referral: Clinical Social Work Discharge Planning Services: CM Consult Post Acute Care Choice: NA Living arrangements for the past 2 months: Apartment                 DME Arranged: N/A         HH Arranged: Refused HH, Refused SNF [place] Agency: NA         Social Determinants of Health (SDOH) Interventions    Readmission Risk Interventions Readmission Risk Prevention Plan 01/13/2019  Transportation Screening Complete  PCP or Specialist Appt within 3-5 Days Complete  HRI or [place] Complete  Social Work Consult for [place] Planning/Counseling Complete  Palliative Care Screening Not Applicable  Medication Review [employer]) Referral to Pharmacy  Some recent data might be hidden

## 2019-01-21 NOTE — Progress Notes (Signed)
PROGRESS NOTE  Carolyn Zamora MRN:[id number] DOB: 02/09/1956 DOA: 01/10/2019 PCP: Gassemi, Mike, MD   LOS: 10 days   Brief narrative: Carolyn Zamorais a 63 y.o.femalewith medical history significant ofrenal cell carcinoma status post left nephrectomy, history of splenectomy, CKD stage III, chronic pain syndrome, morbid obesity, lupus-on chronic immunosuppressive therapy, hypertension, anemia of chronic disease was transferred [facility] for the concern of sepsis secondary to UTI and worsening kidney function. She also complained of the severe bilateral lower extremity pain with difficulty ambulation decreased appetite fatigue and lethargy.  X-ray of right foot obtained at [facility]-which came back negative.She lives alone at home and her daughter and sister helps her.   At [facility]: Patient was tachycardic, tachypneic, leukocytosis of 20.3, UA positive for small leukocytes. CK, lipase level: WNL, COVID-19 negative, magnesium: Low--was replaced. CMP showed worsening kidney function. Patient received IV fluids and Rocephin for the concern of urosepsis. Patient was then transferred to [facility] for further evaluation and management.  Assessment/Plan:  Principal Problem:   Sepsis (HCC) Active Problems:   Hypertension   Systemic lupus erythematosus (HCC)   Chronic pain syndrome   Renal cell carcinoma of left kidney s/p nephrectomy   Anemia of chronic disease   Renal failure (ARF), acute on chronic (HCC)   Pain in both feet   Hypomagnesemia  Sepsisof Unknown Etiology but likely in the setting of Pyelonephritis/urinary retention and hydronephrosis -Abnormal urinalysis, urine culture negative so far. MRSA PCR was negative. Off vancomycin.  On Rocephin IV since 01/16/2019.  Leukocytosis present but mild.  CT scan abdomen on 01/15/2019 reviewed which showed a small left pleural effusion mild right hydronephrosis and perinephric stranding.COVID-19 negative,  lipase within normal limits.  Patient does not have chronic leukocytosis although she is on prednisone at home.  Blood cultures 01/11/2019 and showed NGTD at 5 Days.   Bilateral Feet Pain with tophaceous gout. Duplex ultrasound was negative for DVT. ,History of gout with LDH was 238, Uric Acid was 11.5, and ESR was <4;  Avoid NSAIDs due to underlying renal failure; on prednisone 30 mg now.  We will continue to gradually taper down.  On Norco and duloxetine.  Repeat Uric Acid Level was 10.3  Mild hypomagnesemia.  Will closely monitor.  Magnesium of 1.5 on 01/20/2019. on oral magnesium.  We will continue to monitor  Mild hypokalemia.  Replenished.  Potassium 3.9 today.  AKI on CKD stage IV Metabolic Acidosis, improved Likely secondary to sepsis. was on IV Sodium Bicarbonate and then D5W;  creatinine today of 3.7 uptrend from from 3.1.  Spoke with nephrology Dr. Patel about it.  Due to acute rise in creatinine nephrology has started the patient on D5 half-normal saline at 100 mL/h.  Renal labs have been sent.  Check BMP again.  Patient has history of left nephrectomy due to renal cell carcinoma. Repeat CT Scan showed mild Hydronephrosis.  Continue to hold Lasix.  Nephrology on board.  Urology was consulted about perinephric stranding and hydronephrosis.  Urology recommended follow-up renal ultrasound which did not show evidence of hydronephrosis and decompressed urinary bladder.  Urology did not recommend any further intervention.  Continue Foley catheter for 1 more day since patient had acute elevation of creatinine.  Consider discontinuation by tomorrow if the creatinine improves.  Microcytic Anemia/Anemia of Chronic Kidney Disease  Received darbepoetin alfa 150 mcg subcu ,No signs of active bleeding, monitor H&H closely.  Hemoglobin is 7.7 today from 6.5 yesterday.  No obvious bleeding reported.  Received   1 unit of packed RBC.  Check CBC in a.m.  Lupus, currently not in flare -Mycophenolate on  hold due to somnolence, now improved will restart.   Prednisone taper down to 5 mg in the coming weeks if she is able to tolerate p.o. Continue hydroxychloroquine 200 mg po BID.    hypertension  -Has been resumed on amlodipine.  Latest blood pressure of 132/89  Depression/Anxiety Continue duloxetine.  Continue to hold trazodone for now.  GERD On Pantoprazole -continue  Chronic Pain Syndrome Continue Norco, duloxetine.    Off tizanidine and Lyrica.    Obesity Lifestyle modification advised  Left Arm Swelling From IV infiltration.  Supportive care  Hypernatremia Been started on D5 half-normal saline.  Sodium level of 146 gain today.   Acute Urinary Retention -Seen by [facility], urology on board for mild hydronephrosis.  On a Foley catheter.  Ultrasound without hydronephrosis.  No further management plan as per urology.  Consider discontinuation of urinary catheter if her renal function improves and at baseline.  Metabolic encephalopathy  -Improving.  Could be subclinical seizures.  Ammonia Level was 14.  CT head scan was negative for acute findings.  EEG showed severe encephalopathy, nonspecific finding. Neurology on board and recommend treating metabolic issues.  Long-term EEG without seizures.  Confusion and Seizure Like Activity with witnessed Tonic-Clonic Seizures x2  Neurology on board and had long-term EEG..  Patient was loaded with IV Fosphenytoin 1500 mg and continued on Dilantin.  Keppra is not a good choice given her impaired renal function.    Phenytoin level of 9.5 borderline low.  Neurology now recommending Dilantin 300 mg at nighttime.  VTE Prophylaxis:  Resume heparin subq  Code Status: Full code  Family Communication: I spoke with the patient's sister Ms.Aquilla on the phone and updated her about the clinical condition of the patient and the plan for disposition home tomorrow if improved  Disposition Plan: Home health PT likely in 1 to 2 days, with  improvement in renal function.  DC Foley catheter trial by a.m if okay with nephrology..  Home health  orders have been placed.  Follow nephrology recommendations.  Consultants:  Neurology,   nephrology,   [facility]  Procedures:  Foley catheter  Long-term EEG  Antibiotics: Rocephin IV  Anti-infectives (From admission, onward)   Start     Dose/Rate Route Frequency Ordered Stop   01/16/19 1630  cefTRIAXone (ROCEPHIN) 1 g in sodium chloride 0.9 % 100 mL IVPB     1 g 200 mL/hr over 30 Minutes Intravenous Every 24 hours 01/16/19 1608     01/14/19 0600  vancomycin (VANCOCIN) IVPB 1000 mg/200 mL premix  Status:  Discontinued     1,000 mg 200 mL/hr over 60 Minutes Intravenous Every 48 hours 01/12/19 1041 01/15/19 0816   01/12/19 0445  vancomycin (VANCOCIN) 1,500 mg in sodium chloride 0.9 % 500 mL IVPB     1,500 mg 250 mL/hr over 120 Minutes Intravenous NOW 01/12/19 0426 01/12/19 0750   01/11/19 2200  hydroxychloroquine (PLAQUENIL) tablet 200 mg     200 mg Oral 2 times daily 01/11/19 2105     01/11/19 1830  vancomycin (VANCOCIN) 1,500 mg in sodium chloride 0.9 % 500 mL IVPB  Status:  Discontinued     1,500 mg 250 mL/hr over 120 Minutes Intravenous  Once 01/11/19 1817 01/12/19 0426   01/11/19 1830  ceFEPIme (MAXIPIME) 2 g in sodium chloride 0.9 % 100 mL IVPB  Status:  Discontinued       2 g 200 mL/hr over 30 Minutes Intravenous Every 24 hours 01/11/19 1817 01/16/19 0928   01/11/19 1818  vancomycin variable dose per unstable renal function (pharmacist dosing)  Status:  Discontinued      Does not apply See admin instructions 01/11/19 1818 01/15/19 0816   01/10/19 1845  cefTRIAXone (ROCEPHIN) 1 g in sodium chloride 0.9 % 100 mL IVPB     1 g 200 mL/hr over 30 Minutes Intravenous  Once 01/10/19 1835 01/10/19 2004     Subjective:  Today, patient feels okay.  Denies any dizziness, lightheadedness, shortness of breath, cough or fever.  Mildly decreased urinary output.  No nausea  vomiting or abdominal pain.  Objective: Vitals:   01/21/19 0756 01/21/19 1148  BP: (!) 149/75 132/89  Pulse: 96 99  Resp: 20 (!) 22  Temp: 98.1 F (36.7 C) 98.3 F (36.8 C)  SpO2: 100%     Intake/Output Summary (Last 24 hours) at 01/21/2019 1504 Last data filed at 01/20/2019 1700 Gross per 24 hour  Intake 315 ml  Output 350 ml  Net -35 ml   Filed Weights   01/15/19 0500 01/16/19 0507 01/17/19 0343  Weight: 87.1 kg 84.5 kg 83.4 kg   Body mass index is 33.63 kg/m.   Physical Exam: GENERAL: Patient is obese, not in obvious distress, alert awake and communicative. HENT: No scleral pallor or icterus. Pupils equally reactive to light. Oral mucosa is moist, NECK: is supple, no palpable thyroid enlargement. CHEST: Diminished breath sounds bilaterally.  No obvious crackles or wheezes noted. CVS: S1 and S2 heard, no murmur. Regular rate and rhythm. No pericardial rub. ABDOMEN: Soft, non-tender, bowel sounds are present.  Foley catheter in place No palpable hepato-splenomegaly. EXTREMITIES: No edema.  Lower legs with discoloration PSYCH: Alert awake communicative.  Oriented to time place and person.  Could not answer the president. CNS: Cranial nerves are intact. No focal motor or sensory deficits. SKIN: Skin changes and discoloration from lupus  Data Review: I have personally reviewed the following laboratory data and studies,  CBC: Recent Labs  Lab 01/15/19 1815 01/16/19 0227 01/17/19 0941 01/18/19 0304 01/19/19 0311 01/20/19 0227 01/21/19 0313  WBC 13.4* 18.4* 15.3* 15.1* 10.8* 8.6 11.9*  NEUTROABS 12.3* 15.3* 12.0* 11.7* 7.0  --   --   HGB 9.1* 8.2* 8.8* 7.7* 7.4* 6.5* 7.7*  HCT 28.2* 24.8* 28.7* 24.6* 23.7* 21.6* 24.4*  MCV 70.0* 69.1* 73.4* 73.0* 72.0* 74.5* 76.0*  PLT 371 318 183 249 242 197 130   Basic Metabolic Panel: Recent Labs  Lab 01/15/19 1815 01/16/19 0227 01/17/19 0941 01/18/19 0304 01/19/19 0311 01/20/19 0227 01/21/19 0313  NA 142 146* 148*  147* 145 146* 146*  K 3.6 3.1* 4.1 3.7 3.4* 3.8 3.9  CL 100 107 107 105 102 107 108  CO2 _0 32 _1 GLUCOSE 105* 91 66* 82 68* 45* 73  BUN 34* 34* 33* 31* 24* 22 19  CREATININE 2.83* 2.84* 3.03* 3.00* 3.01* 3.16* 3.71*  CALCIUM 8.6* 8.2* 8.5* 8.3* 8.3* 7.9* 8.1*  MG 2.3 2.0 1.8 1.6* 1.6* 1.5*  --   PHOS 2.2* 2.1* 2.3* 2.4* 3.1  --   --    Liver Function Tests: Recent Labs  Lab 01/15/19 1815 01/16/19 0227 01/17/19 0941 01/18/19 0304 01/19/19 0311  AST _2 36  ALT _3 ALKPHOS 93 76 83 75 75  BILITOT 0.9 0.7 0.9 1.0 0.9  PROT 6.4* 5.3* 5.3*  4.9* 4.8*  ALBUMIN 2.9* 2.4* 2.5* 2.4* 2.5*   No results for input(s): LIPASE, AMYLASE in the last 168 hours. Recent Labs  Lab 01/17/19 1146  AMMONIA 14   Cardiac Enzymes: No results for input(s): CKTOTAL, CKMB, CKMBINDEX, TROPONINI in the last 168 hours. BNP (last 3 results) No results for input(s): BNP in the last 8760 hours.  ProBNP (last 3 results) No results for input(s): PROBNP in the last 8760 hours.  CBG: Recent Labs  Lab 01/16/19 0455  GLUCAP 80   Recent Results (from the past 240 hour(s))  Culture, blood (routine x 2)     Status: None   Collection Time: 01/11/19  6:02 PM   Specimen: BLOOD RIGHT HAND  Result Value Ref Range Status   Specimen Description BLOOD RIGHT HAND  Final   Special Requests   Final    BOTTLES DRAWN AEROBIC ONLY Blood Culture results may not be optimal due to an inadequate volume of blood received in culture bottles   Culture   Final    NO GROWTH 5 DAYS Performed at [facility], [place] [address], [place], [place] [address]    Report Status 01/16/2019 FINAL  Final  Culture, blood (routine x 2)     Status: None   Collection Time: 01/11/19  6:07 PM   Specimen: BLOOD LEFT HAND  Result Value Ref Range Status   Specimen Description BLOOD LEFT HAND  Final   Special Requests   Final    BOTTLES DRAWN AEROBIC ONLY Blood Culture results may not be optimal due to an  inadequate volume of blood received in culture bottles   Culture   Final    NO GROWTH 5 DAYS Performed at [facility], [place] [address], [place], [place] [address]    Report Status 01/16/2019 FINAL  Final  Urine culture     Status: None   Collection Time: 01/12/19  5:10 PM   Specimen: Urine, Clean Catch  Result Value Ref Range Status   Specimen Description URINE, CLEAN CATCH  Final   Special Requests Immunocompromised  Final   Culture   Final    NO GROWTH Performed at [facility], [place] [address] [place], [place] [address]    Report Status 01/13/2019 FINAL  Final  MRSA PCR Screening     Status: None   Collection Time: 01/12/19 11:05 PM   Specimen: Nasopharyngeal  Result Value Ref Range Status   MRSA by PCR NEGATIVE NEGATIVE Final    Comment:        The GeneXpert MRSA Assay (FDA approved for NASAL specimens only), is one component of a comprehensive MRSA colonization surveillance program. It is not intended to diagnose MRSA infection nor to guide or monitor treatment for MRSA infections. Performed at [facility], [place] [address], [place], [place] [address]   Culture, Urine     Status: None   Collection Time: 01/16/19  5:57 PM   Specimen: Urine, Random  Result Value Ref Range Status   Specimen Description URINE, RANDOM  Final   Special Requests NONE  Final   Culture   Final    NO GROWTH Performed at [facility], [place] [address] [place], [place] [address]    Report Status 01/18/2019 FINAL  Final     Studies: US RENAL  Result Date: 01/20/2019 CLINICAL DATA:  Renal failure, history of left nephrectomy EXAM: RENAL / URINARY TRACT ULTRASOUND COMPLETE COMPARISON:  01/11/2019 FINDINGS: Right Kidney: Renal measurements:  11.1 x 4.9 x 5.3 cm = volume: 152 mL . Echogenicity within normal limits. There is a 1.6 cm cyst at the upper pole. No new mass or hydronephrosis visualized. Left Kidney: Absent Bladder: Decompressed by Foley catheter. Other: None.  IMPRESSION: No hydronephrosis. Electronically Signed   By: Macy Mis M.D.   On: 01/20/2019 10:22    Scheduled Meds: . sodium chloride   Intravenous Once  . amLODipine  10 mg Oral Daily  . Chlorhexidine Gluconate Cloth  6 each Topical Daily  . darbepoetin (ARANESP) injection - NON-DIALYSIS  150 mcg Subcutaneous Q Fri-1800  . DULoxetine  60 mg Oral BID  . hydroxychloroquine  200 mg Oral BID  . levothyroxine  25 mcg Oral Q0600  . lidocaine  1 patch Transdermal Q24H  . magnesium oxide  400 mg Oral BID  . pantoprazole  40 mg Oral Daily  . phenytoin (DILANTIN) IV  100 mg Intravenous Q8H  . polyethylene glycol  17 g Oral BID  . predniSONE  30 mg Oral Q breakfast  . senna-docusate  1 tablet Oral BID    Continuous Infusions: . cefTRIAXone (ROCEPHIN)  IV 1 g (01/20/19 1814)  . dextrose 5 % and 0.45% NaCl       Flora Lipps, MD  [facility] 01/21/2019

## 2019-01-22 LAB — BASIC METABOLIC PANEL
Anion gap: 11 (ref 5–15)
BUN: 16 mg/dL (ref 8–23)
CO2: 26 mmol/L (ref 22–32)
Calcium: 7.9 mg/dL — ABNORMAL LOW (ref 8.9–10.3)
Chloride: 105 mmol/L (ref 98–111)
Creatinine, Ser: 3.75 mg/dL — ABNORMAL HIGH (ref 0.44–1.00)
GFR calc Af Amer: 14 mL/min — ABNORMAL LOW (ref >60)
GFR calc non Af Amer: 12 mL/min — ABNORMAL LOW (ref >60)
Glucose, Bld: 85 mg/dL (ref 70–99)
Potassium: 3.4 mmol/L — ABNORMAL LOW (ref 3.5–5.1)
Sodium: 142 mmol/L (ref 135–145)

## 2019-01-22 LAB — CBC
HCT: 23 % — ABNORMAL LOW (ref 36.0–46.0)
Hemoglobin: 7.2 g/dL — ABNORMAL LOW (ref 12.0–15.0)
MCH: 24 pg — ABNORMAL LOW (ref 26.0–34.0)
MCHC: 31.3 g/dL (ref 30.0–36.0)
MCV: 76.7 fL — ABNORMAL LOW (ref 80.0–100.0)
Platelets: 176 10*3/uL (ref 150–400)
RBC: 3 MIL/uL — ABNORMAL LOW (ref 3.87–5.11)
RDW: 18.2 % — ABNORMAL HIGH (ref 11.5–15.5)
WBC: 10.4 10*3/uL (ref 4.0–10.5)
nRBC: 18.8 % — ABNORMAL HIGH (ref 0.0–0.2)

## 2019-01-22 LAB — UREA NITROGEN, URINE: Urea Nitrogen, Ur: 346 mg/dL

## 2019-01-22 LAB — MAGNESIUM: Magnesium: 1.4 mg/dL — ABNORMAL LOW (ref 1.7–2.4)

## 2019-01-22 MED ORDER — PREDNISONE 20 MG PO TABS
20.0000 mg | ORAL_TABLET | Freq: Every day | ORAL | Status: DC
  Administered 2019-01-22 – 2019-01-23 (×2): 20 mg via ORAL
  Filled 2019-01-22 (×2): qty 1

## 2019-01-22 NOTE — Progress Notes (Signed)
Pt no longer has pain at IV site. No redness or swelling noted. IV team still attempting new IV site.

## 2019-01-22 NOTE — Progress Notes (Signed)
0528 Pt complains of arm pain at IV site. Pt's IVF discontinued. As pt rec'd Dilantin IV at 0518 and IV consult was placed and IV team was paged.  0543 Rec'd call back from IV team, and advised to place ice on site. Immediately placed ice on IV site.

## 2019-01-22 NOTE — Progress Notes (Signed)
[facility]                                                                              Patient Demographics  Carolyn Zamora, is a 63 y.o. female, DOB - 11-04-1955, OIN:[id number]  Admit date - 01/10/2019   Admitting Physician Lenore Cordia, MD  Outpatient Primary MD for the patient is Beckie Salts, MD  Outpatient specialists:   LOS - 11  days    Chief Complaint  Patient presents with  . Foot Pain       Brief summary   Carolyn Zamora is a 63 y.o. female with  Multiple complicated medical history including but not limited to renal cell carcinoma status post left nephrectomy, history of splenectomy, CKD stage III, chronic pain syndrome, lupus-on chronic immunosuppressive therapy,  and history of gout presenting with  Sepsis of urinary source and worsening renal function.  She also complained of  feet pain, and had been confused.    Assessment & Plan    Principal Problem:   Sepsis ([place]) Active Problems:   Hypertension   Systemic lupus erythematosus (HCC)   Chronic pain syndrome   Renal cell carcinoma of left kidney s/p nephrectomy   Anemia of chronic disease   Renal failure (ARF), acute on chronic (HCC)   Pain in both feet   Hypomagnesemia   Sepsis Leukocytosis, tachy on presentation  Urinary source Abnormal urinalysis, urine culture negative. MRSA PCR was negative.  CT scan abdomen,01/15/2019 -small left pleural effusion mild right hydronephrosis and perinephric stranding COVID-19 negative, lipase within normal limits.  Blood cultures 01/11/2019 - NGTD at 5 Days.  Off vancomycin - Rocephin IV since 01/16/2019  AKI on CKD stage IV Metabolic Acidosis, improved Likely secondary to sepsis. was on IV Sodium Bicarbonate and then D5W; creatinine uptrend 01/21/2019 to 3.7 from 3.1.  exact etiology unclear but  Nephrology consultant suspect ATN and recommend IV fluid overnight with discontinuation of Foley catheter today 01/22/2019, and if able  to void with down trend of renal function done discharged tomorrow 01/23/2019.  Metabolic encephalopathy  Head CT negative Ammonia level 14 ? Subclinical seizures  Improving  Confusion and Seizure Like Activitywith witnessed Tonic-ClonicSeizures x2 Neurology  Consulted EEG showed severe encephalopathy, nonspecific finding Patient was loaded with IV Fosphenytoin 1500 mg and continued on Dilantin.  Keppra is not a good choice given her impaired renal function.   Bilateral Feet Pain with tophaceous gout. Duplex ultrasound was negative for DVT.  History of gout with LDH was 238, Uric Acid was 11.5, and ESR was <4;   Avoid NSAIDs due to underlying renal failure- steroids for now -Taper as needed  Supportive pain control  Mild hypomagnesemia.   Magnesium of 1.5 on 01/20/2019.   continue oral magnesium, follow  Mild hypokalemia.   Replenished      Microcytic Anemia/Anemia of Chronic Kidney Disease  No signs of active bleeding, monitor H&H closely.   Hemoglobin is 7.7 today from 6.5 yesterday.   Status post 1 unit PRBC transfusion.  Lupus, currently not in flare -Mycophenolate on hold due to somnolence, now improved will restart.   Prednisone taper down to 5  mg in the coming weeks if she is able to tolerate p.o. Continue hydroxychloroquine 200 mg po BID.    hypertension  cont bp meds  Depression/Anxiety Continue duloxetine.  Continue to hold trazodone for now.  GERD On Pantoprazole -continue  Chronic Pain Syndrome Continue Norco, duloxetine.    Off tizanidine and Lyrica.    Obesity Lifestyle modification advised  Left Arm Swelling From IV infiltration.  Supportive care  Hypernatremia Been started on D5 half-normal saline.  Sodium level of 146 gain today.   Acute Urinary Retention -Seen by [facility], urology on board for mild hydronephrosis.  On a Foley catheter.  Ultrasound without hydronephrosis.  No further management plan as per urology.   Consider discontinuation of urinary catheter if her renal function improves and at baseline.                           Code Status: full code DVT Prophylaxis:  heparin  Family Communication: Discussed in detail with the patient, all imaging results, lab results explained to the patient    Disposition Plan: home  Time Spent in minutes  22 minutes  Procedures:   Foley catheter  Long-term EEG  Consultants:    Neurology,   nephrology,   [facility]  Antimicrobials:  Rocephin IV   Medications  Scheduled Meds: . sodium chloride   Intravenous Once  . amLODipine  10 mg Oral Daily  . Chlorhexidine Gluconate Cloth  6 each Topical Daily  . darbepoetin (ARANESP) injection - NON-DIALYSIS  150 mcg Subcutaneous Q Fri-1800  . DULoxetine  60 mg Oral BID  . heparin injection (subcutaneous)  5,000 Units Subcutaneous Q8H  . hydroxychloroquine  200 mg Oral BID  . levothyroxine  25 mcg Oral Q0600  . lidocaine  1 patch Transdermal Q24H  . magnesium oxide  400 mg Oral BID  . mycophenolate  500 mg Oral q morning - 10a  . pantoprazole  40 mg Oral Daily  . phenytoin (DILANTIN) IV  100 mg Intravenous Q8H  . polyethylene glycol  17 g Oral BID  . predniSONE  20 mg Oral Q breakfast  . senna-docusate  1 tablet Oral BID   Continuous Infusions: . cefTRIAXone (ROCEPHIN)  IV 1 g (01/21/19 1602)   PRN Meds:.acetaminophen **OR** acetaminophen, acetaminophen, dicyclomine, diphenhydrAMINE, HYDROcodone-acetaminophen, HYDROmorphone (DILAUDID) injection, ondansetron **OR** ondansetron (ZOFRAN) IV, prochlorperazine   Antibiotics   Anti-infectives (From admission, onward)   Start     Dose/Rate Route Frequency Ordered Stop   01/16/19 1630  cefTRIAXone (ROCEPHIN) 1 g in sodium chloride 0.9 % 100 mL IVPB     1 g 200 mL/hr over 30 Minutes Intravenous Every 24 hours 01/16/19 1608     01/14/19 0600  vancomycin (VANCOCIN) IVPB 1000 mg/200 mL premix  Status:  Discontinued      1,000 mg 200 mL/hr over 60 Minutes Intravenous Every 48 hours 01/12/19 1041 01/15/19 0816   01/12/19 0445  vancomycin (VANCOCIN) 1,500 mg in sodium chloride 0.9 % 500 mL IVPB     1,500 mg 250 mL/hr over 120 Minutes Intravenous NOW 01/12/19 0426 01/12/19 0750   01/11/19 2200  hydroxychloroquine (PLAQUENIL) tablet 200 mg     200 mg Oral 2 times daily 01/11/19 2105     01/11/19 1830  vancomycin (VANCOCIN) 1,500 mg in sodium chloride 0.9 % 500 mL IVPB  Status:  Discontinued     1,500 mg 250 mL/hr over 120 Minutes Intravenous  Once 01/11/19 1817 01/12/19 0426  01/11/19 1830  ceFEPIme (MAXIPIME) 2 g in sodium chloride 0.9 % 100 mL IVPB  Status:  Discontinued     2 g 200 mL/hr over 30 Minutes Intravenous Every 24 hours 01/11/19 1817 01/16/19 0928   01/11/19 1818  vancomycin variable dose per unstable renal function (pharmacist dosing)  Status:  Discontinued      Does not apply See admin instructions 01/11/19 1818 01/15/19 0816   01/10/19 1845  cefTRIAXone (ROCEPHIN) 1 g in sodium chloride 0.9 % 100 mL IVPB     1 g 200 mL/hr over 30 Minutes Intravenous  Once 01/10/19 1835 01/10/19 2004        Subjective:   Riki Berninger was seen and examined today. Feeling weell, wants to go home - advised to wait one more day  Objective:   Vitals:   01/21/19 2300 01/22/19 0404 01/22/19 0837 01/22/19 1219  BP: 120/79 (!) 139/59 (!) 155/82 (!) 143/74  Pulse: 97 95 100   Resp: '18 18 20 ' (!) 22  Temp: 99 F (37.2 C) 97.8 F (36.6 C) 98.8 F (37.1 C) 98.9 F (37.2 C)  TempSrc: Oral Oral Oral Oral  SpO2:   100%   Weight:  80.4 kg    Height:        Intake/Output Summary (Last 24 hours) at 01/22/2019 1222 Last data filed at 01/22/2019 6384 Gross per 24 hour  Intake 1250.72 ml  Output 800 ml  Net 450.72 ml     Wt Readings from Last 3 Encounters:  01/22/19 80.4 kg  01/25/18 77.1 kg  12/17/17 82.6 kg     Exam  General: NAD  HEENT: NCAT,  PERRL,MMM  Neck: SUPPLE, (-)  JVD  Cardiovascular: RRR, (-) GALLOP, (-) MURMUR  Respiratory: CTA  Gastrointestinal: SOFT, (-) DISTENSION, BS(+), (_) TENDERNESS  Ext: (-) CYANOSIS, (-) EDEMA  Neuro: A, OX 3  Skin:(-) RASH  Psych:NORMAL AFFECT/MOOD   Data Reviewed:  I have personally reviewed following labs and imaging studies  Micro Results Recent Results (from the past 240 hour(s))  Urine culture     Status: None   Collection Time: 01/12/19  5:10 PM   Specimen: Urine, Clean Catch  Result Value Ref Range Status   Specimen Description URINE, CLEAN CATCH  Final   Special Requests Immunocompromised  Final   Culture   Final    NO GROWTH Performed at [facility], [address], [place], [place] [address]    Report Status 01/13/2019 FINAL  Final  MRSA PCR Screening     Status: None   Collection Time: 01/12/19 11:05 PM   Specimen: Nasopharyngeal  Result Value Ref Range Status   MRSA by PCR NEGATIVE NEGATIVE Final    Comment:        The GeneXpert MRSA Assay (FDA approved for NASAL specimens only), is one component of a comprehensive MRSA colonization surveillance program. It is not intended to diagnose MRSA infection nor to guide or monitor treatment for MRSA infections. Performed at [facility], [place] [address], [place], [place] [address]   Culture, Urine     Status: None   Collection Time: 01/16/19  5:57 PM   Specimen: Urine, Random  Result Value Ref Range Status   Specimen Description URINE, RANDOM  Final   Special Requests NONE  Final   Culture   Final    NO GROWTH Performed at [facility], [place] [address] [place], [place] [address]    Report Status 01/18/2019 FINAL  Final  Radiology Reports EEG  Result Date: 01/16/2019 Alexis Goodell, MD     01/16/2019  4:06 PM ELECTROENCEPHALOGRAM REPORT Patient: Norita Meigs       Room #: 0X32T EEG No. ID: [id number] Age: 63 y.o.        Sex: female Referring Physician: Alfredia Ferguson Report Date:  01/16/2019       Interpreting  Physician: Alexis Goodell History: Wade Sigala is an 63 y.o. female with sepsis Medications: Aranesp, Cymbalta, Plaquenil, Synthroid, Deltasone Conditions of Recording:  This is a 21 channel routine scalp EEG performed with bipolar and monopolar montages arranged in accordance to the international 10/20 system of electrode placement. One channel was dedicated to EKG recording. The patient is in the altered state. Description:  The background activity is slow and disorganized.  This slow and poorly organized background is diffusely distributed.  There are also frequent diffusely distributed periodic discharges of triphasic morphology seen throughout the recording.  Hyperventilation and intermittent photic stimulation were not performed. IMPRESSION: This is a markedly abnormal electroencephalogram secondary to a slow and disorganized background with frequent diffusely distributed triphasic waves.  This finding is consistent with a severe encephalopathy, etiologically nonspecific.  Alexis Goodell, MD Neurology [phone] 01/16/2019, 4:00 PM   CT ABDOMEN PELVIS WO CONTRAST  Result Date: 01/15/2019 CLINICAL DATA:  63 year old female with abdominal distention, nausea vomiting. EXAM: CT ABDOMEN AND PELVIS WITHOUT CONTRAST TECHNIQUE: Multidetector CT imaging of the abdomen and pelvis was performed following the standard protocol without IV contrast. COMPARISON:  CT abdomen pelvis dated 01/12/2019. FINDINGS: Evaluation of this exam is limited in the absence of intravenous contrast. Evaluation is also limited due to streak artifact caused by patient's arms. Lower chest: Partially visualized small left pleural effusion with left lung base partial atelectasis versus infiltrate. Clinical correlation is recommended. There is a 7 mm subpleural nodule at the right lung base (series 5 image 28) similar to the study of 2019. There is hypoattenuation of the cardiac blood pool suggestive of a degree of anemia. Clinical  correlation is recommended. No intra-abdominal free air or free fluid. Hepatobiliary: The liver is grossly unremarkable. Minimal layering sludge may be present within the gallbladder. No pericholecystic fluid or evidence of acute cholecystitis by CT. Pancreas: The pancreas is mildly atrophic. No active inflammatory changes. 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 Adrenals/Urinary Tract: Status post prior left nephrectomy. There is mild right hydronephrosis. Right perinephric stranding noted. Correlation with urinalysis recommended to exclude UTI. No obstructing stone identified. The urinary bladder is grossly unremarkable. Stomach/Bowel: There is no bowel obstruction or active inflammation. The appendix is normal. Vascular/Lymphatic: Moderate aortoiliac atherosclerotic disease. The IVC is grossly unremarkable. No portal venous gas. There is no adenopathy. Reproductive: Probable partially calcified exophytic fibroid from the uterine fundus. No adnexal masses. Other: Mild diffuse subcutaneous edema. No drainable fluid collection. Musculoskeletal: Avascular necrosis of the femoral heads bilaterally. There is irregularity of the right femoral head consistent with subcortical collapse, chronic. Severe degenerative changes at L4-L5 and L5-S1. There is grade 2 L4-L5 anterolisthesis and grade 1 L5-S1 retrolisthesis. There is associated moderate narrowing of the central canal at this level. No acute osseous pathology. IMPRESSION: 1. Small left pleural effusion with left lung base partial atelectasis versus infiltrate. Clinical correlation is recommended. 2. Mild right hydronephrosis, new since the prior CT. There is right perinephric stranding. Correlation with urinalysis recommended to exclude UTI. 3. No bowel obstruction or active inflammation. Normal appendix. 4. Aortic Atherosclerosis (ICD10-I70.0). 5. Lower lumbar listhesis and degenerative changes  with associated narrowing of the  central canal noted Electronically Signed   By: Anner Crete M.D.   On: 01/15/2019 22:48   CT ABDOMEN PELVIS WO CONTRAST  Result Date: 01/12/2019 CLINICAL DATA:  Hydronephrosis, history of CKD EXAM: CT ABDOMEN AND PELVIS WITHOUT CONTRAST TECHNIQUE: Multidetector CT imaging of the abdomen and pelvis was performed following the standard protocol without IV contrast. COMPARISON:  Renal ultrasound January 11, 2019 CT October 22, 2018 FINDINGS: Lower chest: The visualized heart size within normal limits. No pericardial fluid/thickening. No hiatal hernia. There is patchy area of posterior consolidation seen at both lung bases. Hepatobiliary: Although limited due to the lack of intravenous contrast, normal in appearance without gross focal abnormality. Mildly dilated fluid-filled gallbladder. No calcified gallstones. Pancreas:  Unremarkable.  No surrounding inflammatory changes. Spleen: A small amount of splenic tissue seen within the left upper quadrant. Adrenals/Urinary Tract: Both adrenal glands appear normal. Again noted is a left-sided adrenal nephrectomy. There is mild right-sided perinephric stranding and proximal periureteral stranding. No right-sided renal, collecting system, or bladder calculi however are noted. Stomach/Bowel: The stomach, small bowel, and colon are normal in appearance. No inflammatory changes or obstructive findings. Vascular/Lymphatic: There are no enlarged abdominal or pelvic lymph nodes. Scattered aortic atherosclerotic calcifications are seen without aneurysmal dilatation. Reproductive: Again noted is a partially exophytic right posterior fibroid with coarse calcifications. Other: No evidence of abdominal wall mass or hernia. Musculoskeletal: No acute or significant osseous findings. Again noted is a grade 2 anterolisthesis of L4 on L5 with endplate irregularity and collapse of the anterior superior endplate of L5, and also fragmentation of the posteroinferior L4 vertebral body.  This is seen dating back to MRI of 2019. 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 IMPRESSION: 1. Patchy airspace opacity seen at both lung bases which is non-specific could be related to early infectious etiology and/or atelectasis. 2. Mild inflammatory changes surrounding the right kidney which could be due to pyelonephritis. No right-sided renal or collecting system calculi. 3.  Aortic Atherosclerosis (ICD10-I70.0). Electronically Signed   By: Prudencio Pair M.D.   On: 01/12/2019 02:14   DG Abd 1 View  Result Date: 01/15/2019 CLINICAL DATA:  Abdominal pain. EXAM: ABDOMEN - 1 VIEW COMPARISON:  CT scan January 15, 2019 FINDINGS: The lung bases are unremarkable. No free air, portal venous gas, or pneumatosis. Contrast is seen throughout the colon. No evidence of bowel obstruction. No other acute abnormalities identified. IMPRESSION: No acute abnormality seen.  No evidence of bowel obstruction. Electronically Signed   By: Dorise Bullion III M.D   On: 01/15/2019 22:58   CT HEAD WO CONTRAST  Result Date: 01/16/2019 CLINICAL DATA:  Unexplained altered level of consciousness. EXAM: CT HEAD WITHOUT CONTRAST TECHNIQUE: Contiguous axial images were obtained from the base of the skull through the vertex without intravenous contrast. COMPARISON:  11/05/2015 FINDINGS: Brain: Some motion degradation. Mild atrophy as seen previously. No sign of acute infarction, mass lesion, hemorrhage, hydrocephalus or extra-axial collection. Vascular: There is atherosclerotic calcification of the major vessels at the base of the brain. Skull: Negative Sinuses/Orbits: Clear/normal Other: None IMPRESSION: No acute finding.  Mild atrophy. Electronically Signed   By: Nelson Chimes M.D.   On: 01/16/2019 13:42   MR BRAIN WO CONTRAST  Result Date: 01/16/2019 CLINICAL DATA:  63 year old female with unexplained altered mental status. History of renal cell carcinoma status post nephrectomy, and lupus on chronic immunosuppressive  therapy. EXAM: MRI HEAD WITHOUT CONTRAST TECHNIQUE: Multiplanar, multiecho pulse sequences of  the brain and surrounding structures were obtained without intravenous contrast. COMPARISON:  Head CT earlier today. [facility]. Brain MRI 07/27/2014 FINDINGS: Brain: Generalized cerebral volume loss since 2016, mild-to-moderate. No restricted diffusion to suggest acute infarction. No midline shift, mass effect, evidence of mass lesion, ventriculomegaly, extra-axial collection or acute intracranial hemorrhage. Cervicomedullary junction and pituitary are within normal limits. Scattered mostly subcortical white matter T2 and FLAIR hyperintensity is not significantly changed since 2016. No superimposed cortical encephalomalacia or chronic cerebral blood products. Deep gray matter nuclei, brainstem and cerebellum appear negative. Vascular: Major intracranial vascular flow voids are stable since 2016. Chronic generalized intracranial artery tortuosity. Skull and upper cervical spine: Normal visible cervical spine. Visualized bone marrow signal is within normal limits. Sinuses/Orbits: Negative orbits. Trace paranasal sinus mucosal thickening is stable. Other: Trace left mastoid effusion Right mastoids has regressed remain clear. Negative visible internal auditory structures. Scalp and face soft tissues appear negative. IMPRESSION: 1. No acute intracranial abnormality. 2. Stable white matter signal changes since 2016, nonspecific but most commonly due to chronic small vessel disease. 3. Mild to moderate generalized cerebral volume loss since 2006. Electronically Signed   By: Genevie Ann M.D.   On: 01/16/2019 14:50   US RENAL  Result Date: 01/20/2019 CLINICAL DATA:  Renal failure, history of left nephrectomy EXAM: RENAL / URINARY TRACT ULTRASOUND COMPLETE COMPARISON:  01/11/2019 FINDINGS: Right Kidney: Renal measurements: 11.1 x 4.9 x 5.3 cm = volume: 152 mL . Echogenicity within normal  limits. There is a 1.6 cm cyst at the upper pole. No new mass or hydronephrosis visualized. Left Kidney: Absent Bladder: Decompressed by Foley catheter. Other: None. IMPRESSION: No hydronephrosis. Electronically Signed   By: Macy Mis M.D.   On: 01/20/2019 10:22   US RENAL  Result Date: 01/11/2019 CLINICAL DATA:  63 year old female with worsening renal function. Prior left nephrectomy. EXAM: RENAL / URINARY TRACT ULTRASOUND COMPLETE COMPARISON:  CT abdomen pelvis dated 01/25/2018 and renal ultrasound dated 12/16/2017. FINDINGS: Right Kidney: Renal measurements: 11.1 x 5.6 x 5.8 cm = volume: 191 mL. Mild increased renal echogenicity. No hydronephrosis or shadowing stone. There is a 1.6 x 1.7 x 1.8 cm hypoechoic lesion in the upper pole of the right kidney which is suboptimally characterized, possibly a cyst. Left Kidney: Prior nephrectomy. Bladder: Appears normal for degree of bladder distention. Other: None. IMPRESSION: Mild increased renal echogenicity. No hydronephrosis or shadowing stone. Electronically Signed   By: Anner Crete M.D.   On: 01/11/2019 22:48   DG Chest Port 1 View  Result Date: 01/10/2019 CLINICAL DATA:  Body aches and fevers. EXAM: PORTABLE CHEST 1 VIEW COMPARISON:  July 31, 2017 FINDINGS: The heart size is enlarged. There is no pneumothorax. No large pleural effusion. There is likely atelectasis at the lung bases. Aortic calcifications are noted. IMPRESSION: 1. Cardiomegaly without edema or pleural effusion. 2. Bibasilar atelectasis. 3. Aortic atherosclerosis. Electronically Signed   By: Constance Holster M.D.   On: 01/10/2019 16:52   DG Foot Complete Right  Result Date: 01/10/2019 CLINICAL DATA:  Foot pain pain EXAM: RIGHT FOOT COMPLETE - 3+ VIEW COMPARISON:  11/16/2015. FINDINGS: There are advanced degenerative changes of the first metatarsophalangeal joint. There is osteopenia. There is no acute displaced fracture. No dislocation. The patient is status post prior ORIF  of the ankle. Vascular calcifications are noted. IMPRESSION: No acute osseous abnormality. Electronically Signed   By: Constance Holster M.D.   On: 01/10/2019 15:09   Overnight EEG with  video  Result Date: 01/19/2019 Lora Havens, MD     01/20/2019  8:49 AM Patient Name: Rayelynn Loyal MRN: [id number] Epilepsy Attending: Lora Havens Referring Physician/Provider: Dr. Kerney Elbe Duration:01/18/2019 1359 to 01/19/2019 1359 Patient history: 63 year old female admitted for worsening renal function as well as concern for sepsis.  EEG obtained for altered mental status, to assess for seizures. Level of alertness: Awake/lethargic, sleep AEDs during EEG study: Phenytoin Technical aspects: This EEG study was done with scalp electrodes positioned according to the 10-20 International system of electrode placement. Electrical activity was acquired at a sampling rate of '500Hz'  and reviewed with a high frequency filter of '70Hz'  and a low frequency filter of '1Hz' . EEG data were recorded continuously and digitally stored. Description: EEG showed continuous generalized 2-5 Hz theta-delta slowing.  Triphasic waves, generalized, maximal bifrontal, at 2-2.'5Hz'  were also seen, more frequent when patient was stimulated. Sharp transients were seen in left frontotemporal region. No clear posterior dominant rhythm was seen.  Hyperventilation and photic stimulation were not performed. Abnormality -Continuous slow, generalized -Triphasic waves, generalized, maximal bifrontal IMPRESSION: This study showed evidence of moderate to severe diffuse encephalopathy, nonspecific etiology but could be secondary to toxic-metabolic causes.  No seizures or definite epileptiform discharges were seen throughout the recording.   VAS [place] LOWER EXTREMITY VENOUS (DVT)  Result Date: 01/12/2019  Lower Venous Study Indications: Swelling, and Edema.  Limitations: Patient unable to tolerate compression. Comparison Study: no prior Performing  Technologist: Abram Sander RVS  Examination Guidelines: A complete evaluation includes B-mode imaging, spectral Doppler, color Doppler, and power Doppler as needed of all accessible portions of each vessel. Bilateral testing is considered an integral part of a complete examination. Limited examinations for reoccurring indications may be performed as noted.  +---------+---------------+---------+-----------+----------+--------------+ RIGHT    CompressibilityPhasicitySpontaneityPropertiesThrombus Aging +---------+---------------+---------+-----------+----------+--------------+ CFV      Full           Yes      Yes                                 +---------+---------------+---------+-----------+----------+--------------+ SFJ      Full                                                        +---------+---------------+---------+-----------+----------+--------------+ FV Prox  Full                                                        +---------+---------------+---------+-----------+----------+--------------+ FV Mid   Full                                                        +---------+---------------+---------+-----------+----------+--------------+ FV Distal               Yes      Yes                                 +---------+---------------+---------+-----------+----------+--------------+  PFV      Full                                                        +---------+---------------+---------+-----------+----------+--------------+ POP      Full           Yes      Yes                                 +---------+---------------+---------+-----------+----------+--------------+ PTV      Full                                                        +---------+---------------+---------+-----------+----------+--------------+ PERO     Full                                                         +---------+---------------+---------+-----------+----------+--------------+   +---------+---------------+---------+-----------+----------+-------------------+ LEFT     CompressibilityPhasicitySpontaneityPropertiesThrombus Aging      +---------+---------------+---------+-----------+----------+-------------------+ CFV      Full           Yes      Yes                                      +---------+---------------+---------+-----------+----------+-------------------+ SFJ      Full                                                             +---------+---------------+---------+-----------+----------+-------------------+ FV Prox  Full                                                             +---------+---------------+---------+-----------+----------+-------------------+ FV Mid                  Yes      Yes                  unable to compress                                                        due to pain  tolerance           +---------+---------------+---------+-----------+----------+-------------------+ FV Distal               Yes      Yes                  unable to compress                                                        due to pain                                                               tolerance           +---------+---------------+---------+-----------+----------+-------------------+ PFV      Full                                                             +---------+---------------+---------+-----------+----------+-------------------+ POP                     Yes      Yes                  unable to compress                                                        due to pain                                                               tolerance           +---------+---------------+---------+-----------+----------+-------------------+ PTV                                                    unable to compress                                                        due to pain  tolerance           +---------+---------------+---------+-----------+----------+-------------------+ PERO                                                  unable to compress                                                        due to pain                                                               tolerance           +---------+---------------+---------+-----------+----------+-------------------+     Summary: Right: There is no evidence of deep vein thrombosis in the lower extremity. No cystic structure found in the popliteal fossa. Left: There is no evidence of deep vein thrombosis in the lower extremity. However, portions of this examination were limited- see technologist comments above. No cystic structure found in the popliteal fossa.  *See table(s) above for measurements and observations. Electronically signed by Harold Barban MD on 01/12/2019 at 2:43:13 PM.    Final     Lab Data:  CBC: Recent Labs  Lab 01/15/19 1815 01/16/19 7903 01/17/19 8333 01/18/19 0304 01/19/19 8329 01/20/19 0227 01/21/19 0313 01/22/19 0309  WBC 13.4* 18.4* 15.3* 15.1* 10.8* 8.6 11.9* 10.4  NEUTROABS 12.3* 15.3* 12.0* 11.7* 7.0  --   --   --   HGB 9.1* 8.2* 8.8* 7.7* 7.4* 6.5* 7.7* 7.2*  HCT 28.2* 24.8* 28.7* 24.6* 23.7* 21.6* 24.4* 23.0*  MCV 70.0* 69.1* 73.4* 73.0* 72.0* 74.5* 76.0* 76.7*  PLT 371 318 183 249 242 197 167 191   Basic Metabolic Panel: Recent Labs  Lab 01/15/19 1815 01/16/19 0227 01/17/19 0941 01/18/19 0304 01/19/19 0311 01/20/19 0227 01/21/19 0313 01/21/19 1413 01/22/19 0309  NA 142 146* 148* 147* 145 146* 146* 140 142  K 3.6 3.1* 4.1 3.7 3.4* 3.8 3.9 4.7 3.4*  CL 100 107 107 105 102 107 108 103 105  CO'2 27 30 28 32 30 25 28 26 26 '  GLUCOSE 105* 91  66* 82 68* 45* 73 130* 85  BUN 34* 34* 33* 31* 24* '22 19 18 16  ' CREATININE 2.83* 2.84* 3.03* 3.00* 3.01* 3.16* 3.71* 3.97* 3.75*  CALCIUM 8.6* 8.2* 8.5* 8.3* 8.3* 7.9* 8.1* 7.9* 7.9*  MG 2.3 2.0 1.8 1.6* 1.6* 1.5*  --   --  1.4*  PHOS 2.2* 2.1* 2.3* 2.4* 3.1  --   --   --   --    GFR: Estimated Creatinine Clearance: 15.3 mL/min (A) (by C-G formula based on SCr of 3.75 mg/dL (H)). Liver Function Tests: Recent Labs  Lab 01/15/19 1815 01/16/19 0227 01/17/19 0941 01/18/19 0304 01/19/19 0311  AST '18 16 21 21 ' 36  ALT '16 15 17 15 22  ' ALKPHOS 93 76 83 75 75  BILITOT 0.9 0.7 0.9 1.0 0.9  PROT 6.4* 5.3* 5.3* 4.9* 4.8*  ALBUMIN 2.9* 2.4* 2.5* 2.4* 2.5*  No results for input(s): LIPASE, AMYLASE in the last 168 hours. Recent Labs  Lab 01/17/19 1146  AMMONIA 14   Coagulation Profile: No results for input(s): INR, PROTIME in the last 168 hours. Cardiac Enzymes: No results for input(s): CKTOTAL, CKMB, CKMBINDEX, TROPONINI in the last 168 hours. BNP (last 3 results) No results for input(s): PROBNP in the last 8760 hours. HbA1C: No results for input(s): HGBA1C in the last 72 hours. CBG: Recent Labs  Lab 01/16/19 0455  GLUCAP 80   Lipid Profile: No results for input(s): CHOL, HDL, LDLCALC, TRIG, CHOLHDL, LDLDIRECT in the last 72 hours. Thyroid Function Tests: No results for input(s): TSH, T4TOTAL, FREET4, T3FREE, THYROIDAB in the last 72 hours. Anemia Panel: No results for input(s): VITAMINB12, FOLATE, FERRITIN, TIBC, IRON, RETICCTPCT in the last 72 hours. Urine analysis:    Component Value Date/Time   COLORURINE YELLOW 01/16/2019 1757   APPEARANCEUR CLEAR 01/16/2019 1757   LABSPEC 1.010 01/16/2019 1757   PHURINE 7.0 01/16/2019 1757   GLUCOSEU NEGATIVE 01/16/2019 1757   HGBUR NEGATIVE 01/16/2019 1757   BILIRUBINUR NEGATIVE 01/16/2019 1757   KETONESUR NEGATIVE 01/16/2019 1757   PROTEINUR NEGATIVE 01/16/2019 1757   NITRITE NEGATIVE 01/16/2019 1757   LEUKOCYTESUR NEGATIVE  01/16/2019 1757     Benito Mccreedy M.D. [facility] 01/22/2019, 12:22 PM  Pager: [phone] Between 7am to 7pm - call Pager - [phone]  After 7pm go to [email or web address] - password [email or web address]  Call night coverage person covering after 7pm

## 2019-01-22 NOTE — Progress Notes (Signed)
Patient ID: Carolyn Zamora, female   DOB: Nov 04, 1955, 63 y.o.   MRN: [id number] [facility] Progress Note   Assessment/ Plan:   1. 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  She got part of the prescribed intravenous fluids overnight however it appears that the IV infiltrated.  Would recommend monitoring her overnight after discontinuation of Foley catheter to ensure that she is able to void as well as renal function continues to improve with the anticipation of discharge tomorrow if both criteria met. 2.  Urinary tract infection with sepsis: Improving clinically, remains on ceftriaxone with resolution of sepsis markers. 3.  Systemic lupus erythematosus: Continue CellCept, hydroxychloroquine and will decrease prednisone to 20 mg daily starting tomorrow to continue weaning to her outpatient dose of 5 mg daily. 4.  Generalized tonic-clonic seizures: Seen earlier by neurology who feel that these were consistent with her metabolic encephalopathy.  Status post intravenous Dilantin with directions to switch to oral Dilantin when oral intake possible. 7.  Hypernatremia: Surprisingly significantly better on recheck labs yesterday afternoon and this morning in spite of incomplete fluid administration.  Subjective:   Denies any complaints overnight-nursing note reviewed significant for infiltration of IV.   Objective:   BP (!) 155/82 (BP Location: Left Wrist)   Pulse 100   Temp 98.8 F (37.1 C) (Oral)   Resp 20   Ht _0  (1.575 m)   Wt 80.4 kg   SpO2 100%   BMI 32.42 kg/m   Intake/Output Summary (Last 24 hours) at 01/22/2019 0838 Last data filed at 01/22/2019 4270 Gross per 24 hour  Intake 1250.72 ml   Output 800 ml  Net 450.72 ml   Weight change:   Physical Exam: Gen: Resting comfortably in bed, easy to awaken and engage in conversation CVS: Pulse regular rhythm, normal rate, S1 and S2 with ejection systolic murmur Resp: Diminished breath sounds over bases, no rales/rhonchi Abd: Soft, obese, nontender Ext: Minimal lower extremity edema around ankles.  Left upper arm swelling noted (chronic)  Imaging: US RENAL  Result Date: 01/20/2019 CLINICAL DATA:  Renal failure, history of left nephrectomy EXAM: RENAL / URINARY TRACT ULTRASOUND COMPLETE COMPARISON:  01/11/2019 FINDINGS: Right Kidney: Renal measurements: 11.1 x 4.9 x 5.3 cm = volume: 152 mL . Echogenicity within normal limits. There is a 1.6 cm cyst at the upper pole. No new mass or hydronephrosis visualized. Left Kidney: Absent Bladder: Decompressed by Foley catheter. Other: None. IMPRESSION: No hydronephrosis. Electronically Signed   By: Macy Mis M.D.   On: 01/20/2019 10:22    Labs: BMET Recent Labs  Lab 01/15/19 1815 01/16/19 0227 01/17/19 0941 01/18/19 0304 01/19/19 0311 01/20/19 0227 01/21/19 0313 01/21/19 1413 01/22/19 0309  NA 142 146* 148* 147* 145 146* 146* 140 142  K 3.6 3.1* 4.1 3.7 3.4* 3.8 3.9 4.7 3.4*  CL 100 107 107 105 102 107 108 103 105  CO_1  GLUCOSE 105* 91 66* 82 68* 45* 73 130* 85  BUN 34* 34* 33* 31* 24* _2 CREATININE 2.83* 2.84* 3.03* 3.00* 3.01* 3.16* 3.71* 3.97* 3.75*  CALCIUM 8.6* 8.2* 8.5* 8.3* 8.3* 7.9* 8.1* 7.9* 7.9*  PHOS 2.2* 2.1* 2.3* 2.4* 3.1  --   --   --   --  CBC Recent Labs  Lab 01/16/19 0227 01/17/19 0941 01/18/19 0304 01/19/19 0311 01/20/19 0227 01/21/19 0313 01/22/19 0309  WBC 18.4* 15.3* 15.1* 10.8* 8.6 11.9* 10.4  NEUTROABS 15.3* 12.0* 11.7* 7.0  --   --   --   HGB 8.2* 8.8* 7.7* 7.4* 6.5* 7.7* 7.2*  HCT 24.8* 28.7* 24.6* 23.7* 21.6* 24.4* 23.0*  MCV 69.1* 73.4* 73.0* 72.0* 74.5* 76.0* 76.7*  PLT 318 183 249 242 197  167 176    Medications:    . sodium chloride   Intravenous Once  . amLODipine  10 mg Oral Daily  . Chlorhexidine Gluconate Cloth  6 each Topical Daily  . darbepoetin (ARANESP) injection - NON-DIALYSIS  150 mcg Subcutaneous Q Fri-1800  . DULoxetine  60 mg Oral BID  . heparin injection (subcutaneous)  5,000 Units Subcutaneous Q8H  . hydroxychloroquine  200 mg Oral BID  . levothyroxine  25 mcg Oral Q0600  . lidocaine  1 patch Transdermal Q24H  . magnesium oxide  400 mg Oral BID  . mycophenolate  500 mg Oral q morning - 10a  . pantoprazole  40 mg Oral Daily  . phenytoin (DILANTIN) IV  100 mg Intravenous Q8H  . polyethylene glycol  17 g Oral BID  . predniSONE  30 mg Oral Q breakfast  . senna-docusate  1 tablet Oral BID   Elmarie Shiley, MD 01/22/2019, 8:38 AM

## 2019-01-23 LAB — COMPREHENSIVE METABOLIC PANEL
ALT: 21 U/L (ref 0–44)
AST: 37 U/L (ref 15–41)
Albumin: 2.6 g/dL — ABNORMAL LOW (ref 3.5–5.0)
Alkaline Phosphatase: 59 U/L (ref 38–126)
Anion gap: 11 (ref 5–15)
BUN: 13 mg/dL (ref 8–23)
CO2: 23 mmol/L (ref 22–32)
Calcium: 7.8 mg/dL — ABNORMAL LOW (ref 8.9–10.3)
Chloride: 105 mmol/L (ref 98–111)
Creatinine, Ser: 3.91 mg/dL — ABNORMAL HIGH (ref 0.44–1.00)
GFR calc Af Amer: 13 mL/min — ABNORMAL LOW (ref >60)
GFR calc non Af Amer: 12 mL/min — ABNORMAL LOW (ref >60)
Glucose, Bld: 65 mg/dL — ABNORMAL LOW (ref 70–99)
Potassium: 4.5 mmol/L (ref 3.5–5.1)
Sodium: 139 mmol/L (ref 135–145)
Total Bilirubin: 0.7 mg/dL (ref 0.3–1.2)
Total Protein: 4.4 g/dL — ABNORMAL LOW (ref 6.5–8.1)

## 2019-01-23 MED ORDER — PHENYTOIN SODIUM EXTENDED 100 MG PO CAPS: 300.0000 mg | ORAL_CAPSULE | Freq: Every day | ORAL | Status: DC

## 2019-01-23 MED ORDER — PHENYTOIN SODIUM EXTENDED 100 MG PO CAPS
100.0000 mg | ORAL_CAPSULE | Freq: Two times a day (BID) | ORAL | Status: AC
  Administered 2019-01-23 (×2): 100 mg via ORAL
  Filled 2019-01-23 (×3): qty 1

## 2019-01-23 MED ORDER — DEXTROSE-NACL 2.5-0.45 % IV SOLN: INTRAVENOUS | Status: DC

## 2019-01-23 MED ORDER — DEXTROSE-NACL 5-0.45 % IV SOLN
INTRAVENOUS | Status: DC
  Administered 2019-01-23 – 2019-01-24 (×2): via INTRAVENOUS

## 2019-01-23 NOTE — Progress Notes (Signed)
Patient wants to leave AMA, spoke to Patients daughter, Gae Bon and patient at same time. Discussed risk of leaving with her creatinine rising. Daughter is in agreement for the patient to stay in hospital until physician releases her. Patient is upset and becoming tearful but will stay again tonight but states "I am going home tomorrow". Dr Vista Lawman aware of patients wishes and spoke with her this morning.

## 2019-01-23 NOTE — Progress Notes (Signed)
Pt has d 2.5% and 0.45 NS ordered. We were not able to find this fluids anywhere in the hospital. We called material, pharmacy, ICU units.  Rai, MD notified and asked if this fluids can be replaced.

## 2019-01-23 NOTE — Progress Notes (Signed)
Patient ID: Novice Vrba, female   DOB: 1955-06-17, 63 y.o.   MRN: [id number] [facility] Progress Note   Assessment/ Plan:   1. Acute kidney Injury on chronic kidney disease stage IV patient with solitary kidney baseline creatinine around 2.8: Unclear insult however suspected to be ATN as corroborated by her fractional excretion of sodium and creatinine remains stuck at around 3.7-3.9 again confirming clinical suspicion of ATN.  Urine output charted overnight unimpressive after discontinuation of Foley catheter (patient likely urinating in [facility] along with BM).  Will empirically check bladder scan.  Would recommend monitoring another 24 hours to verify stable/improving creatinine.  BUN continues to improve and she does not have any acute electrolyte abnormalities prompting intervention/change in management. 2.  Urinary tract infection with sepsis: Improving clinically, remains on ceftriaxone with resolution of sepsis markers. 3.  Systemic lupus erythematosus: Continue CellCept, hydroxychloroquine and ongoing weaning of prednisone back to home dose of 5 mg daily. 4.  Generalized tonic-clonic seizures: Seen earlier by neurology who feel that these were consistent with her metabolic encephalopathy.  On intravenous phenytoin with levels from 12/10 noted-transition to oral per neurology recommendation. 7.  Hypernatremia: Improving with better oral intake-encouraged.  Subjective:   Overnight events noted, patient got out of bed unsupervised without fall/injury.  She denies any current complaints.   Objective:   BP 94/66 (BP Location: Right Arm)   Pulse 70   Temp 98.3 F (36.8 C) (Oral)   Resp 20   Ht 5\' 2"  (1.575 m)   Wt 81 kg   SpO2 98%   BMI 32.66 kg/m   Intake/Output Summary (Last 24 hours) at 01/23/2019 0807 Last data filed at 01/23/2019 0617 Gross per 24 hour  Intake 480 ml  Output 591 ml  Net -111 ml   Weight change: 0.6 kg  Physical Exam: Gen: Resting  comfortably in bed, easy to awaken and engage in conversation CVS: Pulse regular rhythm, normal rate, S1 and S2 with ejection systolic murmur Resp: Diminished breath sounds over bases, no rales/rhonchi Abd: Soft, obese, nontender Ext: Minimal lower extremity edema around ankles.  Left upper arm swelling noted (chronic)  Imaging: No results found.  Labs: BMET Recent Labs  Lab 01/17/19 0941 01/18/19 0304 01/19/19 0311 01/20/19 0227 01/21/19 0313 01/21/19 1413 01/22/19 0309 01/23/19 0418  NA 148* 147* 145 146* 146* 140 142 139  K 4.1 3.7 3.4* 3.8 3.9 4.7 3.4* 4.5  CL 107 105 102 107 108 103 105 105  CO2 28 32 30 25 28 26 26 23   GLUCOSE 66* 82 68* 45* 73 130* 85 65*  BUN 33* 31* 24* 22 19 18 16 13   CREATININE 3.03* 3.00* 3.01* 3.16* 3.71* 3.97* 3.75* 3.91*  CALCIUM 8.5* 8.3* 8.3* 7.9* 8.1* 7.9* 7.9* 7.8*  PHOS 2.3* 2.4* 3.1  --   --   --   --   --    CBC Recent Labs  Lab 01/17/19 0941 01/18/19 0304 01/19/19 0311 01/20/19 0227 01/21/19 0313 01/22/19 0309  WBC 15.3* 15.1* 10.8* 8.6 11.9* 10.4  NEUTROABS 12.0* 11.7* 7.0  --   --   --   HGB 8.8* 7.7* 7.4* 6.5* 7.7* 7.2*  HCT 28.7* 24.6* 23.7* 21.6* 24.4* 23.0*  MCV 73.4* 73.0* 72.0* 74.5* 76.0* 76.7*  PLT 183 249 242 197 167 176    Medications:    . amLODipine  10 mg Oral Daily  . Chlorhexidine Gluconate Cloth  6 each Topical Daily  . darbepoetin (ARANESP) injection - NON-DIALYSIS  150 mcg Subcutaneous Q Fri-1800  . DULoxetine  60 mg Oral BID  . heparin injection (subcutaneous)  5,000 Units Subcutaneous Q8H  . hydroxychloroquine  200 mg Oral BID  . levothyroxine  25 mcg Oral Q0600  . lidocaine  1 patch Transdermal Q24H  . magnesium oxide  400 mg Oral BID  . mycophenolate  500 mg Oral q morning - 10a  . pantoprazole  40 mg Oral Daily  . phenytoin (DILANTIN) IV  100 mg Intravenous Q8H  . polyethylene glycol  17 g Oral BID  . predniSONE  20 mg Oral Q breakfast  . senna-docusate  1 tablet Oral BID   Elmarie Shiley,  MD 01/23/2019, 8:07 AM

## 2019-01-23 NOTE — Progress Notes (Signed)
Pt bed alarm went off at 0216 am on 01/23/19. Upon entering pts room, RN found pt on her knee next to the bed with her elbows resting on the bed. Pt was attempting to get back to the bed. She stated" I was trying not to mess up bed, so I jumped from bed to my knees". She stated that she did not fall.  Pt had yellow socks on her feet, all her belongings within reach, bed alarm on, she confirmed that RN and NT instructed her to call for assistance before she gets out of the bed.  Pt denied pain and injuries, alert and oriented x 4, able to move all her extremities, VS stable except for HR slightly elevated and BP elevated. Pt assisted to bedside commode to finish using bathroom.  [place], NP notified.  Post fall huddle completed. Will continue to monitor pt.

## 2019-01-23 NOTE — Progress Notes (Signed)
[facility]                                                                              Patient Demographics  Carolyn Zamora, is a 63 y.o. female, DOB - 01/16/1956, NWG:[id number]  Admit date - 01/10/2019   Admitting Physician Lenore Cordia, MD  Outpatient Primary MD for the patient is Beckie Salts, MD  Outpatient specialists:   LOS - 12  days    Chief Complaint  Patient presents with  . Foot Pain       Brief summary   Carolyn Zamora is a 63 y.o. female with  Multiple complicated medical history including but not limited to renal cell carcinoma status post left nephrectomy, history of splenectomy, CKD stage III, chronic pain syndrome, lupus-on chronic immunosuppressive therapy,  and history of gout presenting with  Sepsis of urinary source and worsening renal function.  She also complained of  feet pain, and had been confused.    Assessment & Plan    Principal Problem:   Sepsis ([place]) Active Problems:   Hypertension   Systemic lupus erythematosus (HCC)   Chronic pain syndrome   Renal cell carcinoma of left kidney s/p nephrectomy   Anemia of chronic disease   Renal failure (ARF), acute on chronic (HCC)   Pain in both feet   Hypomagnesemia   Sepsis Leukocytosis, tachy on presentation  Urinary source Abnormal urinalysis, urine culture negative. MRSA PCR was negative.  CT scan abdomen,01/15/2019 -small left pleural effusion mild right hydronephrosis and perinephric stranding COVID-19 negative, lipase within normal limits.  Blood cultures 01/11/2019 - NGTD at 5 Days.  Off vancomycin - Rocephin IV since 01/16/2019  AKI on CKD stage IV Metabolic Acidosis, improved Likely secondary to sepsis. was on IV Sodium Bicarbonate and then D5W; creatinine uptrend 01/21/2019 to 3.7 from 3.1.  exact etiology unclear but  Nephrology consultant suspect ATN and recommend IV fluid overnight with discontinuation of Foley catheter today 01/22/2019, and if able  to void with down trend of renal function done discharged tomorrow 01/23/2019.However there was IV infiltration, again and no IV running this morning - discussed with RN, and patient agrees to stay another day- we may have to get a PICC.  Metabolic encephalopathy  Head CT negative Ammonia level 14 ? Subclinical seizures  Improving  Confusion and Seizure Like Activitywith witnessed Tonic-ClonicSeizures x2 Neurology  Consulted EEG showed severe encephalopathy, nonspecific finding Patient was loaded with IV Fosphenytoin 1500 mg and continued on Dilantin.  Keppra is not a good choice given her impaired renal function.   Bilateral Feet Pain with tophaceous gout. Duplex ultrasound was negative for DVT.  History of gout with LDH was 238, Uric Acid was 11.5, and ESR was <4;   Avoid NSAIDs due to underlying renal failure- steroids for now -Taper as needed  Supportive pain control  Mild hypomagnesemia.   Magnesium of 1.5 on 01/20/2019.   continue oral magnesium, follow  Mild hypokalemia.   Replenished      Microcytic Anemia/Anemia of Chronic Kidney Disease  No signs of active bleeding, monitor H&H closely.   Hemoglobin is 7.7 today from 6.5 yesterday.  Status post 1 unit PRBC transfusion.  Lupus, currently not in flare -Mycophenolate on hold due to somnolence, now improved will restart.   Prednisone taper down to 5 mg in the coming weeks if she is able to tolerate p.o. Continue hydroxychloroquine 200 mg po BID.    hypertension  cont bp meds  Depression/Anxiety Continue duloxetine.  Continue to hold trazodone for now.  GERD On Pantoprazole -continue  Chronic Pain Syndrome Continue Norco, duloxetine.    Off tizanidine and Lyrica.    Obesity Lifestyle modification advised  Left Arm Swelling From IV infiltration.  Supportive care  Hypernatremia Been started on D5 half-normal saline.  Sodium level of 146 gain today.   Acute Urinary Retention -Seen by  [facility], urology on board for mild hydronephrosis.  On a Foley catheter.  Ultrasound without hydronephrosis.  No further management plan as per urology.  Consider discontinuation of urinary catheter if her renal function improves and at baseline.                           Code Status: full code DVT Prophylaxis:  heparin  Family Communication: Discussed in detail with the patient, all imaging results, lab results explained to the patient    Disposition Plan: home  Time Spent in minutes  22 minutes  Procedures:   Foley catheter  Long-term EEG  Consultants:    Neurology,   nephrology,   [facility]  Antimicrobials:  Rocephin IV   Medications  Scheduled Meds: . amLODipine  10 mg Oral Daily  . Chlorhexidine Gluconate Cloth  6 each Topical Daily  . darbepoetin (ARANESP) injection - NON-DIALYSIS  150 mcg Subcutaneous Q Fri-1800  . DULoxetine  60 mg Oral BID  . heparin injection (subcutaneous)  5,000 Units Subcutaneous Q8H  . hydroxychloroquine  200 mg Oral BID  . levothyroxine  25 mcg Oral Q0600  . lidocaine  1 patch Transdermal Q24H  . magnesium oxide  400 mg Oral BID  . mycophenolate  500 mg Oral q morning - 10a  . pantoprazole  40 mg Oral Daily  . phenytoin  100 mg Oral BID   Followed by  . [START ON 01/24/2019] phenytoin  300 mg Oral QHS  . polyethylene glycol  17 g Oral BID  . predniSONE  20 mg Oral Q breakfast  . senna-docusate  1 tablet Oral BID   Continuous Infusions: . cefTRIAXone (ROCEPHIN)  IV 1 g (01/22/19 1711)   PRN Meds:.acetaminophen **OR** acetaminophen, acetaminophen, dicyclomine, diphenhydrAMINE, HYDROcodone-acetaminophen, HYDROmorphone (DILAUDID) injection, ondansetron **OR** ondansetron (ZOFRAN) IV, prochlorperazine   Antibiotics   Anti-infectives (From admission, onward)   Start     Dose/Rate Route Frequency Ordered Stop   01/16/19 1630  cefTRIAXone (ROCEPHIN) 1 g in sodium chloride 0.9 % 100 mL IVPB      1 g 200 mL/hr over 30 Minutes Intravenous Every 24 hours 01/16/19 1608     01/14/19 0600  vancomycin (VANCOCIN) IVPB 1000 mg/200 mL premix  Status:  Discontinued     1,000 mg 200 mL/hr over 60 Minutes Intravenous Every 48 hours 01/12/19 1041 01/15/19 0816   01/12/19 0445  vancomycin (VANCOCIN) 1,500 mg in sodium chloride 0.9 % 500 mL IVPB     1,500 mg 250 mL/hr over 120 Minutes Intravenous NOW 01/12/19 0426 01/12/19 0750   01/11/19 2200  hydroxychloroquine (PLAQUENIL) tablet 200 mg     200 mg Oral 2 times daily 01/11/19 2105     01/11/19 1830  vancomycin (VANCOCIN) 1,500 mg in sodium chloride 0.9 % 500 mL IVPB  Status:  Discontinued     1,500 mg 250 mL/hr over 120 Minutes Intravenous  Once 01/11/19 1817 01/12/19 0426   01/11/19 1830  ceFEPIme (MAXIPIME) 2 g in sodium chloride 0.9 % 100 mL IVPB  Status:  Discontinued     2 g 200 mL/hr over 30 Minutes Intravenous Every 24 hours 01/11/19 1817 01/16/19 0928   01/11/19 1818  vancomycin variable dose per unstable renal function (pharmacist dosing)  Status:  Discontinued      Does not apply See admin instructions 01/11/19 1818 01/15/19 0816   01/10/19 1845  cefTRIAXone (ROCEPHIN) 1 g in sodium chloride 0.9 % 100 mL IVPB     1 g 200 mL/hr over 30 Minutes Intravenous  Once 01/10/19 1835 01/10/19 2004        Subjective:   Jamil Castillo was seen and examined today. Feeling weell, wants to go home - advised to wait one more day  Objective:   Vitals:   01/23/19 0500 01/23/19 0509 01/23/19 0546 01/23/19 0724  BP: (!) 148/63  (!) 162/79 94/66  Pulse:    70  Resp: (!) 22 (!) 23 (!) 21 20  Temp:    98.3 F (36.8 C)  TempSrc:    Oral  SpO2:    98%  Weight:      Height:        Intake/Output Summary (Last 24 hours) at 01/23/2019 1121 Last data filed at 01/23/2019 0617 Gross per 24 hour  Intake 480 ml  Output 591 ml  Net -111 ml     Wt Readings from Last 3 Encounters:  01/23/19 81 kg  01/25/18 77.1 kg  12/17/17 82.6 kg      Exam  General: NAD  HEENT: NCAT,  PERRL,MMM  Neck: SUPPLE, (-) JVD  Cardiovascular: RRR, (-) GALLOP, (-) MURMUR  Respiratory: CTA  Gastrointestinal: SOFT, (-) DISTENSION, BS(+), (_) TENDERNESS  Ext: (-) CYANOSIS, (-) EDEMA  Neuro: A, OX 3  Skin:(-) RASH  Psych:NORMAL AFFECT/MOOD   Data Reviewed:  I have personally reviewed following labs and imaging studies  Micro Results Recent Results (from the past 240 hour(s))  Culture, Urine     Status: None   Collection Time: 01/16/19  5:57 PM   Specimen: Urine, Random  Result Value Ref Range Status   Specimen Description URINE, RANDOM  Final   Special Requests NONE  Final   Culture   Final    NO GROWTH Performed at [facility], [address] [place], [place] [address]    Report Status 01/18/2019 FINAL  Final    Radiology Reports EEG  Result Date: 01/16/2019 Alexis Goodell, MD     01/16/2019  4:06 PM ELECTROENCEPHALOGRAM REPORT Patient: Alonie Gazzola       Room #: 1B14N EEG No. ID: [id number] Age: 63 y.o.        Sex: female Referring Physician: Alfredia Ferguson Report Date:  01/16/2019       Interpreting Physician: Alexis Goodell History: Carolyn Zamora is an 63 y.o. female with sepsis Medications: Aranesp, Cymbalta, Plaquenil, Synthroid, Deltasone Conditions of Recording:  This is a 21 channel routine scalp EEG performed with bipolar and monopolar montages arranged in accordance to the international 10/20 system of electrode placement. One channel was dedicated to EKG recording. The patient is in the altered state. Description:  The background activity is slow and disorganized.  This slow and poorly organized background is diffusely distributed.  There  are also frequent diffusely distributed periodic discharges of triphasic morphology seen throughout the recording.  Hyperventilation and intermittent photic stimulation were not performed. IMPRESSION: This is a markedly abnormal electroencephalogram secondary to a slow  and disorganized background with frequent diffusely distributed triphasic waves.  This finding is consistent with a severe encephalopathy, etiologically nonspecific.  Alexis Goodell, MD Neurology [phone] 01/16/2019, 4:00 PM   CT ABDOMEN PELVIS WO CONTRAST  Result Date: 01/15/2019 CLINICAL DATA:  63 year old female with abdominal distention, nausea vomiting. EXAM: CT ABDOMEN AND PELVIS WITHOUT CONTRAST TECHNIQUE: Multidetector CT imaging of the abdomen and pelvis was performed following the standard protocol without IV contrast. COMPARISON:  CT abdomen pelvis dated 01/12/2019. FINDINGS: Evaluation of this exam is limited in the absence of intravenous contrast. Evaluation is also limited due to streak artifact caused by patient's arms. Lower chest: Partially visualized small left pleural effusion with left lung base partial atelectasis versus infiltrate. Clinical correlation is recommended. There is a 7 mm subpleural nodule at the right lung base (series 5 image 28) similar to the study of 2019. There is hypoattenuation of the cardiac blood pool suggestive of a degree of anemia. Clinical correlation is recommended. No intra-abdominal free air or free fluid. Hepatobiliary: The liver is grossly unremarkable. Minimal layering sludge may be present within the gallbladder. No pericholecystic fluid or evidence of acute cholecystitis by CT. Pancreas: The pancreas is mildly atrophic. No active inflammatory changes. 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 Adrenals/Urinary Tract: Status post prior left nephrectomy. There is mild right hydronephrosis. Right perinephric stranding noted. Correlation with urinalysis recommended to exclude UTI. No obstructing stone identified. The urinary bladder is grossly unremarkable. Stomach/Bowel: There is no bowel obstruction or active inflammation. The appendix is normal. Vascular/Lymphatic: Moderate aortoiliac atherosclerotic disease. The IVC is  grossly unremarkable. No portal venous gas. There is no adenopathy. Reproductive: Probable partially calcified exophytic fibroid from the uterine fundus. No adnexal masses. Other: Mild diffuse subcutaneous edema. No drainable fluid collection. Musculoskeletal: Avascular necrosis of the femoral heads bilaterally. There is irregularity of the right femoral head consistent with subcortical collapse, chronic. Severe degenerative changes at L4-L5 and L5-S1. There is grade 2 L4-L5 anterolisthesis and grade 1 L5-S1 retrolisthesis. There is associated moderate narrowing of the central canal at this level. No acute osseous pathology. IMPRESSION: 1. Small left pleural effusion with left lung base partial atelectasis versus infiltrate. Clinical correlation is recommended. 2. Mild right hydronephrosis, new since the prior CT. There is right perinephric stranding. Correlation with urinalysis recommended to exclude UTI. 3. No bowel obstruction or active inflammation. Normal appendix. 4. Aortic Atherosclerosis (ICD10-I70.0). 5. Lower lumbar listhesis and degenerative changes with associated narrowing of the central canal noted Electronically Signed   By: Anner Crete M.D.   On: 01/15/2019 22:48   CT ABDOMEN PELVIS WO CONTRAST  Result Date: 01/12/2019 CLINICAL DATA:  Hydronephrosis, history of CKD EXAM: CT ABDOMEN AND PELVIS WITHOUT CONTRAST TECHNIQUE: Multidetector CT imaging of the abdomen and pelvis was performed following the standard protocol without IV contrast. COMPARISON:  Renal ultrasound January 11, 2019 CT October 22, 2018 FINDINGS: Lower chest: The visualized heart size within normal limits. No pericardial fluid/thickening. No hiatal hernia. There is patchy area of posterior consolidation seen at both lung bases. Hepatobiliary: Although limited due to the lack of intravenous contrast, normal in appearance without gross focal abnormality. Mildly dilated fluid-filled gallbladder. No calcified gallstones.  Pancreas:  Unremarkable.  No surrounding inflammatory changes. Spleen: A small amount of splenic tissue seen  within the left upper quadrant. Adrenals/Urinary Tract: Both adrenal glands appear normal. Again noted is a left-sided adrenal nephrectomy. There is mild right-sided perinephric stranding and proximal periureteral stranding. No right-sided renal, collecting system, or bladder calculi however are noted. Stomach/Bowel: The stomach, small bowel, and colon are normal in appearance. No inflammatory changes or obstructive findings. Vascular/Lymphatic: There are no enlarged abdominal or pelvic lymph nodes. Scattered aortic atherosclerotic calcifications are seen without aneurysmal dilatation. Reproductive: Again noted is a partially exophytic right posterior fibroid with coarse calcifications. Other: No evidence of abdominal wall mass or hernia. Musculoskeletal: No acute or significant osseous findings. Again noted is a grade 2 anterolisthesis of L4 on L5 with endplate irregularity and collapse of the anterior superior endplate of L5, and also fragmentation of the posteroinferior L4 vertebral body. This is seen dating back to MRI of 2019. 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 IMPRESSION: 1. Patchy airspace opacity seen at both lung bases which is non-specific could be related to early infectious etiology and/or atelectasis. 2. Mild inflammatory changes surrounding the right kidney which could be due to pyelonephritis. No right-sided renal or collecting system calculi. 3.  Aortic Atherosclerosis (ICD10-I70.0). Electronically Signed   By: Prudencio Pair M.D.   On: 01/12/2019 02:14   DG Abd 1 View  Result Date: 01/15/2019 CLINICAL DATA:  Abdominal pain. EXAM: ABDOMEN - 1 VIEW COMPARISON:  CT scan January 15, 2019 FINDINGS: The lung bases are unremarkable. No free air, portal venous gas, or pneumatosis. Contrast is seen throughout the colon. No evidence of bowel obstruction. No other acute abnormalities  identified. IMPRESSION: No acute abnormality seen.  No evidence of bowel obstruction. Electronically Signed   By: Dorise Bullion III M.D   On: 01/15/2019 22:58   CT HEAD WO CONTRAST  Result Date: 01/16/2019 CLINICAL DATA:  Unexplained altered level of consciousness. EXAM: CT HEAD WITHOUT CONTRAST TECHNIQUE: Contiguous axial images were obtained from the base of the skull through the vertex without intravenous contrast. COMPARISON:  11/05/2015 FINDINGS: Brain: Some motion degradation. Mild atrophy as seen previously. No sign of acute infarction, mass lesion, hemorrhage, hydrocephalus or extra-axial collection. Vascular: There is atherosclerotic calcification of the major vessels at the base of the brain. Skull: Negative Sinuses/Orbits: Clear/normal Other: None IMPRESSION: No acute finding.  Mild atrophy. Electronically Signed   By: Nelson Chimes M.D.   On: 01/16/2019 13:42   MR BRAIN WO CONTRAST  Result Date: 01/16/2019 CLINICAL DATA:  63 year old female with unexplained altered mental status. History of renal cell carcinoma status post nephrectomy, and lupus on chronic immunosuppressive therapy. EXAM: MRI HEAD WITHOUT CONTRAST TECHNIQUE: Multiplanar, multiecho pulse sequences of the brain and surrounding structures were obtained without intravenous contrast. COMPARISON:  Head CT earlier today. [facility]. Brain MRI 07/27/2014 FINDINGS: Brain: Generalized cerebral volume loss since 2016, mild-to-moderate. No restricted diffusion to suggest acute infarction. No midline shift, mass effect, evidence of mass lesion, ventriculomegaly, extra-axial collection or acute intracranial hemorrhage. Cervicomedullary junction and pituitary are within normal limits. Scattered mostly subcortical white matter T2 and FLAIR hyperintensity is not significantly changed since 2016. No superimposed cortical encephalomalacia or chronic cerebral blood products. Deep gray matter nuclei,  brainstem and cerebellum appear negative. Vascular: Major intracranial vascular flow voids are stable since 2016. Chronic generalized intracranial artery tortuosity. Skull and upper cervical spine: Normal visible cervical spine. Visualized bone marrow signal is within normal limits. Sinuses/Orbits: Negative orbits. Trace paranasal sinus mucosal thickening is stable. Other: Trace left  mastoid effusion Right mastoids has regressed remain clear. Negative visible internal auditory structures. Scalp and face soft tissues appear negative. IMPRESSION: 1. No acute intracranial abnormality. 2. Stable white matter signal changes since 2016, nonspecific but most commonly due to chronic small vessel disease. 3. Mild to moderate generalized cerebral volume loss since 2006. Electronically Signed   By: Genevie Ann M.D.   On: 01/16/2019 14:50   US RENAL  Result Date: 01/20/2019 CLINICAL DATA:  Renal failure, history of left nephrectomy EXAM: RENAL / URINARY TRACT ULTRASOUND COMPLETE COMPARISON:  01/11/2019 FINDINGS: Right Kidney: Renal measurements: 11.1 x 4.9 x 5.3 cm = volume: 152 mL . Echogenicity within normal limits. There is a 1.6 cm cyst at the upper pole. No new mass or hydronephrosis visualized. Left Kidney: Absent Bladder: Decompressed by Foley catheter. Other: None. IMPRESSION: No hydronephrosis. Electronically Signed   By: Macy Mis M.D.   On: 01/20/2019 10:22   US RENAL  Result Date: 01/11/2019 CLINICAL DATA:  63 year old female with worsening renal function. Prior left nephrectomy. EXAM: RENAL / URINARY TRACT ULTRASOUND COMPLETE COMPARISON:  CT abdomen pelvis dated 01/25/2018 and renal ultrasound dated 12/16/2017. FINDINGS: Right Kidney: Renal measurements: 11.1 x 5.6 x 5.8 cm = volume: 191 mL. Mild increased renal echogenicity. No hydronephrosis or shadowing stone. There is a 1.6 x 1.7 x 1.8 cm hypoechoic lesion in the upper pole of the right kidney which is suboptimally characterized, possibly a cyst. Left  Kidney: Prior nephrectomy. Bladder: Appears normal for degree of bladder distention. Other: None. IMPRESSION: Mild increased renal echogenicity. No hydronephrosis or shadowing stone. Electronically Signed   By: Anner Crete M.D.   On: 01/11/2019 22:48   DG Chest Port 1 View  Result Date: 01/10/2019 CLINICAL DATA:  Body aches and fevers. EXAM: PORTABLE CHEST 1 VIEW COMPARISON:  July 31, 2017 FINDINGS: The heart size is enlarged. There is no pneumothorax. No large pleural effusion. There is likely atelectasis at the lung bases. Aortic calcifications are noted. IMPRESSION: 1. Cardiomegaly without edema or pleural effusion. 2. Bibasilar atelectasis. 3. Aortic atherosclerosis. Electronically Signed   By: Constance Holster M.D.   On: 01/10/2019 16:52   DG Foot Complete Right  Result Date: 01/10/2019 CLINICAL DATA:  Foot pain pain EXAM: RIGHT FOOT COMPLETE - 3+ VIEW COMPARISON:  11/16/2015. FINDINGS: There are advanced degenerative changes of the first metatarsophalangeal joint. There is osteopenia. There is no acute displaced fracture. No dislocation. The patient is status post prior ORIF of the ankle. Vascular calcifications are noted. IMPRESSION: No acute osseous abnormality. Electronically Signed   By: Constance Holster M.D.   On: 01/10/2019 15:09   Overnight EEG with video  Result Date: 01/19/2019 Lora Havens, MD     01/20/2019  8:49 AM Patient Name: Carolyn Zamora MRN: [id number] Epilepsy Attending: Lora Havens Referring Physician/Provider: Dr. Kerney Elbe Duration:01/18/2019 1359 to 01/19/2019 1359 Patient history: 63 year old female admitted for worsening renal function as well as concern for sepsis.  EEG obtained for altered mental status, to assess for seizures. Level of alertness: Awake/lethargic, sleep AEDs during EEG study: Phenytoin Technical aspects: This EEG study was done with scalp electrodes positioned according to the 10-20 International system of electrode placement.  Electrical activity was acquired at a sampling rate of _0  and reviewed with a high frequency filter of _1  and a low frequency filter of _2 . EEG data were recorded continuously and digitally stored. Description: EEG showed continuous generalized 2-5 Hz theta-delta slowing.  Triphasic waves, generalized, maximal bifrontal, at 2-2._3   were also seen, more frequent when patient was stimulated. Sharp transients were seen in left frontotemporal region. No clear posterior dominant rhythm was seen.  Hyperventilation and photic stimulation were not performed. Abnormality -Continuous slow, generalized -Triphasic waves, generalized, maximal bifrontal IMPRESSION: This study showed evidence of moderate to severe diffuse encephalopathy, nonspecific etiology but could be secondary to toxic-metabolic causes.  No seizures or definite epileptiform discharges were seen throughout the recording.   VAS [place] LOWER EXTREMITY VENOUS (DVT)  Result Date: 01/12/2019  Lower Venous Study Indications: Swelling, and Edema.  Limitations: Patient unable to tolerate compression. Comparison Study: no prior Performing Technologist: Abram Sander RVS  Examination Guidelines: A complete evaluation includes B-mode imaging, spectral Doppler, color Doppler, and power Doppler as needed of all accessible portions of each vessel. Bilateral testing is considered an integral part of a complete examination. Limited examinations for reoccurring indications may be performed as noted.  +---------+---------------+---------+-----------+----------+--------------+ RIGHT    CompressibilityPhasicitySpontaneityPropertiesThrombus Aging +---------+---------------+---------+-----------+----------+--------------+ CFV      Full           Yes      Yes                                 +---------+---------------+---------+-----------+----------+--------------+ SFJ      Full                                                         +---------+---------------+---------+-----------+----------+--------------+ FV Prox  Full                                                        +---------+---------------+---------+-----------+----------+--------------+ FV Mid   Full                                                        +---------+---------------+---------+-----------+----------+--------------+ FV Distal               Yes      Yes                                 +---------+---------------+---------+-----------+----------+--------------+ PFV      Full                                                        +---------+---------------+---------+-----------+----------+--------------+ POP      Full           Yes      Yes                                 +---------+---------------+---------+-----------+----------+--------------+ PTV      Full                                                        +---------+---------------+---------+-----------+----------+--------------+  PERO     Full                                                        +---------+---------------+---------+-----------+----------+--------------+   +---------+---------------+---------+-----------+----------+-------------------+ LEFT     CompressibilityPhasicitySpontaneityPropertiesThrombus Aging      +---------+---------------+---------+-----------+----------+-------------------+ CFV      Full           Yes      Yes                                      +---------+---------------+---------+-----------+----------+-------------------+ SFJ      Full                                                             +---------+---------------+---------+-----------+----------+-------------------+ FV Prox  Full                                                             +---------+---------------+---------+-----------+----------+-------------------+ FV Mid                  Yes      Yes                  unable to compress                                                         due to pain                                                               tolerance           +---------+---------------+---------+-----------+----------+-------------------+ FV Distal               Yes      Yes                  unable to compress                                                        due to pain  tolerance           +---------+---------------+---------+-----------+----------+-------------------+ PFV      Full                                                             +---------+---------------+---------+-----------+----------+-------------------+ POP                     Yes      Yes                  unable to compress                                                        due to pain                                                               tolerance           +---------+---------------+---------+-----------+----------+-------------------+ PTV                                                   unable to compress                                                        due to pain                                                               tolerance           +---------+---------------+---------+-----------+----------+-------------------+ PERO                                                  unable to compress                                                        due to pain  tolerance           +---------+---------------+---------+-----------+----------+-------------------+     Summary: Right: There is no evidence of deep vein thrombosis in the lower extremity. No cystic structure found in the popliteal fossa. Left: There is no evidence of deep vein thrombosis in the lower extremity. However, portions of this  examination were limited- see technologist comments above. No cystic structure found in the popliteal fossa.  *See table(s) above for measurements and observations. Electronically signed by Harold Barban MD on 01/12/2019 at 2:43:13 PM.    Final     Lab Data:  CBC: Recent Labs  Lab 01/17/19 0941 01/18/19 0304 01/19/19 1749 01/20/19 0227 01/21/19 0313 01/22/19 0309  WBC 15.3* 15.1* 10.8* 8.6 11.9* 10.4  NEUTROABS 12.0* 11.7* 7.0  --   --   --   HGB 8.8* 7.7* 7.4* 6.5* 7.7* 7.2*  HCT 28.7* 24.6* 23.7* 21.6* 24.4* 23.0*  MCV 73.4* 73.0* 72.0* 74.5* 76.0* 76.7*  PLT 183 249 242 197 167 449   Basic Metabolic Panel: Recent Labs  Lab 01/17/19 0941 01/18/19 0304 01/19/19 0311 01/20/19 0227 01/21/19 0313 01/21/19 1413 01/22/19 0309 01/23/19 0418  NA 148* 147* 145 146* 146* 140 142 139  K 4.1 3.7 3.4* 3.8 3.9 4.7 3.4* 4.5  CL 107 105 102 107 108 103 105 105  CO2 28 32 _0 GLUCOSE 66* 82 68* 45* 73 130* 85 65*  BUN 33* 31* 24* _1 CREATININE 3.03* 3.00* 3.01* 3.16* 3.71* 3.97* 3.75* 3.91*  CALCIUM 8.5* 8.3* 8.3* 7.9* 8.1* 7.9* 7.9* 7.8*  MG 1.8 1.6* 1.6* 1.5*  --   --  1.4*  --   PHOS 2.3* 2.4* 3.1  --   --   --   --   --    GFR: Estimated Creatinine Clearance: 14.7 mL/min (A) (by C-G formula based on SCr of 3.91 mg/dL (H)). Liver Function Tests: Recent Labs  Lab 01/17/19 0941 01/18/19 0304 01/19/19 0311 01/23/19 0418  AST 21 21 36 37  ALT _2 ALKPHOS 83 75 75 59  BILITOT 0.9 1.0 0.9 0.7  PROT 5.3* 4.9* 4.8* 4.4*  ALBUMIN 2.5* 2.4* 2.5* 2.6*   No results for input(s): LIPASE, AMYLASE in the last 168 hours. Recent Labs  Lab 01/17/19 1146  AMMONIA 14   Coagulation Profile: No results for input(s): INR, PROTIME in the last 168 hours. Cardiac Enzymes: No results for input(s): CKTOTAL, CKMB, CKMBINDEX, TROPONINI in the last 168 hours. BNP (last 3 results) No results for input(s): PROBNP in the last 8760 hours. HbA1C: No results  for input(s): HGBA1C in the last 72 hours. CBG: No results for input(s): GLUCAP in the last 168 hours. Lipid Profile: No results for input(s): CHOL, HDL, LDLCALC, TRIG, CHOLHDL, LDLDIRECT in the last 72 hours. Thyroid Function Tests: No results for input(s): TSH, T4TOTAL, FREET4, T3FREE, THYROIDAB in the last 72 hours. Anemia Panel: No results for input(s): VITAMINB12, FOLATE, FERRITIN, TIBC, IRON, RETICCTPCT in the last 72 hours. Urine analysis:    Component Value Date/Time   COLORURINE YELLOW 01/16/2019 [place] 01/16/2019 1757   LABSPEC 1.010 01/16/2019 1757   PHURINE 7.0 01/16/2019 [place] 01/16/2019 1757   HGBUR NEGATIVE 01/16/2019 1757   BILIRUBINUR NEGATIVE 01/16/2019 1757   KETONESUR NEGATIVE 01/16/2019 1757   PROTEINUR NEGATIVE 01/16/2019 1757   NITRITE NEGATIVE 01/16/2019 1757   LEUKOCYTESUR NEGATIVE 01/16/2019 1757  Benito Mccreedy M.D. [facility] 01/23/2019, 11:21 AM  Pager: [phone] Between 7am to 7pm - call Pager - [phone]  After 7pm go to [email or web address] - password [email or web address]  Call night coverage person covering after 7pm

## 2019-01-24 LAB — COMPREHENSIVE METABOLIC PANEL
ALT: 23 U/L (ref 0–44)
AST: 31 U/L (ref 15–41)
Albumin: 2.8 g/dL — ABNORMAL LOW (ref 3.5–5.0)
Alkaline Phosphatase: 61 U/L (ref 38–126)
Anion gap: 11 (ref 5–15)
BUN: 10 mg/dL (ref 8–23)
CO2: 23 mmol/L (ref 22–32)
Calcium: 7.8 mg/dL — ABNORMAL LOW (ref 8.9–10.3)
Chloride: 101 mmol/L (ref 98–111)
Creatinine, Ser: 3.3 mg/dL — ABNORMAL HIGH (ref 0.44–1.00)
GFR calc Af Amer: 17 mL/min — ABNORMAL LOW (ref >60)
GFR calc non Af Amer: 14 mL/min — ABNORMAL LOW (ref >60)
Glucose, Bld: 66 mg/dL — ABNORMAL LOW (ref 70–99)
Potassium: 3.6 mmol/L (ref 3.5–5.1)
Sodium: 135 mmol/L (ref 135–145)
Total Bilirubin: 0.7 mg/dL (ref 0.3–1.2)
Total Protein: 4.9 g/dL — ABNORMAL LOW (ref 6.5–8.1)

## 2019-01-24 LAB — CBC
HCT: 25.5 % — ABNORMAL LOW (ref 36.0–46.0)
Hemoglobin: 7.9 g/dL — ABNORMAL LOW (ref 12.0–15.0)
MCH: 24.6 pg — ABNORMAL LOW (ref 26.0–34.0)
MCHC: 31 g/dL (ref 30.0–36.0)
MCV: 79.4 fL — ABNORMAL LOW (ref 80.0–100.0)
Platelets: 194 10*3/uL (ref 150–400)
RBC: 3.21 MIL/uL — ABNORMAL LOW (ref 3.87–5.11)
RDW: 20.7 % — ABNORMAL HIGH (ref 11.5–15.5)
WBC: 7.4 10*3/uL (ref 4.0–10.5)
nRBC: 6.6 % — ABNORMAL HIGH (ref 0.0–0.2)

## 2019-01-24 LAB — PHOSPHORUS: Phosphorus: 3.2 mg/dL (ref 2.5–4.6)

## 2019-01-24 LAB — MAGNESIUM: Magnesium: 1.5 mg/dL — ABNORMAL LOW (ref 1.7–2.4)

## 2019-01-24 MED ORDER — LOPERAMIDE HCL 2 MG PO CAPS
4.0000 mg | ORAL_CAPSULE | Freq: Four times a day (QID) | ORAL | Status: DC | PRN
  Administered 2019-01-24: 4 mg via ORAL
  Filled 2019-01-24: qty 2

## 2019-01-24 NOTE — Progress Notes (Signed)
Pt had diarrhea x 3 since 1900. Denied abdominal pain, no fever, no stool odor. Was on laxative previously and she had mushy stools for past 2 days. Last Miralax given 01/22/19.  Rai, MD paged. No new orders at this time. Will continue to monitor patient.

## 2019-01-24 NOTE — Progress Notes (Signed)
Physical Therapy Treatment Patient Details Name: Carolyn Zamora MRN: [id number] DOB: [birth year] Today's Date: 01/24/2019    History of Present Illness Pt is a 63 y/o F admitted on 01/10/19 for feet pain x 5 days & worsening renal function. Pt found to have sepsis of unknown etiology. Pt developed seizures. PMH significant for renal cell carcinoma s/p L nephrectomy, splenectomy, CKD stage 3, chronic pain syndrome, morbid obesity, lupus on chronic immunosuppressive therapy, HTN, anemia.    PT Comments    Pt was seen for walk in her room with O2 sats ck pregait of 98% and post gait 84% but on room air.  Recovered to 98% with a short rest, reported to nursing.  Pt is getting up and moving fairly well, but will need HHPT for follow up of sats and to monitor for safety and tolerance of activity.    Follow Up Recommendations  Home health PT;Supervision - Intermittent     Equipment Recommendations  None recommended by PT    Recommendations for Other Services       Precautions / Restrictions Precautions Precautions: Fall Precaution Comments: ck O2 sats Restrictions Weight Bearing Restrictions: No    Mobility  Bed Mobility Overal bed mobility: Needs Assistance Bed Mobility: Supine to Sit;Sit to Supine     Supine to sit: Min assist Sit to supine: Min assist   General bed mobility comments: min assist to support trunk out and min to return to bed with legs  Transfers Overall transfer level: Needs assistance Equipment used: Rolling walker (2 wheeled);1 person hand held assist Transfers: Sit to/from Stand Sit to Stand: Min guard            Ambulation/Gait Ambulation/Gait assistance: Min guard Gait Distance (Feet): 30 Feet Assistive device: Rolling walker (2 wheeled) Gait Pattern/deviations: Step-through pattern;Shuffle;Decreased stride length;Wide base of support Gait velocity: decr Gait velocity interpretation: <1.31 ft/sec, indicative of household ambulator General  Gait Details: supervised for safety but min guard due to weak feelings from pt over her GI issues   Stairs Stairs: (none at home)           Wheelchair Mobility    Modified Rankin (Stroke Patients Only)       Balance   Sitting-balance support: Feet supported Sitting balance-Leahy Scale: Good     Standing balance support: During functional activity;Bilateral upper extremity supported Standing balance-Leahy Scale: Fair                              Cognition Arousal/Alertness: Awake/alert Behavior During Therapy: WFL for tasks assessed/performed Overall Cognitive Status: Within Functional Limits for tasks assessed Area of Impairment: Awareness;Problem solving                     Memory: Decreased recall of precautions;Decreased short-term memory   Safety/Judgement: Decreased awareness of deficits Awareness: Intellectual Problem Solving: Slow processing;Requires verbal cues General Comments: pt is not concerned about any limitations, talking about being released with minimal time OOB during the day      Exercises      General Comments General comments (skin integrity, edema, etc.): pt attempted to walk and needed to stop with GI symptoms, but afterward walks a short trip due to feeling she will need to use BSC unpredictably      Pertinent Vitals/Pain Pain Assessment: No/denies pain    Home Living Family/patient expects to be discharged to:: Private residence Living Arrangements: Alone Available Help at Discharge: Available 24  hours/day;Family(until first of the year) Type of Home: House Home Access: Level entry   Home Layout: One level Home Equipment: [place] - 4 wheels;Cane - single point Additional Comments: Pt is potentially going to need O2    Prior Function Level of Independence: Independent with assistive device(s)      Comments: pt uses rollator in the home PRN 2/2 gout pain   PT Goals (current goals can now be found in the  care plan section) Acute Rehab PT Goals Patient Stated Goal: discharge to home with daughter    Frequency    Min 3X/week      PT Plan      Co-evaluation              AM-PAC PT "6 Clicks" Mobility   Outcome Measure  Help needed turning from your back to your side while in a flat bed without using bedrails?: None Help needed moving from lying on your back to sitting on the side of a flat bed without using bedrails?: A Little Help needed moving to and from a bed to a chair (including a wheelchair)?: A Little Help needed standing up from a chair using your arms (e.g., wheelchair or bedside chair)?: A Little Help needed to walk in hospital room?: A Little Help needed climbing 3-5 steps with a railing? : A Lot 6 Click Score: 18    End of Session Equipment Utilized During Treatment: Gait belt Activity Tolerance: Treatment limited secondary to medical complications (Comment) Patient left: in bed;with call bell/phone within reach;with bed alarm set Nurse Communication: Mobility status PT Visit Diagnosis: Unsteadiness on feet (R26.81);Difficulty in walking, not elsewhere classified (R26.2)     Time: 6834-1962 PT Time Calculation (min) (ACUTE ONLY): 30 min  Charges:  $Gait Training: 8-22 mins $Therapeutic Exercise: 8-22 mins                    Ramond Dial 01/24/2019, 1:11 PM   Mee Hives, PT MS Acute Rehab Dept. Number: [place]

## 2019-01-24 NOTE — Progress Notes (Signed)
[facility] NEPHROLOGY PROGRESS NOTE  Assessment/ Plan: Pt is a 63 y.o. yo female solitary kidney, CKD stage IV with b/l cr around 2.8 follows with nephrologist at [facility], lupus, UTI and sepsis.  #AKI on CKD stage IV in a patient with solitary kidney: Thought to be ATN in the setting of UTI/sepsis.  US renal with unremarkable right kidney, absent left kidney.  Creatinine level improved to 3.3 today with IVF.  Encourage oral intake.  I will discontinue IVF today.  Urine output marginal probably because of incontinence.  Patient is eager to go home today.  I recommend her to follow-up with her nephrologist at [facility].  #UTI with sepsis: On ceftriaxone.  #SLE: Currently on CellCept, hydroxychloroquine and recommend to change to home dose of prednisone on discharge.  Follows with rheumatology and nephrology.  #Hyponatremia: Improved now.  Nothing further to add.  Call back with question.  She will follow up with her nephrologist at [facility].  Subjective: Seen and examined.  Reports feeling much better except few loose BM.  Very eager to go home today and understands to follow-up with her nephrologist.  Denied nausea, vomiting, chest pain, shortness of breath.  Objective Vital signs in last 24 hours: Vitals:   01/24/19 0100 01/24/19 0419 01/24/19 0722 01/24/19 0800  BP: (!) 146/77 139/69 (!) 149/91   Pulse: 91 94 (!) 111 93  Resp: 20 20 (!) 22 (!) 24  Temp:  99 F (37.2 C) 98.1 F (36.7 C)   TempSrc:  Oral Oral   SpO2: 100% 100% 99% 100%  Weight:  81.7 kg    Height:       Weight change: 0.7 kg  Intake/Output Summary (Last 24 hours) at 01/24/2019 1005 Last data filed at 01/24/2019 0600 Gross per 24 hour  Intake 779.33 ml  Output 307 ml  Net 472.33 ml       Labs: Basic Metabolic Panel: Recent Labs  Lab 01/18/19 0304 01/19/19 0311 01/22/19 0309 01/23/19 0418 01/24/19 0845  NA 147* 145 142 139 135  K 3.7 3.4* 3.4* 4.5 3.6  CL 105 102 105 105 101  CO2 32 30 26  23 23   GLUCOSE 82 68* 85 65* 66*  BUN 31* 24* 16 13 10   CREATININE 3.00* 3.01* 3.75* 3.91* 3.30*  CALCIUM 8.3* 8.3* 7.9* 7.8* 7.8*  PHOS 2.4* 3.1  --   --   --    Liver Function Tests: Recent Labs  Lab 01/19/19 0311 01/23/19 0418 01/24/19 0845  AST 36 37 31  ALT 22 21 23   ALKPHOS 75 59 61  BILITOT 0.9 0.7 0.7  PROT 4.8* 4.4* 4.9*  ALBUMIN 2.5* 2.6* 2.8*   No results for input(s): LIPASE, AMYLASE in the last 168 hours. Recent Labs  Lab 01/17/19 1146  AMMONIA 14   CBC: Recent Labs  Lab 01/18/19 0304 01/19/19 0311 01/20/19 0227 01/21/19 0313 01/22/19 0309 01/24/19 0845  WBC 15.1* 10.8* 8.6 11.9* 10.4 7.4  NEUTROABS 11.7* 7.0  --   --   --   --   HGB 7.7* 7.4* 6.5* 7.7* 7.2* 7.9*  HCT 24.6* 23.7* 21.6* 24.4* 23.0* 25.5*  MCV 73.0* 72.0* 74.5* 76.0* 76.7* 79.4*  PLT 249 242 197 167 176 194   Cardiac Enzymes: No results for input(s): CKTOTAL, CKMB, CKMBINDEX, TROPONINI in the last 168 hours. CBG: No results for input(s): GLUCAP in the last 168 hours.  Iron Studies: No results for input(s): IRON, TIBC, TRANSFERRIN, FERRITIN in the last 72 hours. Studies/Results:  No results found.  Medications: Infusions: . cefTRIAXone (ROCEPHIN)  IV 1 g (01/23/19 1605)  . dextrose 5 % and 0.45% NaCl 100 mL/hr at 01/24/19 6244    Scheduled Medications: . amLODipine  10 mg Oral Daily  . Chlorhexidine Gluconate Cloth  6 each Topical Daily  . darbepoetin (ARANESP) injection - NON-DIALYSIS  150 mcg Subcutaneous Q Fri-1800  . DULoxetine  60 mg Oral BID  . heparin injection (subcutaneous)  5,000 Units Subcutaneous Q8H  . hydroxychloroquine  200 mg Oral BID  . levothyroxine  25 mcg Oral Q0600  . lidocaine  1 patch Transdermal Q24H  . magnesium oxide  400 mg Oral BID  . mycophenolate  500 mg Oral q morning - 10a  . pantoprazole  40 mg Oral Daily  . phenytoin  300 mg Oral QHS  . polyethylene glycol  17 g Oral BID  . predniSONE  20 mg Oral Q breakfast  . senna-docusate  1 tablet  Oral BID    have reviewed scheduled and prn medications.  Physical Exam: General:NAD, comfortable Heart:RRR, s1s2 nl Lungs:clear b/l, no crackle Abdomen:soft, Non-tender, non-distended Extremities:No edema Neurology: Alert awake and following commands  Numan Zylstra Tanna Furry 01/24/2019,10:05 AM  LOS: 13 days  Pager: [phone]

## 2019-01-24 NOTE — Discharge Summary (Signed)
Physician Discharge Summary  Carolyn Zamora EUM:[id number] DOB: 1955-05-27 DOA: 01/10/2019  PCP: Beckie Salts, MD  Admit date: 01/10/2019 Discharge date: 01/24/2019  Admitted From: Home Disposition: Home  Recommendations for Outpatient Follow-up:  1. Follow up with PCP in 1-2 weeks 2. Please obtain BMP/CBC in one week your next doctors visit.  3. Outpatient nephrology appointment in 2 days according to her.  Home Health: Declined Equipment/Devices: None Discharge Condition: Stable CODE STATUS: Full Diet recommendation: Renal  Brief/Interim Summary: 63 year old with history of renal cell carcinoma status post left nephrectomy, history of splenectomy, CKD stage III, chronic pain, lupus on chronic immunosuppressive therapy, history of gout presented with sepsis secondary to UTI and worsening renal function.  Nephrology team was consulted suspected ATN.  There was also signs of confusion therefore neurology consulted who performed clinical work-up and thought this was likely metabolic encephalopathy.  Sepsis physiology resolved, completed course of IV Rocephin in the hospital.  Mentation at baseline.  Renal function was also improving quite a bit.  Nephrology recommended outpatient follow-up with her nephrologist. Patient really wants to go home today.  Declined home health services.  We will discharge her today in stable condition.   Discharge Diagnoses:  Principal Problem:   Sepsis ([place]) Active Problems:   Hypertension   Systemic lupus erythematosus (HCC)   Chronic pain syndrome   Renal cell carcinoma of left kidney s/p nephrectomy   Anemia of chronic disease   Renal failure (ARF), acute on chronic (HCC)   Pain in both feet   Hypomagnesemia  Sepsis secondary to urinary tract infection, present on admission -Sepsis physiology has improved.  Blood cultures now remain negative.  CT of the abdomen pelvis 12/5 showed small left-sided pleural effusion with mild right-sided hydro  and perinephric stranding. -Has been on IV Rocephin since 12/6.  No longer needs antibiotics at discharge -Culture data-remains negative  Acute kidney injury, stabilized ATN, likely new baseline around 3.8 -Unsure about exact etiology.  Creatinine appears to have stabilized around 3.8.  Suspicion of ATN.  Likely her new baseline. -Renal ultrasound-negative. -Nephrology team following-recommend outpatient follow-up with her primary nephrologist.  Patient has an appointment with him in 2 days  Metabolic encephalopathy, resolved -Ammonia levels normal.  CT head-negative -EEG-showed severe encephalopathy -Seen by neurology.  Obvious clinical seizures.  Recommended fosphenytoin, now transitioned to Dilantin 300 mg at bedtime  Bilateral foot pain with tophus gout, improved -Uric acid mildly elevated.  Dopplers negative -Transition back to prednisone 5 mg daily for her lupus -On Norco duloxetine  History of lupus -Not an active flare.    Transition back to prednisone 5 mg daily -Continue hydroxychloroquine 200 mg twice daily  Anemia of chronic disease/microcytic anemia -Status post transfusion.  Continue to monitor hemoglobin  Essential hypertension -Norvasc 10 mg daily  Depression/anxiety -Continue Cymbalta  Hypothyroidism -Synthroid 25 mcg daily  GERD -PPI  Chronic pain -Norco/duloxetine  Obesity with BMI greater than 30 -Diet weight loss and exercise  Patient declined any home health services and eventually would like to go home upon discharge with self-care.  Does have family support.  Consultations:  Nephrology  Subjective: Feels better, very adamant about going home today.  She is doing well mentating well.  No other complaints.  Discharge Exam: Vitals:   01/24/19 0800 01/24/19 1106  BP:  (!) 137/53  Pulse: 93 100  Resp: (!) 24 20  Temp:    SpO2: 100% 100%   Vitals:   01/24/19 0419 01/24/19 0722 01/24/19 0800 01/24/19 1106  BP: 139/69 (!)  149/91  (!) 137/53  Pulse: 94 (!) 111 93 100  Resp: 20 (!) 22 (!) 24 20  Temp: 99 F (37.2 C) 98.1 F (36.7 C)    TempSrc: Oral Oral    SpO2: 100% 99% 100% 100%  Weight: 81.7 kg     Height:        General: Pt is alert, awake, not in acute distress Cardiovascular: RRR, S1/S2 +, no rubs, no gallops Respiratory: CTA bilaterally, no wheezing, no rhonchi Abdominal: Soft, NT, ND, bowel sounds + Extremities: no edema, no cyanosis  Discharge Instructions   Allergies as of 01/24/2019   No Known Allergies     Medication List    STOP taking these medications   lidocaine 5 % Commonly known as: LIDODERM     TAKE these medications   amLODipine 10 MG tablet Commonly known as: NORVASC Take 10 mg by mouth daily.   cyanocobalamin 1000 MCG/ML injection Commonly known as: (VITAMIN B-12) Inject 1 mL into the muscle every 30 (thirty) days. Inject around the 15th.   dicyclomine 20 MG tablet Commonly known as: BENTYL Take 1 tablet (20 mg total) by mouth 2 (two) times daily as needed for spasms.   DULoxetine 60 MG capsule Commonly known as: CYMBALTA Take 60 mg by mouth 2 (two) times daily.   furosemide 40 MG tablet Commonly known as: LASIX Take 40 mg by mouth daily.   HYDROcodone-acetaminophen 5-325 MG tablet Commonly known as: NORCO/VICODIN Take 1 tablet by mouth every 8 (eight) hours as needed for pain.   hydroxychloroquine 200 MG tablet Commonly known as: PLAQUENIL Take 200 mg by mouth daily.   hydrOXYzine 50 MG tablet Commonly known as: ATARAX/VISTARIL Take 50 mg by mouth every morning.   levothyroxine 25 MCG tablet Commonly known as: SYNTHROID Take 25 mcg by mouth daily.   mycophenolate 500 MG tablet Commonly known as: CELLCEPT Take 500 mg by mouth every morning.   pantoprazole 40 MG tablet Commonly known as: PROTONIX Take 40 mg by mouth 2 (two) times daily.   predniSONE 5 MG tablet Commonly known as: DELTASONE Take 5 mg by mouth daily.   pregabalin 75 MG  capsule Commonly known as: LYRICA Take 75 mg by mouth 2 (two) times daily.   tiZANidine 4 MG tablet Commonly known as: ZANAFLEX Take 2 mg by mouth 2 (two) times daily as needed for muscle spasms. 1 to 2 times a day   traZODone 50 MG tablet Commonly known as: DESYREL Take 50 mg by mouth daily.   Vitamin D (Ergocalciferol) 1.25 MG (50000 UT) Caps capsule Commonly known as: DRISDOL Take 50,000 Units by mouth every Monday.      Follow-up Information    Beckie Salts, MD. Schedule an appointment as soon as possible for a visit in 1 week(s).   Specialty: Internal Medicine Contact information: [address] [place] [address] [phone]          No Known Allergies  You were cared for by a hospitalist during your hospital stay. If you have any questions about your discharge medications or the care you received while you were in the hospital after you are discharged, you can call the unit and asked to speak with the hospitalist on call if the hospitalist that took care of you is not available. Once you are discharged, your primary care physician will handle any further medical issues. Please note that no refills for any discharge medications will  be authorized once you are discharged, as it is imperative that you return to your primary care physician (or establish a relationship with a primary care physician if you do not have one) for your aftercare needs so that they can reassess your need for medications and monitor your lab values.   Procedures/Studies: EEG  Result Date: 01/16/2019 Alexis Goodell, MD     01/16/2019  4:06 PM ELECTROENCEPHALOGRAM REPORT Patient: Jenet Durio       Room #: 9X58I EEG No. ID: [id number] Age: 63 y.o.        Sex: female Referring Physician: Alfredia Ferguson Report Date:  01/16/2019       Interpreting Physician: Alexis Goodell History: Jemeka Wagler is an 63 y.o. female with sepsis Medications: Aranesp,  Cymbalta, Plaquenil, Synthroid, Deltasone Conditions of Recording:  This is a 21 channel routine scalp EEG performed with bipolar and monopolar montages arranged in accordance to the international 10/20 system of electrode placement. One channel was dedicated to EKG recording. The patient is in the altered state. Description:  The background activity is slow and disorganized.  This slow and poorly organized background is diffusely distributed.  There are also frequent diffusely distributed periodic discharges of triphasic morphology seen throughout the recording.  Hyperventilation and intermittent photic stimulation were not performed. IMPRESSION: This is a markedly abnormal electroencephalogram secondary to a slow and disorganized background with frequent diffusely distributed triphasic waves.  This finding is consistent with a severe encephalopathy, etiologically nonspecific.  Alexis Goodell, MD Neurology [phone] 01/16/2019, 4:00 PM   CT ABDOMEN PELVIS WO CONTRAST  Result Date: 01/15/2019 CLINICAL DATA:  63 year old female with abdominal distention, nausea vomiting. EXAM: CT ABDOMEN AND PELVIS WITHOUT CONTRAST TECHNIQUE: Multidetector CT imaging of the abdomen and pelvis was performed following the standard protocol without IV contrast. COMPARISON:  CT abdomen pelvis dated 01/12/2019. FINDINGS: Evaluation of this exam is limited in the absence of intravenous contrast. Evaluation is also limited due to streak artifact caused by patient's arms. Lower chest: Partially visualized small left pleural effusion with left lung base partial atelectasis versus infiltrate. Clinical correlation is recommended. There is a 7 mm subpleural nodule at the right lung base (series 5 image 28) similar to the study of 2019. There is hypoattenuation of the cardiac blood pool suggestive of a degree of anemia. Clinical correlation is recommended. No intra-abdominal free air or free fluid. Hepatobiliary: The liver is grossly  unremarkable. Minimal layering sludge may be present within the gallbladder. No pericholecystic fluid or evidence of acute cholecystitis by CT. Pancreas: The pancreas is mildly atrophic. No active inflammatory changes. 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 Adrenals/Urinary Tract: Status post prior left nephrectomy. There is mild right hydronephrosis. Right perinephric stranding noted. Correlation with urinalysis recommended to exclude UTI. No obstructing stone identified. The urinary bladder is grossly unremarkable. Stomach/Bowel: There is no bowel obstruction or active inflammation. The appendix is normal. Vascular/Lymphatic: Moderate aortoiliac atherosclerotic disease. The IVC is grossly unremarkable. No portal venous gas. There is no adenopathy. Reproductive: Probable partially calcified exophytic fibroid from the uterine fundus. No adnexal masses. Other: Mild diffuse subcutaneous edema. No drainable fluid collection. Musculoskeletal: Avascular necrosis of the femoral heads bilaterally. There is irregularity of the right femoral head consistent with subcortical collapse, chronic. Severe degenerative changes at L4-L5 and L5-S1. There is grade 2 L4-L5 anterolisthesis and grade 1 L5-S1 retrolisthesis. There is associated moderate narrowing of the central canal at this level. No acute osseous pathology. IMPRESSION: 1. Small left pleural effusion with  left lung base partial atelectasis versus infiltrate. Clinical correlation is recommended. 2. Mild right hydronephrosis, new since the prior CT. There is right perinephric stranding. Correlation with urinalysis recommended to exclude UTI. 3. No bowel obstruction or active inflammation. Normal appendix. 4. Aortic Atherosclerosis (ICD10-I70.0). 5. Lower lumbar listhesis and degenerative changes with associated narrowing of the central canal noted Electronically Signed   By: Anner Crete M.D.   On: 01/15/2019 22:48   CT ABDOMEN  PELVIS WO CONTRAST  Result Date: 01/12/2019 CLINICAL DATA:  Hydronephrosis, history of CKD EXAM: CT ABDOMEN AND PELVIS WITHOUT CONTRAST TECHNIQUE: Multidetector CT imaging of the abdomen and pelvis was performed following the standard protocol without IV contrast. COMPARISON:  Renal ultrasound January 11, 2019 CT October 22, 2018 FINDINGS: Lower chest: The visualized heart size within normal limits. No pericardial fluid/thickening. No hiatal hernia. There is patchy area of posterior consolidation seen at both lung bases. Hepatobiliary: Although limited due to the lack of intravenous contrast, normal in appearance without gross focal abnormality. Mildly dilated fluid-filled gallbladder. No calcified gallstones. Pancreas:  Unremarkable.  No surrounding inflammatory changes. Spleen: A small amount of splenic tissue seen within the left upper quadrant. Adrenals/Urinary Tract: Both adrenal glands appear normal. Again noted is a left-sided adrenal nephrectomy. There is mild right-sided perinephric stranding and proximal periureteral stranding. No right-sided renal, collecting system, or bladder calculi however are noted. Stomach/Bowel: The stomach, small bowel, and colon are normal in appearance. No inflammatory changes or obstructive findings. Vascular/Lymphatic: There are no enlarged abdominal or pelvic lymph nodes. Scattered aortic atherosclerotic calcifications are seen without aneurysmal dilatation. Reproductive: Again noted is a partially exophytic right posterior fibroid with coarse calcifications. Other: No evidence of abdominal wall mass or hernia. Musculoskeletal: No acute or significant osseous findings. Again noted is a grade 2 anterolisthesis of L4 on L5 with endplate irregularity and collapse of the anterior superior endplate of L5, and also fragmentation of the posteroinferior L4 vertebral body. This is seen dating back to MRI of 2019. 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 IMPRESSION: 1.  Patchy airspace opacity seen at both lung bases which is non-specific could be related to early infectious etiology and/or atelectasis. 2. Mild inflammatory changes surrounding the right kidney which could be due to pyelonephritis. No right-sided renal or collecting system calculi. 3.  Aortic Atherosclerosis (ICD10-I70.0). Electronically Signed   By: Prudencio Pair M.D.   On: 01/12/2019 02:14   DG Abd 1 View  Result Date: 01/15/2019 CLINICAL DATA:  Abdominal pain. EXAM: ABDOMEN - 1 VIEW COMPARISON:  CT scan January 15, 2019 FINDINGS: The lung bases are unremarkable. No free air, portal venous gas, or pneumatosis. Contrast is seen throughout the colon. No evidence of bowel obstruction. No other acute abnormalities identified. IMPRESSION: No acute abnormality seen.  No evidence of bowel obstruction. Electronically Signed   By: Dorise Bullion III M.D   On: 01/15/2019 22:58   CT HEAD WO CONTRAST  Result Date: 01/16/2019 CLINICAL DATA:  Unexplained altered level of consciousness. EXAM: CT HEAD WITHOUT CONTRAST TECHNIQUE: Contiguous axial images were obtained from the base of the skull through the vertex without intravenous contrast. COMPARISON:  11/05/2015 FINDINGS: Brain: Some motion degradation. Mild atrophy as seen previously. No sign of acute infarction, mass lesion, hemorrhage, hydrocephalus or extra-axial collection. Vascular: There is atherosclerotic calcification of the major vessels at the base of the brain. Skull: Negative Sinuses/Orbits: Clear/normal Other: None IMPRESSION: No acute finding.  Mild atrophy. Electronically Signed   By: Nelson Chimes  M.D.   On: 01/16/2019 13:42   MR BRAIN WO CONTRAST  Result Date: 01/16/2019 CLINICAL DATA:  63 year old female with unexplained altered mental status. History of renal cell carcinoma status post nephrectomy, and lupus on chronic immunosuppressive therapy. EXAM: MRI HEAD WITHOUT CONTRAST TECHNIQUE: Multiplanar, multiecho pulse sequences of the brain and  surrounding structures were obtained without intravenous contrast. COMPARISON:  Head CT earlier today. [facility]. Brain MRI 07/27/2014 FINDINGS: Brain: Generalized cerebral volume loss since 2016, mild-to-moderate. No restricted diffusion to suggest acute infarction. No midline shift, mass effect, evidence of mass lesion, ventriculomegaly, extra-axial collection or acute intracranial hemorrhage. Cervicomedullary junction and pituitary are within normal limits. Scattered mostly subcortical white matter T2 and FLAIR hyperintensity is not significantly changed since 2016. No superimposed cortical encephalomalacia or chronic cerebral blood products. Deep gray matter nuclei, brainstem and cerebellum appear negative. Vascular: Major intracranial vascular flow voids are stable since 2016. Chronic generalized intracranial artery tortuosity. Skull and upper cervical spine: Normal visible cervical spine. Visualized bone marrow signal is within normal limits. Sinuses/Orbits: Negative orbits. Trace paranasal sinus mucosal thickening is stable. Other: Trace left mastoid effusion Right mastoids has regressed remain clear. Negative visible internal auditory structures. Scalp and face soft tissues appear negative. IMPRESSION: 1. No acute intracranial abnormality. 2. Stable white matter signal changes since 2016, nonspecific but most commonly due to chronic small vessel disease. 3. Mild to moderate generalized cerebral volume loss since 2006. Electronically Signed   By: Genevie Ann M.D.   On: 01/16/2019 14:50   US RENAL  Result Date: 01/20/2019 CLINICAL DATA:  Renal failure, history of left nephrectomy EXAM: RENAL / URINARY TRACT ULTRASOUND COMPLETE COMPARISON:  01/11/2019 FINDINGS: Right Kidney: Renal measurements: 11.1 x 4.9 x 5.3 cm = volume: 152 mL . Echogenicity within normal limits. There is a 1.6 cm cyst at the upper pole. No new mass or hydronephrosis visualized. Left Kidney:  Absent Bladder: Decompressed by Foley catheter. Other: None. IMPRESSION: No hydronephrosis. Electronically Signed   By: Macy Mis M.D.   On: 01/20/2019 10:22   US RENAL  Result Date: 01/11/2019 CLINICAL DATA:  63 year old female with worsening renal function. Prior left nephrectomy. EXAM: RENAL / URINARY TRACT ULTRASOUND COMPLETE COMPARISON:  CT abdomen pelvis dated 01/25/2018 and renal ultrasound dated 12/16/2017. FINDINGS: Right Kidney: Renal measurements: 11.1 x 5.6 x 5.8 cm = volume: 191 mL. Mild increased renal echogenicity. No hydronephrosis or shadowing stone. There is a 1.6 x 1.7 x 1.8 cm hypoechoic lesion in the upper pole of the right kidney which is suboptimally characterized, possibly a cyst. Left Kidney: Prior nephrectomy. Bladder: Appears normal for degree of bladder distention. Other: None. IMPRESSION: Mild increased renal echogenicity. No hydronephrosis or shadowing stone. Electronically Signed   By: Anner Crete M.D.   On: 01/11/2019 22:48   DG Chest Port 1 View  Result Date: 01/10/2019 CLINICAL DATA:  Body aches and fevers. EXAM: PORTABLE CHEST 1 VIEW COMPARISON:  July 31, 2017 FINDINGS: The heart size is enlarged. There is no pneumothorax. No large pleural effusion. There is likely atelectasis at the lung bases. Aortic calcifications are noted. IMPRESSION: 1. Cardiomegaly without edema or pleural effusion. 2. Bibasilar atelectasis. 3. Aortic atherosclerosis. Electronically Signed   By: Constance Holster M.D.   On: 01/10/2019 16:52   DG Foot Complete Right  Result Date: 01/10/2019 CLINICAL DATA:  Foot pain pain EXAM: RIGHT FOOT COMPLETE - 3+ VIEW COMPARISON:  11/16/2015. FINDINGS: There are advanced degenerative changes of the first  metatarsophalangeal joint. There is osteopenia. There is no acute displaced fracture. No dislocation. The patient is status post prior ORIF of the ankle. Vascular calcifications are noted. IMPRESSION: No acute osseous abnormality. Electronically  Signed   By: Constance Holster M.D.   On: 01/10/2019 15:09   Overnight EEG with video  Result Date: 01/19/2019 Lora Havens, MD     01/20/2019  8:49 AM Patient Name: Anael Rosch MRN: [id number] Epilepsy Attending: Lora Havens Referring Physician/Provider: Dr. Kerney Elbe Duration:01/18/2019 1359 to 01/19/2019 1359 Patient history: 63 year old female admitted for worsening renal function as well as concern for sepsis.  EEG obtained for altered mental status, to assess for seizures. Level of alertness: Awake/lethargic, sleep AEDs during EEG study: Phenytoin Technical aspects: This EEG study was done with scalp electrodes positioned according to the 10-20 International system of electrode placement. Electrical activity was acquired at a sampling rate of 500Hz  and reviewed with a high frequency filter of 70Hz  and a low frequency filter of 1Hz . EEG data were recorded continuously and digitally stored. Description: EEG showed continuous generalized 2-5 Hz theta-delta slowing.  Triphasic waves, generalized, maximal bifrontal, at 2-2.5Hz  were also seen, more frequent when patient was stimulated. Sharp transients were seen in left frontotemporal region. No clear posterior dominant rhythm was seen.  Hyperventilation and photic stimulation were not performed. Abnormality -Continuous slow, generalized -Triphasic waves, generalized, maximal bifrontal IMPRESSION: This study showed evidence of moderate to severe diffuse encephalopathy, nonspecific etiology but could be secondary to toxic-metabolic causes.  No seizures or definite epileptiform discharges were seen throughout the recording.   VAS [place] LOWER EXTREMITY VENOUS (DVT)  Result Date: 01/12/2019  Lower Venous Study Indications: Swelling, and Edema.  Limitations: Patient unable to tolerate compression. Comparison Study: no prior Performing Technologist: Abram Sander RVS  Examination Guidelines: A complete evaluation includes B-mode imaging, spectral  Doppler, color Doppler, and power Doppler as needed of all accessible portions of each vessel. Bilateral testing is considered an integral part of a complete examination. Limited examinations for reoccurring indications may be performed as noted.  +---------+---------------+---------+-----------+----------+--------------+ RIGHT    CompressibilityPhasicitySpontaneityPropertiesThrombus Aging +---------+---------------+---------+-----------+----------+--------------+ CFV      Full           Yes      Yes                                 +---------+---------------+---------+-----------+----------+--------------+ SFJ      Full                                                        +---------+---------------+---------+-----------+----------+--------------+ FV Prox  Full                                                        +---------+---------------+---------+-----------+----------+--------------+ FV Mid   Full                                                        +---------+---------------+---------+-----------+----------+--------------+  FV Distal               Yes      Yes                                 +---------+---------------+---------+-----------+----------+--------------+ PFV      Full                                                        +---------+---------------+---------+-----------+----------+--------------+ POP      Full           Yes      Yes                                 +---------+---------------+---------+-----------+----------+--------------+ PTV      Full                                                        +---------+---------------+---------+-----------+----------+--------------+ PERO     Full                                                        +---------+---------------+---------+-----------+----------+--------------+   +---------+---------------+---------+-----------+----------+-------------------+ LEFT      CompressibilityPhasicitySpontaneityPropertiesThrombus Aging      +---------+---------------+---------+-----------+----------+-------------------+ CFV      Full           Yes      Yes                                      +---------+---------------+---------+-----------+----------+-------------------+ SFJ      Full                                                             +---------+---------------+---------+-----------+----------+-------------------+ FV Prox  Full                                                             +---------+---------------+---------+-----------+----------+-------------------+ FV Mid                  Yes      Yes                  unable to compress  due to pain                                                               tolerance           +---------+---------------+---------+-----------+----------+-------------------+ FV Distal               Yes      Yes                  unable to compress                                                        due to pain                                                               tolerance           +---------+---------------+---------+-----------+----------+-------------------+ PFV      Full                                                             +---------+---------------+---------+-----------+----------+-------------------+ POP                     Yes      Yes                  unable to compress                                                        due to pain                                                               tolerance           +---------+---------------+---------+-----------+----------+-------------------+ PTV                                                   unable to compress  due to pain                                                                tolerance           +---------+---------------+---------+-----------+----------+-------------------+ PERO                                                  unable to compress                                                        due to pain                                                               tolerance           +---------+---------------+---------+-----------+----------+-------------------+     Summary: Right: There is no evidence of deep vein thrombosis in the lower extremity. No cystic structure found in the popliteal fossa. Left: There is no evidence of deep vein thrombosis in the lower extremity. However, portions of this examination were limited- see technologist comments above. No cystic structure found in the popliteal fossa.  *See table(s) above for measurements and observations. Electronically signed by Harold Barban MD on 01/12/2019 at 2:43:13 PM.    Final       The results of significant diagnostics from this hospitalization (including imaging, microbiology, ancillary and laboratory) are listed below for reference.     Microbiology: Recent Results (from the past 240 hour(s))  Culture, Urine     Status: None   Collection Time: 01/16/19  5:57 PM   Specimen: Urine, Random  Result Value Ref Range Status   Specimen Description URINE, RANDOM  Final   Special Requests NONE  Final   Culture   Final    NO GROWTH Performed at [facility], [address] [place], [place] [address]    Report Status 01/18/2019 FINAL  Final     Labs: BNP (last 3 results) No results for input(s): BNP in the last 8760 hours. Basic Metabolic Panel: Recent Labs  Lab 01/18/19 0304 01/19/19 0311 01/20/19 0227 01/21/19 0313 01/21/19 1413 01/22/19 0309 01/23/19 0418 01/24/19 0845  NA 147* 145 146* 146* 140 142 139 135  K 3.7 3.4* 3.8 3.9 4.7 3.4* 4.5 3.6  CL 105 102 107 108 103 105 105 101  CO2 32 30 25 28 26 26 23 23    GLUCOSE 82 68* 45* 73 130* 85 65* 66*  BUN 31* 24* 22 19 18 16 13 10   CREATININE 3.00* 3.01* 3.16* 3.71* 3.97* 3.75* 3.91* 3.30*  CALCIUM 8.3* 8.3* 7.9* 8.1* 7.9* 7.9* 7.8* 7.8*  MG 1.6* 1.6* 1.5*  --   --  1.4*  --  1.5*  PHOS 2.4* 3.1  --   --   --   --   --  3.2   Liver Function Tests: Recent Labs  Lab 01/18/19 0304 01/19/19 0311 01/23/19 0418 01/24/19 0845  AST 21 36 37 31  ALT 15 22 21 23   ALKPHOS 75 75 59 61  BILITOT 1.0 0.9 0.7 0.7  PROT 4.9* 4.8* 4.4* 4.9*  ALBUMIN 2.4* 2.5* 2.6* 2.8*   No results for input(s): LIPASE, AMYLASE in the last 168 hours. No results for input(s): AMMONIA in the last 168 hours. CBC: Recent Labs  Lab 01/18/19 0304 01/19/19 0311 01/20/19 0227 01/21/19 0313 01/22/19 0309 01/24/19 0845  WBC 15.1* 10.8* 8.6 11.9* 10.4 7.4  NEUTROABS 11.7* 7.0  --   --   --   --   HGB 7.7* 7.4* 6.5* 7.7* 7.2* 7.9*  HCT 24.6* 23.7* 21.6* 24.4* 23.0* 25.5*  MCV 73.0* 72.0* 74.5* 76.0* 76.7* 79.4*  PLT 249 242 197 167 176 194   Cardiac Enzymes: No results for input(s): CKTOTAL, CKMB, CKMBINDEX, TROPONINI in the last 168 hours. BNP: Invalid input(s): POCBNP CBG: No results for input(s): GLUCAP in the last 168 hours. D-Dimer No results for input(s): DDIMER in the last 72 hours. Hgb A1c No results for input(s): HGBA1C in the last 72 hours. Lipid Profile No results for input(s): CHOL, HDL, LDLCALC, TRIG, CHOLHDL, LDLDIRECT in the last 72 hours. Thyroid function studies No results for input(s): TSH, T4TOTAL, T3FREE, THYROIDAB in the last 72 hours.  Invalid input(s): FREET3 Anemia work up No results for input(s): VITAMINB12, FOLATE, FERRITIN, TIBC, IRON, RETICCTPCT in the last 72 hours. Urinalysis    Component Value Date/Time   COLORURINE YELLOW 01/16/2019 1757   APPEARANCEUR CLEAR 01/16/2019 1757   LABSPEC 1.010 01/16/2019 1757   PHURINE 7.0 01/16/2019 1757   GLUCOSEU NEGATIVE 01/16/2019 1757   HGBUR NEGATIVE 01/16/2019 1757   BILIRUBINUR  NEGATIVE 01/16/2019 1757   KETONESUR NEGATIVE 01/16/2019 1757   PROTEINUR NEGATIVE 01/16/2019 1757   NITRITE NEGATIVE 01/16/2019 1757   LEUKOCYTESUR NEGATIVE 01/16/2019 1757   Sepsis Labs Invalid input(s): PROCALCITONIN,  WBC,  LACTICIDVEN Microbiology Recent Results (from the past 240 hour(s))  Culture, Urine     Status: None   Collection Time: 01/16/19  5:57 PM   Specimen: Urine, Random  Result Value Ref Range Status   Specimen Description URINE, RANDOM  Final   Special Requests NONE  Final   Culture   Final    NO GROWTH Performed at [facility], [place] [address], [place], [place] [address]    Report Status 01/18/2019 FINAL  Final     Time coordinating discharge:  I have spent 35 minutes face to face with the patient and on the ward discussing the patients care, assessment, plan and disposition with other care givers. >50% of the time was devoted counseling the patient about the risks and benefits of treatment/Discharge disposition and coordinating care.   SIGNED:   Damita Lack, MD  [facility] 01/24/2019, 11:54 AM   If 7PM-7AM, please contact night-coverage

## 2019-01-24 NOTE — TOC Transition Note (Signed)
Transition of Care [facility]) - CM/SW Discharge Note   Patient Details  Name: Carolyn Zamora MRN: [id number] Date of Birth: 1955-03-17  Transition of Care [facility]) CM/SW Contact:  Sharin Mons, RN Phone Number: 01/24/2019, 12:33 PM   Clinical Narrative:   Admitted with sepsis 2/2UTI / worsening RF, hx of renal cell carcinoma s/p L nephrectomy, splenectomy, CKD stage 3, chronic pain syndrome, morbid obesity, lupus on chronic immunosuppressive therapy, HTN, anemia. Pt will transition to home today. [place] declines home health services. Pt states has transportation to home, daughter Gae Bon [phone]). No DME noted .  Final next level of care: Home/Self Care(PATIENT DECLINED [place]) Barriers to Discharge: No Barriers Identified   Patient Goals and CMS Choice Patient states their goals for this hospitalization and ongoing recovery are:: Return home with family support CMS Medicare.gov Compare Post Acute Care list provided to:: Patient Choice offered to / list presented to : Patient  Discharge Placement                       Discharge Plan and Services In-house Referral: Clinical Social Work Discharge Planning Services: CM Consult Post Acute Care Choice: NA          DME Arranged: N/A         HH Arranged: Refused HH, Refused SNF [place] Agency: NA        Social Determinants of Health (SDOH) Interventions     Readmission Risk Interventions Readmission Risk Prevention Plan 01/13/2019  Transportation Screening Complete  PCP or Specialist Appt within 3-5 Days Complete  HRI or [place] Complete  Social Work Consult for [place] Planning/Counseling Complete  Palliative Care Screening Not Applicable  Medication Review Press photographer) Referral to Pharmacy  Some recent data might be hidden

## 2019-01-25 LAB — TYPE AND SCREEN
ABO/RH(D): O NEG
Antibody Screen: NEGATIVE
PT AG Type: NEGATIVE
Unit division: 0
Unit division: 0
Unit division: 0
Unit division: 0
Unit division: 0
Unit division: 0

## 2019-01-25 LAB — BPAM RBC
Blood Product Expiration Date: 202012182359
Blood Product Expiration Date: 202012192359
Blood Product Expiration Date: 202012192359
Blood Product Expiration Date: 202012192359
Blood Product Expiration Date: 202012192359
Blood Product Expiration Date: 202012202359
ISSUE DATE / TIME: 202012101503
ISSUE DATE / TIME: 202012121616
ISSUE DATE / TIME: 202012140344
ISSUE DATE / TIME: 202012142142
ISSUE DATE / TIME: 202012142352
ISSUE DATE / TIME: 202012150348
Unit Type and Rh: 9500
Unit Type and Rh: 9500
Unit Type and Rh: 9500
Unit Type and Rh: 9500
Unit Type and Rh: 9500
Unit Type and Rh: 9500

## 2019-03-31 ENCOUNTER — Encounter: Payer: Medicare Other | Admitting: Obstetrics & Gynecology

## 2019-04-22 ENCOUNTER — Encounter: Payer: Medicare Other | Admitting: Obstetrics & Gynecology

## 2019-04-22 DIAGNOSIS — R87810 Cervical high risk human papillomavirus (HPV) DNA test positive: Secondary | ICD-10-CM

## 2019-07-21 ENCOUNTER — Other Ambulatory Visit: Payer: Self-pay

## 2019-07-21 ENCOUNTER — Emergency Department (HOSPITAL_BASED_OUTPATIENT_CLINIC_OR_DEPARTMENT_OTHER): Payer: Medicare Other

## 2019-07-21 ENCOUNTER — Inpatient Hospital Stay (HOSPITAL_BASED_OUTPATIENT_CLINIC_OR_DEPARTMENT_OTHER)
Admission: EM | Admit: 2019-07-21 | Discharge: 2019-07-25 | DRG: 690 | Disposition: A | Discharge status: Home Health | Payer: Medicare Other | Attending: Family Medicine | Admitting: Family Medicine

## 2019-07-21 ENCOUNTER — Encounter (HOSPITAL_BASED_OUTPATIENT_CLINIC_OR_DEPARTMENT_OTHER): Payer: Self-pay | Admitting: *Deleted

## 2019-07-21 DIAGNOSIS — M8700 Idiopathic aseptic necrosis of unspecified bone: Secondary | ICD-10-CM | POA: Diagnosis present

## 2019-07-21 DIAGNOSIS — R109 Unspecified abdominal pain: Secondary | ICD-10-CM | POA: Diagnosis present

## 2019-07-21 DIAGNOSIS — F419 Anxiety disorder, unspecified: Secondary | ICD-10-CM | POA: Diagnosis present

## 2019-07-21 DIAGNOSIS — N10 Acute tubulo-interstitial nephritis: Principal | ICD-10-CM | POA: Diagnosis present

## 2019-07-21 DIAGNOSIS — B9620 Unspecified Escherichia coli [E. coli] as the cause of diseases classified elsewhere: Secondary | ICD-10-CM | POA: Diagnosis present

## 2019-07-21 DIAGNOSIS — N3001 Acute cystitis with hematuria: Secondary | ICD-10-CM

## 2019-07-21 DIAGNOSIS — D509 Iron deficiency anemia, unspecified: Secondary | ICD-10-CM | POA: Diagnosis present

## 2019-07-21 DIAGNOSIS — E162 Hypoglycemia, unspecified: Secondary | ICD-10-CM | POA: Diagnosis present

## 2019-07-21 DIAGNOSIS — I10 Essential (primary) hypertension: Secondary | ICD-10-CM | POA: Diagnosis present

## 2019-07-21 DIAGNOSIS — E860 Dehydration: Secondary | ICD-10-CM | POA: Diagnosis present

## 2019-07-21 DIAGNOSIS — R1310 Dysphagia, unspecified: Secondary | ICD-10-CM | POA: Diagnosis present

## 2019-07-21 DIAGNOSIS — Z8249 Family history of ischemic heart disease and other diseases of the circulatory system: Secondary | ICD-10-CM

## 2019-07-21 DIAGNOSIS — Z7952 Long term (current) use of systemic steroids: Secondary | ICD-10-CM

## 2019-07-21 DIAGNOSIS — E669 Obesity, unspecified: Secondary | ICD-10-CM | POA: Diagnosis present

## 2019-07-21 DIAGNOSIS — K219 Gastro-esophageal reflux disease without esophagitis: Secondary | ICD-10-CM | POA: Diagnosis present

## 2019-07-21 DIAGNOSIS — N184 Chronic kidney disease, stage 4 (severe): Secondary | ICD-10-CM | POA: Diagnosis present

## 2019-07-21 DIAGNOSIS — N179 Acute kidney failure, unspecified: Secondary | ICD-10-CM | POA: Diagnosis present

## 2019-07-21 DIAGNOSIS — E039 Hypothyroidism, unspecified: Secondary | ICD-10-CM | POA: Diagnosis present

## 2019-07-21 DIAGNOSIS — G894 Chronic pain syndrome: Secondary | ICD-10-CM | POA: Diagnosis present

## 2019-07-21 DIAGNOSIS — K838 Other specified diseases of biliary tract: Secondary | ICD-10-CM | POA: Diagnosis present

## 2019-07-21 DIAGNOSIS — N390 Urinary tract infection, site not specified: Secondary | ICD-10-CM | POA: Diagnosis present

## 2019-07-21 DIAGNOSIS — E872 Acidosis: Secondary | ICD-10-CM | POA: Diagnosis present

## 2019-07-21 DIAGNOSIS — R1084 Generalized abdominal pain: Secondary | ICD-10-CM

## 2019-07-21 DIAGNOSIS — Z833 Family history of diabetes mellitus: Secondary | ICD-10-CM

## 2019-07-21 DIAGNOSIS — Z85528 Personal history of other malignant neoplasm of kidney: Secondary | ICD-10-CM

## 2019-07-21 DIAGNOSIS — R1011 Right upper quadrant pain: Secondary | ICD-10-CM

## 2019-07-21 DIAGNOSIS — D631 Anemia in chronic kidney disease: Secondary | ICD-10-CM | POA: Diagnosis present

## 2019-07-21 DIAGNOSIS — Z800 Family history of malignant neoplasm of digestive organs: Secondary | ICD-10-CM

## 2019-07-21 DIAGNOSIS — M329 Systemic lupus erythematosus, unspecified: Secondary | ICD-10-CM | POA: Diagnosis present

## 2019-07-21 DIAGNOSIS — Z20822 Contact with and (suspected) exposure to covid-19: Secondary | ICD-10-CM | POA: Diagnosis present

## 2019-07-21 DIAGNOSIS — R112 Nausea with vomiting, unspecified: Secondary | ICD-10-CM

## 2019-07-21 DIAGNOSIS — I129 Hypertensive chronic kidney disease with stage 1 through stage 4 chronic kidney disease, or unspecified chronic kidney disease: Secondary | ICD-10-CM | POA: Diagnosis present

## 2019-07-21 DIAGNOSIS — F329 Major depressive disorder, single episode, unspecified: Secondary | ICD-10-CM | POA: Diagnosis present

## 2019-07-21 DIAGNOSIS — C642 Malignant neoplasm of left kidney, except renal pelvis: Secondary | ICD-10-CM | POA: Diagnosis present

## 2019-07-21 DIAGNOSIS — Z905 Acquired absence of kidney: Secondary | ICD-10-CM

## 2019-07-21 DIAGNOSIS — R6510 Systemic inflammatory response syndrome (SIRS) of non-infectious origin without acute organ dysfunction: Secondary | ICD-10-CM | POA: Diagnosis present

## 2019-07-21 DIAGNOSIS — M25552 Pain in left hip: Secondary | ICD-10-CM

## 2019-07-21 DIAGNOSIS — Z79899 Other long term (current) drug therapy: Secondary | ICD-10-CM

## 2019-07-21 DIAGNOSIS — Z1629 Resistance to other single specified antibiotic: Secondary | ICD-10-CM | POA: Diagnosis present

## 2019-07-21 DIAGNOSIS — Z801 Family history of malignant neoplasm of trachea, bronchus and lung: Secondary | ICD-10-CM

## 2019-07-21 DIAGNOSIS — Z9081 Acquired absence of spleen: Secondary | ICD-10-CM

## 2019-07-21 DIAGNOSIS — M879 Osteonecrosis, unspecified: Secondary | ICD-10-CM | POA: Diagnosis present

## 2019-07-21 DIAGNOSIS — M25572 Pain in left ankle and joints of left foot: Secondary | ICD-10-CM

## 2019-07-21 DIAGNOSIS — Z6829 Body mass index (BMI) 29.0-29.9, adult: Secondary | ICD-10-CM

## 2019-07-21 LAB — CBC WITH DIFFERENTIAL/PLATELET
Abs Immature Granulocytes: 0.45 10*3/uL — ABNORMAL HIGH (ref 0.00–0.07)
Basophils Absolute: 0 10*3/uL (ref 0.0–0.1)
Basophils Relative: 0 %
Eosinophils Absolute: 0.1 10*3/uL (ref 0.0–0.5)
Eosinophils Relative: 1 %
HCT: 25.7 % — ABNORMAL LOW (ref 36.0–46.0)
Hemoglobin: 7.8 g/dL — ABNORMAL LOW (ref 12.0–15.0)
Immature Granulocytes: 3 %
Lymphocytes Relative: 7 %
Lymphs Abs: 1.2 10*3/uL (ref 0.7–4.0)
MCH: 22.4 pg — ABNORMAL LOW (ref 26.0–34.0)
MCHC: 30.4 g/dL (ref 30.0–36.0)
MCV: 73.9 fL — ABNORMAL LOW (ref 80.0–100.0)
Monocytes Absolute: 0.9 10*3/uL (ref 0.1–1.0)
Monocytes Relative: 6 %
Neutro Abs: 13.6 10*3/uL — ABNORMAL HIGH (ref 1.7–7.7)
Neutrophils Relative %: 83 %
Platelets: 214 10*3/uL (ref 150–400)
RBC: 3.48 MIL/uL — ABNORMAL LOW (ref 3.87–5.11)
RDW: 17.4 % — ABNORMAL HIGH (ref 11.5–15.5)
WBC: 16.3 10*3/uL — ABNORMAL HIGH (ref 4.0–10.5)
nRBC: 0 % (ref 0.0–0.2)

## 2019-07-21 LAB — URINALYSIS, MICROSCOPIC (REFLEX)
RBC / HPF: >50 RBC/hpf (ref 0–5)
WBC, UA: >50 WBC/hpf (ref 0–5)

## 2019-07-21 LAB — CBG MONITORING, ED
Glucose-Capillary: 58 mg/dL — ABNORMAL LOW (ref 70–99)
Glucose-Capillary: 71 mg/dL (ref 70–99)

## 2019-07-21 LAB — COMPREHENSIVE METABOLIC PANEL
ALT: 15 U/L (ref 0–44)
AST: 13 U/L — ABNORMAL LOW (ref 15–41)
Albumin: 2.9 g/dL — ABNORMAL LOW (ref 3.5–5.0)
Alkaline Phosphatase: 80 U/L (ref 38–126)
Anion gap: 9 (ref 5–15)
BUN: 41 mg/dL — ABNORMAL HIGH (ref 8–23)
CO2: 16 mmol/L — ABNORMAL LOW (ref 22–32)
Calcium: 7.6 mg/dL — ABNORMAL LOW (ref 8.9–10.3)
Chloride: 114 mmol/L — ABNORMAL HIGH (ref 98–111)
Creatinine, Ser: 3.54 mg/dL — ABNORMAL HIGH (ref 0.44–1.00)
GFR calc Af Amer: 15 mL/min — ABNORMAL LOW (ref >60)
GFR calc non Af Amer: 13 mL/min — ABNORMAL LOW (ref >60)
Glucose, Bld: 69 mg/dL — ABNORMAL LOW (ref 70–99)
Potassium: 4.1 mmol/L (ref 3.5–5.1)
Sodium: 139 mmol/L (ref 135–145)
Total Bilirubin: 0.5 mg/dL (ref 0.3–1.2)
Total Protein: 4.9 g/dL — ABNORMAL LOW (ref 6.5–8.1)

## 2019-07-21 LAB — URINALYSIS, ROUTINE W REFLEX MICROSCOPIC
Bilirubin Urine: NEGATIVE
Glucose, UA: NEGATIVE mg/dL
Ketones, ur: NEGATIVE mg/dL
Nitrite: POSITIVE — AB
Protein, ur: 30 mg/dL — AB
Specific Gravity, Urine: 1.015 (ref 1.005–1.030)
pH: 6 (ref 5.0–8.0)

## 2019-07-21 LAB — SARS CORONAVIRUS 2 BY RT PCR (HOSPITAL ORDER, PERFORMED IN ~~LOC~~ HOSPITAL LAB): SARS Coronavirus 2: NEGATIVE

## 2019-07-21 LAB — LIPASE, BLOOD: Lipase: 19 U/L (ref 11–51)

## 2019-07-21 MED ORDER — ONDANSETRON HCL 4 MG/2ML IJ SOLN
4.0000 mg | Freq: Three times a day (TID) | INTRAMUSCULAR | Status: AC | PRN
  Administered 2019-07-22: 4 mg via INTRAVENOUS
  Filled 2019-07-21 (×2): qty 2

## 2019-07-21 MED ORDER — MORPHINE SULFATE (PF) 4 MG/ML IV SOLN
4.0000 mg | Freq: Four times a day (QID) | INTRAVENOUS | Status: DC | PRN
  Administered 2019-07-22 – 2019-07-23 (×4): 4 mg via INTRAVENOUS
  Filled 2019-07-21 (×4): qty 1

## 2019-07-21 MED ORDER — PREGABALIN 25 MG PO CAPS
75.0000 mg | ORAL_CAPSULE | Freq: Two times a day (BID) | ORAL | Status: DC
  Administered 2019-07-22 – 2019-07-25 (×7): 75 mg via ORAL
  Filled 2019-07-21: qty 3
  Filled 2019-07-21: qty 1
  Filled 2019-07-21: qty 3
  Filled 2019-07-21: qty 1
  Filled 2019-07-21 (×4): qty 3

## 2019-07-21 MED ORDER — MORPHINE SULFATE (PF) 4 MG/ML IV SOLN
4.0000 mg | Freq: Once | INTRAVENOUS | Status: AC
  Administered 2019-07-21: 4 mg via INTRAVENOUS
  Filled 2019-07-21: qty 1

## 2019-07-21 MED ORDER — ONDANSETRON HCL 4 MG/2ML IJ SOLN
4.0000 mg | Freq: Once | INTRAMUSCULAR | Status: AC
  Administered 2019-07-21: 4 mg via INTRAVENOUS
  Filled 2019-07-21: qty 2

## 2019-07-21 MED ORDER — DULOXETINE HCL 60 MG PO CPEP
60.0000 mg | ORAL_CAPSULE | Freq: Two times a day (BID) | ORAL | Status: DC
  Administered 2019-07-22 – 2019-07-25 (×7): 60 mg via ORAL
  Filled 2019-07-21 (×8): qty 1

## 2019-07-21 MED ORDER — SODIUM CHLORIDE 0.9 % IV SOLN: Freq: Once | INTRAVENOUS | Status: AC

## 2019-07-21 MED ORDER — AMLODIPINE BESYLATE 10 MG PO TABS
10.0000 mg | ORAL_TABLET | Freq: Every day | ORAL | Status: DC
  Administered 2019-07-23 – 2019-07-25 (×3): 10 mg via ORAL
  Filled 2019-07-21 (×3): qty 1

## 2019-07-21 MED ORDER — MORPHINE SULFATE (PF) 4 MG/ML IV SOLN
4.0000 mg | Freq: Once | INTRAVENOUS | Status: AC
  Administered 2019-07-22: 4 mg via INTRAVENOUS
  Filled 2019-07-21: qty 1

## 2019-07-21 MED ORDER — PANTOPRAZOLE SODIUM 40 MG PO TBEC
40.0000 mg | DELAYED_RELEASE_TABLET | Freq: Two times a day (BID) | ORAL | Status: DC
  Administered 2019-07-22 – 2019-07-25 (×7): 40 mg via ORAL
  Filled 2019-07-21 (×7): qty 1

## 2019-07-21 MED ORDER — SODIUM CHLORIDE 0.9 % IV SOLN
1.0000 g | Freq: Once | INTRAVENOUS | Status: AC
  Administered 2019-07-21: 1 g via INTRAVENOUS
  Filled 2019-07-21: qty 10

## 2019-07-21 MED ORDER — DICYCLOMINE HCL 20 MG PO TABS
20.0000 mg | ORAL_TABLET | Freq: Two times a day (BID) | ORAL | Status: DC | PRN
  Filled 2019-07-21: qty 1

## 2019-07-21 MED ORDER — MYCOPHENOLATE MOFETIL 250 MG PO CAPS
500.0000 mg | ORAL_CAPSULE | Freq: Every morning | ORAL | Status: DC
  Administered 2019-07-23 – 2019-07-25 (×3): 500 mg via ORAL
  Filled 2019-07-21 (×4): qty 2

## 2019-07-21 MED ORDER — HYDROXYCHLOROQUINE SULFATE 200 MG PO TABS
200.0000 mg | ORAL_TABLET | Freq: Every day | ORAL | Status: DC
  Administered 2019-07-23 – 2019-07-25 (×3): 200 mg via ORAL
  Filled 2019-07-21 (×4): qty 1

## 2019-07-21 MED ORDER — HYDROCODONE-ACETAMINOPHEN 5-325 MG PO TABS: 1.0000 | ORAL_TABLET | Freq: Three times a day (TID) | ORAL | Status: DC | PRN

## 2019-07-21 MED ORDER — LEVOTHYROXINE SODIUM 25 MCG PO TABS: 25.0000 ug | ORAL_TABLET | Freq: Every day | ORAL | Status: DC

## 2019-07-21 MED ORDER — MORPHINE SULFATE (PF) 2 MG/ML IV SOLN
2.0000 mg | Freq: Once | INTRAVENOUS | Status: AC
  Administered 2019-07-21: 2 mg via INTRAVENOUS
  Filled 2019-07-21: qty 1

## 2019-07-21 MED ORDER — DEXTROSE-NACL 5-0.45 % IV SOLN: INTRAVENOUS | Status: DC

## 2019-07-21 NOTE — ED Notes (Signed)
RT performed arterial stick for blood draws after multiple failed attempts by nursing staff.  Blood given to RN to send to lab. Pt tolerated well.  No issues noted.

## 2019-07-21 NOTE — ED Notes (Signed)
Attempted lab draw from Pt. X 2 unsuccessful.    Pt. In no acute distress and not actively vomiting at present time.  Pt. Is with radiology at this time.

## 2019-07-21 NOTE — ED Triage Notes (Signed)
Pt reports 3 days of generalized abd pain with vomiting and inability to retain any po. Denies fevers or diarrhea.

## 2019-07-21 NOTE — ED Notes (Signed)
RN in room drawing labs and giving pain meds; pt requested time for pain meds to take effect prior to imaging

## 2019-07-21 NOTE — ED Provider Notes (Signed)
[facility] EMERGENCY DEPARTMENT Provider Note   CSN: [id number] Arrival date & time: 07/21/19  1514     History No chief complaint on file.   Carolyn Zamora is a 64 y.o. female.  HPI  Patient is a 64 year old female with past medical history significant for single kidney after surgical removal due to renal cell carcinoma, lupus on immunosuppression, hypertension, CKD 4, anemia.  Patient presents today for 3 days of nausea, vomiting and generalized abdominal pain.  She states it is achy, severe, no specific aggravating or mitigating factors.  She states that she has not been able to tolerate p.o. at all over the past 3 days.  She states she has vomited 4-5 times over the past 24 hours.  Patient states that she has had severe difficulty managing her pain.  She denies any dysuria, frequency or urgency but states that she has a history of bad UTIs in the past with subtle symptoms.    Past Medical History:  Diagnosis Date  . Abdominal wall hematoma 08/01/2017  . Anemia   . AVN (avascular necrosis of bone) ([place]) 08/01/2017  . CKD (chronic kidney disease), stage IV ([place])   . History of dental abscess with sepsis 2017 08/02/2017  . History of kidney cancer   . Hypertension   . Lupus ([place])   . 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 Ventral hernia without obstruction or gangrene 10/24/2015    Patient Active Problem List   Diagnosis Date Noted  . SIRS (systemic inflammatory response syndrome) ([place]) 07/21/2019  . Pain in both feet 01/11/2019  . Hypomagnesemia 01/11/2019  . Sepsis due to urinary tract infection ([place]) 01/10/2019  . AKI (acute kidney injury) ([place]) 12/15/2017  . Diarrhea 12/15/2017  . Dehydration 12/15/2017  . Anemia of chronic disease 12/15/2017  . Renal failure (ARF), acute on chronic (HCC) 12/15/2017  . History of dental abscess with sepsis 2017 08/02/2017  . History of methicillin resistant staphylococcus aureus (MRSA)  08/02/2017  . AVN (avascular necrosis of bone) ([place]) 08/01/2017  . Intractable vomiting 07/31/2017  . Sepsis ([place]) 07/23/2017  . Abdominal wall seroma - chronic from prior [facility] repair 2017 03/03/2016  . Immunocompromised state ([place]) 02/22/2016  . Low back pain 02/20/2016  . Pressure injury of skin 12/28/2015  . Acute renal failure (ARF) ([place]) 12/27/2015  . UTI (urinary tract infection) 12/27/2015  . Hyperkalemia 12/27/2015  . Hypertension   . Acute renal failure superimposed on stage 3 chronic kidney disease ([place]) 08/03/2015  . Systemic lupus erythematosus ([place]) 03/27/2015  . Acquired absence of spleen 06/21/2014  . 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 Chronic pain associated with significant psychosocial dysfunction 09/01/2013  . Chronic pain syndrome 09/01/2013  . Chronic kidney disease, stage III (moderate) 08/17/2013    Past Surgical History:  Procedure Laterality Date  . INCISIONAL HERNIA REPAIR  11/19/2015   [facility].  Dr Phineas Douglas.  left VWH repair w mesh  . MULTIPLE TOOTH EXTRACTIONS    . NEPHRECTOMY    . ORIF ANKLE FRACTURE  11/16/2015   Max Cohen, 2017  . SPLENECTOMY       OB History   No obstetric history on file.     Family History  Problem Relation Age of Onset  . Hypertension Mother   . Diabetes Mellitus I Mother   . Lung cancer Father   . Hypertension Sister   . Colon cancer Brother     Social History  Tobacco Use  . Smoking status: Never Smoker  . Smokeless tobacco: Never Used  Vaping Use  . Vaping Use: Never used  Substance Use Topics  . Alcohol use: No  . Drug use: No    Home Medications Prior to Admission medications   Medication Sig Start Date End Date Taking? Authorizing Provider  amLODipine (NORVASC) 10 MG tablet Take 10 mg by mouth daily. 07/23/17   [provider]  cyanocobalamin (,VITAMIN B-12,) 1000 MCG/ML injection Inject 1 mL into the muscle every 30 (thirty) days. Inject around the 15th. 09/17/18    [provider]  dicyclomine (BENTYL) 20 MG tablet Take 1 tablet (20 mg total) by mouth 2 (two) times daily as needed for spasms. 01/25/18   Caccavale, Sophia, PA-C  DULoxetine (CYMBALTA) 60 MG capsule Take 60 mg by mouth 2 (two) times daily. 11/23/18   [provider]  furosemide (LASIX) 40 MG tablet Take 40 mg by mouth daily. 07/02/17   [provider]  HYDROcodone-acetaminophen (NORCO/VICODIN) 5-325 MG tablet Take 1 tablet by mouth every 8 (eight) hours as needed for pain. 12/15/15   [provider]  hydroxychloroquine (PLAQUENIL) 200 MG tablet Take 200 mg by mouth daily. 12/15/15   [provider]  hydrOXYzine (ATARAX/VISTARIL) 50 MG tablet Take 50 mg by mouth every morning.  07/06/17   [provider]  levothyroxine (SYNTHROID) 25 MCG tablet Take 25 mcg by mouth daily. 10/19/18   [provider]  mycophenolate (CELLCEPT) 500 MG tablet Take 500 mg by mouth every morning.  03/18/17   [provider]  pantoprazole (PROTONIX) 40 MG tablet Take 40 mg by mouth 2 (two) times daily. 11/23/18   [provider]  predniSONE (DELTASONE) 5 MG tablet Take 5 mg by mouth daily. 10/26/18   [provider]  pregabalin (LYRICA) 75 MG capsule Take 75 mg by mouth 2 (two) times daily.  10/19/18   [provider]  tiZANidine (ZANAFLEX) 4 MG tablet Take 2 mg by mouth 2 (two) times daily as needed for muscle spasms. 1 to 2 times a day  01/04/19   [provider]  traZODone (DESYREL) 50 MG tablet Take 50 mg by mouth daily. 06/29/17   [provider]  Vitamin D, Ergocalciferol, (DRISDOL) 50000 units CAPS capsule Take 50,000 Units by mouth every Monday.  07/01/17   [provider]    Allergies    Patient has no known allergies.  Review of Systems   Review of Systems  Constitutional: Positive for fatigue. Negative for chills and fever.  HENT: Negative for congestion.   Eyes: Negative for pain.    Respiratory: Negative for cough and shortness of breath.   Cardiovascular: Negative for chest pain and leg swelling.  Gastrointestinal: Positive for abdominal pain, nausea and vomiting. Negative for blood in stool, constipation and diarrhea.  Genitourinary: Negative for dysuria, flank pain, hematuria, vaginal bleeding, vaginal discharge and vaginal pain.  Musculoskeletal: Negative for myalgias.  Skin: Negative for rash.  Neurological: Negative for dizziness and headaches.    Physical Exam Updated Vital Signs BP (!) 146/89   Pulse (!) 105   Temp 98.4 F (36.9 C)   Resp 17   Ht 5\' 2"  (1.575 m)   Wt 73.5 kg   SpO2 98%   BMI 29.63 kg/m   Physical Exam Vitals and nursing note reviewed.  Constitutional:      General: She is not in acute distress. HENT:     Head: Normocephalic and atraumatic.  Nose: Nose normal.     Mouth/Throat:     Mouth: Mucous membranes are dry.  Eyes:     General: No scleral icterus. Cardiovascular:     Rate and Rhythm: Normal rate and regular rhythm.     Pulses: Normal pulses.     Heart sounds: Normal heart sounds.  Pulmonary:     Effort: Pulmonary effort is normal. No respiratory distress.     Breath sounds: No wheezing.  Abdominal:     Palpations: Abdomen is soft.     Tenderness: There is abdominal tenderness. There is no right CVA tenderness, left CVA tenderness, guarding or rebound.     Comments: Diffuse tenderness to palpation of the abdomen.  No focal tenderness.  Abdomen is obese but soft.  No guarding or rebound.  Musculoskeletal:     Cervical back: Normal range of motion.     Right lower leg: No edema.     Left lower leg: No edema.  Skin:    General: Skin is warm and dry.     Capillary Refill: Capillary refill takes less than 2 seconds.  Neurological:     Mental Status: She is alert. Mental status is at baseline.  Psychiatric:        Mood and Affect: Mood normal.        Behavior: Behavior normal.     ED Results / Procedures /  Treatments   Labs (all labs ordered are listed, but only abnormal results are displayed) Labs Reviewed  CBC WITH DIFFERENTIAL/PLATELET - Abnormal; Notable for the following components:      Result Value   WBC 16.3 (*)    RBC 3.48 (*)    Hemoglobin 7.8 (*)    HCT 25.7 (*)    MCV 73.9 (*)    MCH 22.4 (*)    RDW 17.4 (*)    Neutro Abs 13.6 (*)    Abs Immature Granulocytes 0.45 (*)    All other components within normal limits  COMPREHENSIVE METABOLIC PANEL - Abnormal; Notable for the following components:   Chloride 114 (*)    CO2 16 (*)    Glucose, Bld 69 (*)    BUN 41 (*)    Creatinine, Ser 3.54 (*)    Calcium 7.6 (*)    Total Protein 4.9 (*)    Albumin 2.9 (*)    AST 13 (*)    GFR calc non Af Amer 13 (*)    GFR calc Af Amer 15 (*)    All other components within normal limits  URINALYSIS, ROUTINE W REFLEX MICROSCOPIC - Abnormal; Notable for the following components:   APPearance CLOUDY (*)    Hgb urine dipstick LARGE (*)    Protein, ur 30 (*)    Nitrite POSITIVE (*)    Leukocytes,Ua MODERATE (*)    All other components within normal limits  URINALYSIS, MICROSCOPIC (REFLEX) - Abnormal; Notable for the following components:   Bacteria, UA MANY (*)    All other components within normal limits  CBG MONITORING, ED - Abnormal; Notable for the following components:   Glucose-Capillary 58 (*)    All other components within normal limits  SARS CORONAVIRUS 2 BY RT PCR [facility] ORDER, [place] [facility])  URINE CULTURE  LIPASE, BLOOD    EKG EKG Interpretation  Date/Time:  Thursday July 21 2019 18:09:34 EDT Ventricular Rate:  99 PR Interval:    QRS Duration: 82 QT Interval:  348 QTC Calculation: 447 R Axis:   27  Text Interpretation: Sinus rhythm Low voltage, precordial leads Nonspecific T abnormalities, lateral leads No STEMI Confirmed by Nanda Quinton [phone]) on 07/21/2019 6:11:08 PM   Radiology CT ABDOMEN PELVIS WO CONTRAST  Result Date:  07/21/2019 CLINICAL DATA:  Generalized abdominal pain for 3 days, vomiting EXAM: CT ABDOMEN AND PELVIS WITHOUT CONTRAST TECHNIQUE: Multidetector CT imaging of the abdomen and pelvis was performed following the standard protocol without IV contrast. COMPARISON:  01/15/2019 FINDINGS: Lower chest: Chronic consolidation at the left lung base favor atelectasis. Trace left pleural effusion versus pleural thickening unchanged. Right lung base is clear. Hepatobiliary: Gallbladder is distended without calcified gallstones or gallbladder wall thickening. No focal liver abnormalities. Pancreas: Unremarkable. No pancreatic ductal dilatation or surrounding inflammatory changes. Spleen: Multiple splenules are again seen within the left upper quadrant. No change. Adrenals/Urinary Tract: Postsurgical changes from left nephrectomy. No abnormalities within the nephrectomy bed. The right kidney displaced compensatory hypertrophy. No right-sided calculi or obstruction. Bladder is decompressed which limits the evaluation. The right adrenal is unremarkable. Stomach/Bowel: No bowel obstruction or ileus. Moderate retained stool primarily seen within the transverse colon. The appendix is not identified. No inflammatory changes to suggest appendicitis. Vascular/Lymphatic: Aortic atherosclerosis. No enlarged abdominal or pelvic lymph nodes. Reproductive: Stable calcified exophytic uterine fibroid. No adnexal masses. Other: No abdominal wall hernia or abnormality. No abdominopelvic ascites. Musculoskeletal: Stable bilateral femoral head avascular necrosis. No acute displaced fractures. There is grade 2 anterolisthesis of L4 on L5. No pars defects. Reconstructed images demonstrate no additional findings. IMPRESSION: 1. Nonspecific gallbladder distension, without evidence of cholelithiasis or wall thickening. If gallbladder pathology is suspected, right upper quadrant ultrasound could be considered. 2. Stable postsurgical changes from left  nephrectomy and splenectomy, with residual splenules unchanged. 3. Stable bilateral femoral head avascular necrosis. 4. Moderate retained stool. 5. Aortic Atherosclerosis (ICD10-I70.0). Electronically Signed   By: Randa Ngo M.D.   On: 07/21/2019 17:10   US Abdomen Limited RUQ  Result Date: 07/21/2019 CLINICAL DATA:  Nausea vomiting, generalized abdominal pain, abnormal CT EXAM: ULTRASOUND ABDOMEN LIMITED RIGHT UPPER QUADRANT COMPARISON:  07/21/2019 FINDINGS: Gallbladder: Gallbladder is markedly distended measuring 12.1 cm in long axis. No evidence of cholelithiasis or biliary sludge. Gallbladder wall is not thickened measuring 3 mm. There is trace pericholecystic fluid, nonspecific. Negative sonographic Murphy sign. Common bile duct: Diameter: 10 mm Liver: Liver demonstrates normal echotexture. Mild periportal edema and intrahepatic biliary duct dilation. Portal vein is patent on color Doppler imaging with normal direction of blood flow towards the liver. Other: None. IMPRESSION: 1. Dilated common bile duct, with mild periportal edema and intrahepatic biliary duct dilation. Along with distended gallbladder, this raises the possibility of downstream common bile duct pathology. ERCP or MRCP could be considered for follow-up. Please correlate with liver function tests. Electronically Signed   By: Randa Ngo M.D.   On: 07/21/2019 19:27    Procedures Procedures (including critical care time)  Medications Ordered in ED Medications  dextrose 5 %-0.45 % sodium chloride infusion ( Intravenous New Bag/Given 07/21/19 2122)  0.9 %  sodium chloride infusion ( Intravenous Stopped 07/21/19 1815)  morphine 2 MG/ML injection 2 mg (2 mg Intravenous Given 07/21/19 1615)  ondansetron (ZOFRAN) injection 4 mg (4 mg Intravenous Given 07/21/19 1614)  morphine 4 MG/ML injection 4 mg (4 mg Intravenous Given 07/21/19 1751)  ondansetron (ZOFRAN) injection 4 mg (4 mg Intravenous Given 07/21/19 1813)  cefTRIAXone (ROCEPHIN)  1 g in sodium chloride 0.9 % 100 mL IVPB (0 g Intravenous Stopped 07/21/19 2037)  ED Course  I have reviewed the triage vital signs and the nursing notes.  Pertinent labs & imaging results that were available during my care of the patient were reviewed by me and considered in my medical decision making (see chart for details).  Patient is 64 year old female with past medical history detailed below presented today with nausea, vomiting, intractable abdominal pain.  I reviewed patient's records.  The patient has a history of severe UTI which resolved during hospital stay presents with nausea.  Concerned that this may be a presentation of a similar issue today.  She is significantly tachycardic initially but this improved with fluids and pain control.  Clinical Course as of Jul 21 2146  Thu Jul 21, 2019  3875 CMP notable for creatinine 3.54 which is not significantly deviated from her baseline.  BUN is elevated at 41 which is consistent with prerenal dehydration.  She does not have a significant AKI however.  Patient's blood sugar is moderately low at 69. Will provide patient with food once ultrasound / CT imaging is complete   [WF]  2020 Nonspecific leukocytosis 16.3 hemoglobin 7.8 this is consistent with her chronic stable anemia.   [WF]  2021 Urinalysis with many bacteria, cloudy appearance large hemoglobin and nitrates and leukocytes.  This is consistent with UTI and is likely reason for her tachycardia, nausea, vomiting and abdominal pain.   [WF]  2022 CT scan with nonspecific biliary distention.  Ultrasound obtained to evaluate this further.  No evidence of biliary stones.  There is some mild common bile duct dilatation but LFTs are within normal limits.   [WF]  2022 EKG obtained for QT interval evaluation given her multiple episodes of antiemetic medication.   [WF]  2023 Covid swab and urine culture pending   [WF]    Clinical Course User Index [WF] Tedd Sias, PA   I  discussed this case with my attending physician who cosigned this note including patient's presenting symptoms, physical exam, and planned diagnostics and interventions. Attending physician stated agreement with plan or made changes to plan which were implemented.   We will admit patient for dehydration, urinary tract infection, severe abdominal pain.  Given her questionable common bile duct dilatation she may benefit from follow-up with gastroenterology during her hospital stay.  Patient started on half-normal saline with D5.  Check CBG regularly.  She is n.p.o. at this time given her nausea and vomiting.   MDM Rules/Calculators/A&P                          9:48 PM discussed with Dr. Hal Hope who will admit patient to hospital.   Final Clinical Impression(s) / ED Diagnoses Final diagnoses:  RUQ abdominal pain  Generalized abdominal pain  Non-intractable vomiting with nausea, unspecified vomiting type  Acute cystitis with hematuria    Rx / DC Orders ED Discharge Orders    None       Tedd Sias, [place] 07/21/19 2149    Margette Fast, MD 07/26/19 1946

## 2019-07-22 ENCOUNTER — Encounter (HOSPITAL_COMMUNITY): Payer: Self-pay | Admitting: Internal Medicine

## 2019-07-22 ENCOUNTER — Inpatient Hospital Stay (HOSPITAL_COMMUNITY): Payer: Medicare Other

## 2019-07-22 DIAGNOSIS — N179 Acute kidney failure, unspecified: Secondary | ICD-10-CM | POA: Diagnosis present

## 2019-07-22 DIAGNOSIS — R1310 Dysphagia, unspecified: Secondary | ICD-10-CM | POA: Diagnosis present

## 2019-07-22 DIAGNOSIS — G894 Chronic pain syndrome: Secondary | ICD-10-CM | POA: Diagnosis present

## 2019-07-22 DIAGNOSIS — M879 Osteonecrosis, unspecified: Secondary | ICD-10-CM | POA: Diagnosis present

## 2019-07-22 DIAGNOSIS — E162 Hypoglycemia, unspecified: Secondary | ICD-10-CM | POA: Diagnosis present

## 2019-07-22 DIAGNOSIS — K838 Other specified diseases of biliary tract: Secondary | ICD-10-CM | POA: Diagnosis present

## 2019-07-22 DIAGNOSIS — E669 Obesity, unspecified: Secondary | ICD-10-CM | POA: Diagnosis present

## 2019-07-22 DIAGNOSIS — E860 Dehydration: Secondary | ICD-10-CM | POA: Diagnosis present

## 2019-07-22 DIAGNOSIS — Z20822 Contact with and (suspected) exposure to covid-19: Secondary | ICD-10-CM | POA: Diagnosis present

## 2019-07-22 DIAGNOSIS — E039 Hypothyroidism, unspecified: Secondary | ICD-10-CM | POA: Diagnosis present

## 2019-07-22 DIAGNOSIS — R1011 Right upper quadrant pain: Secondary | ICD-10-CM | POA: Diagnosis present

## 2019-07-22 DIAGNOSIS — E872 Acidosis: Secondary | ICD-10-CM | POA: Diagnosis present

## 2019-07-22 DIAGNOSIS — D509 Iron deficiency anemia, unspecified: Secondary | ICD-10-CM | POA: Diagnosis present

## 2019-07-22 DIAGNOSIS — Z1629 Resistance to other single specified antibiotic: Secondary | ICD-10-CM | POA: Diagnosis present

## 2019-07-22 DIAGNOSIS — N10 Acute tubulo-interstitial nephritis: Secondary | ICD-10-CM | POA: Diagnosis present

## 2019-07-22 DIAGNOSIS — N184 Chronic kidney disease, stage 4 (severe): Secondary | ICD-10-CM | POA: Diagnosis present

## 2019-07-22 DIAGNOSIS — N3000 Acute cystitis without hematuria: Secondary | ICD-10-CM | POA: Diagnosis not present

## 2019-07-22 DIAGNOSIS — R109 Unspecified abdominal pain: Secondary | ICD-10-CM | POA: Diagnosis present

## 2019-07-22 DIAGNOSIS — I120 Hypertensive chronic kidney disease with stage 5 chronic kidney disease or end stage renal disease: Secondary | ICD-10-CM | POA: Diagnosis present

## 2019-07-22 DIAGNOSIS — K219 Gastro-esophageal reflux disease without esophagitis: Secondary | ICD-10-CM | POA: Diagnosis present

## 2019-07-22 DIAGNOSIS — F329 Major depressive disorder, single episode, unspecified: Secondary | ICD-10-CM | POA: Diagnosis present

## 2019-07-22 DIAGNOSIS — D631 Anemia in chronic kidney disease: Secondary | ICD-10-CM | POA: Diagnosis present

## 2019-07-22 DIAGNOSIS — M329 Systemic lupus erythematosus, unspecified: Secondary | ICD-10-CM | POA: Diagnosis present

## 2019-07-22 DIAGNOSIS — Z6829 Body mass index (BMI) 29.0-29.9, adult: Secondary | ICD-10-CM | POA: Diagnosis not present

## 2019-07-22 DIAGNOSIS — B9620 Unspecified Escherichia coli [E. coli] as the cause of diseases classified elsewhere: Secondary | ICD-10-CM | POA: Diagnosis present

## 2019-07-22 DIAGNOSIS — N185 Chronic kidney disease, stage 5: Secondary | ICD-10-CM | POA: Diagnosis present

## 2019-07-22 DIAGNOSIS — F419 Anxiety disorder, unspecified: Secondary | ICD-10-CM | POA: Diagnosis present

## 2019-07-22 LAB — LACTIC ACID, PLASMA
Lactic Acid, Venous: 1.1 mmol/L (ref 0.5–1.9)
Lactic Acid, Venous: 0.8 mmol/L (ref 0.5–1.9)

## 2019-07-22 LAB — CBC WITH DIFFERENTIAL/PLATELET
Abs Immature Granulocytes: 0.21 10*3/uL — ABNORMAL HIGH (ref 0.00–0.07)
Basophils Absolute: 0.1 10*3/uL (ref 0.0–0.1)
Basophils Relative: 0 %
Eosinophils Absolute: 0.1 10*3/uL (ref 0.0–0.5)
Eosinophils Relative: 1 %
HCT: 29.7 % — ABNORMAL LOW (ref 36.0–46.0)
Hemoglobin: 8.9 g/dL — ABNORMAL LOW (ref 12.0–15.0)
Immature Granulocytes: 2 %
Lymphocytes Relative: 11 %
Lymphs Abs: 1.6 10*3/uL (ref 0.7–4.0)
MCH: 22.7 pg — ABNORMAL LOW (ref 26.0–34.0)
MCHC: 30 g/dL (ref 30.0–36.0)
MCV: 75.8 fL — ABNORMAL LOW (ref 80.0–100.0)
Monocytes Absolute: 1.1 10*3/uL — ABNORMAL HIGH (ref 0.1–1.0)
Monocytes Relative: 8 %
Neutro Abs: 11.2 10*3/uL — ABNORMAL HIGH (ref 1.7–7.7)
Neutrophils Relative %: 78 %
Platelets: 205 10*3/uL (ref 150–400)
RBC: 3.92 MIL/uL (ref 3.87–5.11)
RDW: 17.9 % — ABNORMAL HIGH (ref 11.5–15.5)
WBC: 14.4 10*3/uL — ABNORMAL HIGH (ref 4.0–10.5)
nRBC: 0 % (ref 0.0–0.2)

## 2019-07-22 LAB — CBG MONITORING, ED
Glucose-Capillary: 78 mg/dL (ref 70–99)
Glucose-Capillary: 80 mg/dL (ref 70–99)
Glucose-Capillary: 83 mg/dL (ref 70–99)
Glucose-Capillary: 86 mg/dL (ref 70–99)
Glucose-Capillary: 86 mg/dL (ref 70–99)
Glucose-Capillary: 88 mg/dL (ref 70–99)

## 2019-07-22 LAB — COMPREHENSIVE METABOLIC PANEL
ALT: 15 U/L (ref 0–44)
AST: 15 U/L (ref 15–41)
Albumin: 2.8 g/dL — ABNORMAL LOW (ref 3.5–5.0)
Alkaline Phosphatase: 83 U/L (ref 38–126)
Anion gap: 9 (ref 5–15)
BUN: 36 mg/dL — ABNORMAL HIGH (ref 8–23)
CO2: 16 mmol/L — ABNORMAL LOW (ref 22–32)
Calcium: 7.8 mg/dL — ABNORMAL LOW (ref 8.9–10.3)
Chloride: 114 mmol/L — ABNORMAL HIGH (ref 98–111)
Creatinine, Ser: 3.86 mg/dL — ABNORMAL HIGH (ref 0.44–1.00)
GFR calc Af Amer: 14 mL/min — ABNORMAL LOW (ref >60)
GFR calc non Af Amer: 12 mL/min — ABNORMAL LOW (ref >60)
Glucose, Bld: 83 mg/dL (ref 70–99)
Potassium: 4.5 mmol/L (ref 3.5–5.1)
Sodium: 139 mmol/L (ref 135–145)
Total Bilirubin: 0.5 mg/dL (ref 0.3–1.2)
Total Protein: 5.2 g/dL — ABNORMAL LOW (ref 6.5–8.1)

## 2019-07-22 LAB — TSH: TSH: 7.892 u[IU]/mL — ABNORMAL HIGH (ref 0.350–4.500)

## 2019-07-22 LAB — GLUCOSE, CAPILLARY
Glucose-Capillary: 76 mg/dL (ref 70–99)
Glucose-Capillary: 85 mg/dL (ref 70–99)
Glucose-Capillary: 91 mg/dL (ref 70–99)
Glucose-Capillary: 90 mg/dL (ref 70–99)

## 2019-07-22 LAB — MAGNESIUM: Magnesium: 1.1 mg/dL — ABNORMAL LOW (ref 1.7–2.4)

## 2019-07-22 LAB — MRSA PCR SCREENING: MRSA by PCR: NEGATIVE

## 2019-07-22 MED ORDER — ONDANSETRON HCL 4 MG/2ML IJ SOLN: 4.0000 mg | Freq: Four times a day (QID) | INTRAMUSCULAR | Status: DC | PRN

## 2019-07-22 MED ORDER — DEXTROSE-NACL 5-0.45 % IV SOLN: INTRAVENOUS | Status: DC

## 2019-07-22 MED ORDER — FUROSEMIDE 40 MG PO TABS
40.0000 mg | ORAL_TABLET | Freq: Every day | ORAL | Status: DC
  Administered 2019-07-23 – 2019-07-25 (×3): 40 mg via ORAL
  Filled 2019-07-22 (×3): qty 1

## 2019-07-22 MED ORDER — FENTANYL CITRATE (PF) 100 MCG/2ML IJ SOLN
50.0000 ug | Freq: Once | INTRAMUSCULAR | Status: AC
  Administered 2019-07-22: 50 ug via INTRAVENOUS
  Filled 2019-07-22: qty 2

## 2019-07-22 MED ORDER — HEPARIN SODIUM (PORCINE) 5000 UNIT/ML IJ SOLN
5000.0000 [IU] | Freq: Three times a day (TID) | INTRAMUSCULAR | Status: DC
  Administered 2019-07-22 – 2019-07-25 (×9): 5000 [IU] via SUBCUTANEOUS
  Filled 2019-07-22 (×9): qty 1

## 2019-07-22 MED ORDER — ACETAMINOPHEN 325 MG PO TABS: 650.0000 mg | ORAL_TABLET | Freq: Four times a day (QID) | ORAL | Status: DC | PRN

## 2019-07-22 MED ORDER — SODIUM CHLORIDE 0.9 % IV SOLN
1.0000 g | Freq: Once | INTRAVENOUS | Status: AC
  Administered 2019-07-22: 1 g via INTRAVENOUS
  Filled 2019-07-22: qty 1

## 2019-07-22 MED ORDER — ONDANSETRON HCL 4 MG PO TABS: 4.0000 mg | ORAL_TABLET | Freq: Four times a day (QID) | ORAL | Status: DC | PRN

## 2019-07-22 MED ORDER — ACETAMINOPHEN 650 MG RE SUPP: 650.0000 mg | Freq: Four times a day (QID) | RECTAL | Status: DC | PRN

## 2019-07-22 MED ORDER — LEVOTHYROXINE SODIUM 25 MCG PO TABS
25.0000 ug | ORAL_TABLET | Freq: Every day | ORAL | Status: DC
  Administered 2019-07-23: 25 ug via ORAL
  Filled 2019-07-22: qty 1

## 2019-07-22 MED ORDER — OXYCODONE HCL 5 MG PO TABS
5.0000 mg | ORAL_TABLET | ORAL | Status: DC | PRN
  Administered 2019-07-22 – 2019-07-25 (×2): 5 mg via ORAL
  Filled 2019-07-22 (×2): qty 1

## 2019-07-22 MED ORDER — HYDROMORPHONE HCL 1 MG/ML IJ SOLN
0.5000 mg | INTRAMUSCULAR | Status: DC | PRN
  Administered 2019-07-22 – 2019-07-23 (×3): 1 mg via INTRAVENOUS
  Administered 2019-07-23: 0.5 mg via INTRAVENOUS
  Administered 2019-07-24 – 2019-07-25 (×4): 1 mg via INTRAVENOUS
  Filled 2019-07-22 (×9): qty 1

## 2019-07-22 MED ORDER — PREDNISONE 5 MG PO TABS
5.0000 mg | ORAL_TABLET | Freq: Every day | ORAL | Status: DC
  Administered 2019-07-23 – 2019-07-25 (×3): 5 mg via ORAL
  Filled 2019-07-22 (×3): qty 1

## 2019-07-22 NOTE — ED Notes (Signed)
Pt medicated for pain prior to transport per Dr Regenia Skeeter.  Rating pain 8/10

## 2019-07-22 NOTE — Progress Notes (Signed)
Pt left for MRI. Report given before procedure.

## 2019-07-22 NOTE — H&P (Signed)
History and Physical    Carolyn Zamora JHE:[id number] DOB: [birth year] DOA: 07/21/2019  PCP: Beckie Salts, MD  Patient coming from: [place]  I have personally briefly reviewed patient's old medical records in [place]  Chief Complaint: Abdominal pain, dehydration  HPI: Carolyn Zamora is a 64 y.o. female with medical history significant of left renal cell carcinoma status post left nephrectomy, history of splenectomy, CKD stage IV, chronic pain syndrome, morbid obesity, lupus on chronic immunosuppressive therapy, hypertension, anemia of chronic disease, avascular necrosis presented to [place] with generalized abdominal pain, nausea, vomiting since 2 days.  Patient tells me that his abdominal pain is severe, generalized, 8 out of 10, nonradiating, no aggravating or relieving factors, associated with nausea, vomiting, chills.  She tells me that she vomited 4-5 times in 1 to 2 days.  Nonbloody, denies association with decreased appetite, weight loss, melena.  She denies dysuria, hematuria, foul-smelling urine, change in urinary frequency, however has history of UTI in the past.  No history of jaundice, pruritus.  Her daughter lives with her.  No history of smoking, alcohol, illicit drug use.  She uses cane/walker for ambulation.  ED Course: At [facility]: Patient tachycardic, blood pressure labile, highest 155/84, lowest: 90/68, leukocytosis of 16.3, lipase: WNL, UA positive for nitrites and leukocytes, COVID-19 negative.  CBC shows H&H of 7.8/25.7, MCV: 73.9, CMP shows CKD stage 4.  COVID-19 negative, right upper quadrant ultrasound shows dilated common bile duct with mild periportal edema and intrahepatic biliary duct dilation along with distended gallbladder.  Patient was given D5 half-normal saline (blood sugar: In the 80s), Zofran, morphine, Rocephin in the ED and transfer patient to [facility] due to dehydration, abdominal pain,  UTI.  Review of Systems: As per HPI otherwise negative.    Past Medical History:  Diagnosis Date  . Abdominal wall hematoma 08/01/2017  . Anemia   . AVN (avascular necrosis of bone) ([place]) 08/01/2017  . CKD (chronic kidney disease), stage IV ([place])   . History of dental abscess with sepsis 2017 08/02/2017  . History of kidney cancer   . Hypertension   . Lupus ([place])   . 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 Ventral hernia without obstruction or gangrene 10/24/2015    Past Surgical History:  Procedure Laterality Date  . INCISIONAL HERNIA REPAIR  11/19/2015   [facility].  Dr Phineas Douglas.  left VWH repair w mesh  . MULTIPLE TOOTH EXTRACTIONS    . NEPHRECTOMY    . ORIF ANKLE FRACTURE  11/16/2015   [facility], 2017  . SPLENECTOMY       reports that she has never smoked. She has never used smokeless tobacco. She reports that she does not drink alcohol and does not use drugs.  No Known Allergies  Family History  Problem Relation Age of Onset  . Hypertension Mother   . Diabetes Mellitus I Mother   . Lung cancer Father   . Hypertension Sister   . Colon cancer Brother     Prior to Admission medications   Medication Sig Start Date End Date Taking? Authorizing Provider  amLODipine (NORVASC) 10 MG tablet Take 10 mg by mouth daily. 07/23/17   [provider]  cyanocobalamin (,VITAMIN B-12,) 1000 MCG/ML injection Inject 1 mL into the muscle every 30 (thirty) days. Inject around the 15th. 09/17/18   [provider]  dicyclomine (BENTYL) 20 MG tablet Take 1 tablet (20 mg total) by mouth  2 (two) times daily as needed for spasms. 01/25/18   Caccavale, Sophia, PA-C  DULoxetine (CYMBALTA) 60 MG capsule Take 60 mg by mouth 2 (two) times daily. 11/23/18   [provider]  furosemide (LASIX) 40 MG tablet Take 40 mg by mouth daily. 07/02/17   [provider]  HYDROcodone-acetaminophen (NORCO/VICODIN) 5-325 MG tablet Take 1 tablet by mouth every 8  (eight) hours as needed for pain. 12/15/15   [provider]  hydroxychloroquine (PLAQUENIL) 200 MG tablet Take 200 mg by mouth daily. 12/15/15   [provider]  hydrOXYzine (ATARAX/VISTARIL) 50 MG tablet Take 50 mg by mouth every morning.  07/06/17   [provider]  levothyroxine (SYNTHROID) 25 MCG tablet Take 25 mcg by mouth daily. 10/19/18   [provider]  mycophenolate (CELLCEPT) 500 MG tablet Take 500 mg by mouth every morning.  03/18/17   [provider]  pantoprazole (PROTONIX) 40 MG tablet Take 40 mg by mouth 2 (two) times daily. 11/23/18   [provider]  predniSONE (DELTASONE) 5 MG tablet Take 5 mg by mouth daily. 10/26/18   [provider]  pregabalin (LYRICA) 75 MG capsule Take 75 mg by mouth 2 (two) times daily.  10/19/18   [provider]  tiZANidine (ZANAFLEX) 4 MG tablet Take 2 mg by mouth 2 (two) times daily as needed for muscle spasms. 1 to 2 times a day  01/04/19   [provider]  traZODone (DESYREL) 50 MG tablet Take 50 mg by mouth daily. 06/29/17   [provider]  Vitamin D, Ergocalciferol, (DRISDOL) 50000 units CAPS capsule Take 50,000 Units by mouth every Monday.  07/01/17   [provider]    Physical Exam: Vitals:   07/22/19 1100 07/22/19 1138 07/22/19 1217 07/22/19 1312  BP: 90/68 118/87 (!) 155/84   Pulse: (!) 103 (!) 104 96   Resp: 13 15  16   Temp:  98.7 F (37.1 C) 98 F (36.7 C) 97.9 F (36.6 C)  TempSrc:  Oral Oral Oral  SpO2: 98%  98% 100%  Weight:      Height:        Constitutional: NAD, calm, comfortable, communicating well, complaining of pain Eyes: PERRL, lids and conjunctivae normal ENMT: Mucous membranes are moist. Posterior pharynx clear of any exudate or lesions. Neck: normal, supple, no masses, no thyromegaly Respiratory: clear to auscultation bilaterally, no wheezing, no crackles. Normal respiratory effort. No accessory muscle use.  Cardiovascular:  Regular rate and rhythm, no murmurs / rubs / gallops. No extremity edema. 2+ pedal pulses. No carotid bruits.  Abdomen: Abdomen is soft, generalized abdominal tenderness positive, no guarding, no rigidity, no masses palpated. No hepatosplenomegaly. Bowel sounds positive.  Musculoskeletal: no clubbing / cyanosis. No joint deformity upper and lower extremities. Good ROM, no contractures. Normal muscle tone.  Skin: Multiple bruises noted on bilateral lower extremities.   Neurologic: CN 2-12 grossly intact. Sensation intact, DTR normal. Strength 5/5 in all 4.  Psychiatric: Normal judgment and insight. Alert and oriented x 3. Normal mood.    Labs on Admission: I have personally reviewed following labs and imaging studies  CBC: Recent Labs  Lab 07/21/19 1740  WBC 16.3*  NEUTROABS 13.6*  HGB 7.8*  HCT 25.7*  MCV 73.9*  PLT 824   Basic Metabolic Panel: Recent Labs  Lab 07/21/19 1740  NA 139  K 4.1  CL 114*  CO2 16*  GLUCOSE 69*  BUN 41*  CREATININE 3.54*  CALCIUM 7.6*   GFR:  Estimated Creatinine Clearance: 15.5 mL/min (A) (by C-G formula based on SCr of 3.54 mg/dL (H)). Liver Function Tests: Recent Labs  Lab 07/21/19 1740  AST 13*  ALT 15  ALKPHOS 80  BILITOT 0.5  PROT 4.9*  ALBUMIN 2.9*   Recent Labs  Lab 07/21/19 1740  LIPASE 19   No results for input(s): AMMONIA in the last 168 hours. Coagulation Profile: No results for input(s): INR, PROTIME in the last 168 hours. Cardiac Enzymes: No results for input(s): CKTOTAL, CKMB, CKMBINDEX, TROPONINI in the last 168 hours. BNP (last 3 results) No results for input(s): PROBNP in the last 8760 hours. HbA1C: No results for input(s): HGBA1C in the last 72 hours. CBG: Recent Labs  Lab 07/22/19 0338 07/22/19 0523 07/22/19 0732 07/22/19 1032 07/22/19 1326  GLUCAP 88 86 80 86 76   Lipid Profile: No results for input(s): CHOL, HDL, LDLCALC, TRIG, CHOLHDL, LDLDIRECT in the last 72 hours. Thyroid Function Tests: No  results for input(s): TSH, T4TOTAL, FREET4, T3FREE, THYROIDAB in the last 72 hours. Anemia Panel: No results for input(s): VITAMINB12, FOLATE, FERRITIN, TIBC, IRON, RETICCTPCT in the last 72 hours. Urine analysis:    Component Value Date/Time   COLORURINE YELLOW 07/21/2019 1849   APPEARANCEUR CLOUDY (A) 07/21/2019 1849   LABSPEC 1.015 07/21/2019 1849   PHURINE 6.0 07/21/2019 1849   GLUCOSEU NEGATIVE 07/21/2019 1849   HGBUR LARGE (A) 07/21/2019 [place] 07/21/2019 [place] 07/21/2019 1849   PROTEINUR 30 (A) 07/21/2019 1849   NITRITE POSITIVE (A) 07/21/2019 1849   LEUKOCYTESUR MODERATE (A) 07/21/2019 1849    Radiological Exams on Admission: CT ABDOMEN PELVIS WO CONTRAST  Result Date: 07/21/2019 CLINICAL DATA:  Generalized abdominal pain for 3 days, vomiting EXAM: CT ABDOMEN AND PELVIS WITHOUT CONTRAST TECHNIQUE: Multidetector CT imaging of the abdomen and pelvis was performed following the standard protocol without IV contrast. COMPARISON:  01/15/2019 FINDINGS: Lower chest: Chronic consolidation at the left lung base favor atelectasis. Trace left pleural effusion versus pleural thickening unchanged. Right lung base is clear. Hepatobiliary: Gallbladder is distended without calcified gallstones or gallbladder wall thickening. No focal liver abnormalities. Pancreas: Unremarkable. No pancreatic ductal dilatation or surrounding inflammatory changes. Spleen: Multiple splenules are again seen within the left upper quadrant. No change. Adrenals/Urinary Tract: Postsurgical changes from left nephrectomy. No abnormalities within the nephrectomy bed. The right kidney displaced compensatory hypertrophy. No right-sided calculi or obstruction. Bladder is decompressed which limits the evaluation. The right adrenal is unremarkable. Stomach/Bowel: No bowel obstruction or ileus. Moderate retained stool primarily seen within the transverse colon. The appendix is not identified. No  inflammatory changes to suggest appendicitis. Vascular/Lymphatic: Aortic atherosclerosis. No enlarged abdominal or pelvic lymph nodes. Reproductive: Stable calcified exophytic uterine fibroid. No adnexal masses. Other: No abdominal wall hernia or abnormality. No abdominopelvic ascites. Musculoskeletal: Stable bilateral femoral head avascular necrosis. No acute displaced fractures. There is grade 2 anterolisthesis of L4 on L5. No pars defects. Reconstructed images demonstrate no additional findings. IMPRESSION: 1. Nonspecific gallbladder distension, without evidence of cholelithiasis or wall thickening. If gallbladder pathology is suspected, right upper quadrant ultrasound could be considered. 2. Stable postsurgical changes from left nephrectomy and splenectomy, with residual splenules unchanged. 3. Stable bilateral femoral head avascular necrosis. 4. Moderate retained stool. 5. Aortic Atherosclerosis (ICD10-I70.0). Electronically Signed   By: Randa Ngo M.D.   On: 07/21/2019 17:10   US Abdomen Limited RUQ  Result Date: 07/21/2019 CLINICAL DATA:  Nausea vomiting, generalized abdominal pain, abnormal CT EXAM: ULTRASOUND  ABDOMEN LIMITED RIGHT UPPER QUADRANT COMPARISON:  07/21/2019 FINDINGS: Gallbladder: Gallbladder is markedly distended measuring 12.1 cm in long axis. No evidence of cholelithiasis or biliary sludge. Gallbladder wall is not thickened measuring 3 mm. There is trace pericholecystic fluid, nonspecific. Negative sonographic Murphy sign. Common bile duct: Diameter: 10 mm Liver: Liver demonstrates normal echotexture. Mild periportal edema and intrahepatic biliary duct dilation. Portal vein is patent on color Doppler imaging with normal direction of blood flow towards the liver. Other: None. IMPRESSION: 1. Dilated common bile duct, with mild periportal edema and intrahepatic biliary duct dilation. Along with distended gallbladder, this raises the possibility of downstream common bile duct pathology.  ERCP or MRCP could be considered for follow-up. Please correlate with liver function tests. Electronically Signed   By: Randa Ngo M.D.   On: 07/21/2019 19:27    Assessment/Plan Principal Problem:   UTI (urinary tract infection) Active Problems:   Hypertension   Systemic lupus erythematosus (HCC)   AVN (avascular necrosis of bone) (HCC)   Renal cell carcinoma of left kidney s/p nephrectomy   Dehydration   SIRS (systemic inflammatory response syndrome) (HCC)   CKD (chronic kidney disease), stage IV (HCC)   Microcytic anemia   Abdominal pain   GERD (gastroesophageal reflux disease)   Hypothyroid    Generalized abdominal pain: -Patient has history of chronic pain syndrome. Wont let me touch her abdomen. -She is tachycardic, afebrile with leukocytosis of 16.3 (she is on chronic steroids for lupus), lipase: WNL, COVID-19 negative.  Reviewed right upper quadrant ultrasound and CT abdomen/pelvis.  UA positive for nitrites and leukocytes -Continue D5 half-normal saline.  Continue Rocephin.  Urine culture is pending -We will check lactic acid and blood culture -Tylenol/oxycodone/Dilaudid as needed for mild/moderate/severe pain respectively.  Zofran as needed for nausea and vomiting. -Advance her diet as tolerated.  Continue to monitor vitals closely.  Dilated CBD: -Patient liver enzymes: WNL. -Consulted GI-await recommendations.  AKI on CKD stage IV: -History of cell carcinoma status post left nephrectomy -Creatinine: 5 4, GFR: 15, previous creatinine: 3.30, GFR: 17 -Continue IV fluids.  Avoid nephrotoxic medication. -Repeat BMP tomorrow a.m.  Dehydration: Likely secondary to nausea and vomiting -CO2: 16, BUN: 41 -Continue IV fluids.  Zofran as needed for nausea and vomiting.  Microcytic anemia: Chronic -H&H is stable.  Continue to monitor  Lupus: Continue mycophenolate, prednisone and hydroxychloroquine  Hypertension: Continue amlodipine  Depression/anxiety: Continue  Cymbalta  Chronic pain syndrome: On Norco, Cymbalta, Lyrica at home.we will continue Cymbalta and Lyrica  GERD: Continue PPI  Hypoglycemia: Blood sugar runs in the 80s -On D5 half-normal saline.  Continue to monitor blood sugar closely.  AVN: Noted on CT -Oxycodone/Dilaudid as needed for pain control  Obesity with BMI of 29  Hypothyroidism: Check TSH -Continue levothyroxine  DVT prophylaxis: Heparin [place], TED/SCD Code Status: Full code-confirmed with the patient Family Communication: None present at bedside.  Plan of care discussed with patient in length and she verbalized understanding and agreed with it. Disposition Plan: To be determined Consults called: GI Admission status: Inpatient   Mckinley Jewel MD [facility]  If 7PM-7AM, please contact night-coverage [email or web address] Password [facility]  07/22/2019, 2:38 PM

## 2019-07-22 NOTE — ED Notes (Signed)
Checked CBG 86

## 2019-07-22 NOTE — ED Notes (Signed)
Checked CBG 83, RN Kaitlyn informed

## 2019-07-22 NOTE — Consult Note (Signed)
Referring Provider: Dr. Doristine Bosworth Primary Care Physician:  Beckie Salts, MD Primary Gastroenterologist:  Althia Forts  Reason for Consultation:  Abnormal ultrasound (dilated CBD)  HPI: Carolyn Zamora is a 64 y.o. female with past medical history below to include left renal cell carcinoma (s/p Lnephrectomy), splenectomy, CKD stage IV, chronic pain, Lupus (on chronic immunosuppressive therapy), and anemia presenting for consultation of CBD dilation on ultrasound.  Patient states she started experiencing nausea and vomiting last evening.  She started feeling progressively weaker, which led her to present to the ED. Denies any hematemesis.  She also reports diffuse abdominal pain.  Reports pain in the RUQ, as well as LUQ.  States she had shingles recently and has persistent LUQ pain, but RUQ pain is new-onset.  Patient states she has had some recent constipation and believes she has had some darker stools but is not entirely sure if they are black/melenic.  Denies any hematochezia.    She has a history of dysphagia and has an EGD with dilation in the past, but she is unable to report when or where this took place.  She is able to eat and drink most foods without difficulty but reports dysphagia with steak.  She hasn't had as much of an appetite lately and reports approximately an 8lb weight loss over the past month.  Denies any NSAID, anticoagulation, or aspirin use.  States she had a colonoscopy in the past which was normal but she is unsure of the dat this took place e.  Denies any family history of gastrointestinal malignancies.   Past Medical History:  Diagnosis Date  . Abdominal wall hematoma 08/01/2017  . Anemia   . AVN (avascular necrosis of bone) ([place]) 08/01/2017  . CKD (chronic kidney disease), stage IV ([place])   . History of dental abscess with sepsis 2017 08/02/2017  . History of kidney cancer   . Hypertension   . Lupus ([place])   . Renal cell carcinoma of left kidney s/p nephrectomy  06/21/2014  . Ventral hernia without obstruction or gangrene 10/24/2015    Past Surgical History:  Procedure Laterality Date  . INCISIONAL HERNIA REPAIR  11/19/2015   [facility].  Dr Phineas Douglas.  left VWH repair w mesh  . MULTIPLE TOOTH EXTRACTIONS    . NEPHRECTOMY    . ORIF ANKLE FRACTURE  11/16/2015   [facility], 2017  . SPLENECTOMY      Prior to Admission medications   Medication Sig Start Date End Date Taking? Authorizing Provider  amLODipine (NORVASC) 10 MG tablet Take 10 mg by mouth daily. 07/23/17   [provider]  cyanocobalamin (,VITAMIN B-12,) 1000 MCG/ML injection Inject 1 mL into the muscle every 30 (thirty) days. Inject around the 15th. 09/17/18   [provider]  dicyclomine (BENTYL) 20 MG tablet Take 1 tablet (20 mg total) by mouth 2 (two) times daily as needed for spasms. 01/25/18   Caccavale, Sophia, PA-C  DULoxetine (CYMBALTA) 60 MG capsule Take 60 mg by mouth 2 (two) times daily. 11/23/18   [provider]  furosemide (LASIX) 40 MG tablet Take 40 mg by mouth daily. 07/02/17   [provider]  HYDROcodone-acetaminophen (NORCO/VICODIN) 5-325 MG tablet Take 1 tablet by mouth every 8 (eight) hours as needed for pain. 12/15/15   [provider]  hydroxychloroquine (PLAQUENIL) 200 MG tablet Take 200 mg by mouth daily. 12/15/15   [provider]  hydrOXYzine (ATARAX/VISTARIL) 50 MG tablet Take 50 mg by mouth every morning.  07/06/17   [provider]  levothyroxine (SYNTHROID) 25 MCG tablet Take 25 mcg by mouth daily. 10/19/18   [provider]  mycophenolate (CELLCEPT) 500 MG tablet Take 500 mg by mouth every morning.  03/18/17   [provider]  pantoprazole (PROTONIX) 40 MG tablet Take 40 mg by mouth 2 (two) times daily. 11/23/18   [provider]  predniSONE (DELTASONE) 5 MG tablet Take 5 mg by mouth daily. 10/26/18   [provider]  pregabalin (LYRICA) 75 MG capsule Take 75 mg by mouth 2 (two)  times daily.  10/19/18   [provider]  tiZANidine (ZANAFLEX) 4 MG tablet Take 2 mg by mouth 2 (two) times daily as needed for muscle spasms. 1 to 2 times a day  01/04/19   [provider]  traZODone (DESYREL) 50 MG tablet Take 50 mg by mouth daily. 06/29/17   [provider]  Vitamin D, Ergocalciferol, (DRISDOL) 50000 units CAPS capsule Take 50,000 Units by mouth every Monday.  07/01/17   [provider]    Scheduled Meds: . amLODipine  10 mg Oral Daily  . DULoxetine  60 mg Oral BID  . furosemide  40 mg Oral Daily  . heparin  5,000 Units Subcutaneous Q8H  . hydroxychloroquine  200 mg Oral Daily  . [START ON 07/23/2019] levothyroxine  25 mcg Oral Q0600  . mycophenolate  500 mg Oral q morning - 10a  . pantoprazole  40 mg Oral BID  . predniSONE  5 mg Oral Daily  . pregabalin  75 mg Oral BID   Continuous Infusions: . cefTRIAXone (ROCEPHIN)  IV    . dextrose 5 % and 0.45% NaCl 75 mL/hr at 07/22/19 1408   PRN Meds:.acetaminophen **OR** acetaminophen, dicyclomine, HYDROmorphone (DILAUDID) injection, morphine injection, ondansetron **OR** ondansetron (ZOFRAN) IV, oxyCODONE  Allergies as of 07/21/2019  . (No Known Allergies)    Family History  Problem Relation Age of Onset  . Hypertension Mother   . Diabetes Mellitus I Mother   . Lung cancer Father   . Hypertension Sister   . Colon cancer Brother     Social History   Socioeconomic History  . Marital status: Single    Spouse name: Not on file  . Number of children: Not on file  . Years of education: Not on file  . Highest education level: Not on file  Occupational History  . Not on file  Tobacco Use  . Smoking status: Never Smoker  . Smokeless tobacco: Never Used  Vaping Use  . Vaping Use: Never used  Substance and Sexual Activity  . Alcohol use: No  . Drug use: No  . Sexual activity: Not on file  Other Topics Concern  . Not on file  Social History Narrative  . Not on file   Social  Determinants of Health   Financial Resource Strain:   . Difficulty of Paying Living Expenses:   Food Insecurity:   . Worried About Charity fundraiser in the Last Year:   . Arboriculturist in the Last Year:   Transportation Needs:   . Film/video editor (Medical):   Marland Kitchen Lack of Transportation (Non-Medical):   Physical Activity:   . Days of Exercise per Week:   . Minutes of Exercise per Session:   Stress:   . Feeling of Stress :   Social Connections:   . Frequency of Communication with Friends and Family:   . Frequency of Social Gatherings with Friends and Family:   . Attends Religious Services:   .  Active Member of Clubs or Organizations:   . Attends Archivist Meetings:   Marland Kitchen Marital Status:   Intimate Partner Violence:   . Fear of Current or Ex-Partner:   . Emotionally Abused:   Marland Kitchen Physically Abused:   . Sexually Abused:     Review of Systems: Review of Systems  Constitutional: Positive for malaise/fatigue and weight loss. Negative for chills and fever.  HENT: Negative for sore throat.   Eyes: Negative for pain and redness.  Respiratory: Negative for cough, shortness of breath and stridor.   Cardiovascular: Negative for chest pain and palpitations.  Gastrointestinal: Positive for abdominal pain (RUQ, LUQ), constipation, heartburn, melena (Possible), nausea and vomiting. Negative for blood in stool and diarrhea.  Genitourinary: Negative for flank pain and hematuria.  Musculoskeletal: Negative for back pain and joint pain.  Skin: Negative for itching and rash.  Neurological: Positive for weakness. Negative for loss of consciousness.  Endo/Heme/Allergies: Negative for polydipsia. Does not bruise/bleed easily.  Psychiatric/Behavioral: Negative for substance abuse. The patient is not nervous/anxious.      Physical Exam: Vital signs: Vitals:   07/22/19 1217 07/22/19 1312  BP: (!) 155/84   Pulse: 96   Resp:  16  Temp: 98 F (36.7 C) 97.9 F (36.6 C)  SpO2:  98% 100%      Physical Exam Constitutional:      General: She is awake. She is not in acute distress. HENT:     Head: Normocephalic and atraumatic.     Mouth/Throat:     Mouth: Mucous membranes are moist.     Pharynx: Oropharynx is clear.  Eyes:     General: No scleral icterus.    Extraocular Movements: Extraocular movements intact.  Cardiovascular:     Rate and Rhythm: Normal rate and regular rhythm.     Pulses: Normal pulses.     Heart sounds: Normal heart sounds.  Pulmonary:     Effort: Pulmonary effort is normal. No respiratory distress.     Breath sounds: Normal breath sounds.  Abdominal:     General: Bowel sounds are normal. There is no distension.     Palpations: Abdomen is soft. There is no mass.     Tenderness: There is abdominal tenderness (RUQ, LUQ, suprapubic). There is no guarding or rebound.  Musculoskeletal:        General: No swelling, tenderness or deformity.     Cervical back: Normal range of motion and neck supple.     Right lower leg: No edema.     Left lower leg: No edema.  Skin:    General: Skin is warm and dry.  Neurological:     General: No focal deficit present.     Mental Status: She is alert and oriented to person, place, and time.  Psychiatric:        Mood and Affect: Mood normal.        Behavior: Behavior normal. Behavior is cooperative.     GI:  Lab Results: Recent Labs    07/21/19 1740 07/22/19 1415  WBC 16.3* 14.4*  HGB 7.8* 8.9*  HCT 25.7* 29.7*  PLT 214 205   BMET Recent Labs    07/21/19 1740 07/22/19 1415  NA 139 139  K 4.1 4.5  CL 114* 114*  CO2 16* 16*  GLUCOSE 69* 83  BUN 41* 36*  CREATININE 3.54* 3.86*  CALCIUM 7.6* 7.8*   LFT Recent Labs    07/22/19 1415  PROT 5.2*  ALBUMIN 2.8*  AST 15  ALT 15  ALKPHOS 83  BILITOT 0.5   PT/INR No results for input(s): LABPROT, INR in the last 72 hours.   Studies/Results: CT ABDOMEN PELVIS WO CONTRAST  Result Date: 07/21/2019 CLINICAL DATA:  Generalized  abdominal pain for 3 days, vomiting EXAM: CT ABDOMEN AND PELVIS WITHOUT CONTRAST TECHNIQUE: Multidetector CT imaging of the abdomen and pelvis was performed following the standard protocol without IV contrast. COMPARISON:  01/15/2019 FINDINGS: Lower chest: Chronic consolidation at the left lung base favor atelectasis. Trace left pleural effusion versus pleural thickening unchanged. Right lung base is clear. Hepatobiliary: Gallbladder is distended without calcified gallstones or gallbladder wall thickening. No focal liver abnormalities. Pancreas: Unremarkable. No pancreatic ductal dilatation or surrounding inflammatory changes. Spleen: Multiple splenules are again seen within the left upper quadrant. No change. Adrenals/Urinary Tract: Postsurgical changes from left nephrectomy. No abnormalities within the nephrectomy bed. The right kidney displaced compensatory hypertrophy. No right-sided calculi or obstruction. Bladder is decompressed which limits the evaluation. The right adrenal is unremarkable. Stomach/Bowel: No bowel obstruction or ileus. Moderate retained stool primarily seen within the transverse colon. The appendix is not identified. No inflammatory changes to suggest appendicitis. Vascular/Lymphatic: Aortic atherosclerosis. No enlarged abdominal or pelvic lymph nodes. Reproductive: Stable calcified exophytic uterine fibroid. No adnexal masses. Other: No abdominal wall hernia or abnormality. No abdominopelvic ascites. Musculoskeletal: Stable bilateral femoral head avascular necrosis. No acute displaced fractures. There is grade 2 anterolisthesis of L4 on L5. No pars defects. Reconstructed images demonstrate no additional findings. IMPRESSION: 1. Nonspecific gallbladder distension, without evidence of cholelithiasis or wall thickening. If gallbladder pathology is suspected, right upper quadrant ultrasound could be considered. 2. Stable postsurgical changes from left nephrectomy and splenectomy, with residual  splenules unchanged. 3. Stable bilateral femoral head avascular necrosis. 4. Moderate retained stool. 5. Aortic Atherosclerosis (ICD10-I70.0). Electronically Signed   By: Randa Ngo M.D.   On: 07/21/2019 17:10   US Abdomen Limited RUQ  Result Date: 07/21/2019 CLINICAL DATA:  Nausea vomiting, generalized abdominal pain, abnormal CT EXAM: ULTRASOUND ABDOMEN LIMITED RIGHT UPPER QUADRANT COMPARISON:  07/21/2019 FINDINGS: Gallbladder: Gallbladder is markedly distended measuring 12.1 cm in long axis. No evidence of cholelithiasis or biliary sludge. Gallbladder wall is not thickened measuring 3 mm. There is trace pericholecystic fluid, nonspecific. Negative sonographic Murphy sign. Common bile duct: Diameter: 10 mm Liver: Liver demonstrates normal echotexture. Mild periportal edema and intrahepatic biliary duct dilation. Portal vein is patent on color Doppler imaging with normal direction of blood flow towards the liver. Other: None. IMPRESSION: 1. Dilated common bile duct, with mild periportal edema and intrahepatic biliary duct dilation. Along with distended gallbladder, this raises the possibility of downstream common bile duct pathology. ERCP or MRCP could be considered for follow-up. Please correlate with liver function tests. Electronically Signed   By: Randa Ngo M.D.   On: 07/21/2019 19:27    Impression: CBD dilation per ultrasound: Distended gallbladder without evidence of stones or sludge.  CBD dilated to 10 mm.  CT showed nonspecific gallbladder distention without stones. -LFTs remain within normal limits: T bili 0.5/AST 15/ALT 15/ALP 83 but patient has acute onset RUQ pain, nausea, and vomiting -Lipase normal (19)  Dysphagia with larger pieces of food (steak). History of EGD with dilation. ?melena: patient believes her stools are darker but is unsure  Chronic anemia: Hgb 8.9.  7.8 yesterday, which is consistent with baseline.  UTI: On Rocephin  CKD stage IV: BUN 36/Cr 3.86  History  of renal cell carcinoma  Plan: MRCP to further evaluate CBD  dilation.  Continue to trend LFTs.  Continue to trend H&H with transfusion as needed to maintain Hgb >7.  No signs of GI bleeding at this time, so we will defer endoscopy.  Continue Protonix 40mg  BID.  [facility] will follow.    LOS: 0 days   Salley Slaughter  PA-C 07/22/2019, 4:22 PM  Contact #  [phone]

## 2019-07-22 NOTE — ED Notes (Addendum)
Report called to [place], ETA [id number]

## 2019-07-23 DIAGNOSIS — N3000 Acute cystitis without hematuria: Secondary | ICD-10-CM

## 2019-07-23 LAB — COMPREHENSIVE METABOLIC PANEL
ALT: 14 U/L (ref 0–44)
AST: 16 U/L (ref 15–41)
Albumin: 2.4 g/dL — ABNORMAL LOW (ref 3.5–5.0)
Alkaline Phosphatase: 73 U/L (ref 38–126)
Anion gap: 8 (ref 5–15)
BUN: 33 mg/dL — ABNORMAL HIGH (ref 8–23)
CO2: 16 mmol/L — ABNORMAL LOW (ref 22–32)
Calcium: 7.5 mg/dL — ABNORMAL LOW (ref 8.9–10.3)
Chloride: 115 mmol/L — ABNORMAL HIGH (ref 98–111)
Creatinine, Ser: 3.75 mg/dL — ABNORMAL HIGH (ref 0.44–1.00)
GFR calc Af Amer: 14 mL/min — ABNORMAL LOW (ref >60)
GFR calc non Af Amer: 12 mL/min — ABNORMAL LOW (ref >60)
Glucose, Bld: 79 mg/dL (ref 70–99)
Potassium: 4.5 mmol/L (ref 3.5–5.1)
Sodium: 139 mmol/L (ref 135–145)
Total Bilirubin: 0.5 mg/dL (ref 0.3–1.2)
Total Protein: 4.5 g/dL — ABNORMAL LOW (ref 6.5–8.1)

## 2019-07-23 LAB — CBC
HCT: 26.3 % — ABNORMAL LOW (ref 36.0–46.0)
Hemoglobin: 7.6 g/dL — ABNORMAL LOW (ref 12.0–15.0)
MCH: 22 pg — ABNORMAL LOW (ref 26.0–34.0)
MCHC: 28.9 g/dL — ABNORMAL LOW (ref 30.0–36.0)
MCV: 76.2 fL — ABNORMAL LOW (ref 80.0–100.0)
Platelets: 166 10*3/uL (ref 150–400)
RBC: 3.45 MIL/uL — ABNORMAL LOW (ref 3.87–5.11)
RDW: 18 % — ABNORMAL HIGH (ref 11.5–15.5)
WBC: 10.7 10*3/uL — ABNORMAL HIGH (ref 4.0–10.5)
nRBC: 0 % (ref 0.0–0.2)

## 2019-07-23 LAB — GLUCOSE, CAPILLARY
Glucose-Capillary: 88 mg/dL (ref 70–99)
Glucose-Capillary: 85 mg/dL (ref 70–99)
Glucose-Capillary: 89 mg/dL (ref 70–99)
Glucose-Capillary: 97 mg/dL (ref 70–99)
Glucose-Capillary: 103 mg/dL — ABNORMAL HIGH (ref 70–99)
Glucose-Capillary: 97 mg/dL (ref 70–99)

## 2019-07-23 LAB — MAGNESIUM: Magnesium: 1.2 mg/dL — ABNORMAL LOW (ref 1.7–2.4)

## 2019-07-23 MED ORDER — SODIUM BICARBONATE-DEXTROSE 150-5 MEQ/L-% IV SOLN
150.0000 meq | INTRAVENOUS | Status: DC
  Administered 2019-07-23 – 2019-07-25 (×4): 150 meq via INTRAVENOUS
  Filled 2019-07-23 (×5): qty 1000

## 2019-07-23 MED ORDER — SODIUM BICARBONATE 8.4 % IV SOLN: INTRAVENOUS | Status: DC

## 2019-07-23 MED ORDER — INSULIN ASPART 100 UNIT/ML ~~LOC~~ SOLN
0.0000 [IU] | SUBCUTANEOUS | Status: DC
Start: 2019-07-23 — End: 2019-07-23

## 2019-07-23 MED ORDER — SODIUM CHLORIDE 0.9 % IV SOLN
1.0000 g | INTRAVENOUS | Status: DC
  Administered 2019-07-23 – 2019-07-25 (×3): 1 g via INTRAVENOUS
  Filled 2019-07-23: qty 10
  Filled 2019-07-23 (×2): qty 1

## 2019-07-23 MED ORDER — LEVOTHYROXINE SODIUM 75 MCG PO TABS
37.5000 ug | ORAL_TABLET | Freq: Every day | ORAL | Status: DC
  Administered 2019-07-24 – 2019-07-25 (×2): 37.5 ug via ORAL
  Filled 2019-07-23 (×2): qty 1

## 2019-07-23 NOTE — Progress Notes (Addendum)
Patient was asleep when I came in.  She states her abdominal pain is somewhat better.  MRCP does not show evidence of a common duct stone (although quality of study was somewhat limited by motion artifact).  Taking this finding into consideration, along with her repeatedly normal liver chemistries and her poor candidacy for invasive procedures, I do not think that further bile duct evaluation (for example, EUS) is necessary.  The findings of slight pancreatic edema and ductal dilatation on the MRCP were noted; her lipase is normal but I do think that, since she has abdominal symptoms, it would be prudent for the patient to have a follow-up MRCP without IV contrast in 3 months as per radiology suggestion, particularly if she continues to have abdominal pain following treatment of her pyelonephritis.  Since the patient lives in [facility] and gets her care there, I would recommend that this suggestion be conveyed to her primary physician as part of the patient's discharge instructions.   We will sign off.  Please call us if you have questions pertaining to this patient's care.  Cleotis Nipper, M.D. Pager [phone] If no answer or after 5 PM call [phone]

## 2019-07-23 NOTE — Progress Notes (Signed)
PROGRESS NOTE    Carolyn Zamora  IDP:[id number] DOB: Feb 19, 1955 DOA: 07/21/2019 PCP: Beckie Salts, MD   Brief Narrative:  Carolyn Zamora is a 64 y.o. female with medical history significant of left renal cell carcinoma status post left nephrectomy, history of splenectomy, CKD stage IV, chronic pain syndrome, morbid obesity, lupus on chronic immunosuppressive therapy, hypertension, anemia of chronic disease, avascular necrosis presented to [facility] with generalized abdominal pain, nausea, vomiting since 2 days  associated with nausea, vomiting, chills.  She denied dysuria, hematuria, foul-smelling urine, change in urinary frequency, however has history of UTI in the past.  She uses cane/walker for ambulation.  At [facility]: Patient tachycardic, blood pressure labile, highest 155/84, lowest: 90/68, leukocytosis of 16.3, lipase: WNL, UA positive for nitrites and leukocytes, COVID-19 negative.  CBC shows H&H of 7.8/25.7, MCV: 73.9, CMP shows CKD stage 4.  COVID-19 negative, right upper quadrant ultrasound shows dilated common bile duct with mild periportal edema and intrahepatic biliary duct dilation along with distended gallbladder.  Patient was given D5 half-normal saline (blood sugar: In the 80s), Zofran, morphine, Rocephin in the ED and transfer patient to [facility] due to dehydration, abdominal pain, UTI.  Assessment & Plan:   Principal Problem:   UTI (urinary tract infection) Active Problems:   Hypertension   Systemic lupus erythematosus (HCC)   AVN (avascular necrosis of bone) (HCC)   Renal cell carcinoma of left kidney s/p nephrectomy   Dehydration   SIRS (systemic inflammatory response syndrome) (HCC)   CKD (chronic kidney disease), stage IV (HCC)   Microcytic anemia   Abdominal pain   GERD (gastroesophageal reflux disease)   Hypothyroid   Generalized abdominal pain/dilated CBD/intrahepatic biliary duct dilatation: -Patient has history of  chronic pain syndrome.  Reviewed ultrasound, CT abdomen and MRCP which now shows pancreatic duct distention with mild peripancreatic edema but lipase: WNL. UA positive for nitrites and leukocytes.  No acute pathology on MRCP.  LFTs within normal range.  Will start on full liquid diet.  GI on board.  Continue current pain medications.  Acute right-sided pyelonephritis: MRCP shows signs of pyelonephritis.  UA consistent with UTI.  Continue Rocephin and follow culture and tailor antibiotics accordingly.  CKD stage V with 3 of renal cell carcinoma status post left nephrectomy: GFR at baseline.  Metabolic acidosis: We will start on bicarb drip.  Microcytic anemia: Chronic -H&H is stable.  Continue to monitor  Lupus: Continue mycophenolate, prednisone and hydroxychloroquine  Essential hypertension: Continue amlodipine  Depression/anxiety: Continue Cymbalta  Chronic pain syndrome: On Norco, Cymbalta, Lyrica at home.we will continue Cymbalta and Lyrica  GERD: Continue PPI  Hypoglycemia: Blood sugar runs in the 80s -On D5 half-normal saline.  Switch to D5 normal saline with sodium bicarb.  AVN: Noted on CT -Oxycodone/Dilaudid as needed for pain control  Obesity with BMI of 29  Hypothyroidism: Elevated TSH.  Will increase levothyroxine to 37.5 mcg.  Hypomagnesemia: Had low magnesium yesterday.  Rechecking today.   DVT prophylaxis: heparin injection 5,000 Units Start: 07/22/19 1400 SCDs Start: 07/22/19 1324 Place TED hose Start: 07/22/19 1324   Code Status: Full Code  Family Communication: None present at bedside.  Plan of care discussed with patient in length and he verbalized understanding and agreed with it.  Status is: Inpatient  Remains inpatient appropriate because:IV treatments appropriate due to intensity of illness or inability to take PO   Dispo: The patient is from: Home  Anticipated d/c is to: Home              Anticipated d/c date is: 2 days               Patient currently is not medically stable to d/c.        Estimated body mass index is 29.63 kg/m as calculated from the following:   Height as of this encounter: 5\' 2"  (1.575 m).   Weight as of this encounter: 73.5 kg.      Nutritional status:               Consultants:   GI  Procedures:   None  Antimicrobials:  Anti-infectives (From admission, onward)   Start     Dose/Rate Route Frequency Ordered Stop   07/23/19 1000  cefTRIAXone (ROCEPHIN) 1 g in sodium chloride 0.9 % 100 mL IVPB     Discontinue     1 g 200 mL/hr over 30 Minutes Intravenous Every 24 hours 07/23/19 0830     07/22/19 2000  cefTRIAXone (ROCEPHIN) 1 g in sodium chloride 0.9 % 100 mL IVPB        1 g 200 mL/hr over 30 Minutes Intravenous  Once 07/22/19 1500 07/22/19 2223   07/22/19 1000  hydroxychloroquine (PLAQUENIL) tablet 200 mg     Discontinue     200 mg Oral Daily 07/21/19 2345     07/21/19 2000  cefTRIAXone (ROCEPHIN) 1 g in sodium chloride 0.9 % 100 mL IVPB        1 g 200 mL/hr over 30 Minutes Intravenous  Once 07/21/19 1951 07/21/19 2037         Subjective: Seen and examined.  Continues to complain of abdominal pain which is chronic for her.  It is slightly better.  She rates it as 7 out of 10 whereas it was 8 out of 10 yesterday.  She has no other complaint.  When asked she tells me that even on a good day, her pain usually is around 6 or 7 out of 10.  Objective: Vitals:   07/22/19 1312 07/23/19 0028 07/23/19 0544 07/23/19 0900  BP:  (!) 147/71 (!) 150/64 (!) 135/40  Pulse:  96 100 99  Resp: 16 18 16 17   Temp: 97.9 F (36.6 C) 98.3 F (36.8 C) 98.1 F (36.7 C)   TempSrc: Oral Oral Oral   SpO2: 100% 100% 98% 97%  Weight:      Height:        Intake/Output Summary (Last 24 hours) at 07/23/2019 0947 Last data filed at 07/23/2019 0630 Gross per 24 hour  Intake 1722.52 ml  Output 500 ml  Net 1222.52 ml   Filed Weights   07/21/19 1524  Weight: 73.5 kg     Examination:  General exam: Appears calm and comfortable  Respiratory system: Clear to auscultation. Respiratory effort normal. Cardiovascular system: S1 & S2 heard, RRR. No JVD, murmurs, rubs, gallops or clicks. No pedal edema. Gastrointestinal system: Abdomen is nondistended, soft and generalized abdominal tenderness. No organomegaly or masses felt. Normal bowel sounds heard.  Tender to touch everywhere.  Likely psychosomatic. Central nervous system: Alert and oriented. No focal neurological deficits. Extremities: Symmetric 5 x 5 power. Skin: No rashes, lesions or ulcers Psychiatry: Judgement and insight appear normal. Mood & affect appropriate.    Data Reviewed: I have personally reviewed following labs and imaging studies  CBC: Recent Labs  Lab 07/21/19 1740 07/22/19 1415 07/23/19 0718  WBC 16.3* 14.4* 10.7*  NEUTROABS 13.6* 11.2*  --   HGB 7.8* 8.9* 7.6*  HCT 25.7* 29.7* 26.3*  MCV 73.9* 75.8* 76.2*  PLT 214 205 672   Basic Metabolic Panel: Recent Labs  Lab 07/21/19 1740 07/22/19 1415 07/23/19 0718  NA 139 139 139  K 4.1 4.5 4.5  CL 114* 114* 115*  CO2 16* 16* 16*  GLUCOSE 69* 83 79  BUN 41* 36* 33*  CREATININE 3.54* 3.86* 3.75*  CALCIUM 7.6* 7.8* 7.5*  MG  --  1.1*  --    GFR: Estimated Creatinine Clearance: 14.6 mL/min (A) (by C-G formula based on SCr of 3.75 mg/dL (H)). Liver Function Tests: Recent Labs  Lab 07/21/19 1740 07/22/19 1415 07/23/19 0718  AST 13* 15 16  ALT 15 15 14   ALKPHOS 80 83 73  BILITOT 0.5 0.5 0.5  PROT 4.9* 5.2* 4.5*  ALBUMIN 2.9* 2.8* 2.4*   Recent Labs  Lab 07/21/19 1740  LIPASE 19   No results for input(s): AMMONIA in the last 168 hours. Coagulation Profile: No results for input(s): INR, PROTIME in the last 168 hours. Cardiac Enzymes: No results for input(s): CKTOTAL, CKMB, CKMBINDEX, TROPONINI in the last 168 hours. BNP (last 3 results) No results for input(s): PROBNP in the last 8760 hours. HbA1C: No results  for input(s): HGBA1C in the last 72 hours. CBG: Recent Labs  Lab 07/22/19 1717 07/22/19 2100 07/22/19 2317 07/23/19 0405 07/23/19 0751  GLUCAP 85 91 90 88 85   Lipid Profile: No results for input(s): CHOL, HDL, LDLCALC, TRIG, CHOLHDL, LDLDIRECT in the last 72 hours. Thyroid Function Tests: Recent Labs    07/22/19 1415  TSH 7.892*   Anemia Panel: No results for input(s): VITAMINB12, FOLATE, FERRITIN, TIBC, IRON, RETICCTPCT in the last 72 hours. Sepsis Labs: Recent Labs  Lab 07/22/19 1532 07/22/19 2108  LATICACIDVEN 1.1 0.8    Recent Results (from the past 240 hour(s))  Urine culture     Status: Abnormal (Preliminary result)   Collection Time: 07/21/19  6:49 PM   Specimen: Urine, Random  Result Value Ref Range Status   Specimen Description   Final    URINE, RANDOM Performed at [facility], [address] [place], [place] [address]    Special Requests   Final    NONE Performed at [facility], [address] [place], [place] [address]    Culture >=100,000 COLONIES/mL ESCHERICHIA COLI (A)  Final   Report Status PENDING  Incomplete  SARS Coronavirus 2 by RT PCR (hospital order, performed in [place] hospital lab) Nasopharyngeal Nasopharyngeal Swab     Status: None   Collection Time: 07/21/19  8:08 PM   Specimen: Nasopharyngeal Swab  Result Value Ref Range Status   SARS Coronavirus 2 NEGATIVE NEGATIVE Final    Comment: (NOTE) SARS-CoV-2 target nucleic acids are NOT DETECTED.  The SARS-CoV-2 RNA is generally detectable in upper and lower respiratory specimens during the acute phase of infection. The lowest concentration of SARS-CoV-2 viral copies this assay can detect is 250 copies / mL. A negative result does not preclude SARS-CoV-2 infection and should not be used as the sole basis for treatment or other patient management decisions.  A negative result may occur with improper specimen collection / handling, submission of specimen  other than nasopharyngeal swab, presence of viral mutation(s) within the areas targeted by this assay, and inadequate number of viral copies (<250 copies / mL). A negative result must be combined with clinical observations, patient history,  and epidemiological information.  Fact Sheet for Patients:   StrictlyIdeas.no  Fact Sheet for Healthcare Providers: BankingDealers.co.za  This test is not yet approved or  cleared by the [place] FDA and has been authorized for detection and/or diagnosis of SARS-CoV-2 by FDA under an Emergency Use Authorization (EUA).  This EUA will remain in effect (meaning this test can be used) for the duration of the COVID-19 declaration under Section 564(b)(1) of the Act, 21 U.S.C. section 360bbb-3(b)(1), unless the authorization is terminated or revoked sooner.  Performed at [facility], [address] [place], [place] [address]   MRSA PCR Screening     Status: None   Collection Time: 07/22/19  2:59 PM   Specimen: Nasal Mucosa; Nasopharyngeal  Result Value Ref Range Status   MRSA by PCR NEGATIVE NEGATIVE Final    Comment:        The GeneXpert MRSA Assay (FDA approved for NASAL specimens only), is one component of a comprehensive MRSA colonization surveillance program. It is not intended to diagnose MRSA infection nor to guide or monitor treatment for MRSA infections. Performed at [facility], [place] [address] [place], [place] [address]       Radiology Studies: CT ABDOMEN PELVIS WO CONTRAST  Result Date: 07/21/2019 CLINICAL DATA:  Generalized abdominal pain for 3 days, vomiting EXAM: CT ABDOMEN AND PELVIS WITHOUT CONTRAST TECHNIQUE: Multidetector CT imaging of the abdomen and pelvis was performed following the standard protocol without IV contrast. COMPARISON:  01/15/2019 FINDINGS: Lower chest: Chronic consolidation at the left lung base favor atelectasis. Trace left pleural  effusion versus pleural thickening unchanged. Right lung base is clear. Hepatobiliary: Gallbladder is distended without calcified gallstones or gallbladder wall thickening. No focal liver abnormalities. Pancreas: Unremarkable. No pancreatic ductal dilatation or surrounding inflammatory changes. Spleen: Multiple splenules are again seen within the left upper quadrant. No change. Adrenals/Urinary Tract: Postsurgical changes from left nephrectomy. No abnormalities within the nephrectomy bed. The right kidney displaced compensatory hypertrophy. No right-sided calculi or obstruction. Bladder is decompressed which limits the evaluation. The right adrenal is unremarkable. Stomach/Bowel: No bowel obstruction or ileus. Moderate retained stool primarily seen within the transverse colon. The appendix is not identified. No inflammatory changes to suggest appendicitis. Vascular/Lymphatic: Aortic atherosclerosis. No enlarged abdominal or pelvic lymph nodes. Reproductive: Stable calcified exophytic uterine fibroid. No adnexal masses. Other: No abdominal wall hernia or abnormality. No abdominopelvic ascites. Musculoskeletal: Stable bilateral femoral head avascular necrosis. No acute displaced fractures. There is grade 2 anterolisthesis of L4 on L5. No pars defects. Reconstructed images demonstrate no additional findings. IMPRESSION: 1. Nonspecific gallbladder distension, without evidence of cholelithiasis or wall thickening. If gallbladder pathology is suspected, right upper quadrant ultrasound could be considered. 2. Stable postsurgical changes from left nephrectomy and splenectomy, with residual splenules unchanged. 3. Stable bilateral femoral head avascular necrosis. 4. Moderate retained stool. 5. Aortic Atherosclerosis (ICD10-I70.0). Electronically Signed   By: Randa Ngo M.D.   On: 07/21/2019 17:10   MR ABDOMEN MRCP WO CONTRAST  Result Date: 07/22/2019 CLINICAL DATA:  RIGHT upper quadrant pain, nondiagnostic ultrasound  EXAM: MRI ABDOMEN WITHOUT CONTRAST  (INCLUDING MRCP) TECHNIQUE: Multiplanar multisequence MR imaging of the abdomen was performed. Heavily T2-weighted images of the biliary and pancreatic ducts were obtained, and three-dimensional MRCP images were rendered by post processing. COMPARISON:  CT study 07/21/2019, CT abdomen and pelvis from January 15, 2019 FINDINGS: Lower chest: Small effusions bilaterally. Limited assessment of lung bases on MRI. Hepatobiliary: No focal, suspicious hepatic lesion.  Biliary ductal dilation without sign of filling defect. Gallbladder distension with some small pericholecystic fluid but in the setting of generalized edema in the RIGHT abdomen and RIGHT retroperitoneum. There is perinephric edema on the RIGHT as well. Mild pancreatic ductal dilation on MRCP images corresponding also with biliary duct distension. Maximal caliber of the common bile duct approximately 8 mm. Maximum caliber of the pancreatic duct in the head of the pancreas approximately 3-4 mm. Pancreas: Pancreatic ductal distension. Study limited without contrast. Study also limited by motion artifact. Ductal distension is mild and there is some peripheral atrophy. There is also mild peripancreatic edema suggested on T2 weighted images. Spleen:  Surgically absent with splenosis. Adrenals/Urinary Tract: LEFT nephrectomy. Peri nephric stranding on the RIGHT focal areas of signal variation in the kidneys with rounded appearance particularly on diffusion, for instance on image 86 of series 9 a 2.1 x 2.6 cm area of rounded diffusion related signal, patchy areas of signal abnormality elsewhere, less focal. No hydronephrosis. Adrenal glands not well evaluated given motion. No gross adrenal abnormality. Stomach/Bowel: Gastrointestinal tract with limited assessment, no acute finding. Vascular/Lymphatic: Vascular structures not well assessed given lack of intravenous contrast. No aneurysmal dilation of the abdominal aorta. Other: Small  amount of fluid about the liver and gallbladder. Retroperitoneal stranding on the RIGHT. Musculoskeletal: No acute musculoskeletal process to the extent evaluated. IMPRESSION: 1. Motion limited study though no sign of biliary ductal filling defect and mild to moderate ductal dilation. 2. Persistent gallbladder distension remains nonspecific. No biliary calculi are seen. If there is continued concern for biliary pathology HIDA scan could add specificity. 3. Signs of mild heterogeneity of diffusion related signal and perinephric stranding about the RIGHT kidney. Findings are likely related to pyelonephritis. Subtle focal areas in the RIGHT kidney may represent focal nephritis. Suggest follow-up to exclude the possibility of underlying renal lesions. 4. Pancreatic ductal distension with mild peripheral atrophy and mild peripancreatic edema. Correlation with lipase is suggested. Pancreatic ductal distension and atrophy may have been present on previous exams as a subtle finding. Suggest 3 month follow-up with MRI of the abdomen or evaluation with EUS as clinically warranted. 5. Small effusions bilaterally. 6. LEFT nephrectomy with splenosis. 7. Peri nephric stranding on the RIGHT as well. No hydronephrosis. Electronically Signed   By: Zetta Bills M.D.   On: 07/22/2019 21:02   US Abdomen Limited RUQ  Result Date: 07/21/2019 CLINICAL DATA:  Nausea vomiting, generalized abdominal pain, abnormal CT EXAM: ULTRASOUND ABDOMEN LIMITED RIGHT UPPER QUADRANT COMPARISON:  07/21/2019 FINDINGS: Gallbladder: Gallbladder is markedly distended measuring 12.1 cm in long axis. No evidence of cholelithiasis or biliary sludge. Gallbladder wall is not thickened measuring 3 mm. There is trace pericholecystic fluid, nonspecific. Negative sonographic Murphy sign. Common bile duct: Diameter: 10 mm Liver: Liver demonstrates normal echotexture. Mild periportal edema and intrahepatic biliary duct dilation. Portal vein is patent on color  Doppler imaging with normal direction of blood flow towards the liver. Other: None. IMPRESSION: 1. Dilated common bile duct, with mild periportal edema and intrahepatic biliary duct dilation. Along with distended gallbladder, this raises the possibility of downstream common bile duct pathology. ERCP or MRCP could be considered for follow-up. Please correlate with liver function tests. Electronically Signed   By: Randa Ngo M.D.   On: 07/21/2019 19:27    Scheduled Meds: . amLODipine  10 mg Oral Daily  . DULoxetine  60 mg Oral BID  . furosemide  40 mg Oral Daily  . heparin  5,000 Units Subcutaneous  Q8H  . hydroxychloroquine  200 mg Oral Daily  . [START ON 07/24/2019] levothyroxine  37.5 mcg Oral QAC breakfast  . mycophenolate  500 mg Oral q morning - 10a  . pantoprazole  40 mg Oral BID  . predniSONE  5 mg Oral Daily  . pregabalin  75 mg Oral BID   Continuous Infusions: . cefTRIAXone (ROCEPHIN)  IV 1 g (07/23/19 0935)  . dextrose 5 % and 0.45% NaCl 100 mL/hr at 07/22/19 2059     LOS: 1 day   Time spent: 37 minutes   Darliss Cheney, MD [facility]  07/23/2019, 9:47 AM   To contact the attending provider between 7A-7P or the covering provider during after hours 7P-7A, please log into the web site [email or web address].

## 2019-07-24 LAB — COMPREHENSIVE METABOLIC PANEL
ALT: 17 U/L (ref 0–44)
AST: 19 U/L (ref 15–41)
Albumin: 2.8 g/dL — ABNORMAL LOW (ref 3.5–5.0)
Alkaline Phosphatase: 71 U/L (ref 38–126)
Anion gap: 12 (ref 5–15)
BUN: 27 mg/dL — ABNORMAL HIGH (ref 8–23)
CO2: 22 mmol/L (ref 22–32)
Calcium: 7.7 mg/dL — ABNORMAL LOW (ref 8.9–10.3)
Chloride: 106 mmol/L (ref 98–111)
Creatinine, Ser: 3.28 mg/dL — ABNORMAL HIGH (ref 0.44–1.00)
GFR calc Af Amer: 17 mL/min — ABNORMAL LOW (ref >60)
GFR calc non Af Amer: 14 mL/min — ABNORMAL LOW (ref >60)
Glucose, Bld: 96 mg/dL (ref 70–99)
Potassium: 3.8 mmol/L (ref 3.5–5.1)
Sodium: 140 mmol/L (ref 135–145)
Total Bilirubin: 0.4 mg/dL (ref 0.3–1.2)
Total Protein: 5.1 g/dL — ABNORMAL LOW (ref 6.5–8.1)

## 2019-07-24 LAB — GLUCOSE, CAPILLARY
Glucose-Capillary: 102 mg/dL — ABNORMAL HIGH (ref 70–99)
Glucose-Capillary: 107 mg/dL — ABNORMAL HIGH (ref 70–99)
Glucose-Capillary: 82 mg/dL (ref 70–99)
Glucose-Capillary: 94 mg/dL (ref 70–99)
Glucose-Capillary: 97 mg/dL (ref 70–99)

## 2019-07-24 LAB — URINE CULTURE: Culture: >=100000 — AB

## 2019-07-24 LAB — CBC WITH DIFFERENTIAL/PLATELET
Abs Immature Granulocytes: 0.06 10*3/uL (ref 0.00–0.07)
Basophils Absolute: 0 10*3/uL (ref 0.0–0.1)
Basophils Relative: 0 %
Eosinophils Absolute: 0.1 10*3/uL (ref 0.0–0.5)
Eosinophils Relative: 1 %
HCT: 26.3 % — ABNORMAL LOW (ref 36.0–46.0)
Hemoglobin: 8 g/dL — ABNORMAL LOW (ref 12.0–15.0)
Immature Granulocytes: 1 %
Lymphocytes Relative: 18 %
Lymphs Abs: 2 10*3/uL (ref 0.7–4.0)
MCH: 22.3 pg — ABNORMAL LOW (ref 26.0–34.0)
MCHC: 30.4 g/dL (ref 30.0–36.0)
MCV: 73.5 fL — ABNORMAL LOW (ref 80.0–100.0)
Monocytes Absolute: 0.8 10*3/uL (ref 0.1–1.0)
Monocytes Relative: 8 %
Neutro Abs: 7.6 10*3/uL (ref 1.7–7.7)
Neutrophils Relative %: 72 %
Platelets: 149 10*3/uL — ABNORMAL LOW (ref 150–400)
RBC: 3.58 MIL/uL — ABNORMAL LOW (ref 3.87–5.11)
RDW: 17.2 % — ABNORMAL HIGH (ref 11.5–15.5)
WBC: 10.6 10*3/uL — ABNORMAL HIGH (ref 4.0–10.5)
nRBC: 0 % (ref 0.0–0.2)

## 2019-07-24 LAB — MAGNESIUM: Magnesium: 1.2 mg/dL — ABNORMAL LOW (ref 1.7–2.4)

## 2019-07-24 MED ORDER — MAGNESIUM SULFATE 2 GM/50ML IV SOLN
2.0000 g | Freq: Once | INTRAVENOUS | Status: AC
  Administered 2019-07-24: 2 g via INTRAVENOUS
  Filled 2019-07-24: qty 50

## 2019-07-24 NOTE — Evaluation (Signed)
Physical Therapy Evaluation Patient Details Name: Carolyn Zamora MRN: [id number] DOB: 17-Feb-1955 Today's Date: 07/24/2019   History of Present Illness  Patient is a 64 y/o female who presents with pain, N/V, chills.  Admitted with dehydration, abdominal pain and UTI. PMH includes renal cell carcinoma s/p left nephrectomy, splenectomy, CKD, chronic pain syndrome, morbid obesity, lupus on chronic immunosuppressive, HTN, anemia.  Clinical Impression  Patient presents with generalized weakness, impaired sensation, dyspnea on exertion and impaired mobility s/p above. Pt reports being Mod I for ADLs when she can and daughter does IADLs or helps with ADLs as needed. Pt reports hx of falls. Today, pt tolerated bed mobility, transfers and taking a few steps along side bed with use of RW and min guard for support. Declined further mobility. Pt does not want any follow up services including SNF or HHPT. Pt continues to be a high fall risk. Would really benefit from short term in rehab or HHPT to improve strength, mobility and decrease fall risk. Will follow acutely.    Follow Up Recommendations Home health PT;Supervision for mobility/OOB;Supervision/Assistance - 24 hour (pt refusing all services)    Equipment Recommendations  None recommended by PT    Recommendations for Other Services       Precautions / Restrictions Precautions Precautions: Fall Precaution Comments: hx of falls Restrictions Weight Bearing Restrictions: No      Mobility  Bed Mobility Overal bed mobility: Needs Assistance Bed Mobility: Supine to Sit;Sit to Supine     Supine to sit: Min assist;HOB elevated Sit to supine: Min guard;HOB elevated   General bed mobility comments: Assist with trunk to get to EOB; able to return self to supine without assist.  Transfers Overall transfer level: Needs assistance Equipment used: Rolling walker (2 wheeled) Transfers: Sit to/from Stand Sit to Stand: Mod assist          General transfer comment: Assist to power to standing with cues for hand placement/technique. Use of momentum. Bil knee weakness. SOB reported after minimal movement  Ambulation/Gait Ambulation/Gait assistance: Min guard Gait Distance (Feet): 3 Feet Assistive device: Rolling walker (2 wheeled) Gait Pattern/deviations: Wide base of support;Trunk flexed Gait velocity: decreased   General Gait Details: Able to side step along side bed with use of RW and min guard assist for support; bil knee instability but no buckling. Declined further mobility.  Stairs            Wheelchair Mobility    Modified Rankin (Stroke Patients Only)       Balance Overall balance assessment: Needs assistance Sitting-balance support: Feet unsupported;No upper extremity supported Sitting balance-Leahy Scale: Fair     Standing balance support: During functional activity Standing balance-Leahy Scale: Poor Standing balance comment: Requires UE support and close min guard.                             Pertinent Vitals/Pain Pain Assessment: Faces Faces Pain Scale: Hurts even more Pain Location: under bil breasts Pain Descriptors / Indicators: Sore;Discomfort Pain Intervention(s): Monitored during session;Repositioned    Home Living Family/patient expects to be discharged to:: Private residence Living Arrangements: Children (1 daughter) Available Help at Discharge: Family Type of Home: House Home Access: Level entry     Home Layout: One level Home Equipment: Environmental consultant - 4 wheels;Cane - single point      Prior Function Level of Independence: Independent with assistive device(s)         Comments: pt uses rollator  in the home and outside the home. Daughter assists with IADLs. Pt mostly dresses self but may need help. Hx of falls.     Hand Dominance   Dominant Hand: Right    Extremity/Trunk Assessment   Upper Extremity Assessment Upper Extremity Assessment: Defer to OT  evaluation    Lower Extremity Assessment Lower Extremity Assessment: Generalized weakness (Decreased sensation BLEs distal to knees.)       Communication   Communication: No difficulties  Cognition Arousal/Alertness: Awake/alert Behavior During Therapy: WFL for tasks assessed/performed Overall Cognitive Status: No family/caregiver present to determine baseline cognitive functioning                                 General Comments: Poor awareness of safety/deficits. Declining all follow up services despite having falls at home and feeling weak.      General Comments General comments (skin integrity, edema, etc.): HR in low 100s.    Exercises     Assessment/Plan    PT Assessment Patient needs continued PT services  PT Problem List Decreased strength;Decreased mobility;Decreased safety awareness;Pain;Impaired sensation;Decreased balance;Decreased activity tolerance;Cardiopulmonary status limiting activity;Decreased cognition       PT Treatment Interventions Therapeutic activities;Gait training;Therapeutic exercise;Patient/family education;Balance training;Functional mobility training    PT Goals (Current goals can be found in the Care Plan section)  Acute Rehab PT Goals Patient Stated Goal: to go home tomorrow PT Goal Formulation: With patient Time For Goal Achievement: 08/07/19 Potential to Achieve Goals: Fair    Frequency Min 3X/week   Barriers to discharge Decreased caregiver support home alone during the day    Co-evaluation               AM-PAC PT "6 Clicks" Mobility  Outcome Measure Help needed turning from your back to your side while in a flat bed without using bedrails?: None Help needed moving from lying on your back to sitting on the side of a flat bed without using bedrails?: A Little Help needed moving to and from a bed to a chair (including a wheelchair)?: A Lot Help needed standing up from a chair using your arms (e.g., wheelchair  or bedside chair)?: A Lot Help needed to walk in hospital room?: A Lot Help needed climbing 3-5 steps with a railing? : Total 6 Click Score: 14    End of Session   Activity Tolerance: Patient limited by fatigue;Other (comment);Patient limited by pain (SOB) Patient left: in bed;with call bell/phone within reach;with bed alarm set;with SCD's reapplied Nurse Communication: Mobility status PT Visit Diagnosis: Pain;Muscle weakness (generalized) (M62.81);Unsteadiness on feet (R26.81);Difficulty in walking, not elsewhere classified (R26.2) Pain - part of body:  (abdomen)    Time: 1572-6203 PT Time Calculation (min) (ACUTE ONLY): 23 min   Charges:   PT Evaluation $PT Eval Moderate Complexity: 1 Mod PT Treatments $Therapeutic Activity: 8-22 mins        Marisa Severin, PT, DPT Acute Rehabilitation Services Pager [phone] Office [phone]      Carolyn Zamora 07/24/2019, 2:09 PM

## 2019-07-24 NOTE — Progress Notes (Signed)
PROGRESS NOTE    Carolyn Zamora  EGB:[id number] DOB: 1955/11/08 DOA: 07/21/2019 PCP: Beckie Salts, MD   Brief Narrative:  Carolyn Zamora is a 64 y.o. female with medical history significant of left renal cell carcinoma status post left nephrectomy, history of splenectomy, CKD stage IV, chronic pain syndrome, morbid obesity, lupus on chronic immunosuppressive therapy, hypertension, anemia of chronic disease, avascular necrosis presented to [facility] with generalized abdominal pain, nausea, vomiting since 2 days  associated with nausea, vomiting, chills.  She denied dysuria, hematuria, foul-smelling urine, change in urinary frequency, however has history of UTI in the past.  She uses cane/walker for ambulation.  At [facility]: Patient tachycardic, blood pressure labile, highest 155/84, lowest: 90/68, leukocytosis of 16.3, lipase: WNL, UA positive for nitrites and leukocytes, COVID-19 negative.  CBC shows H&H of 7.8/25.7, MCV: 73.9, CMP shows CKD stage 4.  COVID-19 negative, right upper quadrant ultrasound shows dilated common bile duct with mild periportal edema and intrahepatic biliary duct dilation along with distended gallbladder.  Patient was given D5 half-normal saline (blood sugar: In the 80s), Zofran, morphine, Rocephin in the ED and transfer patient to [facility] due to dehydration, abdominal pain, UTI.  Assessment & Plan:   Principal Problem:   UTI (urinary tract infection) Active Problems:   Hypertension   Systemic lupus erythematosus (HCC)   AVN (avascular necrosis of bone) (HCC)   Renal cell carcinoma of left kidney s/p nephrectomy   Dehydration   SIRS (systemic inflammatory response syndrome) (HCC)   CKD (chronic kidney disease), stage IV (HCC)   Microcytic anemia   Abdominal pain   GERD (gastroesophageal reflux disease)   Hypothyroid   Generalized abdominal pain/dilated CBD/intrahepatic biliary duct dilatation: -Patient has history of  chronic pain syndrome.  Reviewed ultrasound, CT abdomen and MRCP which now shows pancreatic duct distention with mild peripancreatic edema but lipase: WNL. UA positive for nitrites and leukocytes.  No acute pathology on MRCP.  LFTs within normal range.  GI recommends no further plans.  Will advance to soft diet.  Continue current pain management.    Acute right-sided pyelonephritis: MRCP shows signs of pyelonephritis.  UA consistent with UTI.  Urine culture growing E. coli but final sensitivities pending.  Continue Rocephin and follow culture and tailor antibiotics accordingly.  CKD stage V with 3 of renal cell carcinoma status post left nephrectomy: GFR at baseline.  Metabolic acidosis: Resolved but will continue bicarb drip for another day.  Microcytic anemia: Chronic -H&H is stable.  Continue to monitor  Lupus: Continue mycophenolate, prednisone and hydroxychloroquine  Essential hypertension: Continue amlodipine  Depression/anxiety: Continue Cymbalta  Chronic pain syndrome: On Norco, Cymbalta, Lyrica at home.we will continue Cymbalta and Lyrica  GERD: Continue PPI  Hypoglycemia: Blood sugar runs in the 80s -On D5 with bicarb.  AVN: Noted on CT -Oxycodone/Dilaudid as needed for pain control  Obesity with BMI of 29  Hypothyroidism: Elevated TSH.  Increased levothyroxine to 37.5 mcg.  Hypomagnesemia: We will replace and recheck later today.   DVT prophylaxis: heparin injection 5,000 Units Start: 07/22/19 1400 SCDs Start: 07/22/19 1324 Place TED hose Start: 07/22/19 1324   Code Status: Full Code  Family Communication: None present at bedside.  Plan of care discussed with patient in length and he verbalized understanding and agreed with it.  Status is: Inpatient  Remains inpatient appropriate because:IV treatments appropriate due to intensity of illness or inability to take PO   Dispo: The patient is from: Home  Anticipated d/c is to: Home               Anticipated d/c date is: 1 day              Patient currently is not medically stable to d/c.     Estimated body mass index is 30.97 kg/m as calculated from the following:   Height as of this encounter: 5\' 2"  (1.575 m).   Weight as of this encounter: 76.8 kg.      Nutritional status:               Consultants:   GI  Procedures:   None  Antimicrobials:  Anti-infectives (From admission, onward)   Start     Dose/Rate Route Frequency Ordered Stop   07/23/19 1000  cefTRIAXone (ROCEPHIN) 1 g in sodium chloride 0.9 % 100 mL IVPB     Discontinue     1 g 200 mL/hr over 30 Minutes Intravenous Every 24 hours 07/23/19 0830     07/22/19 2000  cefTRIAXone (ROCEPHIN) 1 g in sodium chloride 0.9 % 100 mL IVPB        1 g 200 mL/hr over 30 Minutes Intravenous  Once 07/22/19 1500 07/22/19 2223   07/22/19 1000  hydroxychloroquine (PLAQUENIL) tablet 200 mg     Discontinue     200 mg Oral Daily 07/21/19 2345     07/21/19 2000  cefTRIAXone (ROCEPHIN) 1 g in sodium chloride 0.9 % 100 mL IVPB        1 g 200 mL/hr over 30 Minutes Intravenous  Once 07/21/19 1951 07/21/19 2037         Subjective: Patient seen and examined.  She has no more abdominal pain or any other complaint.  Despite of this, when I started conversation about going home, she is not comfortable going home and is requesting to keep her at home as she does not have any help at home.  Objective: Vitals:   07/23/19 2318 07/24/19 0539 07/24/19 0553 07/24/19 0812  BP: 121/63 (!) 145/62  (!) 151/90  Pulse: 75 95  75  Resp: 17 15  16   Temp: 99.1 F (37.3 C) 98 F (36.7 C)    TempSrc: Oral Oral    SpO2: 100% 100%  100%  Weight:   76.8 kg   Height:        Intake/Output Summary (Last 24 hours) at 07/24/2019 1057 Last data filed at 07/24/2019 1003 Gross per 24 hour  Intake 1576.41 ml  Output 2000 ml  Net -423.59 ml   Filed Weights   07/21/19 1524 07/23/19 0751 07/24/19 0553  Weight: 73.5 kg 83.5 kg 76.8 kg      Examination:  General exam: Appears calm and comfortable  Respiratory system: Clear to auscultation. Respiratory effort normal. Cardiovascular system: S1 & S2 heard, RRR. No JVD, murmurs, rubs, gallops or clicks. No pedal edema. Gastrointestinal system: Abdomen is nondistended, soft and nontender. No organomegaly or masses felt. Normal bowel sounds heard. Central nervous system: Alert and oriented. No focal neurological deficits. Extremities: Symmetric 5 x 5 power. Skin: No rashes, lesions or ulcers.  Psychiatry: Judgement and insight appear normal. Mood & affect appropriate.   Data Reviewed: I have personally reviewed following labs and imaging studies  CBC: Recent Labs  Lab 07/21/19 1740 07/22/19 1415 07/23/19 0718 07/24/19 0619  WBC 16.3* 14.4* 10.7* 10.6*  NEUTROABS 13.6* 11.2*  --  7.6  HGB 7.8* 8.9* 7.6* 8.0*  HCT 25.7* 29.7* 26.3* 26.3*  MCV  73.9* 75.8* 76.2* 73.5*  PLT 214 205 166 388*   Basic Metabolic Panel: Recent Labs  Lab 07/21/19 1740 07/22/19 1415 07/23/19 0718 07/24/19 0619  NA 139 139 139 140  K 4.1 4.5 4.5 3.8  CL 114* 114* 115* 106  CO2 16* 16* 16* 22  GLUCOSE 69* 83 79 96  BUN 41* 36* 33* 27*  CREATININE 3.54* 3.86* 3.75* 3.28*  CALCIUM 7.6* 7.8* 7.5* 7.7*  MG  --  1.1* 1.2*  --    GFR: Estimated Creatinine Clearance: 17.1 mL/min (A) (by C-G formula based on SCr of 3.28 mg/dL (H)). Liver Function Tests: Recent Labs  Lab 07/21/19 1740 07/22/19 1415 07/23/19 0718 07/24/19 0619  AST 13* 15 16 19   ALT 15 15 14 17   ALKPHOS 80 83 73 71  BILITOT 0.5 0.5 0.5 0.4  PROT 4.9* 5.2* 4.5* 5.1*  ALBUMIN 2.9* 2.8* 2.4* 2.8*   Recent Labs  Lab 07/21/19 1740  LIPASE 19   No results for input(s): AMMONIA in the last 168 hours. Coagulation Profile: No results for input(s): INR, PROTIME in the last 168 hours. Cardiac Enzymes: No results for input(s): CKTOTAL, CKMB, CKMBINDEX, TROPONINI in the last 168 hours. BNP (last 3 results) No results  for input(s): PROBNP in the last 8760 hours. HbA1C: No results for input(s): HGBA1C in the last 72 hours. CBG: Recent Labs  Lab 07/23/19 1102 07/23/19 1606 07/23/19 1940 07/23/19 2325 07/24/19 0442  GLUCAP 89 97 103* 97 82   Lipid Profile: No results for input(s): CHOL, HDL, LDLCALC, TRIG, CHOLHDL, LDLDIRECT in the last 72 hours. Thyroid Function Tests: Recent Labs    07/22/19 1415  TSH 7.892*   Anemia Panel: No results for input(s): VITAMINB12, FOLATE, FERRITIN, TIBC, IRON, RETICCTPCT in the last 72 hours. Sepsis Labs: Recent Labs  Lab 07/22/19 1532 07/22/19 2108  LATICACIDVEN 1.1 0.8    Recent Results (from the past 240 hour(s))  Urine culture     Status: Abnormal   Collection Time: 07/21/19  6:49 PM   Specimen: Urine, Random  Result Value Ref Range Status   Specimen Description   Final    URINE, RANDOM Performed at [facility], [address] [place], [place] [address]    Special Requests   Final    NONE Performed at [facility], [address] [place], [place] [address]    Culture >=100,000 COLONIES/mL ESCHERICHIA COLI (A)  Final   Report Status 07/24/2019 FINAL  Final   Organism ID, Bacteria ESCHERICHIA COLI (A)  Final      Susceptibility   Escherichia coli - MIC*    AMPICILLIN >=32 RESISTANT Resistant     CEFAZOLIN <=4 SENSITIVE Sensitive     CEFTRIAXONE <=1 SENSITIVE Sensitive     CIPROFLOXACIN <=0.25 SENSITIVE Sensitive     GENTAMICIN <=1 SENSITIVE Sensitive     IMIPENEM <=0.25 SENSITIVE Sensitive     NITROFURANTOIN <=16 SENSITIVE Sensitive     TRIMETH/SULFA >=320 RESISTANT Resistant     AMPICILLIN/SULBACTAM >=32 RESISTANT Resistant     PIP/TAZO 16 SENSITIVE Sensitive     * >=100,000 COLONIES/mL ESCHERICHIA COLI  SARS Coronavirus 2 by RT PCR (hospital order, performed in [facility] hospital lab) Nasopharyngeal Nasopharyngeal Swab     Status: None   Collection Time: 07/21/19  8:08 PM   Specimen: Nasopharyngeal Swab    Result Value Ref Range Status   SARS Coronavirus 2 NEGATIVE NEGATIVE Final    Comment: (NOTE) SARS-CoV-2 target nucleic acids  are NOT DETECTED.  The SARS-CoV-2 RNA is generally detectable in upper and lower respiratory specimens during the acute phase of infection. The lowest concentration of SARS-CoV-2 viral copies this assay can detect is 250 copies / mL. A negative result does not preclude SARS-CoV-2 infection and should not be used as the sole basis for treatment or other patient management decisions.  A negative result may occur with improper specimen collection / handling, submission of specimen other than nasopharyngeal swab, presence of viral mutation(s) within the areas targeted by this assay, and inadequate number of viral copies (<250 copies / mL). A negative result must be combined with clinical observations, patient history, and epidemiological information.  Fact Sheet for Patients:   StrictlyIdeas.no  Fact Sheet for Healthcare Providers: BankingDealers.co.za  This test is not yet approved or  cleared by the [place] FDA and has been authorized for detection and/or diagnosis of SARS-CoV-2 by FDA under an Emergency Use Authorization (EUA).  This EUA will remain in effect (meaning this test can be used) for the duration of the COVID-19 declaration under Section 564(b)(1) of the Act, 21 U.S.C. section 360bbb-3(b)(1), unless the authorization is terminated or revoked sooner.  Performed at [facility], [address] [place], [place] [address]   MRSA PCR Screening     Status: None   Collection Time: 07/22/19  2:59 PM   Specimen: Nasal Mucosa; Nasopharyngeal  Result Value Ref Range Status   MRSA by PCR NEGATIVE NEGATIVE Final    Comment:        The GeneXpert MRSA Assay (FDA approved for NASAL specimens only), is one component of a comprehensive MRSA colonization surveillance program. It is  not intended to diagnose MRSA infection nor to guide or monitor treatment for MRSA infections. Performed at [facility], [place] [address] [place], [place] [address]   Culture, blood (routine x 2)     Status: None (Preliminary result)   Collection Time: 07/22/19  3:32 PM   Specimen: BLOOD  Result Value Ref Range Status   Specimen Description BLOOD FOOT R  Final   Special Requests   Final    BOTTLES DRAWN AEROBIC ONLY Blood Culture results may not be optimal due to an inadequate volume of blood received in culture bottles   Culture   Final    NO GROWTH 2 DAYS Performed at [facility], [place] [address], [place], [place] [address]    Report Status PENDING  Incomplete  Culture, blood (routine x 2)     Status: None (Preliminary result)   Collection Time: 07/22/19  3:32 PM   Specimen: BLOOD  Result Value Ref Range Status   Specimen Description BLOOD  LEFT FOOT  Final   Special Requests   Final    BOTTLES DRAWN AEROBIC ONLY Blood Culture adequate volume   Culture   Final    NO GROWTH 2 DAYS Performed at [facility], [place] [address] [place], [place] [address]    Report Status PENDING  Incomplete      Radiology Studies: MR ABDOMEN MRCP WO CONTRAST  Result Date: 07/22/2019 CLINICAL DATA:  RIGHT upper quadrant pain, nondiagnostic ultrasound EXAM: MRI ABDOMEN WITHOUT CONTRAST  (INCLUDING MRCP) TECHNIQUE: Multiplanar multisequence MR imaging of the abdomen was performed. Heavily T2-weighted images of the biliary and pancreatic ducts were obtained, and three-dimensional MRCP images were rendered by post processing. COMPARISON:  CT study 07/21/2019, CT abdomen and pelvis from January 15, 2019 FINDINGS: Lower chest: Small  effusions bilaterally. Limited assessment of lung bases on MRI. Hepatobiliary: No focal, suspicious hepatic lesion. Biliary ductal dilation without sign of filling defect. Gallbladder distension with some small pericholecystic fluid but in the setting of  generalized edema in the RIGHT abdomen and RIGHT retroperitoneum. There is perinephric edema on the RIGHT as well. Mild pancreatic ductal dilation on MRCP images corresponding also with biliary duct distension. Maximal caliber of the common bile duct approximately 8 mm. Maximum caliber of the pancreatic duct in the head of the pancreas approximately 3-4 mm. Pancreas: Pancreatic ductal distension. Study limited without contrast. Study also limited by motion artifact. Ductal distension is mild and there is some peripheral atrophy. There is also mild peripancreatic edema suggested on T2 weighted images. Spleen:  Surgically absent with splenosis. Adrenals/Urinary Tract: LEFT nephrectomy. Peri nephric stranding on the RIGHT focal areas of signal variation in the kidneys with rounded appearance particularly on diffusion, for instance on image 86 of series 9 a 2.1 x 2.6 cm area of rounded diffusion related signal, patchy areas of signal abnormality elsewhere, less focal. No hydronephrosis. Adrenal glands not well evaluated given motion. No gross adrenal abnormality. Stomach/Bowel: Gastrointestinal tract with limited assessment, no acute finding. Vascular/Lymphatic: Vascular structures not well assessed given lack of intravenous contrast. No aneurysmal dilation of the abdominal aorta. Other: Small amount of fluid about the liver and gallbladder. Retroperitoneal stranding on the RIGHT. Musculoskeletal: No acute musculoskeletal process to the extent evaluated. IMPRESSION: 1. Motion limited study though no sign of biliary ductal filling defect and mild to moderate ductal dilation. 2. Persistent gallbladder distension remains nonspecific. No biliary calculi are seen. If there is continued concern for biliary pathology HIDA scan could add specificity. 3. Signs of mild heterogeneity of diffusion related signal and perinephric stranding about the RIGHT kidney. Findings are likely related to pyelonephritis. Subtle focal areas in  the RIGHT kidney may represent focal nephritis. Suggest follow-up to exclude the possibility of underlying renal lesions. 4. Pancreatic ductal distension with mild peripheral atrophy and mild peripancreatic edema. Correlation with lipase is suggested. Pancreatic ductal distension and atrophy may have been present on previous exams as a subtle finding. Suggest 3 month follow-up with MRI of the abdomen or evaluation with EUS as clinically warranted. 5. Small effusions bilaterally. 6. LEFT nephrectomy with splenosis. 7. Peri nephric stranding on the RIGHT as well. No hydronephrosis. Electronically Signed   By: Zetta Bills M.D.   On: 07/22/2019 21:02    Scheduled Meds: . amLODipine  10 mg Oral Daily  . DULoxetine  60 mg Oral BID  . furosemide  40 mg Oral Daily  . heparin  5,000 Units Subcutaneous Q8H  . hydroxychloroquine  200 mg Oral Daily  . levothyroxine  37.5 mcg Oral QAC breakfast  . mycophenolate  500 mg Oral q morning - 10a  . pantoprazole  40 mg Oral BID  . predniSONE  5 mg Oral Daily  . pregabalin  75 mg Oral BID   Continuous Infusions: . cefTRIAXone (ROCEPHIN)  IV 1 g (07/24/19 0819)  . sodium bicarbonate 150 mEq in dextrose 5% 1000 mL 150 mEq (07/23/19 2312)     LOS: 2 days   Time spent: 30 minutes   Darliss Cheney, MD [facility]  07/24/2019, 10:57 AM   To contact the attending provider between 7A-7P or the covering provider during after hours 7P-7A, please log into the web site [email or web address].

## 2019-07-25 ENCOUNTER — Inpatient Hospital Stay (HOSPITAL_COMMUNITY): Payer: Medicare Other

## 2019-07-25 LAB — CBC WITH DIFFERENTIAL/PLATELET
Abs Immature Granulocytes: 0.04 10*3/uL (ref 0.00–0.07)
Basophils Absolute: 0 10*3/uL (ref 0.0–0.1)
Basophils Relative: 0 %
Eosinophils Absolute: 0.1 10*3/uL (ref 0.0–0.5)
Eosinophils Relative: 2 %
HCT: 25.4 % — ABNORMAL LOW (ref 36.0–46.0)
Hemoglobin: 7.7 g/dL — ABNORMAL LOW (ref 12.0–15.0)
Immature Granulocytes: 1 %
Lymphocytes Relative: 30 %
Lymphs Abs: 2 10*3/uL (ref 0.7–4.0)
MCH: 21.9 pg — ABNORMAL LOW (ref 26.0–34.0)
MCHC: 30.3 g/dL (ref 30.0–36.0)
MCV: 72.4 fL — ABNORMAL LOW (ref 80.0–100.0)
Monocytes Absolute: 0.7 10*3/uL (ref 0.1–1.0)
Monocytes Relative: 10 %
Neutro Abs: 3.9 10*3/uL (ref 1.7–7.7)
Neutrophils Relative %: 57 %
Platelets: 142 10*3/uL — ABNORMAL LOW (ref 150–400)
RBC: 3.51 MIL/uL — ABNORMAL LOW (ref 3.87–5.11)
RDW: 16.5 % — ABNORMAL HIGH (ref 11.5–15.5)
WBC: 6.8 10*3/uL (ref 4.0–10.5)
nRBC: 0.3 % — ABNORMAL HIGH (ref 0.0–0.2)

## 2019-07-25 LAB — COMPREHENSIVE METABOLIC PANEL
ALT: 15 U/L (ref 0–44)
AST: 20 U/L (ref 15–41)
Albumin: 2.7 g/dL — ABNORMAL LOW (ref 3.5–5.0)
Alkaline Phosphatase: 74 U/L (ref 38–126)
Anion gap: 13 (ref 5–15)
BUN: 21 mg/dL (ref 8–23)
CO2: 29 mmol/L (ref 22–32)
Calcium: 7.4 mg/dL — ABNORMAL LOW (ref 8.9–10.3)
Chloride: 99 mmol/L (ref 98–111)
Creatinine, Ser: 2.89 mg/dL — ABNORMAL HIGH (ref 0.44–1.00)
GFR calc Af Amer: 19 mL/min — ABNORMAL LOW (ref >60)
GFR calc non Af Amer: 17 mL/min — ABNORMAL LOW (ref >60)
Glucose, Bld: 124 mg/dL — ABNORMAL HIGH (ref 70–99)
Potassium: 3.5 mmol/L (ref 3.5–5.1)
Sodium: 141 mmol/L (ref 135–145)
Total Bilirubin: 0.6 mg/dL (ref 0.3–1.2)
Total Protein: 4.9 g/dL — ABNORMAL LOW (ref 6.5–8.1)

## 2019-07-25 LAB — GLUCOSE, CAPILLARY
Glucose-Capillary: 83 mg/dL (ref 70–99)
Glucose-Capillary: 106 mg/dL — ABNORMAL HIGH (ref 70–99)

## 2019-07-25 MED ORDER — LEVOFLOXACIN 750 MG PO TABS: 750.0000 mg | ORAL_TABLET | ORAL | 0 refills | Status: AC

## 2019-07-25 NOTE — Progress Notes (Signed)
Pt given discharge summary and discharged via daughter as transportation.  

## 2019-07-25 NOTE — Discharge Summary (Signed)
Physician Discharge Summary  Carolyn Zamora JKK:[id number] DOB: 1955-12-11 DOA: 07/21/2019  PCP: Beckie Salts, MD  Admit date: 07/21/2019 Discharge date: 07/25/2019  Admitted From: Home Disposition: Home  Recommendations for Outpatient Follow-up:  1. Follow up with PCP in 1-2 weeks 2. Get MRI of bilateral hip as outpatient 3. Please obtain BMP/CBC in one week 4. Please follow up with your PCP on the following pending results: Unresulted Labs (From admission, onward) Comment          Start     Ordered   07/24/19 0500  CBC with Differential/Platelet  Daily,   R      07/23/19 1002           Home Health: Yes Equipment/Devices: None  Discharge Condition: Stable CODE STATUS: Full code Diet recommendation: Cardiac  Subjective: Seen and examined.  Feeling much better.  No abdominal pain or flank pain.  No fever.  No dysuria.  Happy to go home.  Brief/Interim Summary: Elexis Pollak a 64 y.o.femalewith medical history significant ofleft renal cell carcinoma status post left nephrectomy, history of splenectomy, CKD stage IV, chronic pain syndrome, morbid obesity, lupus on chronic immunosuppressive therapy, hypertension, anemia of chronic disease, avascular necrosis presented to [place] with generalized abdominal pain, nausea, vomiting since 2 days  associated with nausea, vomiting, chills. She denied dysuria, hematuria, foul-smelling urine, change in urinary frequency, however has history of UTI in the past. She uses cane/walker for ambulation.  At [facility]: Patient tachycardic, blood pressure labile, highest 155/84, lowest: 90/68, leukocytosis of 16.3, lipase: WNL, UA positive for nitrites and leukocytes, COVID-19 negative. CBC shows H&H of 7.8/25.7, MCV: 73.9, CMP shows CKD stage 4. COVID-19 negative, right upper quadrant ultrasound shows dilated common bile duct with mild periportal edema and intrahepatic biliary duct dilation along with  distended gallbladder. Patient was given D5 half-normal saline (blood sugar: In the 80s), Zofran, morphine, Rocephin in the ED and transfer patient to [facility] due to dehydration, abdominal pain, UTI.  Patient was continued on Rocephin.  Subsequently her urine culture grew E. coli which was sensitive to Rocephin as well as fluoroquinolones but resistant to Bactrim and ampicillin.  Patient has improved significantly.  Blood culture remain negative.  She was evaluated by PT OT and they recommended home health.  Patient today complained of left lower extremity pain that she has been having for last 1 month.  She also fell 2 weeks ago.  X-ray of pelvis and left hip was obtained which indicates possibility of early signs of avascular necrosis on the left and avascular source of the right.  Left ankle x-ray was obtained which also ruled out any fracture.  There is no fracture at the hip.  Neurologist recommends MRI of the leg however she was evaluated by PT OT and she walked well and PT did not even recommend any device for ambulation.  Based on this, this issue seems to be nonurgent and for that reason, patient will be discharged home and I recommend that she has MRI of bilateral hips as outpatient.  Based on her sensitivities, I am discharging her on Levaquin 750 mg p.o. every 48 hours for 3 more doses.  Discharge Diagnoses:  Principal Problem:   UTI (urinary tract infection) Active Problems:   Hypertension   Systemic lupus erythematosus (HCC)   AVN (avascular necrosis of bone) (HCC)   Renal cell carcinoma of left kidney s/p nephrectomy   Dehydration   SIRS (systemic inflammatory response syndrome) ([place])  CKD (chronic kidney disease), stage IV (HCC)   Microcytic anemia   Abdominal pain   GERD (gastroesophageal reflux disease)   Hypothyroid    Discharge Instructions   Allergies as of 07/25/2019   No Known Allergies     Medication List    TAKE these medications   amLODipine 10 MG  tablet Commonly known as: NORVASC Take 10 mg by mouth every morning.   cyanocobalamin 1000 MCG/ML injection Commonly known as: (VITAMIN B-12) Inject 1 mL into the muscle every 30 (thirty) days. On or about the end of each month   dicyclomine 20 MG tablet Commonly known as: BENTYL Take 1 tablet (20 mg total) by mouth 2 (two) times daily as needed for spasms. What changed: when to take this   DULoxetine 60 MG capsule Commonly known as: CYMBALTA Take 60 mg by mouth 2 (two) times daily.   furosemide 40 MG tablet Commonly known as: LASIX Take 40 mg by mouth every morning.   HYDROcodone-acetaminophen 5-325 MG tablet Commonly known as: NORCO/VICODIN Take 1 tablet by mouth 2 (two) times daily.   hydroxychloroquine 200 MG tablet Commonly known as: PLAQUENIL Take 200 mg by mouth every morning.   hydrOXYzine 50 MG tablet Commonly known as: ATARAX/VISTARIL Take 50 mg by mouth every morning.   levofloxacin 750 MG tablet Commonly known as: Levaquin Take 1 tablet (750 mg total) by mouth every other day for 3 doses.   levothyroxine 25 MCG tablet Commonly known as: SYNTHROID Take 25 mcg by mouth every morning.   mycophenolate 500 MG tablet Commonly known as: CELLCEPT Take 500 mg by mouth at bedtime.   pantoprazole 40 MG tablet Commonly known as: PROTONIX Take 40 mg by mouth every morning.   predniSONE 5 MG tablet Commonly known as: DELTASONE Take 5 mg by mouth every morning.   pregabalin 75 MG capsule Commonly known as: LYRICA Take 75 mg by mouth 2 (two) times daily.   REDNESS RELIEVER EYE DROPS OP Place 1 drop into both eyes daily as needed (dry eyes/irritation).   tiZANidine 4 MG tablet Commonly known as: ZANAFLEX Take 2 mg by mouth 2 (two) times daily. 1 to 2 times a day   traZODone 50 MG tablet Commonly known as: DESYREL Take 50 mg by mouth at bedtime.   Vitamin D (Ergocalciferol) 1.25 MG (50000 UNIT) Caps capsule Commonly known as: DRISDOL Take 50,000 Units  by mouth every Monday.       Follow-up Information    Beckie Salts, MD Follow up in 1 week(s).   Specialty: Internal Medicine Contact information: [address] [facility] [address] [phone]              No Known Allergies  Consultations: None none   Procedures/Studies: CT ABDOMEN PELVIS WO CONTRAST  Result Date: 07/21/2019 CLINICAL DATA:  Generalized abdominal pain for 3 days, vomiting EXAM: CT ABDOMEN AND PELVIS WITHOUT CONTRAST TECHNIQUE: Multidetector CT imaging of the abdomen and pelvis was performed following the standard protocol without IV contrast. COMPARISON:  01/15/2019 FINDINGS: Lower chest: Chronic consolidation at the left lung base favor atelectasis. Trace left pleural effusion versus pleural thickening unchanged. Right lung base is clear. Hepatobiliary: Gallbladder is distended without calcified gallstones or gallbladder wall thickening. No focal liver abnormalities. Pancreas: Unremarkable. No pancreatic ductal dilatation or surrounding inflammatory changes. Spleen: Multiple splenules are again seen within the left upper quadrant. No change. Adrenals/Urinary Tract: Postsurgical changes from left nephrectomy. No abnormalities within the nephrectomy bed.  The right kidney displaced compensatory hypertrophy. No right-sided calculi or obstruction. Bladder is decompressed which limits the evaluation. The right adrenal is unremarkable. Stomach/Bowel: No bowel obstruction or ileus. Moderate retained stool primarily seen within the transverse colon. The appendix is not identified. No inflammatory changes to suggest appendicitis. Vascular/Lymphatic: Aortic atherosclerosis. No enlarged abdominal or pelvic lymph nodes. Reproductive: Stable calcified exophytic uterine fibroid. No adnexal masses. Other: No abdominal wall hernia or abnormality. No abdominopelvic ascites. Musculoskeletal: Stable bilateral femoral head avascular necrosis. No  acute displaced fractures. There is grade 2 anterolisthesis of L4 on L5. No pars defects. Reconstructed images demonstrate no additional findings. IMPRESSION: 1. Nonspecific gallbladder distension, without evidence of cholelithiasis or wall thickening. If gallbladder pathology is suspected, right upper quadrant ultrasound could be considered. 2. Stable postsurgical changes from left nephrectomy and splenectomy, with residual splenules unchanged. 3. Stable bilateral femoral head avascular necrosis. 4. Moderate retained stool. 5. Aortic Atherosclerosis (ICD10-I70.0). Electronically Signed   By: Randa Ngo M.D.   On: 07/21/2019 17:10   MR ABDOMEN MRCP WO CONTRAST  Result Date: 07/22/2019 CLINICAL DATA:  RIGHT upper quadrant pain, nondiagnostic ultrasound EXAM: MRI ABDOMEN WITHOUT CONTRAST  (INCLUDING MRCP) TECHNIQUE: Multiplanar multisequence MR imaging of the abdomen was performed. Heavily T2-weighted images of the biliary and pancreatic ducts were obtained, and three-dimensional MRCP images were rendered by post processing. COMPARISON:  CT study 07/21/2019, CT abdomen and pelvis from January 15, 2019 FINDINGS: Lower chest: Small effusions bilaterally. Limited assessment of lung bases on MRI. Hepatobiliary: No focal, suspicious hepatic lesion. Biliary ductal dilation without sign of filling defect. Gallbladder distension with some small pericholecystic fluid but in the setting of generalized edema in the RIGHT abdomen and RIGHT retroperitoneum. There is perinephric edema on the RIGHT as well. Mild pancreatic ductal dilation on MRCP images corresponding also with biliary duct distension. Maximal caliber of the common bile duct approximately 8 mm. Maximum caliber of the pancreatic duct in the head of the pancreas approximately 3-4 mm. Pancreas: Pancreatic ductal distension. Study limited without contrast. Study also limited by motion artifact. Ductal distension is mild and there is some peripheral atrophy. There  is also mild peripancreatic edema suggested on T2 weighted images. Spleen:  Surgically absent with splenosis. Adrenals/Urinary Tract: LEFT nephrectomy. Peri nephric stranding on the RIGHT focal areas of signal variation in the kidneys with rounded appearance particularly on diffusion, for instance on image 86 of series 9 a 2.1 x 2.6 cm area of rounded diffusion related signal, patchy areas of signal abnormality elsewhere, less focal. No hydronephrosis. Adrenal glands not well evaluated given motion. No gross adrenal abnormality. Stomach/Bowel: Gastrointestinal tract with limited assessment, no acute finding. Vascular/Lymphatic: Vascular structures not well assessed given lack of intravenous contrast. No aneurysmal dilation of the abdominal aorta. Other: Small amount of fluid about the liver and gallbladder. Retroperitoneal stranding on the RIGHT. Musculoskeletal: No acute musculoskeletal process to the extent evaluated. IMPRESSION: 1. Motion limited study though no sign of biliary ductal filling defect and mild to moderate ductal dilation. 2. Persistent gallbladder distension remains nonspecific. No biliary calculi are seen. If there is continued concern for biliary pathology HIDA scan could add specificity. 3. Signs of mild heterogeneity of diffusion related signal and perinephric stranding about the RIGHT kidney. Findings are likely related to pyelonephritis. Subtle focal areas in the RIGHT kidney may represent focal nephritis. Suggest follow-up to exclude the possibility of underlying renal lesions. 4. Pancreatic ductal distension with mild peripheral atrophy and mild peripancreatic edema. Correlation with lipase is suggested.  Pancreatic ductal distension and atrophy may have been present on previous exams as a subtle finding. Suggest 3 month follow-up with MRI of the abdomen or evaluation with EUS as clinically warranted. 5. Small effusions bilaterally. 6. LEFT nephrectomy with splenosis. 7. Peri nephric  stranding on the RIGHT as well. No hydronephrosis. Electronically Signed   By: Zetta Bills M.D.   On: 07/22/2019 21:02   US Abdomen Limited RUQ  Result Date: 07/21/2019 CLINICAL DATA:  Nausea vomiting, generalized abdominal pain, abnormal CT EXAM: ULTRASOUND ABDOMEN LIMITED RIGHT UPPER QUADRANT COMPARISON:  07/21/2019 FINDINGS: Gallbladder: Gallbladder is markedly distended measuring 12.1 cm in long axis. No evidence of cholelithiasis or biliary sludge. Gallbladder wall is not thickened measuring 3 mm. There is trace pericholecystic fluid, nonspecific. Negative sonographic Murphy sign. Common bile duct: Diameter: 10 mm Liver: Liver demonstrates normal echotexture. Mild periportal edema and intrahepatic biliary duct dilation. Portal vein is patent on color Doppler imaging with normal direction of blood flow towards the liver. Other: None. IMPRESSION: 1. Dilated common bile duct, with mild periportal edema and intrahepatic biliary duct dilation. Along with distended gallbladder, this raises the possibility of downstream common bile duct pathology. ERCP or MRCP could be considered for follow-up. Please correlate with liver function tests. Electronically Signed   By: Randa Ngo M.D.   On: 07/21/2019 19:27      Discharge Exam: Vitals:   07/25/19 0416 07/25/19 0803  BP: (!) 127/56 136/74  Pulse: 94 (!) 105  Resp: 20 16  Temp: 97.9 F (36.6 C) (!) 97.5 F (36.4 C)  SpO2: 100% 99%   Vitals:   07/24/19 1728 07/24/19 2352 07/25/19 0416 07/25/19 0803  BP: (!) 169/80 131/69 (!) 127/56 136/74  Pulse: 100 (!) 101 94 (!) 105  Resp: 17 20 20 16   Temp: 98.1 F (36.7 C) 98.2 F (36.8 C) 97.9 F (36.6 C) (!) 97.5 F (36.4 C)  TempSrc: Oral Oral Oral Oral  SpO2: 100% 99% 100% 99%  Weight:      Height:        General: Pt is alert, awake, not in acute distress Cardiovascular: RRR, S1/S2 +, no rubs, no gallops Respiratory: CTA bilaterally, no wheezing, no rhonchi Abdominal: Soft, NT, ND, bowel  sounds + Extremities: no edema, no cyanosis    The results of significant diagnostics from this hospitalization (including imaging, microbiology, ancillary and laboratory) are listed below for reference.     Microbiology: Recent Results (from the past 240 hour(s))  Urine culture     Status: Abnormal   Collection Time: 07/21/19  6:49 PM   Specimen: Urine, Random  Result Value Ref Range Status   Specimen Description   Final    URINE, RANDOM Performed at [facility], [address] [place], [place] [address]    Special Requests   Final    NONE Performed at [facility], [address] [place], [place] [address]    Culture >=100,000 COLONIES/mL ESCHERICHIA COLI (A)  Final   Report Status 07/24/2019 FINAL  Final   Organism ID, Bacteria ESCHERICHIA COLI (A)  Final      Susceptibility   Escherichia coli - MIC*    AMPICILLIN >=32 RESISTANT Resistant     CEFAZOLIN <=4 SENSITIVE Sensitive     CEFTRIAXONE <=1 SENSITIVE Sensitive     CIPROFLOXACIN <=0.25 SENSITIVE Sensitive     GENTAMICIN <=1 SENSITIVE Sensitive     IMIPENEM <=0.25 SENSITIVE Sensitive     NITROFURANTOIN <=16 SENSITIVE Sensitive  TRIMETH/SULFA >=320 RESISTANT Resistant     AMPICILLIN/SULBACTAM >=32 RESISTANT Resistant     PIP/TAZO 16 SENSITIVE Sensitive     * >=100,000 COLONIES/mL ESCHERICHIA COLI  SARS Coronavirus 2 by RT PCR (hospital order, performed in [facility] hospital lab) Nasopharyngeal Nasopharyngeal Swab     Status: None   Collection Time: 07/21/19  8:08 PM   Specimen: Nasopharyngeal Swab  Result Value Ref Range Status   SARS Coronavirus 2 NEGATIVE NEGATIVE Final    Comment: (NOTE) SARS-CoV-2 target nucleic acids are NOT DETECTED.  The SARS-CoV-2 RNA is generally detectable in upper and lower respiratory specimens during the acute phase of infection. The lowest concentration of SARS-CoV-2 viral copies this assay can detect is 250 copies / mL. A negative result does not  preclude SARS-CoV-2 infection and should not be used as the sole basis for treatment or other patient management decisions.  A negative result may occur with improper specimen collection / handling, submission of specimen other than nasopharyngeal swab, presence of viral mutation(s) within the areas targeted by this assay, and inadequate number of viral copies (<250 copies / mL). A negative result must be combined with clinical observations, patient history, and epidemiological information.  Fact Sheet for Patients:   StrictlyIdeas.no  Fact Sheet for Healthcare Providers: BankingDealers.co.za  This test is not yet approved or  cleared by the [place] FDA and has been authorized for detection and/or diagnosis of SARS-CoV-2 by FDA under an Emergency Use Authorization (EUA).  This EUA will remain in effect (meaning this test can be used) for the duration of the COVID-19 declaration under Section 564(b)(1) of the Act, 21 U.S.C. section 360bbb-3(b)(1), unless the authorization is terminated or revoked sooner.  Performed at [facility], [address] [place], [place] [address]   MRSA PCR Screening     Status: None   Collection Time: 07/22/19  2:59 PM   Specimen: Nasal Mucosa; Nasopharyngeal  Result Value Ref Range Status   MRSA by PCR NEGATIVE NEGATIVE Final    Comment:        The GeneXpert MRSA Assay (FDA approved for NASAL specimens only), is one component of a comprehensive MRSA colonization surveillance program. It is not intended to diagnose MRSA infection nor to guide or monitor treatment for MRSA infections. Performed at [facility], [place] [address], [place], [place] [address]   Culture, blood (routine x 2)     Status: None (Preliminary result)   Collection Time: 07/22/19  3:32 PM   Specimen: BLOOD  Result Value Ref Range Status   Specimen Description BLOOD FOOT R  Final   Special Requests   Final     BOTTLES DRAWN AEROBIC ONLY Blood Culture results may not be optimal due to an inadequate volume of blood received in culture bottles   Culture   Final    NO GROWTH 3 DAYS Performed at [facility], [place] [address] [place], [place] [address]    Report Status PENDING  Incomplete  Culture, blood (routine x 2)     Status: None (Preliminary result)   Collection Time: 07/22/19  3:32 PM   Specimen: BLOOD  Result Value Ref Range Status   Specimen Description BLOOD  LEFT FOOT  Final   Special Requests   Final    BOTTLES DRAWN AEROBIC ONLY Blood Culture adequate volume   Culture   Final    NO GROWTH 3 DAYS Performed at [facility], [address],  [place] [address]    Report Status PENDING  Incomplete     Labs: BNP (last 3 results) No results for input(s): BNP in the last 8760 hours. Basic Metabolic Panel: Recent Labs  Lab 07/21/19 1740 07/22/19 1415 07/23/19 0718 07/24/19 0619 07/24/19 1410 07/25/19 0721  NA 139 139 139 140  --  141  K 4.1 4.5 4.5 3.8  --  3.5  CL 114* 114* 115* 106  --  99  CO2 16* 16* 16* 22  --  29  GLUCOSE 69* 83 79 96  --  124*  BUN 41* 36* 33* 27*  --  21  CREATININE 3.54* 3.86* 3.75* 3.28*  --  2.89*  CALCIUM 7.6* 7.8* 7.5* 7.7*  --  7.4*  MG  --  1.1* 1.2*  --  1.2*  --    Liver Function Tests: Recent Labs  Lab 07/21/19 1740 07/22/19 1415 07/23/19 0718 07/24/19 0619 07/25/19 0721  AST 13* 15 16 19 20   ALT 15 15 14 17 15   ALKPHOS 80 83 73 71 74  BILITOT 0.5 0.5 0.5 0.4 0.6  PROT 4.9* 5.2* 4.5* 5.1* 4.9*  ALBUMIN 2.9* 2.8* 2.4* 2.8* 2.7*   Recent Labs  Lab 07/21/19 1740  LIPASE 19   No results for input(s): AMMONIA in the last 168 hours. CBC: Recent Labs  Lab 07/21/19 1740 07/22/19 1415 07/23/19 0718 07/24/19 0619 07/25/19 0721  WBC 16.3* 14.4* 10.7* 10.6* 6.8  NEUTROABS 13.6* 11.2*  --  7.6 3.9  HGB 7.8* 8.9* 7.6* 8.0* 7.7*  HCT 25.7* 29.7* 26.3* 26.3* 25.4*  MCV 73.9* 75.8* 76.2* 73.5* 72.4*  PLT 214  205 166 149* 142*   Cardiac Enzymes: No results for input(s): CKTOTAL, CKMB, CKMBINDEX, TROPONINI in the last 168 hours. BNP: Invalid input(s): POCBNP CBG: Recent Labs  Lab 07/24/19 1057 07/24/19 1625 07/24/19 1955 07/24/19 2353 07/25/19 0414  GLUCAP 97 107* 102* 94 83   D-Dimer No results for input(s): DDIMER in the last 72 hours. Hgb A1c No results for input(s): HGBA1C in the last 72 hours. Lipid Profile No results for input(s): CHOL, HDL, LDLCALC, TRIG, CHOLHDL, LDLDIRECT in the last 72 hours. Thyroid function studies Recent Labs    07/22/19 1415  TSH 7.892*   Anemia work up No results for input(s): VITAMINB12, FOLATE, FERRITIN, TIBC, IRON, RETICCTPCT in the last 72 hours. Urinalysis    Component Value Date/Time   COLORURINE YELLOW 07/21/2019 1849   APPEARANCEUR CLOUDY (A) 07/21/2019 1849   LABSPEC 1.015 07/21/2019 1849   PHURINE 6.0 07/21/2019 1849   GLUCOSEU NEGATIVE 07/21/2019 1849   HGBUR LARGE (A) 07/21/2019 1849   BILIRUBINUR NEGATIVE 07/21/2019 [place] 07/21/2019 1849   PROTEINUR 30 (A) 07/21/2019 1849   NITRITE POSITIVE (A) 07/21/2019 1849   LEUKOCYTESUR MODERATE (A) 07/21/2019 1849   Sepsis Labs Invalid input(s): PROCALCITONIN,  WBC,  LACTICIDVEN Microbiology Recent Results (from the past 240 hour(s))  Urine culture     Status: Abnormal   Collection Time: 07/21/19  6:49 PM   Specimen: Urine, Random  Result Value Ref Range Status   Specimen Description   Final    URINE, RANDOM Performed at [facility], [address] [place], [place] [address]    Special Requests   Final    NONE Performed at [facility], [address] [place], [place] [address]    Culture >=100,000 COLONIES/mL ESCHERICHIA COLI (A)  Final   Report Status 07/24/2019 FINAL  Final  Organism ID, Bacteria ESCHERICHIA COLI (A)  Final      Susceptibility   Escherichia coli - MIC*    AMPICILLIN >=32 RESISTANT Resistant     CEFAZOLIN <=4  SENSITIVE Sensitive     CEFTRIAXONE <=1 SENSITIVE Sensitive     CIPROFLOXACIN <=0.25 SENSITIVE Sensitive     GENTAMICIN <=1 SENSITIVE Sensitive     IMIPENEM <=0.25 SENSITIVE Sensitive     NITROFURANTOIN <=16 SENSITIVE Sensitive     TRIMETH/SULFA >=320 RESISTANT Resistant     AMPICILLIN/SULBACTAM >=32 RESISTANT Resistant     PIP/TAZO 16 SENSITIVE Sensitive     * >=100,000 COLONIES/mL ESCHERICHIA COLI  SARS Coronavirus 2 by RT PCR (hospital order, performed in [facility] hospital lab) Nasopharyngeal Nasopharyngeal Swab     Status: None   Collection Time: 07/21/19  8:08 PM   Specimen: Nasopharyngeal Swab  Result Value Ref Range Status   SARS Coronavirus 2 NEGATIVE NEGATIVE Final    Comment: (NOTE) SARS-CoV-2 target nucleic acids are NOT DETECTED.  The SARS-CoV-2 RNA is generally detectable in upper and lower respiratory specimens during the acute phase of infection. The lowest concentration of SARS-CoV-2 viral copies this assay can detect is 250 copies / mL. A negative result does not preclude SARS-CoV-2 infection and should not be used as the sole basis for treatment or other patient management decisions.  A negative result may occur with improper specimen collection / handling, submission of specimen other than nasopharyngeal swab, presence of viral mutation(s) within the areas targeted by this assay, and inadequate number of viral copies (<250 copies / mL). A negative result must be combined with clinical observations, patient history, and epidemiological information.  Fact Sheet for Patients:   StrictlyIdeas.no  Fact Sheet for Healthcare Providers: BankingDealers.co.za  This test is not yet approved or  cleared by the [place] FDA and has been authorized for detection and/or diagnosis of SARS-CoV-2 by FDA under an Emergency Use Authorization (EUA).  This EUA will remain in effect (meaning this test can be used) for the  duration of the COVID-19 declaration under Section 564(b)(1) of the Act, 21 U.S.C. section 360bbb-3(b)(1), unless the authorization is terminated or revoked sooner.  Performed at [facility], [address] [place], [place] [address]   MRSA PCR Screening     Status: None   Collection Time: 07/22/19  2:59 PM   Specimen: Nasal Mucosa; Nasopharyngeal  Result Value Ref Range Status   MRSA by PCR NEGATIVE NEGATIVE Final    Comment:        The GeneXpert MRSA Assay (FDA approved for NASAL specimens only), is one component of a comprehensive MRSA colonization surveillance program. It is not intended to diagnose MRSA infection nor to guide or monitor treatment for MRSA infections. Performed at [facility], [place] [address] [place], [place] [address]   Culture, blood (routine x 2)     Status: None (Preliminary result)   Collection Time: 07/22/19  3:32 PM   Specimen: BLOOD  Result Value Ref Range Status   Specimen Description BLOOD FOOT R  Final   Special Requests   Final    BOTTLES DRAWN AEROBIC ONLY Blood Culture results may not be optimal due to an inadequate volume of blood received in culture bottles   Culture   Final    NO GROWTH 3 DAYS Performed at [facility], [place] [address], [place], [place] [address]    Report Status PENDING  Incomplete  Culture, blood (routine x  2)     Status: None (Preliminary result)   Collection Time: 07/22/19  3:32 PM   Specimen: BLOOD  Result Value Ref Range Status   Specimen Description BLOOD  LEFT FOOT  Final   Special Requests   Final    BOTTLES DRAWN AEROBIC ONLY Blood Culture adequate volume   Culture   Final    NO GROWTH 3 DAYS Performed at [facility], [address], [place], [place] [address]    Report Status PENDING  Incomplete     Time coordinating discharge: Over 30 minutes  SIGNED:   Darliss Cheney, MD  [facility] 07/25/2019, 10:40 AM  If 7PM-7AM, please contact  night-coverage [email or web address]

## 2019-07-25 NOTE — Care Management (Signed)
Discussed home health services with patient. Patient declined.   Magdalen Spatz RN

## 2019-07-25 NOTE — Discharge Instructions (Signed)
Pyelonephritis, Adult  Pyelonephritis is an infection that occurs in the kidney. The kidneys are organs that help clean the blood by moving waste out of the blood and into the pee (urine). This infection can happen quickly, or it can last for a long time. In most cases, it clears up with treatment and does not cause other problems. What are the causes? This condition may be caused by:  Germs (bacteria) going from the bladder up to the kidney. This may happen after having a bladder infection.  Germs going from the blood to the kidney. What increases the risk? This condition is more likely to develop in:  Pregnant women.  Older people.  People who have any of these conditions: ? Diabetes. ? Inflammation of the prostate gland (prostatitis), in males. ? Kidney stones or bladder stones. ? Other problems with the kidney or the parts of your body that carry pee from the kidneys to the bladder (ureters). ? Cancer.  People who have a small, thin tube (catheter) placed in the bladder.  People who are sexually active.  Women who use a medicine that kills sperm (spermicide) to prevent pregnancy.  People who have had a prior urinary tract infection (UTI). What are the signs or symptoms? Symptoms of this condition include:  Peeing often.  A strong urge to pee right away.  Burning or stinging when peeing.  Belly pain.  Back pain.  Pain in the side (flank area).  Fever or chills.  Blood in the pee, or dark pee.  Feeling sick to your stomach (nauseous) or throwing up (vomiting). How is this treated? This condition may be treated by:  Taking antibiotic medicines by mouth (orally).  Drinking enough fluids. If the infection is bad, you may need to stay in the hospital. You may be given antibiotics and fluids that are put directly into a vein through an IV tube. In some cases, other treatments may be needed. Follow these instructions at home: Medicines  Take your antibiotic  medicine as told by your doctor. Do not stop taking the antibiotic even if you start to feel better.  Take over-the-counter and prescription medicines only as told by your doctor. General instructions   Drink enough fluid to keep your pee pale yellow.  Avoid caffeine, tea, and carbonated drinks.  Pee (urinate) often. Avoid holding in pee for long periods of time.  Pee before and after sex.  After pooping (having a bowel movement), women should wipe from front to back. Use each tissue only once.  Keep all follow-up visits as told by your doctor. This is important. Contact a doctor if:  You do not feel better after 2 days.  Your symptoms get worse.  You have a fever. Get help right away if:  You cannot take your medicine or drink fluids as told.  You have chills and shaking.  You throw up.  You have very bad pain in your side or back.  You feel very weak or you pass out (faint). Summary  Pyelonephritis is an infection that occurs in the kidney.  In most cases, this infection clears up with treatment and does not cause other problems.  Take your antibiotic medicine as told by your doctor. Do not stop taking the antibiotic even if you start to feel better.  Drink enough fluid to keep your pee pale yellow. This information is not intended to replace advice given to you by your health care provider. Make sure you discuss any questions you have with  your health care provider. Document Revised: 12/01/2017 Document Reviewed: 12/01/2017 [employer] Patient Education  2020 [employer]  

## 2019-07-27 LAB — CULTURE, BLOOD (ROUTINE X 2)
Culture: NO GROWTH
Culture: NO GROWTH
Special Requests: ADEQUATE

## 2021-12-11 DEATH — deceased
